# Patient Record
Sex: Female | Born: 1955
Health system: Southern US, Community
[De-identification: ages and names within clinical notes are randomized; demographics above are authoritative.]

## PROBLEM LIST (undated history)

## (undated) DIAGNOSIS — Z01419 Encounter for gynecological examination (general) (routine) without abnormal findings: Secondary | ICD-10-CM

## (undated) DIAGNOSIS — E785 Hyperlipidemia, unspecified: Secondary | ICD-10-CM

## (undated) DIAGNOSIS — H269 Unspecified cataract: Secondary | ICD-10-CM

## (undated) DIAGNOSIS — K219 Gastro-esophageal reflux disease without esophagitis: Secondary | ICD-10-CM

## (undated) DIAGNOSIS — I10 Essential (primary) hypertension: Secondary | ICD-10-CM

## (undated) DIAGNOSIS — E039 Hypothyroidism, unspecified: Secondary | ICD-10-CM

## (undated) DIAGNOSIS — I209 Angina pectoris, unspecified: Secondary | ICD-10-CM

## (undated) DIAGNOSIS — M1990 Unspecified osteoarthritis, unspecified site: Secondary | ICD-10-CM

## (undated) DIAGNOSIS — J449 Chronic obstructive pulmonary disease, unspecified: Secondary | ICD-10-CM

## (undated) DIAGNOSIS — I219 Acute myocardial infarction, unspecified: Secondary | ICD-10-CM

## (undated) DIAGNOSIS — D649 Anemia, unspecified: Secondary | ICD-10-CM

## (undated) DIAGNOSIS — T7840XA Allergy, unspecified, initial encounter: Secondary | ICD-10-CM

## (undated) DIAGNOSIS — J45909 Unspecified asthma, uncomplicated: Secondary | ICD-10-CM

## (undated) DIAGNOSIS — G43909 Migraine, unspecified, not intractable, without status migrainosus: Secondary | ICD-10-CM

## (undated) DIAGNOSIS — Z9889 Other specified postprocedural states: Secondary | ICD-10-CM

## (undated) DIAGNOSIS — I2510 Atherosclerotic heart disease of native coronary artery without angina pectoris: Secondary | ICD-10-CM

## (undated) HISTORY — DX: Encounter for gynecological examination (general) (routine) without abnormal findings: Z01.419

## (undated) HISTORY — DX: Allergy, unspecified, initial encounter: T78.40XA

## (undated) HISTORY — DX: Gastro-esophageal reflux disease without esophagitis: K21.9

## (undated) HISTORY — DX: Hypothyroidism, unspecified: E03.9

## (undated) HISTORY — DX: Essential (primary) hypertension: I10

## (undated) HISTORY — DX: Other specified postprocedural states: Z98.890

## (undated) HISTORY — PX: TONSILLECTOMY: SUR1361

## (undated) HISTORY — DX: Hyperlipidemia, unspecified: E78.5

## (undated) HISTORY — PX: HERNIA REPAIR: SHX51

## (undated) HISTORY — DX: Atherosclerotic heart disease of native coronary artery without angina pectoris: I25.10

## (undated) HISTORY — DX: Unspecified asthma, uncomplicated: J45.909

## (undated) HISTORY — DX: Unspecified cataract: H26.9

## (undated) HISTORY — PX: UPPER GASTROINTESTINAL ENDOSCOPY: SHX188

## (undated) HISTORY — DX: Anemia, unspecified: D64.9

## (undated) HISTORY — PX: THYROIDECTOMY: SHX17

## (undated) HISTORY — DX: Unspecified osteoarthritis, unspecified site: M19.90

---

## 1999-01-29 DIAGNOSIS — D649 Anemia, unspecified: Secondary | ICD-10-CM

## 1999-01-29 HISTORY — DX: Anemia, unspecified: D64.9

## 2003-12-28 ENCOUNTER — Ambulatory Visit: Payer: Self-pay | Admitting: Family Medicine

## 2004-09-27 ENCOUNTER — Ambulatory Visit: Payer: Self-pay | Admitting: Family Medicine

## 2004-10-01 ENCOUNTER — Inpatient Hospital Stay (HOSPITAL_COMMUNITY): Admission: EM | Admit: 2004-10-01 | Discharge: 2004-10-04 | Payer: Self-pay | Admitting: Emergency Medicine

## 2004-10-24 ENCOUNTER — Ambulatory Visit: Payer: Self-pay | Admitting: Family Medicine

## 2004-12-06 ENCOUNTER — Other Ambulatory Visit: Admission: RE | Admit: 2004-12-06 | Discharge: 2004-12-06 | Payer: Self-pay | Admitting: Obstetrics and Gynecology

## 2005-01-18 ENCOUNTER — Encounter: Admission: RE | Admit: 2005-01-18 | Discharge: 2005-01-18 | Payer: Self-pay | Admitting: Obstetrics and Gynecology

## 2005-10-24 ENCOUNTER — Ambulatory Visit: Payer: Self-pay | Admitting: Family Medicine

## 2005-11-06 ENCOUNTER — Ambulatory Visit: Payer: Self-pay | Admitting: Family Medicine

## 2005-11-08 ENCOUNTER — Ambulatory Visit: Payer: Self-pay | Admitting: Cardiology

## 2005-12-10 ENCOUNTER — Other Ambulatory Visit: Admission: RE | Admit: 2005-12-10 | Discharge: 2005-12-10 | Payer: Self-pay | Admitting: Obstetrics and Gynecology

## 2006-01-24 ENCOUNTER — Encounter: Admission: RE | Admit: 2006-01-24 | Discharge: 2006-01-24 | Payer: Self-pay | Admitting: Obstetrics and Gynecology

## 2006-07-20 ENCOUNTER — Emergency Department (HOSPITAL_COMMUNITY): Admission: EM | Admit: 2006-07-20 | Discharge: 2006-07-20 | Payer: Self-pay | Admitting: Family Medicine

## 2006-09-25 DIAGNOSIS — E039 Hypothyroidism, unspecified: Secondary | ICD-10-CM | POA: Insufficient documentation

## 2006-09-25 DIAGNOSIS — E7800 Pure hypercholesterolemia, unspecified: Secondary | ICD-10-CM | POA: Insufficient documentation

## 2006-09-25 DIAGNOSIS — K219 Gastro-esophageal reflux disease without esophagitis: Secondary | ICD-10-CM | POA: Insufficient documentation

## 2006-09-25 DIAGNOSIS — J309 Allergic rhinitis, unspecified: Secondary | ICD-10-CM | POA: Insufficient documentation

## 2006-09-25 DIAGNOSIS — I10 Essential (primary) hypertension: Secondary | ICD-10-CM | POA: Insufficient documentation

## 2006-11-10 ENCOUNTER — Ambulatory Visit: Payer: Self-pay | Admitting: Family Medicine

## 2006-11-10 DIAGNOSIS — R519 Headache, unspecified: Secondary | ICD-10-CM | POA: Insufficient documentation

## 2006-11-10 DIAGNOSIS — R51 Headache: Secondary | ICD-10-CM | POA: Insufficient documentation

## 2006-11-10 DIAGNOSIS — N951 Menopausal and female climacteric states: Secondary | ICD-10-CM | POA: Insufficient documentation

## 2006-11-10 DIAGNOSIS — D649 Anemia, unspecified: Secondary | ICD-10-CM | POA: Insufficient documentation

## 2006-11-12 LAB — CONVERTED CEMR LAB
Alkaline Phosphatase: 74 units/L (ref 39–117)
BUN: 7 mg/dL (ref 6–23)
Basophils Absolute: 0.1 10*3/uL (ref 0.0–0.1)
CO2: 30 meq/L (ref 19–32)
Cholesterol: 162 mg/dL (ref 0–200)
FSH: 38.2 milliintl units/mL
GFR calc Af Amer: 86 mL/min
HDL: 49.9 mg/dL (ref >39.0)
Hemoglobin: 13.9 g/dL (ref 12.0–15.0)
LH: 18.6 milliintl units/mL
Lymphocytes Relative: 25.4 % (ref 12.0–46.0)
MCHC: 35.7 g/dL (ref 30.0–36.0)
Monocytes Absolute: 0.6 10*3/uL (ref 0.2–0.7)
Monocytes Relative: 8.7 % (ref 3.0–11.0)
Neutro Abs: 4 10*3/uL (ref 1.4–7.7)
Potassium: 4.6 meq/L (ref 3.5–5.1)
Sodium: 145 meq/L (ref 135–145)
TSH: 0.53 microintl units/mL (ref 0.35–5.50)
Total Protein: 6.4 g/dL (ref 6.0–8.3)

## 2006-11-18 ENCOUNTER — Ambulatory Visit: Payer: Self-pay | Admitting: Cardiovascular Disease

## 2006-12-18 ENCOUNTER — Other Ambulatory Visit: Admission: RE | Admit: 2006-12-18 | Discharge: 2006-12-18 | Payer: Self-pay | Admitting: Obstetrics and Gynecology

## 2007-01-25 ENCOUNTER — Emergency Department (HOSPITAL_COMMUNITY): Admission: EM | Admit: 2007-01-25 | Discharge: 2007-01-25 | Payer: Self-pay | Admitting: Emergency Medicine

## 2007-01-29 HISTORY — PX: CARDIAC CATHETERIZATION: SHX172

## 2007-03-16 ENCOUNTER — Encounter: Admission: RE | Admit: 2007-03-16 | Discharge: 2007-03-16 | Payer: Self-pay | Admitting: Obstetrics and Gynecology

## 2007-07-22 ENCOUNTER — Other Ambulatory Visit: Payer: Self-pay

## 2007-07-22 ENCOUNTER — Encounter: Payer: Self-pay | Admitting: Family Medicine

## 2007-07-23 ENCOUNTER — Encounter: Payer: Self-pay | Admitting: Family Medicine

## 2007-07-23 ENCOUNTER — Inpatient Hospital Stay: Payer: Self-pay | Admitting: Internal Medicine

## 2007-07-24 ENCOUNTER — Encounter: Payer: Self-pay | Admitting: Family Medicine

## 2007-07-24 DIAGNOSIS — Z9889 Other specified postprocedural states: Secondary | ICD-10-CM

## 2007-07-24 HISTORY — DX: Other specified postprocedural states: Z98.890

## 2007-07-30 ENCOUNTER — Ambulatory Visit: Payer: Self-pay | Admitting: Family Medicine

## 2007-07-30 DIAGNOSIS — I2510 Atherosclerotic heart disease of native coronary artery without angina pectoris: Secondary | ICD-10-CM | POA: Insufficient documentation

## 2007-08-04 LAB — CONVERTED CEMR LAB: TSH: 0.58 microintl units/mL (ref 0.35–5.50)

## 2007-10-08 ENCOUNTER — Ambulatory Visit: Payer: Self-pay | Admitting: Obstetrics and Gynecology

## 2007-10-13 ENCOUNTER — Ambulatory Visit: Payer: Self-pay | Admitting: Obstetrics and Gynecology

## 2008-02-24 ENCOUNTER — Telehealth: Payer: Self-pay | Admitting: Family Medicine

## 2008-04-21 ENCOUNTER — Encounter: Admission: RE | Admit: 2008-04-21 | Discharge: 2008-04-21 | Payer: Self-pay | Admitting: Obstetrics and Gynecology

## 2008-07-18 ENCOUNTER — Emergency Department (HOSPITAL_COMMUNITY): Admission: EM | Admit: 2008-07-18 | Discharge: 2008-07-18 | Payer: Self-pay | Admitting: Emergency Medicine

## 2008-08-30 ENCOUNTER — Ambulatory Visit: Payer: Self-pay | Admitting: Family Medicine

## 2008-08-30 LAB — CONVERTED CEMR LAB
Blood in Urine, dipstick: NEGATIVE
Nitrite: NEGATIVE
Protein, U semiquant: NEGATIVE
Specific Gravity, Urine: 1.02
Urobilinogen, UA: 0.2
WBC Urine, dipstick: NEGATIVE

## 2008-09-01 LAB — CONVERTED CEMR LAB
AST: 18 units/L (ref 0–37)
Albumin: 4.1 g/dL (ref 3.5–5.2)
Alkaline Phosphatase: 63 units/L (ref 39–117)
Basophils Relative: 1.1 % (ref 0.0–3.0)
Bilirubin, Direct: 0.1 mg/dL (ref 0.0–0.3)
Calcium: 8.8 mg/dL (ref 8.4–10.5)
Creatinine, Ser: 1 mg/dL (ref 0.4–1.2)
GFR calc non Af Amer: 62.22 mL/min (ref >60)
HDL: 39.5 mg/dL (ref >39.00)
Hemoglobin: 14 g/dL (ref 12.0–15.0)
LDL Cholesterol: 89 mg/dL (ref 0–99)
Lymphocytes Relative: 32.4 % (ref 12.0–46.0)
Monocytes Relative: 9.6 % (ref 3.0–12.0)
Neutro Abs: 2.5 10*3/uL (ref 1.4–7.7)
Neutrophils Relative %: 53.9 % (ref 43.0–77.0)
RBC: 4.7 M/uL (ref 3.87–5.11)
Sodium: 143 meq/L (ref 135–145)
Total CHOL/HDL Ratio: 4
Total Protein: 7 g/dL (ref 6.0–8.3)
Triglycerides: 113 mg/dL (ref 0.0–149.0)
VLDL: 22.6 mg/dL (ref 0.0–40.0)
WBC: 4.5 10*3/uL (ref 4.5–10.5)

## 2008-09-12 ENCOUNTER — Ambulatory Visit: Payer: Self-pay | Admitting: Family Medicine

## 2008-09-12 DIAGNOSIS — D179 Benign lipomatous neoplasm, unspecified: Secondary | ICD-10-CM | POA: Insufficient documentation

## 2008-09-21 ENCOUNTER — Ambulatory Visit: Payer: Self-pay | Admitting: Internal Medicine

## 2008-09-27 ENCOUNTER — Encounter: Payer: Self-pay | Admitting: Family Medicine

## 2008-10-06 ENCOUNTER — Ambulatory Visit: Payer: Self-pay | Admitting: Internal Medicine

## 2008-10-06 ENCOUNTER — Encounter: Payer: Self-pay | Admitting: Internal Medicine

## 2008-10-06 HISTORY — PX: COLONOSCOPY: SHX174

## 2008-10-06 LAB — HM COLONOSCOPY

## 2008-10-07 ENCOUNTER — Encounter: Payer: Self-pay | Admitting: Internal Medicine

## 2008-11-02 ENCOUNTER — Encounter: Admission: RE | Admit: 2008-11-02 | Discharge: 2008-11-02 | Payer: Self-pay | Admitting: General Surgery

## 2008-11-28 ENCOUNTER — Encounter (INDEPENDENT_AMBULATORY_CARE_PROVIDER_SITE_OTHER): Payer: Self-pay | Admitting: *Deleted

## 2009-04-11 ENCOUNTER — Telehealth: Payer: Self-pay | Admitting: Family Medicine

## 2009-04-24 ENCOUNTER — Encounter: Admission: RE | Admit: 2009-04-24 | Discharge: 2009-04-24 | Payer: Self-pay | Admitting: Obstetrics and Gynecology

## 2009-05-12 ENCOUNTER — Emergency Department (HOSPITAL_COMMUNITY): Admission: EM | Admit: 2009-05-12 | Discharge: 2009-05-12 | Payer: Self-pay | Admitting: Emergency Medicine

## 2009-05-26 ENCOUNTER — Ambulatory Visit: Payer: Self-pay | Admitting: Obstetrics and Gynecology

## 2009-05-26 ENCOUNTER — Other Ambulatory Visit: Admission: RE | Admit: 2009-05-26 | Discharge: 2009-05-26 | Payer: Self-pay | Admitting: Obstetrics and Gynecology

## 2009-09-15 ENCOUNTER — Ambulatory Visit: Payer: Self-pay | Admitting: Family Medicine

## 2009-09-15 LAB — CONVERTED CEMR LAB
Ketones, urine, test strip: NEGATIVE
Nitrite: NEGATIVE
Protein, U semiquant: NEGATIVE
Urobilinogen, UA: 0.2

## 2009-09-18 LAB — CONVERTED CEMR LAB
ALT: 35 units/L (ref 0–35)
AST: 26 units/L (ref 0–37)
Alkaline Phosphatase: 75 units/L (ref 39–117)
BUN: 14 mg/dL (ref 6–23)
Basophils Relative: 0.9 % (ref 0.0–3.0)
Bilirubin, Direct: 0.1 mg/dL (ref 0.0–0.3)
Calcium: 8.8 mg/dL (ref 8.4–10.5)
Chloride: 104 meq/L (ref 96–112)
Cholesterol: 185 mg/dL (ref 0–200)
Creatinine, Ser: 1.1 mg/dL (ref 0.4–1.2)
Eosinophils Relative: 4.2 % (ref 0.0–5.0)
GFR calc non Af Amer: 58.57 mL/min (ref >60)
LDL Cholesterol: 112 mg/dL — ABNORMAL HIGH (ref 0–99)
Lymphocytes Relative: 27.5 % (ref 12.0–46.0)
Monocytes Absolute: 0.6 10*3/uL (ref 0.1–1.0)
Monocytes Relative: 9.1 % (ref 3.0–12.0)
Neutrophils Relative %: 58.3 % (ref 43.0–77.0)
Platelets: 227 10*3/uL (ref 150.0–400.0)
RBC: 4.42 M/uL (ref 3.87–5.11)
Total Bilirubin: 0.5 mg/dL (ref 0.3–1.2)
Total CHOL/HDL Ratio: 3
Total Protein: 7 g/dL (ref 6.0–8.3)
Triglycerides: 100 mg/dL (ref 0.0–149.0)
VLDL: 20 mg/dL (ref 0.0–40.0)
WBC: 6.1 10*3/uL (ref 4.5–10.5)

## 2009-09-25 ENCOUNTER — Ambulatory Visit: Payer: Self-pay | Admitting: Family Medicine

## 2010-02-18 ENCOUNTER — Encounter: Payer: Self-pay | Admitting: Family Medicine

## 2010-02-18 ENCOUNTER — Encounter: Payer: Self-pay | Admitting: Obstetrics and Gynecology

## 2010-02-27 NOTE — Progress Notes (Signed)
Summary: Pt req refill of Restoril 5m to [facility] Phone Note Call from Patient Call back at Home Phone [phone]  Caller: Patient Summary of Call: Pt req refill of Restoril 3104mto [facility] Initial call taken by: CaBraulio Bosch April 11, 2009 2:59 PM  Follow-up for Phone Call        done Follow-up by: StLaurey MoraleD,  April 12, 2009 8:36 AM  Additional Follow-up for Phone Call Additional follow up Details #1::        [facility] Follow-up by: JuChipper OmanRN,  April 12, 2009 8:48 AM    Prescriptions: RESTORIL 30 MG  CAPS (TEMAZEPAM) 1 by mouth once daily  #90 x 1   Entered and Authorized by:   StLaurey MoraleD   Signed by:   StLaurey MoraleD on 04/12/2009   Method used:   Print then Give to Patient   RxID:   [id number]

## 2010-02-27 NOTE — Assessment & Plan Note (Signed)
Summary: CPX // RS   Vital Signs:  Patient profile:   55 year old female Weight:      294 pounds BMI:     45.53 BP sitting:   110 / 80  (left arm) Cuff size:   large  Vitals Entered By: Chipper Oman, RN (September 25, 2009 2:39 PM) CC: CPX, labs done. Sees gyn. C/o tingling R fingers and feels tired.   History of Present Illness: 55 yr old female for a cpx. She feels well except for some mild fatigue. She admits to getting no exercise at all.   Allergies: 1)  ! Sulfamethoxazole (Sulfamethoxazole)  Past History:  Past Medical History: Reviewed history from 09/12/2008 and no changes required. Allergic rhinitis, gets shots per Dr. Caprice Red GERD Hyperlipidemia Hypertension Hypothyroidism Anemia-NOS Headache Coronary artery disease, had MI in 2001, sees Dr[facility] Fletcher [facility] at [facility] cardiac cath 07-24-07 showed nonocclusive disease sees Dr. Cherylann Banas for GYN exams  Past Surgical History: Reviewed history from 11/10/2006 and no changes required. Caesarean section 1984  Ventral Hernia repair  Thyroidectomy Tonsillectomy  Family History: Reviewed history from 11/10/2006 and no changes required. Family History Breast cancer 1st degree relative <50 Family History of Colon CA 1st degree relative <60 Family History High cholesterol Family History Hypertension Family History Lung cancer Family History of Prostate CA 1st degree relative <50 Family History of Stroke F 1st degree relative <60 Family History of Cardiovascular disorder Family History of ETOH  Social History: Reviewed history from 11/10/2006 and no changes required. Divorced Former Smoker Alcohol use-no Drug use-no  Review of Systems  The patient denies anorexia, fever, weight loss, vision loss, decreased hearing, hoarseness, chest pain, syncope, dyspnea on exertion, peripheral edema, prolonged cough, headaches, hemoptysis, abdominal pain, melena, hematochezia, severe indigestion/heartburn,  hematuria, incontinence, genital sores, muscle weakness, suspicious skin lesions, transient blindness, difficulty walking, depression, unusual weight change, abnormal bleeding, enlarged lymph nodes, angioedema, breast masses, and testicular masses.    Physical Exam  General:  overweight-appearing.   Head:  Normocephalic and atraumatic without obvious abnormalities. No apparent alopecia or balding. Eyes:  No corneal or conjunctival inflammation noted. EOMI. Perrla. Funduscopic exam benign, without hemorrhages, exudates or papilledema. Vision grossly normal. Ears:  External ear exam shows no significant lesions or deformities.  Otoscopic examination reveals clear canals, tympanic membranes are intact bilaterally without bulging, retraction, inflammation or discharge. Hearing is grossly normal bilaterally. Nose:  External nasal examination shows no deformity or inflammation. Nasal mucosa are pink and moist without lesions or exudates. Mouth:  Oral mucosa and oropharynx without lesions or exudates.  Teeth in good repair. Neck:  No deformities, masses, or tenderness noted. Chest Wall:  No deformities, masses, or tenderness noted. Lungs:  Normal respiratory effort, chest expands symmetrically. Lungs are clear to auscultation, no crackles or wheezes. Heart:  Normal rate and regular rhythm. S1 and S2 normal without gallop, murmur, click, rub or other extra sounds. EKG normal Abdomen:  Bowel sounds positive,abdomen soft and non-tender without masses, organomegaly or hernias noted. Msk:  No deformity or scoliosis noted of thoracic or lumbar spine.   Pulses:  R and L carotid,radial,femoral,dorsalis pedis and posterior tibial pulses are full and equal bilaterally Extremities:  No clubbing, cyanosis, edema, or deformity noted with normal full range of motion of all joints.   Neurologic:  No cranial nerve deficits noted. Station and gait are normal. Plantar reflexes are down-going bilaterally. DTRs are  symmetrical throughout. Sensory, motor and coordinative functions appear intact. Skin:  Intact without suspicious  lesions or rashes Cervical Nodes:  No lymphadenopathy noted Axillary Nodes:  No palpable lymphadenopathy Inguinal Nodes:  No significant adenopathy Psych:  Cognition and judgment appear intact. Alert and cooperative with normal attention span and concentration. No apparent delusions, illusions, hallucinations   Impression & Recommendations:  Problem # 1:  EXAMINATION, ROUTINE MEDICAL (ICD-V70.0)  Orders: EKG w/ Interpretation (93000)  Complete Medication List: 1)  Lipitor 20 Mg Tabs (Atorvastatin calcium) .... Take 1 tablet by mouth once a day 2)  Allegra 180 Mg Tabs (Fexofenadine hcl) .... Take 1 tablet by mouth once a day 3)  Singulair 10 Mg Tabs (Montelukast sodium) .... Take 1 tablet by mouth once a day 4)  Vitamin E Natural 400 Unit Caps (Vitamin e) .Marland Kitchen.. 1 by mouth once daily 5)  Aspirin 325 Mg Tbec (Aspirin) .... Once daily 6)  Calcium Antacid Ultra 1000 Mg Chew (Calcium carbonate antacid) .... Once daily 7)  Nexium 40 Mg Cpdr (Esomeprazole magnesium) .... Take 1 capsule by mouth once a day 8)  Nasacort Aq 55 Mcg/act Aers (Triamcinolone acetonide(nasal)) .... As needed 9)  Restoril 30 Mg Caps (Temazepam) .Marland Kitchen.. 1 by mouth once daily 10)  Prozac 40 Mg Caps (Fluoxetine hcl) .... Once daily 11)  Synthroid 200 Mcg Tabs (Levothyroxine sodium) .Marland Kitchen.. 1 by mouth once daily 12)  Cozaar 100 Mg Tabs (Losartan potassium) .... Once daily 13)  Proair Hfa 108 (90 Base) Mcg/act Aers (Albuterol sulfate) .... 2 inh q4h as needed shortness of breath  Patient Instructions: 1)  It is important that you exercise reguarly at least 20 minutes 5 times a week. If you develop chest pain, have severe difficulty breathing, or feel very tired, stop exercising immediately and seek medical attention.  2)  You need to lose weight. Consider a lower calorie diet and regular exercise.  3)  Please  schedule a follow-up appointment in 6 months .  Prescriptions: PROAIR HFA 108 (90 BASE) MCG/ACT  AERS (ALBUTEROL SULFATE) 2 inh q4h as needed shortness of breath  #3 x 3   Entered and Authorized by:   Laurey Morale MD   Signed by:   Laurey Morale MD on 09/25/2009   Method used:   Print then Give to Patient   RxID:   [id number] COZAAR 100 MG  TABS (LOSARTAN POTASSIUM) once daily  #90 x 3   Entered and Authorized by:   Laurey Morale MD   Signed by:   Laurey Morale MD on 09/25/2009   Method used:   Print then Give to Patient   RxID:   [id number] SYNTHROID 200 MCG TABS (LEVOTHYROXINE SODIUM) 1 by mouth once daily  #90 x 3   Entered and Authorized by:   Laurey Morale MD   Signed by:   Laurey Morale MD on 09/25/2009   Method used:   Print then Give to Patient   RxID:   [id number] PROZAC 40 MG  CAPS (FLUOXETINE HCL) once daily  #90 x 3   Entered and Authorized by:   Laurey Morale MD   Signed by:   Laurey Morale MD on 09/25/2009   Method used:   Print then Give to Patient   RxID:   [id number] RESTORIL 30 MG  CAPS (TEMAZEPAM) 1 by mouth once daily  #90 x 1   Entered and Authorized by:   Laurey Morale MD   Signed by:   Laurey Morale MD on 09/25/2009   Method used:   Print then  Give to Patient   RxID:   [id number] NEXIUM 40 MG  CPDR (ESOMEPRAZOLE MAGNESIUM) Take 1 capsule by mouth once a day  #90 x 3   Entered and Authorized by:   Laurey Morale MD   Signed by:   Laurey Morale MD on 09/25/2009   Method used:   Print then Give to Patient   RxID:   [id number] SINGULAIR 10 MG  TABS (MONTELUKAST SODIUM) Take 1 tablet by mouth once a day  #90 x 3   Entered and Authorized by:   Laurey Morale MD   Signed by:   Laurey Morale MD on 09/25/2009   Method used:   Print then Give to Patient   RxID:   [id number] ALLEGRA 180 MG  TABS (FEXOFENADINE HCL) Take 1 tablet by mouth once a day  #90 x 3   Entered and Authorized by:   Laurey Morale MD   Signed by:   Laurey Morale MD on 09/25/2009   Method used:   Print then Give to Patient   RxID:   [id number] LIPITOR 20 MG  TABS (ATORVASTATIN CALCIUM) Take 1 tablet by mouth once a day  #90 x 3   Entered and Authorized by:   Laurey Morale MD   Signed by:   Laurey Morale MD on 09/25/2009   Method used:   Print then Give to Patient   RxID:   [id number]

## 2010-04-24 ENCOUNTER — Other Ambulatory Visit: Payer: Self-pay | Admitting: Obstetrics and Gynecology

## 2010-04-24 DIAGNOSIS — Z1231 Encounter for screening mammogram for malignant neoplasm of breast: Secondary | ICD-10-CM

## 2010-05-07 ENCOUNTER — Ambulatory Visit
Admission: RE | Admit: 2010-05-07 | Discharge: 2010-05-07 | Disposition: A | Discharge status: Home / Self Care | Payer: BC Managed Care – PPO | Source: Ambulatory Visit | Attending: Obstetrics and Gynecology | Admitting: Obstetrics and Gynecology

## 2010-05-07 DIAGNOSIS — Z1231 Encounter for screening mammogram for malignant neoplasm of breast: Secondary | ICD-10-CM

## 2010-05-28 ENCOUNTER — Inpatient Hospital Stay (INDEPENDENT_AMBULATORY_CARE_PROVIDER_SITE_OTHER)
Admission: RE | Admit: 2010-05-28 | Discharge: 2010-05-28 | Disposition: A | Discharge status: Home / Self Care | Payer: BC Managed Care – PPO | Source: Ambulatory Visit | Attending: Family Medicine | Admitting: Family Medicine

## 2010-05-28 ENCOUNTER — Ambulatory Visit (INDEPENDENT_AMBULATORY_CARE_PROVIDER_SITE_OTHER): Payer: BC Managed Care – PPO

## 2010-05-28 DIAGNOSIS — M542 Cervicalgia: Secondary | ICD-10-CM

## 2010-05-30 ENCOUNTER — Other Ambulatory Visit: Payer: Self-pay | Admitting: Women's Health

## 2010-05-30 ENCOUNTER — Encounter (INDEPENDENT_AMBULATORY_CARE_PROVIDER_SITE_OTHER): Payer: BC Managed Care – PPO | Admitting: Women's Health

## 2010-05-30 ENCOUNTER — Other Ambulatory Visit (HOSPITAL_COMMUNITY)
Admission: RE | Admit: 2010-05-30 | Discharge: 2010-05-30 | Disposition: A | Discharge status: Home / Self Care | Payer: BC Managed Care – PPO | Source: Ambulatory Visit | Attending: Obstetrics and Gynecology | Admitting: Obstetrics and Gynecology

## 2010-05-30 DIAGNOSIS — Z01419 Encounter for gynecological examination (general) (routine) without abnormal findings: Secondary | ICD-10-CM

## 2010-05-30 DIAGNOSIS — Z124 Encounter for screening for malignant neoplasm of cervix: Secondary | ICD-10-CM | POA: Insufficient documentation

## 2010-06-07 ENCOUNTER — Other Ambulatory Visit: Payer: Self-pay | Admitting: Family Medicine

## 2010-06-07 MED ORDER — TEMAZEPAM 30 MG PO CAPS: 30.0000 mg | ORAL_CAPSULE | Freq: Every evening | ORAL | Status: DC | PRN

## 2010-06-07 NOTE — Telephone Encounter (Signed)
Rx Done.

## 2010-06-07 NOTE — Telephone Encounter (Signed)
Refill Restoril to [facility].

## 2010-06-07 NOTE — Telephone Encounter (Signed)
This is for Temazepam 30 mg qhs. Call in #90 with one rf to [facility]

## 2010-06-15 NOTE — Op Note (Signed)
NAMEBREYANNA, Nancy [name]               ACCOUNT NO.:  [id number]   MEDICAL RECORD NO.:  [id number]          PATIENT TYPE:  EMS   LOCATION:  ED                           FACILITY:  [facility]   PHYSICIAN:  Joyice Faster. Cornett, M.D.DATE OF BIRTH:  [birth year]   DATE OF PROCEDURE:  10/01/2004  DATE OF DISCHARGE:                                 OPERATIVE REPORT   PREOPERATIVE DIAGNOSIS:  Incarcerated incisional hernia.   POSTOPERATIVE DIAGNOSIS:  Incarcerated incisional hernia.   PROCEDURE:  Repair of incarcerated ventral hernia with sutures.   SURGEON:  Marcello Moores A. Cornett, M.D.   ANESTHESIA:  General endotracheal anesthesia.   ESTIMATED BLOOD LOSS:  10 cc.   SPECIMENS:  None.   DRAINS:  None.   INDICATIONS FOR PROCEDURE:  The patient is a 55 year old female who was  looking at television last week and felt a pop in her abdominal wall.  She  developed some swelling, redness, discomfort, and a hard knot.  She  underwent a CT scan which showed incarcerated omentum and an incisional  hernia from a previous C-section.  At this point, on examination, I agreed.  She also had significant skin changes over the hernia concerning for  incarceration.  I recommended an open repair of this for her and recommended  not using mesh in this setting due to the erythema overlying the  incarceration.  She had no other symptoms of obstruction.  After explanation  of the procedure, she agreed to proceed.   DESCRIPTION OF PROCEDURE:  The patient was brought to the operating room and  placed supine.  After induction of general endotracheal anesthesia, her  abdomen was prepped and draped in a sterile fashion.  There is a lower  midline scar noted, and this is where the hernia was.  The entire hernia  felt like it measured roughly 4 x 4 cm.  An incision was made.  Dissection  was carried down.  There was significant inflammatory stranding of the  subcutaneous fat around this hernia sac.  I peeled the fat away to  open the  sac.  I also had opened the fascia in both directions in order to be able to  manipulate the hernia sac and contents.  Once I opened the hernia sac, there  were some ischemic-appearing omentum, otherwise no bowel.  I amputated the  omentum with the cautery and made sure it was hemostatic.  I passed it back  into the abdominal cavity.  I opened the fascia about 2 cm superiorly so I  could get my fingers around and sweep.  There were no adhesions to the  anterior abdominal wall.  The fascia was thin at this point but was intact.  At this point in time, given the inflammatory changes of the abdominal wall,  I was hesitant to use mesh.  I felt primary closure, given the small size of  this defect, which measured 2-3 cm, was appropriate.  Interrupted 0 Prolene  were then used to approximate the fascia to close the defect.  Irrigation  was used and suctioned out.  The subcutaneous tissues were irrigated  as  well.  I then closed the subcu fat to destroy the dead space with a 2-  0 Vicryl.  Skin staples were used to close the skin.  Sterile dressings were  then applied.  All sponge, needle, and instrument counts were counted and  found to be correct at this portion of the case.  Patient was then awakened  and taken to the recovery room in satisfactory condition.      Thomas A. Cornett, M.D.  Electronically Signed     TAC/MEDQ  D:  10/01/2004  T:  10/01/2004  Job:  [id number]

## 2010-06-15 NOTE — Discharge Summary (Signed)
[name], [name]               ACCOUNT NO.:  [id number]   MEDICAL RECORD NO.:  [id number]          PATIENT TYPE:  INP   LOCATION:  1504                         FACILITY:  [facility]   PHYSICIAN:  Joyice Faster. Cornett, M.D.DATE OF BIRTH:  06/29/1955   DATE OF ADMISSION:  10/01/2004  DATE OF DISCHARGE:  10/04/2004                                 DISCHARGE SUMMARY   ADMISSION DIAGNOSIS:  Incarcerated ventral hernia.   POSTOPERATIVE DIAGNOSES:  1.  Incarcerated ventral hernia.  2.  Anemia.   PROCEDURE PERFORMED:  Open repair of incarcerated ventral hernia.   BRIEF HISTORY:  The patient is a 55 year old female admitted on October 01, 2004 with an incarcerated ventral hernia. She had some overlying erythema  and was taken to the operating room for exploration. This was done and she  had some incarcerated omentum that was reduced. After this was done, her  fascia was repaired with suture given the amount of erythema in her skin.  She was admitted afterwards.   HOSPITAL COURSE:  Her hospital course was complicated by a low grade fever  which resolved on postoperative day #2. Her erythema improved when she was  placed on doxicycline and changed from Ancef. She was wearing a corset and  was able to ambulate without difficulty. She was tolerating her diet and was  afebrile prior to discharge. On examination at discharge, her wound looked  much better with less erythema and the staples were intact with no drainage.  She was anemic down to 8.8 but she was admitted with a hemoglobin of 10.6  which was checked the next day and was 9.4 and then 48 hours 8.8. I feel  this is secondary to dilution on top of anemia at this point in time. Her  p.o. intake was improved. She felt well at discharge with no complaints. She  had much less pain in her incision and was ready to go home.   DISCHARGE INSTRUCTIONS:  The patient was discharged home on doxicycline 100  mg p.o. b.i.d. and Percocet for pain. She  will resume her preoperative  medications as before. I told her no heavy lifting and driving until I see  her back next week. She will followup in a week. She will shower and resume  her regular diet when she goes home.   CONDITION ON DISCHARGE:  Satisfactory.      Thomas A. Cornett, M.D.  Electronically Signed     TAC/MEDQ  D:  10/04/2004  T:  10/04/2004  Job:  [id number]

## 2010-06-15 NOTE — Assessment & Plan Note (Signed)
[facility] OFFICE NOTE   NAME:JARRELLTacara, Hadlock                        MRN:          [id number]  DATE:11/06/2005                            DOB:          11-05-1955    This is a 55 year old woman here for a non-gynecological physical  examination. In general, she is doing well. Her only complaint today is of  difficulty sleeping. She works the night shift and often has trouble  sleeping during the daylight hours. She would like to try something mild on  an as needed basis. As far as past medical history, she sees Dr. Cherylann Banas  on a regular basis for gynecology examinations. She was diagnosed with a  ventral hernia in September 2006, and underwent a successful surgical repair  of this per Dr. Brantley Stage. During work-up prior to this surgery, an abdominal  CT scan showed a nodule in the right lower lobe of her lungs. When I talked  to her on October 24, 2004, I talked about the advisability of getting a  follow up chest CT scan about three months from then and the patient agreed  to come in and let me set that up. Unfortunately, she never came back until  today, so no follow up was obtained. She continues to have no symptoms  regarding her lungs or chest. We have also been following her for a history  of anemia, of gastroesophageal reflux disease and hypothyroidism.   For other details for past medical history, family history, social history,  habits, etc., please refer to the introductory note with her dated December 28, 2003.   ALLERGIES:  SULFA.   CURRENT MEDICATIONS:  1. Synthroid 200 micrograms per day.  2. Cozaar 50 mg per day.  3. Lipitor 20 mg per day.  4. Fexofenadine 180 mg per day.  5. Singulair 10 mg per day.  6. Aspirin 325 mg per day.  7. Multivitamins including calcium daily.  8. Two over-the-counter iron pills per day.  9. Nexium 40 mg per day.  10.Midrin as needed for headaches.  11.Nasacort AQ sprays daily.   OBJECTIVE:  Height: 5 foot, 8 inches. Weight: 260. Blood pressure: 112/72.  Pulse: 84 and regular.  GENERAL: She remains obese.  SKIN: Is clear.  EYES: Clear.  OROPHARYNX: Clear.  NECK: Supple, without lymphadenopathy or masses.  LUNGS:  Clear.  CARDIAC: Regular rate and rhythm.  No gallops, murmurs or rubs. Distal  pulses are full.  ABDOMEN: Soft. Normal bowel sounds. Nontender. No masses.  EXTREMITIES: No clubbing, cyanosis or edema.  NEUROLOGIC: Is grossly intact.  She was here for fasting labs on September 27th. These were remarkable for a  normal TSH of 1.74, normal hemoglobin of 15.0 and a normal LDL of 91.   ASSESSMENT/PLAN:  1. Complete physical: We talked about increasing exercise and losing      weight.  2. Hypothyroidism, stable.  3. Hypertension, stable.  4. Hyperlipidemia, stable.  5. Allergies, stable.  6. Insomnia: Will try temazepam 30 mg q h.s. as needed. I wrote for #90  with three refills.  7. History of anemia, now well controlled. Will decrease her over-the-      counter iron pills to one tablet per day.  8. Follow up of right lower lobe nodule seen on CT scan one year ago. Will      set up a contrasted chest CT scan again in the near future for      comparison.            ______________________________  Ishmael Holter Sarajane Jews, MD     SAF/MedQ  DD:  11/06/2005  DT:  11/07/2005  Job #:  [id number]

## 2010-06-15 NOTE — H&P (Signed)
Nancy [name], Nancy [name]               ACCOUNT NO.:  [id number]   MEDICAL RECORD NO.:  [id number]          PATIENT TYPE:  EMS   LOCATION:  ED                           FACILITY:  [facility]   PHYSICIAN:  Joyice Faster. Cornett, M.D.DATE OF BIRTH:  09-07-1955   DATE OF ADMISSION:  10/01/2004  DATE OF DISCHARGE:                                HISTORY & PHYSICAL   CHIEF COMPLAINT:  Abdominal pain.   HISTORY OF PRESENT ILLNESS:  The patient is a 55 year old female with a  three-day history of progressive lower abdominal pain.  This started after  she was lifting a heavy TV on Wednesday.  The pain became worse and the area  became a bulge which became tender and hard.  Over the last 24 hours or so,  the area became red and more uncomfortable to the patient.  She denies any  nausea, vomiting, fever or chills.  The pain is located below her umbilicus  and just above her pubic symphysis.  She had previous C-section she states  many years ago.  Nothing makes the pain any better.   PAST MEDICAL HISTORY:  1.  Coronary artery disease status post myocardial infarction in the past.  2.  Hypertension.  3.  Hypercholesterolemia.  4.  Tobacco use.  5.  Gastroesophageal reflux disease.   PAST SURGICAL HISTORY:  History of C-section.   FAMILY HISTORY:  Positive for coronary artery disease and hypertension.   SOCIAL HISTORY:  Positive for tobacco use, negative for alcohol use.  She  works as a Statistician.   ALLERGIES:  SULFA and [place].   MEDICATIONS:  Synthroid, Cozaar, Lipitor, aspirin, ____vit e______, Allegra,  Singulair and Nexium.   REVIEW OF SYSTEMS:  As stated above, otherwise 10 review of systems are  negative.   PHYSICAL EXAMINATION:  GENERAL APPEARANCE:  A white female in no apparent  distress.  VITAL SIGNS:  Temperature 97, pulse 101, respiratory rate 20, blood pressure  119/72.  HEENT:  Extraocular movements are intact.  Pupils are equal, round and  reactive to light.   Oropharynx clear.  NECK:  Supple and nontender, no mass.  CHEST:  Clear to auscultation.  CARDIOVASCULAR:  Regular rate and rhythm.  ABDOMEN:  In the lower midline, there is an incision between the umbilicus  and pubic symphysis.  There is roughly a 3 to 4 cm painful bulge which is  nonreducible.  There is associated erythema with it as well.  No fluctuance.  EXTREMITIES:  No clubbing, cyanosis, or edema.  SKIN:  Otherwise normal.  NEUROLOGIC:  Otherwise normal.   LABORATORY STUDIES:  CT scan of her abdomen shows incarcerated ventral  hernia in her previous C-section incision with peritoneal fat and omentum.  No evidence of small-bowel contained in this.   White count of 6900, hemoglobin 10.6, platelet count 328,000.  Sodium 137,  potassium 3.9, chloride 104, carbon dioxide 25, glucose 102, BUN 8,  creatinine 0.8.  Her liver function studies are within normal limits.  Urinalysis is within normal limits.   IMPRESSION:  Incarcerated incisional hernia from previous cesarean section.  PLAN:  At this point in time, I feel Ms. Casasola needs exploration in the  operating room with repair of this.  I explained to her the increased risk  of recurrence and infection in this setting due to redness.  I am not sure I  will be able to use mesh in her but will see how big the defect is and try  to close it primarily in this setting given the erythema which is concerning  for infection.  I have explained the risk of bleeding and infection as well  as hernia recurrence which would be anywhere from 10 to 20% in light of  infection and without mesh.  She understands and agrees to proceed.  We will  go ahead and make arrangements for her to go to the operating room for this.      Thomas A. Cornett, M.D.  Electronically Signed     TAC/MEDQ  D:  10/01/2004  T:  10/01/2004  Job:  [id number]

## 2010-08-05 ENCOUNTER — Other Ambulatory Visit: Payer: Self-pay | Admitting: Family Medicine

## 2010-08-06 NOTE — Telephone Encounter (Signed)
rx sent to [facility]

## 2010-08-12 ENCOUNTER — Other Ambulatory Visit: Payer: Self-pay | Admitting: Family Medicine

## 2010-10-09 ENCOUNTER — Inpatient Hospital Stay (INDEPENDENT_AMBULATORY_CARE_PROVIDER_SITE_OTHER)
Admission: RE | Admit: 2010-10-09 | Discharge: 2010-10-09 | Disposition: A | Discharge status: Home / Self Care | Payer: BC Managed Care – PPO | Source: Ambulatory Visit | Attending: Family Medicine | Admitting: Family Medicine

## 2010-10-09 DIAGNOSIS — J309 Allergic rhinitis, unspecified: Secondary | ICD-10-CM

## 2010-10-09 DIAGNOSIS — J019 Acute sinusitis, unspecified: Secondary | ICD-10-CM

## 2010-10-18 ENCOUNTER — Encounter: Payer: Self-pay | Admitting: Family Medicine

## 2010-10-19 ENCOUNTER — Encounter: Payer: Self-pay | Admitting: Family Medicine

## 2010-10-19 ENCOUNTER — Ambulatory Visit (INDEPENDENT_AMBULATORY_CARE_PROVIDER_SITE_OTHER): Payer: BC Managed Care – PPO | Admitting: Family Medicine

## 2010-10-19 VITALS — BP 118/80 | HR 82 | Temp 98.6°F | Ht 67.0 in | Wt 276.0 lb

## 2010-10-19 DIAGNOSIS — E0590 Thyrotoxicosis, unspecified without thyrotoxic crisis or storm: Secondary | ICD-10-CM

## 2010-10-19 DIAGNOSIS — J45909 Unspecified asthma, uncomplicated: Secondary | ICD-10-CM

## 2010-10-19 DIAGNOSIS — I10 Essential (primary) hypertension: Secondary | ICD-10-CM

## 2010-10-19 DIAGNOSIS — E785 Hyperlipidemia, unspecified: Secondary | ICD-10-CM

## 2010-10-19 DIAGNOSIS — F411 Generalized anxiety disorder: Secondary | ICD-10-CM

## 2010-10-19 DIAGNOSIS — F419 Anxiety disorder, unspecified: Secondary | ICD-10-CM

## 2010-10-19 LAB — CBC WITH DIFFERENTIAL/PLATELET
Basophils Absolute: 0.1 10*3/uL (ref 0.0–0.1)
Eosinophils Absolute: 0.3 10*3/uL (ref 0.0–0.7)
Eosinophils Relative: 3.8 % (ref 0.0–5.0)
HCT: 41 % (ref 36.0–46.0)
Lymphs Abs: 1.9 10*3/uL (ref 0.7–4.0)
MCV: 87.4 fl (ref 78.0–100.0)
Monocytes Absolute: 0.6 10*3/uL (ref 0.1–1.0)
Neutrophils Relative %: 59.9 % (ref 43.0–77.0)
Platelets: 230 10*3/uL (ref 150.0–400.0)
RDW: 14.5 % (ref 11.5–14.6)
WBC: 7 10*3/uL (ref 4.5–10.5)

## 2010-10-19 LAB — LIPID PANEL
HDL: 62 mg/dL (ref >39.00)
Triglycerides: 110 mg/dL (ref 0.0–149.0)

## 2010-10-19 LAB — HEPATIC FUNCTION PANEL
ALT: 26 U/L (ref 0–35)
Albumin: 4.3 g/dL (ref 3.5–5.2)
Alkaline Phosphatase: 80 U/L (ref 39–117)
Total Protein: 7.5 g/dL (ref 6.0–8.3)

## 2010-10-19 LAB — BASIC METABOLIC PANEL
CO2: 27 mEq/L (ref 19–32)
Calcium: 8.6 mg/dL (ref 8.4–10.5)
Chloride: 104 mEq/L (ref 96–112)
Creatinine, Ser: 1 mg/dL (ref 0.4–1.2)
Glucose, Bld: 84 mg/dL (ref 70–99)

## 2010-10-19 LAB — POCT URINALYSIS DIPSTICK
Blood, UA: NEGATIVE
Glucose, UA: NEGATIVE
Nitrite, UA: NEGATIVE
Protein, UA: NEGATIVE
Urobilinogen, UA: 0.2

## 2010-10-19 MED ORDER — TEMAZEPAM 30 MG PO CAPS: 30.0000 mg | ORAL_CAPSULE | Freq: Every evening | ORAL | Status: DC | PRN

## 2010-10-19 MED ORDER — ESOMEPRAZOLE MAGNESIUM 40 MG PO CPDR: 40.0000 mg | DELAYED_RELEASE_CAPSULE | Freq: Every day | ORAL | Status: DC

## 2010-10-19 MED ORDER — LEVOTHYROXINE SODIUM 200 MCG PO TABS: 200.0000 ug | ORAL_TABLET | Freq: Every day | ORAL | Status: DC

## 2010-10-19 MED ORDER — ATORVASTATIN CALCIUM 20 MG PO TABS: 20.0000 mg | ORAL_TABLET | Freq: Every day | ORAL | Status: DC

## 2010-10-19 MED ORDER — LOSARTAN POTASSIUM 100 MG PO TABS: 100.0000 mg | ORAL_TABLET | Freq: Every day | ORAL | Status: DC

## 2010-10-19 MED ORDER — FLUOXETINE HCL 40 MG PO CAPS: 40.0000 mg | ORAL_CAPSULE | Freq: Every day | ORAL | Status: DC

## 2010-10-19 MED ORDER — ALBUTEROL SULFATE HFA 108 (90 BASE) MCG/ACT IN AERS: 2.0000 | INHALATION_SPRAY | RESPIRATORY_TRACT | Status: DC | PRN

## 2010-10-19 NOTE — Progress Notes (Signed)
Addended by: Alysia Penna A on: 10/19/2010 04:58 PM   Modules accepted: Orders

## 2010-10-19 NOTE — Progress Notes (Signed)
  Subjective:    Patient ID: Nancy [name], female    DOB: [birth year], 55 y.o.   MRN: [id number]  HPI Here to follow up after a year. She feels great and she has lost 18 lbs since we last saw her. No complaints. She is fasting.    Review of Systems  Respiratory: Negative.   Cardiovascular: Negative.        Objective:   Physical Exam  Constitutional: She appears well-developed and well-nourished.  Neck: No thyromegaly present.  Cardiovascular: Normal rate, regular rhythm, normal heart sounds and intact distal pulses.   Pulmonary/Chest: Effort normal and breath sounds normal.  Lymphadenopathy:    She has no cervical adenopathy.          Assessment & Plan:  Get labs. Refilled meds.

## 2010-10-22 ENCOUNTER — Telehealth: Payer: Self-pay | Admitting: Family Medicine

## 2010-10-22 NOTE — Telephone Encounter (Signed)
Message copied by Rudi Coco on Mon Oct 22, 2010  3:14 PM ------      Message from: Alysia Penna A      Created: Fri Oct 19, 2010  4:59 PM       Normal except she may have an overactive thyroid. I have ordered some additional tests to figure this out (free T3 and free T4) which should be back next week. We will call her when they return

## 2010-10-22 NOTE — Telephone Encounter (Signed)
Spoke with pt and gave results.

## 2011-03-06 ENCOUNTER — Encounter: Payer: Self-pay | Admitting: Family Medicine

## 2011-03-06 ENCOUNTER — Ambulatory Visit (INDEPENDENT_AMBULATORY_CARE_PROVIDER_SITE_OTHER): Payer: BC Managed Care – PPO | Admitting: Family Medicine

## 2011-03-06 VITALS — BP 120/76 | HR 63 | Temp 98.7°F | Ht 68.5 in | Wt 287.0 lb

## 2011-03-06 DIAGNOSIS — I10 Essential (primary) hypertension: Secondary | ICD-10-CM

## 2011-03-06 DIAGNOSIS — Z0000 Encounter for general adult medical examination without abnormal findings: Secondary | ICD-10-CM

## 2011-03-06 DIAGNOSIS — E785 Hyperlipidemia, unspecified: Secondary | ICD-10-CM

## 2011-03-06 DIAGNOSIS — E039 Hypothyroidism, unspecified: Secondary | ICD-10-CM

## 2011-03-06 LAB — LIPID PANEL
HDL: 50.9 mg/dL (ref >39.00)
LDL Cholesterol: 78 mg/dL (ref 0–99)
Total CHOL/HDL Ratio: 3
VLDL: 28.8 mg/dL (ref 0.0–40.0)

## 2011-03-06 LAB — BASIC METABOLIC PANEL
CO2: 28 mEq/L (ref 19–32)
Calcium: 8.5 mg/dL (ref 8.4–10.5)
Creatinine, Ser: 0.9 mg/dL (ref 0.4–1.2)
GFR: 66.99 mL/min (ref >60.00)
Sodium: 140 mEq/L (ref 135–145)

## 2011-03-06 LAB — T3, FREE: T3, Free: 2.7 pg/mL (ref 2.3–4.2)

## 2011-03-06 LAB — TSH: TSH: 0.13 u[IU]/mL — ABNORMAL LOW (ref 0.35–5.50)

## 2011-03-06 LAB — T4, FREE: Free T4: 1.15 ng/dL (ref 0.60–1.60)

## 2011-03-06 MED ORDER — TEMAZEPAM 30 MG PO CAPS: 30.0000 mg | ORAL_CAPSULE | Freq: Every evening | ORAL | Status: DC | PRN

## 2011-03-06 NOTE — Progress Notes (Signed)
  Subjective:    Patient ID: Nancy [name], female    DOB: 1955-11-23, 56 y.o.   MRN: [id number]  HPI Here to follow up on hypothyroidism, HTN, and lipids. Also her insurance company wants her to participate in a wellness program. She feels fine and has no concerns.    Review of Systems  Constitutional: Negative.   Respiratory: Negative.   Cardiovascular: Negative.        Objective:   Physical Exam  Constitutional: She appears well-developed and well-nourished.  Neck: No thyromegaly present.  Cardiovascular: Normal rate, regular rhythm, normal heart sounds and intact distal pulses.   Pulmonary/Chest: Effort normal and breath sounds normal.  Lymphadenopathy:    She has no cervical adenopathy.          Assessment & Plan:  She seems to be doing well. Get labs today.

## 2011-03-08 ENCOUNTER — Encounter: Payer: Self-pay | Admitting: Family Medicine

## 2011-03-08 NOTE — Progress Notes (Signed)
Quick Note:  I tried to reach pt, no answer. I put a copy of results in mail. ______

## 2011-03-12 NOTE — Progress Notes (Signed)
Quick Note:  Left voice message ______

## 2011-04-18 ENCOUNTER — Other Ambulatory Visit: Payer: Self-pay | Admitting: Obstetrics and Gynecology

## 2011-04-18 DIAGNOSIS — Z1231 Encounter for screening mammogram for malignant neoplasm of breast: Secondary | ICD-10-CM

## 2011-05-10 ENCOUNTER — Ambulatory Visit
Admission: RE | Admit: 2011-05-10 | Discharge: 2011-05-10 | Disposition: A | Discharge status: Home / Self Care | Payer: BC Managed Care – PPO | Source: Ambulatory Visit | Attending: Obstetrics and Gynecology | Admitting: Obstetrics and Gynecology

## 2011-05-10 DIAGNOSIS — Z1231 Encounter for screening mammogram for malignant neoplasm of breast: Secondary | ICD-10-CM

## 2011-06-23 ENCOUNTER — Encounter (HOSPITAL_COMMUNITY): Payer: Self-pay

## 2011-06-23 ENCOUNTER — Emergency Department (HOSPITAL_COMMUNITY)
Admission: EM | Admit: 2011-06-23 | Discharge: 2011-06-23 | Disposition: A | Discharge status: Home Health | Payer: BC Managed Care – PPO | Source: Home / Self Care | Attending: Emergency Medicine | Admitting: Emergency Medicine

## 2011-06-23 DIAGNOSIS — R1110 Vomiting, unspecified: Secondary | ICD-10-CM

## 2011-06-23 DIAGNOSIS — E860 Dehydration: Secondary | ICD-10-CM

## 2011-06-23 DIAGNOSIS — R197 Diarrhea, unspecified: Secondary | ICD-10-CM

## 2011-06-23 MED ORDER — ORALYTE PO SOLN: 2.0000 L | Freq: Two times a day (BID) | ORAL | Status: DC

## 2011-06-23 MED ORDER — DIPHENOXYLATE-ATROPINE 2.5-0.025 MG PO TABS: 1.0000 | ORAL_TABLET | Freq: Four times a day (QID) | ORAL | Status: AC | PRN

## 2011-06-23 MED ORDER — ONDANSETRON 4 MG PO TBDP
4.0000 mg | ORAL_TABLET | Freq: Once | ORAL | Status: AC
  Administered 2011-06-23: 4 mg via ORAL

## 2011-06-23 MED ORDER — ONDANSETRON HCL 4 MG PO TABS: 4.0000 mg | ORAL_TABLET | Freq: Three times a day (TID) | ORAL | Status: AC | PRN

## 2011-06-23 MED ORDER — ONDANSETRON 4 MG PO TBDP
ORAL_TABLET | ORAL | Status: AC
  Filled 2011-06-23: qty 1

## 2011-06-23 NOTE — Discharge Instructions (Signed)
As discussed during your exam, take this to medicines for your diarrhea and vomiting if necessary. Try to rehydrate herself for the next 6-8 hours with 2 L of oralyte or equivalent. Take your symptoms might be related to a viral gastrointestinal infection. Should be much improved next 48 hours. We also discussed what symptoms should warrant further evaluation members apartment. Such as localize abdominal pain to right lower or upper abdominal regions. Unable to drink any fluids without vomiting despite prescribed medicines.   Clear Liquid Diet The clear liquid dietconsists of foods that are liquid or will become liquid at room temperature.You should be able to see through the liquid and beverages. Examples of foods allowed on a clear liquid diet include fruit juice, broth or bouillon, gelatin, or frozen ice pops. The purpose of this diet is to provide necessary fluid, electrolytes such as sodium and potassium, and energy to keep the body functioning during times when you are not able to consume a regular diet.A clear liquid diet should not be continued for long periods of time as it is not nutritionally adequate.  REASONS FOR USING A CLEAR LIQUID DIET  In sudden onset (acute) conditions for a patient before or after surgery.   As the first step in oral feeding.   For fluid and electrolyte replacement in diarrheal diseases.   As a diet before certain medical tests are performed.  ADEQUACY The clear liquid diet is adequate only in ascorbic acid, according to the Recommended Dietary Allowances of the [employer]. CHOOSING FOODS Breads and Starches  Allowed:  None are allowed.   Avoid: All are avoided.  Vegetables  Allowed:  Strained tomato or vegetable juice.   Avoid: Any others.  Fruit  Allowed:  Strained fruit juices and fruit drinks. Include 1 serving of citrus or vitamin C-enriched fruit juice daily.   Avoid: Any others.  Meat and Meat Substitutes  Allowed:   None are allowed.   Avoid: All are avoided.  Milk  Allowed:  None are allowed.   Avoid: All are avoided.  Soups and Combination Foods  Allowed:  Clear bouillon, broth, or strained broth-based soups.   Avoid: Any others.  Desserts and Sweets  Allowed:  Sugar, honey. High protein gelatin. Flavored gelatin, ices, or frozen ice pops that do not contain milk.   Avoid: Any others.  Fats and Oils  Allowed:  None are allowed.   Avoid: All are avoided.  Beverages  Allowed: Cereal beverages, coffee (regular or decaffeinated), tea, or soda at the discretion of your caregiver.   Avoid: Any others.  Condiments  Allowed:  Iodized salt.   Avoid: Any others, including pepper.  Supplements  Allowed:  Liquid nutrition beverages.   Avoid: Any others that contain lactose or fiber.  SAMPLE MEAL PLAN Breakfast  4 oz (120 mL) strained orange juice.    to 1 cup (125 to 250 mL) gelatin (plain or fortified).   1 cup (250 mL) beverage (coffee or tea).   Sugar, if desired.  Midmorning Snack   cup (125 mL) gelatin (plain or fortified).  Lunch  1 cup (250 mL) broth or consomm.   4 oz (120 mL) strained grapefruit juice.    cup (125 mL) gelatin (plain or fortified).   1 cup (250 mL) beverage (coffee or tea).   Sugar, if desired.  Midafternoon Snack   cup (125 mL) fruit ice.    cup (125 mL) strained fruit juice.  Dinner  1 cup (250 mL) broth or consomm.  cup (125 mL) cranberry juice.    cup (125 mL) flavored gelatin (plain or fortified).   1 cup (250 mL) beverage (coffee or tea).   Sugar, if desired.  Evening Snack  4 oz (120 mL) strained apple juice (vitamin C-fortified).    cup (125 mL) flavored gelatin (plain or fortified).  Document Released: 01/14/2005 Document Revised: 01/03/2011 Document Reviewed: 04/13/2010 [employer] Patient Information 2012 [place].

## 2011-06-23 NOTE — ED Notes (Signed)
Pt c/o N/V/D. Onset 3 days ago.  Daughter at home is having same SX.  Denies fever.

## 2011-06-23 NOTE — ED Provider Notes (Signed)
History     CSN: [id number]  Arrival date & time 06/23/11  1746   First MD Initiated Contact with Patient 06/23/11 1752      Chief Complaint  Patient presents with  . Nausea    (Consider location/radiation/quality/duration/timing/severity/associated sxs/prior treatment) HPI Comments: Patient presents urgent care today complaining of 3 days of diarrheas and vomiting she has had about 6-7 episodes of diarrhea and vomited 3-5 times. Her stomach is sore (points to epigastric area). Patient denies any shortness of breath, chest pains, cough. Is unaware of she has had any fevers. She also describes that her daughter at home is also having the same symptoms, although they eat regularly out they usually different things shows she has a low suspicion that they could have eaten something contaminated.  Patient is a 56 y.o. female presenting with diarrhea. The history is provided by the patient.  Diarrhea The primary symptoms include weight loss, abdominal pain, nausea, vomiting and diarrhea. Primary symptoms do not include fever, fatigue, hematemesis, arthralgias or rash. The illness began 3 to 5 days ago. The onset was sudden. The problem has not changed since onset. The illness is also significant for chills, anorexia and bloating. The illness does not include constipation, tenesmus, back pain or itching. Significant associated medical issues include GERD. Associated medical issues do not include liver disease, alcohol abuse, bowel resection, hemorrhoids or diverticulitis.    Past Medical History  Diagnosis Date  . Allergic rhinitis     gets shots per Dr. Caprice Red  . GERD (gastroesophageal reflux disease)   . Hyperlipidemia   . Hypertension   . Hypothyroidism   . Anemia   . Coronary artery disease     had MI in 2001, seees Dr. Fletcher Anon at [facility]  . History of cardiac catheterization 07-24-07    showed nonconclusive disease  . Gynecological examination     sees Dr. Cherylann Banas      Past Surgical History  Procedure Date  . Cesarean section 1984  . Ventral hernia repair   . Thyroidectomy   . Tonsillectomy     Family History  Problem Relation Age of Onset  . Cancer Other     breast,colon,prostate  . Alcohol abuse Other   . Hyperlipidemia Other   . Hypertension Other   . Stroke Other   . Heart disease Other     History  Substance Use Topics  . Smoking status: Never Smoker   . Smokeless tobacco: Never Used  . Alcohol Use: No    OB History    Grav Para Term Preterm Abortions TAB SAB Ect Mult Living                  Review of Systems  Constitutional: Positive for chills, weight loss, activity change and appetite change. Negative for fever and fatigue.  Gastrointestinal: Positive for nausea, vomiting, abdominal pain, diarrhea, bloating and anorexia. Negative for constipation, blood in stool, rectal pain and hematemesis.  Musculoskeletal: Negative for back pain and arthralgias.  Skin: Negative for itching and rash.    Allergies  Sulfamethoxazole  Home Medications   Current Outpatient Rx  Name Route Sig Dispense Refill  . ALBUTEROL SULFATE HFA 108 (90 BASE) MCG/ACT IN AERS Inhalation Inhale 2 puffs into the lungs every 4 (four) hours as needed. 3 Inhaler 3  . ASPIRIN 325 MG PO TABS Oral Take 325 mg by mouth daily.      . ATORVASTATIN CALCIUM 20 MG PO TABS Oral Take 1 tablet (20 mg  total) by mouth daily. 90 tablet 3  . BLACK COHOSH 40 MG PO CAPS Oral Take 1 capsule by mouth daily.      Marland Kitchen CALCIUM CARBONATE ANTACID 1000 MG PO CHEW Oral Chew 1,000 mg by mouth daily.      Marland Kitchen CETIRIZINE HCL 10 MG PO TABS Oral Take 10 mg by mouth daily.    Marland Kitchen ESOMEPRAZOLE MAGNESIUM 40 MG PO CPDR Oral Take 1 capsule (40 mg total) by mouth daily before breakfast. 90 capsule 3  . FLUOXETINE HCL 40 MG PO CAPS Oral Take 1 capsule (40 mg total) by mouth daily. 90 capsule 3  . LEVOTHYROXINE SODIUM 200 MCG PO TABS Oral Take 1 tablet (200 mcg total) by mouth daily. 90 tablet 3    . LOSARTAN POTASSIUM 100 MG PO TABS Oral Take 1 tablet (100 mg total) by mouth daily. 90 tablet 3    Please advise pt to schedule complete physical app ...  . ONE-DAILY MULTI VITAMINS PO TABS Oral Take 1 tablet by mouth daily.      Marland Kitchen TEMAZEPAM 30 MG PO CAPS Oral Take 1 capsule (30 mg total) by mouth at bedtime as needed for sleep. 90 capsule 1  . TEMAZEPAM 30 MG PO CAPS Oral Take 1 capsule (30 mg total) by mouth at bedtime as needed for sleep. 90 capsule 1  . TEMAZEPAM 30 MG PO CAPS Oral Take 1 capsule (30 mg total) by mouth at bedtime as needed for sleep. 90 capsule 0  . TRIAMCINOLONE ACETONIDE 55 MCG/ACT NA INHA Nasal Place 2 sprays into the nose daily.      Marland Kitchen VITAMIN E 400 UNITS PO CAPS Oral Take 400 Units by mouth daily.        BP 140/85  Pulse 73  Temp(Src) 99 F (37.2 C) (Oral)  Resp 18  SpO2 93%  Physical Exam  Nursing note and vitals reviewed. Constitutional: She appears well-developed and well-nourished.  HENT:  Head: Normocephalic.  Eyes: Conjunctivae are normal. No scleral icterus.  Neck: Neck supple.  Abdominal: Soft. Bowel sounds are normal. She exhibits no distension and no mass. There is tenderness in the epigastric area. There is no rigidity, no rebound, no guarding, no tenderness at McBurney's point and negative Murphy's sign.    Skin: Skin is warm.    ED Course  Procedures (including critical care time)  Labs Reviewed - No data to display No results found.   1. Diarrhea   2. Vomiting   3. Dehydration       MDM  Patient reports nausea diarrhea and abdominal cramps for 3 days. With daughter and same household with similar symptoms. Abdomen is soft and patient has mild dry oral mucosa. Abdominal exam is not localized to any given quadrant. Suspect her daughter and her could have either a symptom that was contaminated or this is a viral gastroenteritis. Have encouraged patient to rehydrate herself and use an antiemetic and antidiarrheal medicine for the  next 48 hours. We discuss in detail what symptoms will work further investigation and workup in the emergency department. She understands and agrees with treatment plan and followup care as necessary        Rosana Hoes, MD 06/23/11 1900

## 2011-07-02 ENCOUNTER — Telehealth: Payer: Self-pay | Admitting: Family Medicine

## 2011-07-02 NOTE — Telephone Encounter (Signed)
Refill request for Temazepam 30 take 1 po qd ( 90 day supply ) and pt last here 03/06/11.

## 2011-07-02 NOTE — Telephone Encounter (Signed)
Call in #[phone] with 5 rf

## 2011-07-04 MED ORDER — TEMAZEPAM 30 MG PO CAPS: 30.0000 mg | ORAL_CAPSULE | Freq: Every evening | ORAL | Status: DC | PRN

## 2011-07-04 NOTE — Telephone Encounter (Signed)
I printed script and faxed to [facility].

## 2011-07-08 ENCOUNTER — Other Ambulatory Visit: Payer: Self-pay | Admitting: Family Medicine

## 2011-09-30 ENCOUNTER — Emergency Department (INDEPENDENT_AMBULATORY_CARE_PROVIDER_SITE_OTHER): Payer: BC Managed Care – PPO

## 2011-09-30 ENCOUNTER — Encounter (HOSPITAL_COMMUNITY): Payer: Self-pay

## 2011-09-30 ENCOUNTER — Emergency Department (INDEPENDENT_AMBULATORY_CARE_PROVIDER_SITE_OTHER)
Admission: EM | Admit: 2011-09-30 | Discharge: 2011-09-30 | Disposition: A | Discharge status: Home / Self Care | Payer: BC Managed Care – PPO | Source: Home / Self Care | Attending: Emergency Medicine | Admitting: Emergency Medicine

## 2011-09-30 DIAGNOSIS — B9789 Other viral agents as the cause of diseases classified elsewhere: Secondary | ICD-10-CM

## 2011-09-30 DIAGNOSIS — B349 Viral infection, unspecified: Secondary | ICD-10-CM

## 2011-09-30 LAB — POCT RAPID STREP A: Streptococcus, Group A Screen (Direct): NEGATIVE

## 2011-09-30 MED ORDER — HYDROCOD POLST-CHLORPHEN POLST 10-8 MG/5ML PO LQCR: 5.0000 mL | Freq: Two times a day (BID) | ORAL | Status: DC | PRN

## 2011-09-30 NOTE — ED Provider Notes (Signed)
History     CSN: [id number]  Arrival date & time 09/30/11  1647   First MD Initiated Contact with Patient 09/30/11 1815      No chief complaint on file.   (Consider location/radiation/quality/duration/timing/severity/associated sxs/prior treatment) HPI Comments: Malaise, myalgias are a chief problems for her. Along with above sx's.   Patient is a 56 y.o. female presenting with pharyngitis.  Sore Throat The current episode started 12 to 24 hours ago. The problem occurs constantly. The problem has been gradually worsening. Associated symptoms include shortness of breath. Pertinent negatives include no chest pain and no abdominal pain. The symptoms are aggravated by exertion. Nothing relieves the symptoms.    Past Medical History  Diagnosis Date  . Allergic rhinitis     gets shots per Dr. Caprice Red  . GERD (gastroesophageal reflux disease)   . Hyperlipidemia   . Hypertension   . Hypothyroidism   . Anemia   . Coronary artery disease     had MI in 2001, seees Dr. Fletcher Anon at [facility]  . History of cardiac catheterization 07-24-07    showed nonconclusive disease  . Gynecological examination     sees Dr. Cherylann Banas     Past Surgical History  Procedure Date  . Cesarean section 1984  . Ventral hernia repair   . Thyroidectomy   . Tonsillectomy     Family History  Problem Relation Age of Onset  . Cancer Other     breast,colon,prostate  . Alcohol abuse Other   . Hyperlipidemia Other   . Hypertension Other   . Stroke Other   . Heart disease Other     History  Substance Use Topics  . Smoking status: Never Smoker   . Smokeless tobacco: Never Used  . Alcohol Use: No    OB History    Grav Para Term Preterm Abortions TAB SAB Ect Mult Living                  Review of Systems  Constitutional: Positive for fever, chills, activity change and fatigue.  HENT: Positive for ear pain, congestion and postnasal drip. Negative for neck pain, neck stiffness and  tinnitus.   Eyes: Negative for photophobia and discharge.  Respiratory: Positive for cough and shortness of breath. Negative for apnea and wheezing.   Cardiovascular: Negative for chest pain and leg swelling.  Gastrointestinal: Negative.  Negative for abdominal pain.  Genitourinary: Negative.   Musculoskeletal: Positive for myalgias.  Neurological: Negative.   Hematological: Negative.     Allergies  Sulfamethoxazole  Home Medications   Current Outpatient Rx  Name Route Sig Dispense Refill  . ALBUTEROL SULFATE HFA 108 (90 BASE) MCG/ACT IN AERS Inhalation Inhale 2 puffs into the lungs every 4 (four) hours as needed. 3 Inhaler 3  . ASPIRIN 325 MG PO TABS Oral Take 325 mg by mouth daily.      . ATORVASTATIN CALCIUM 20 MG PO TABS  TAKE 1 TABLET DAILY 90 tablet 2  . BLACK COHOSH 40 MG PO CAPS Oral Take 1 capsule by mouth daily.      Marland Kitchen CALCIUM CARBONATE ANTACID 1000 MG PO CHEW Oral Chew 1,000 mg by mouth daily.      Marland Kitchen CETIRIZINE HCL 10 MG PO TABS Oral Take 10 mg by mouth daily.    Marland Kitchen ESOMEPRAZOLE MAGNESIUM 40 MG PO CPDR Oral Take 1 capsule (40 mg total) by mouth daily before breakfast. 90 capsule 3  . FLUOXETINE HCL 40 MG PO CAPS Oral Take 1  capsule (40 mg total) by mouth daily. 90 capsule 3  . LEVOTHYROXINE SODIUM 200 MCG PO TABS Oral Take 1 tablet (200 mcg total) by mouth daily. 90 tablet 3  . LOSARTAN POTASSIUM 100 MG PO TABS Oral Take 1 tablet (100 mg total) by mouth daily. 90 tablet 3    Please advise pt to schedule complete physical app ...  . ONE-DAILY MULTI VITAMINS PO TABS Oral Take 1 tablet by mouth daily.      . ORALYTE PO SOLN Oral Take 2 L by mouth 2 (two) times daily. 4 Bottle 0  . TEMAZEPAM 30 MG PO CAPS Oral Take 1 capsule (30 mg total) by mouth at bedtime as needed for sleep. 90 capsule 1  . TRIAMCINOLONE ACETONIDE 55 MCG/ACT NA INHA Nasal Place 2 sprays into the nose daily.      Marland Kitchen VITAMIN E 400 UNITS PO CAPS Oral Take 400 Units by mouth daily.        BP 159/86  Pulse 83   Temp 99.5 F (37.5 C) (Oral)  Resp 17  SpO2 94%  Physical Exam  Nursing note and vitals reviewed. Constitutional: She is oriented to person, place, and time. She appears well-developed and well-nourished. No distress.  HENT:  Right Ear: External ear normal.  Left Ear: External ear normal.       PND clear, minor erythema, no exudates or lesions.   Eyes: Right eye exhibits no discharge. Left eye exhibits no discharge. No scleral icterus.  Neck: Normal range of motion. Neck supple.  Cardiovascular: Normal rate, regular rhythm and normal heart sounds.   Pulmonary/Chest: Effort normal and breath sounds normal.  Musculoskeletal: Normal range of motion.  Neurological: She is alert and oriented to person, place, and time.  Skin: Skin is warm and dry. No rash noted. She is not diaphoretic. No erythema.  Psychiatric: She has a normal mood and affect.    ED Course  Procedures (including critical care time)  Labs Reviewed - No data to display No results found.   No diagnosis found.    MDM  Rapid strep neg CXR, mild changes assoc with COPD, otherwise no acute findings per radiologist. Tussionex susp 1 tsp q 12h prn, rest, plenty of fluids, Out of work for next 2 d. For worsening or new sx's told to go to ED or see her PCP this week if not better.          Janne Napoleon, NP 09/30/11 1940

## 2011-09-30 NOTE — ED Provider Notes (Signed)
Dg Chest 2 View  09/30/2011  *RADIOLOGY REPORT*  Clinical Data: Fever and shortness of breath.  CHEST - 2 VIEW  Comparison: 11/02/2008.  Findings: Normal sized heart.  Clear lungs.  The lungs remain mildly hyperexpanded with mild prominence of the interstitial markings.  Thoracic spine degenerative changes.  IMPRESSION: Mild changes of COPD.  No acute abnormality.   Original Report Authenticated By: Gerald Stabs, M.D.     Dx: viral syndrome  Medical screening examination/treatment/procedure(s) were performed by non-physician practitioner and as supervising physician I was immediately available for consultation/collaboration.  Cherly Beach MD   Cherly Beach, MD 09/30/11 2218

## 2011-09-30 NOTE — ED Notes (Signed)
States that since yesterday, she has had ST, pain in back w breathing, fever, facial redness and swelling, pus pockets on tonsils; "I feel like I have the flu"

## 2011-10-02 ENCOUNTER — Encounter: Payer: Self-pay | Admitting: Family Medicine

## 2011-10-02 ENCOUNTER — Ambulatory Visit (INDEPENDENT_AMBULATORY_CARE_PROVIDER_SITE_OTHER): Payer: BC Managed Care – PPO | Admitting: Family Medicine

## 2011-10-02 VITALS — BP 122/78 | HR 97 | Temp 99.4°F | Wt 296.0 lb

## 2011-10-02 DIAGNOSIS — J329 Chronic sinusitis, unspecified: Secondary | ICD-10-CM

## 2011-10-02 MED ORDER — AZITHROMYCIN 250 MG PO TABS: ORAL_TABLET | ORAL | Status: AC

## 2011-10-02 MED ORDER — ALBUTEROL SULFATE HFA 108 (90 BASE) MCG/ACT IN AERS: 2.0000 | INHALATION_SPRAY | RESPIRATORY_TRACT | Status: DC | PRN

## 2011-10-02 MED ORDER — LEVOTHYROXINE SODIUM 200 MCG PO TABS: 200.0000 ug | ORAL_TABLET | Freq: Every day | ORAL | Status: DC

## 2011-10-02 MED ORDER — ESOMEPRAZOLE MAGNESIUM 40 MG PO CPDR: 40.0000 mg | DELAYED_RELEASE_CAPSULE | Freq: Every day | ORAL | Status: DC

## 2011-10-02 MED ORDER — LOSARTAN POTASSIUM 100 MG PO TABS: 100.0000 mg | ORAL_TABLET | Freq: Every day | ORAL | Status: DC

## 2011-10-02 MED ORDER — FLUOXETINE HCL 40 MG PO CAPS: 40.0000 mg | ORAL_CAPSULE | Freq: Every day | ORAL | Status: DC

## 2011-10-02 MED ORDER — ATORVASTATIN CALCIUM 20 MG PO TABS: 20.0000 mg | ORAL_TABLET | Freq: Every day | ORAL | Status: DC

## 2011-10-02 NOTE — Progress Notes (Signed)
  Subjective:    Patient ID: Nancy [name], female    DOB: 10-30-1955, 56 y.o.   MRN: [id number]  HPI Here for 4 days of fever to 102 degrees, aches, HA, ST, and coughing up yellow sputum. She was seen at Urgent Care on 09-30-11 where a rapid strep test was negative and a CXR revealed no acute processes. She was given a cough medicine and told to take Tylenol.    Review of Systems  Constitutional: Positive for fever.  HENT: Positive for congestion, postnasal drip and sinus pressure.   Eyes: Negative.   Respiratory: Positive for cough.        Objective:   Physical Exam  Constitutional: She appears well-developed and well-nourished.  HENT:  Right Ear: External ear normal.  Left Ear: External ear normal.  Nose: Nose normal.  Mouth/Throat: Oropharynx is clear and moist.  Eyes: Conjunctivae are normal.  Neck: No thyromegaly present.  Pulmonary/Chest: Effort normal and breath sounds normal.  Lymphadenopathy:    She has no cervical adenopathy.          Assessment & Plan:  Add Mucinex

## 2011-10-03 ENCOUNTER — Telehealth: Payer: Self-pay | Admitting: Family Medicine

## 2011-10-03 NOTE — Telephone Encounter (Signed)
Pt needs work note from 9-5 thru 10-05-2011 due to fever. Fax to [facility] attn Doren Custard Christmas [phone]

## 2011-10-04 NOTE — Telephone Encounter (Signed)
Note was approved and faxed.

## 2011-10-24 ENCOUNTER — Emergency Department (HOSPITAL_COMMUNITY): Payer: BC Managed Care – PPO

## 2011-10-24 ENCOUNTER — Encounter (HOSPITAL_COMMUNITY): Payer: Self-pay | Admitting: Emergency Medicine

## 2011-10-24 ENCOUNTER — Observation Stay (HOSPITAL_COMMUNITY)
Admission: EM | Admit: 2011-10-24 | Discharge: 2011-10-26 | Disposition: A | Discharge status: Home / Self Care | Payer: BC Managed Care – PPO | Attending: Internal Medicine | Admitting: Internal Medicine

## 2011-10-24 DIAGNOSIS — K219 Gastro-esophageal reflux disease without esophagitis: Secondary | ICD-10-CM

## 2011-10-24 DIAGNOSIS — N951 Menopausal and female climacteric states: Secondary | ICD-10-CM

## 2011-10-24 DIAGNOSIS — Z9189 Other specified personal risk factors, not elsewhere classified: Secondary | ICD-10-CM

## 2011-10-24 DIAGNOSIS — E039 Hypothyroidism, unspecified: Secondary | ICD-10-CM

## 2011-10-24 DIAGNOSIS — R0602 Shortness of breath: Secondary | ICD-10-CM | POA: Insufficient documentation

## 2011-10-24 DIAGNOSIS — E785 Hyperlipidemia, unspecified: Secondary | ICD-10-CM

## 2011-10-24 DIAGNOSIS — I2510 Atherosclerotic heart disease of native coronary artery without angina pectoris: Secondary | ICD-10-CM | POA: Diagnosis present

## 2011-10-24 DIAGNOSIS — D649 Anemia, unspecified: Secondary | ICD-10-CM

## 2011-10-24 DIAGNOSIS — D179 Benign lipomatous neoplasm, unspecified: Secondary | ICD-10-CM

## 2011-10-24 DIAGNOSIS — I10 Essential (primary) hypertension: Secondary | ICD-10-CM

## 2011-10-24 DIAGNOSIS — R51 Headache: Secondary | ICD-10-CM

## 2011-10-24 DIAGNOSIS — J309 Allergic rhinitis, unspecified: Secondary | ICD-10-CM

## 2011-10-24 DIAGNOSIS — R079 Chest pain, unspecified: Principal | ICD-10-CM

## 2011-10-24 DIAGNOSIS — E7800 Pure hypercholesterolemia, unspecified: Secondary | ICD-10-CM | POA: Diagnosis present

## 2011-10-24 HISTORY — DX: Angina pectoris, unspecified: I20.9

## 2011-10-24 HISTORY — DX: Chronic obstructive pulmonary disease, unspecified: J44.9

## 2011-10-24 HISTORY — DX: Migraine, unspecified, not intractable, without status migrainosus: G43.909

## 2011-10-24 HISTORY — DX: Acute myocardial infarction, unspecified: I21.9

## 2011-10-24 LAB — CBC
HCT: 40.5 % (ref 36.0–46.0)
Hemoglobin: 13.7 g/dL (ref 12.0–15.0)
MCHC: 33.8 g/dL (ref 30.0–36.0)
MCV: 85.6 fL (ref 78.0–100.0)
WBC: 6.9 10*3/uL (ref 4.0–10.5)

## 2011-10-24 LAB — BASIC METABOLIC PANEL
BUN: 18 mg/dL (ref 6–23)
Chloride: 103 mEq/L (ref 96–112)
Creatinine, Ser: 1.01 mg/dL (ref 0.50–1.10)
Glucose, Bld: 112 mg/dL — ABNORMAL HIGH (ref 70–99)
Potassium: 4.3 mEq/L (ref 3.5–5.1)

## 2011-10-24 LAB — TROPONIN I: Troponin I: <0.3 ng/mL (ref <0.30)

## 2011-10-24 MED ORDER — MORPHINE SULFATE 2 MG/ML IJ SOLN
2.0000 mg | Freq: Once | INTRAMUSCULAR | Status: AC
  Administered 2011-10-24: 2 mg via INTRAVENOUS
  Filled 2011-10-24: qty 1

## 2011-10-24 MED ORDER — MORPHINE SULFATE 4 MG/ML IJ SOLN
4.0000 mg | Freq: Once | INTRAMUSCULAR | Status: AC
  Administered 2011-10-24: 4 mg via INTRAVENOUS
  Filled 2011-10-24: qty 1

## 2011-10-24 MED ORDER — NITROGLYCERIN 0.4 MG SL SUBL
0.4000 mg | SUBLINGUAL_TABLET | SUBLINGUAL | Status: DC | PRN
  Administered 2011-10-24: 0.4 mg via SUBLINGUAL

## 2011-10-24 MED ORDER — NITROGLYCERIN 0.4 MG SL SUBL
SUBLINGUAL_TABLET | SUBLINGUAL | Status: AC
  Administered 2011-10-24: 0.4 mg via SUBLINGUAL
  Filled 2011-10-24: qty 25

## 2011-10-24 NOTE — H&P (Signed)
Patient's PCP: Laurey Morale, MD Patient's cardiologist: Dr. Fletcher Anon at [facility], 3 years ago, currently does not have her cardiologist.  Chief Complaint: Chest pain  History of Present Illness: Nancy [name] is a 56 y.o. Caucasian female history of coronary artery disease (nonobstructive? per patient's history) status post recent catheter approximately 3-4 years ago at [facility] with 30% blockage at unknown vessel, GERD, hypertension, hyperlipidemia, hypothyroidism, and history of tobacco use who presents with the above complaints.  Patient works at [employer] for third shift, she woke up this morning at 10 a.m. with chest pain, she thought she had indigestion and took some TUMS.  She woke up again this afternoon with persistent chest pain with some radiating pain to bilateral shoulder blades as a result she presented to the emergency department for further evaluation.  She reports that about a month ago she had some chest pain.  Over the last month she has been feeling weak and fatigued but may be due to an upper respiratory viral syndrome.  Prior to coming to the emergency department the patient had taken expired nitroglycerin without any relief.  On presentation to the emergency department patient received some morphine and nitroglycerin with some relief.  Given patient's history of coronary artery disease, the hospitalist service was asked to admit the patient for further care and management.  Has had fevers recently due to upper respiratory viral infection.  Denies any nausea or vomiting.  Denies any shortness of breath at this time.  Denies any abdominal pain or diarrhea.  Denies any headaches or vision changes.  Past Medical History  Diagnosis Date  . Allergic rhinitis     gets shots per Dr. Caprice Red  . GERD (gastroesophageal reflux disease)   . Hyperlipidemia   . Hypertension   . Hypothyroidism   . Anemia   . Coronary artery disease     had MI in 2001, seees Dr.  Fletcher Anon at [facility]  . History of cardiac catheterization 07-24-07    showed nonconclusive disease  . Gynecological examination     sees Dr. Cherylann Banas    Past Surgical History  Procedure Date  . Cesarean section 1984  . Ventral hernia repair   . Thyroidectomy   . Tonsillectomy    Family History  Problem Relation Age of Onset  . Cancer Other     breast,colon,prostate  . Alcohol abuse Other   . Hyperlipidemia Other   . Hypertension Other   . Stroke Other   . Heart disease Other   . Breast cancer Mother   . Pulmonary fibrosis Mother   . Coronary artery disease Father   . Hypertension Father    History   Social History  . Marital Status: Single    Spouse Name: N/A    Number of Children: N/A  . Years of Education: N/A   Occupational History  . Not on file.   Social History Main Topics  . Smoking status: Former Smoker    Quit date: 10/24/2006  . Smokeless tobacco: Never Used  . Alcohol Use: No  . Drug Use: No  . Sexually Active: Not on file   Other Topics Concern  . Not on file   Social History Narrative  . No narrative on file   Allergies: Sulfamethoxazole  Meds: Scheduled Meds:   .  morphine injection  4 mg Intravenous Once  . nitroGLYCERIN       Continuous Infusions:  PRN Meds:.nitroGLYCERIN  Review of Systems: All systems reviewed with the  patient and positive as per history of present illness, otherwise all other systems are negative.  Physical Exam: Blood pressure 100/49, pulse 71, temperature 98.2 F (36.8 C), temperature source Oral, resp. rate 18, SpO2 96.00%. General: Awake, Oriented x3, No acute distress. HEENT: EOMI, Moist mucous membranes Neck: Supple CV: S1 and S2 Lungs: Clear to ascultation bilaterally Abdomen: Soft, Nontender, Nondistended, +bowel sounds. Ext: Good pulses. Trace edema. No clubbing or cyanosis noted. Neuro: Cranial Nerves II-XII grossly intact. Has 5/5 motor strength in upper and lower extremities. Chest: No  reproducible pain on palpation.  Lab results:  [facility] 10/24/11 1718  NA 138  K 4.3  CL 103  CO2 24  GLUCOSE 112*  BUN 18  CREATININE 1.01  CALCIUM 9.1  MG --  PHOS --   No results found for this basename: AST:2,ALT:2,ALKPHOS:2,BILITOT:2,PROT:2,ALBUMIN:2 in the last 72 hours No results found for this basename: LIPASE:2,AMYLASE:2 in the last 72 hours  Basename 10/24/11 1718  WBC 6.9  NEUTROABS --  HGB 13.7  HCT 40.5  MCV 85.6  PLT 173    Basename 10/24/11 2016  CKTOTAL --  CKMB --  CKMBINDEX --  TROPONINI <0.30   No components found with this basename: POCBNP:3 No results found for this basename: DDIMER in the last 72 hours No results found for this basename: HGBA1C:2 in the last 72 hours No results found for this basename: CHOL:2,HDL:2,LDLCALC:2,TRIG:2,CHOLHDL:2,LDLDIRECT:2 in the last 72 hours No results found for this basename: TSH,T4TOTAL,FREET3,T3FREE,THYROIDAB in the last 72 hours No results found for this basename: VITAMINB12:2,FOLATE:2,FERRITIN:2,TIBC:2,IRON:2,RETICCTPCT:2 in the last 72 hours Imaging results:  Dg Chest 2 View  10/24/2011  *RADIOLOGY REPORT*  Clinical Data: Chest pain and shortness of breath.  CHEST - 2 VIEW  Comparison: Chest x-ray 09/30/2011.  Findings: Lungs appear mildly hyperexpanded with slight flattening of the hemidiaphragms and increased retrosternal air space, suggesting mild COPD.  This is unchanged.  No acute consolidative airspace disease.  No pleural effusions.  Pulmonary vasculature and the cardiomediastinal silhouette are otherwise within normal limits.  Atherosclerosis in the thoracic aorta.  IMPRESSION: 1.  Chronic changes of mild COPD redemonstrated, without radiographic evidence of acute cardiopulmonary disease. 2.  Atherosclerosis.   Original Report Authenticated By: Etheleen Mayhew, M.D.    Dg Chest 2 View  09/30/2011  *RADIOLOGY REPORT*  Clinical Data: Fever and shortness of breath.  CHEST - 2 VIEW  Comparison: 11/02/2008.   Findings: Normal sized heart.  Clear lungs.  The lungs remain mildly hyperexpanded with mild prominence of the interstitial markings.  Thoracic spine degenerative changes.  IMPRESSION: Mild changes of COPD.  No acute abnormality.   Original Report Authenticated By: Gerald Stabs, M.D.    Other results: EKG: Normal sinus rhythm unchanged from previous EKG.  Assessment & Plan by Problem: Chest pain (history of coronary artery disease) Admit the patient to telemetry.  Cycle troponins to rule the patient out for acute coronary syndrome, initial troponin negative.  Continue aspirin 325 mg daily.  Discussed with Dr. Louie Bun, ED physician, [facility] Cardiology consulted.  Will defer to cardiology for further evaluation (including 2-D echocardiogram).  Chest x-ray not suggestive for any infectious etiology or any indication for why the patient is having chest pain.  GERD Continue PPI.  Hypothyroidism Continue Synthroid.  Check TSH and free T4 in the morning.  Hyperlipidemia Continue statin.  Hypertension Continue home antihypertensive medications.  Prophylaxis Lovenox.  CODE STATUS Full code.  This was discussed with the patient at time of admission.  Disposition Admit the  patient as observation.  Time spent on admission, talking to the patient, and coordinating care was: 60 mins.  Lizbeth Feijoo A, MD 10/24/2011, 10:38 PM

## 2011-10-24 NOTE — ED Notes (Signed)
MD at bedside. 

## 2011-10-24 NOTE — ED Provider Notes (Addendum)
56 year old female who has a history of myocardial infarction was awakened by chest pressure across her mid chest at about 10 AM. This has not gone away during the course of the day. There is no associated dyspnea, nausea, diaphoresis. Pressure feeling is similar to what she had with prior myocardial infarction. She states she did not have bypass surgery or stent placement. She has not seen her cardiologist in over 2 years. She did try taking a nitroglycerin which was old and did not give any relief but that also did not her headache or burn or tingle under her tongue. She is feeling better after morphine in the emergency department. She will need to be admitted for further cardiac evaluation.   Date: 10/24/2011  Rate: 81  Rhythm: normal sinus rhythm  QRS Axis: normal  Intervals: normal  ST/T Wave abnormalities: normal  Conduction Disutrbances:none  Narrative Interpretation: Poor R-wave progression across the precordium. When compared with ECG of 10/01/2004, no significant changes are seen.  Old EKG Reviewed: unchanged    Delora Fuel, MD 48/47/20 7218  I saw and evaluated the patient, reviewed the resident's note and I agree with the findings and plan.  Delora Fuel, MD 28/83/37 [id number]

## 2011-10-24 NOTE — ED Notes (Signed)
i-stat troponin results=.01

## 2011-10-24 NOTE — Consult Note (Signed)
CARDIOLOGY CONSULT NOTE  Patient ID: Nancy [name], MRN: [id number], DOB/AGE: 09-24-1955 56 y.o. Admit date: 10/24/2011 Date of Consult: 10/24/2011  Primary Physician: Laurey Morale, MD Primary Cardiologist: Althia Forts, requesting [place]  Chief Complaint: chest and back pain Reason for Consultation: recommendations regarding risk stratification  HPI: 56 y.o. female w/ PMHx significant for nonocclusive CAD, hyperlipidemia, obesity who presented to [facility] on 10/24/2011 with complaints of chest pain. She reports that the pain started this AM around 10:00 which woke her from sleep (night shift). She had eaten a large plate of spaghetti prior to bed so she attributed the pain to indigestion and went back to sleep. At 4 pm, she awoke again with the pain and noted it to be in the upper shoulders as well. No nausea, vomiting, lightheadedness. Felt mildly short of breath. Took old nitro at home without relief (and without fizzle or headache). Presented to the ED and given two nitro and morphine which relieved the pain though she reports that it maybe returning. She describes the pain as dull pressure that is not on the surface; unable to elicit or relieve the pain with movement. Notes right arm tingling.  Her cardiac history dates back to 2001 when she reports having an heart attack and subsequent catheterization where no blockages were found. She states they told her it was an atypical heart attack and though unsure what really happened, they attributed it to her anemia at the time. She had a pharmacologic stress MPI in 2003 which she reports was negative. She presented to [facility] in 06/2007 with chest pain (different from todays as it was center of chest, radiated to left arm and neck, assoc with nausea) and underwent a cardiac cath which she reports showed 30% blockages. No stents or interventions performed. No stress test since 2009. No longer follows with that cardiologist as he left the  practice  She reports rare chest pain not associated with exertion over the last several months. Took a nitro several weeks back while at work- did not seek further care. No exertional chest pain or SOB with ADLs.  Past Medical History  Diagnosis Date  . Allergic rhinitis     gets shots per Dr. Caprice Red  . GERD (gastroesophageal reflux disease)   . Hyperlipidemia   . Hypertension   . Hypothyroidism   . Anemia   . Coronary artery disease     had MI in 2001, seees Dr. Fletcher Anon at [facility]  . History of cardiac catheterization 07-24-07    showed nonconclusive disease  . Gynecological examination     sees Dr. Cherylann Banas     See HPI regarding CV histor  Surgical History:  Past Surgical History  Procedure Date  . Cesarean section 1984  . Ventral hernia repair   . Thyroidectomy   . Tonsillectomy      Home Meds: Prior to Admission medications   Medication Sig Start Date End Date Taking? Authorizing Provider  albuterol (PROVENTIL HFA;VENTOLIN HFA) 108 (90 BASE) MCG/ACT inhaler Inhale 2 puffs into the lungs every 4 (four) hours as needed for shortness of breath. 10/02/11  Yes Laurey Morale, MD  aspirin 325 MG tablet Take 325 mg by mouth daily.     Yes Historical Provider, MD  atorvastatin (LIPITOR) 20 MG tablet Take 1 tablet (20 mg total) by mouth daily. 10/02/11  Yes Laurey Morale, MD  Black Cohosh 40 MG CAPS Take 1 capsule by mouth daily.     Yes Historical Provider, MD  calcium elemental as carbonate (CALCIUM ANTACID ULTRA) 400 MG tablet Chew 1,000 mg by mouth daily.     Yes Historical Provider, MD  cetirizine (ZYRTEC) 10 MG tablet Take 10 mg by mouth daily.   Yes Historical Provider, MD  esomeprazole (NEXIUM) 40 MG capsule Take 1 capsule (40 mg total) by mouth daily before breakfast. 10/02/11  Yes Laurey Morale, MD  FLUoxetine (PROZAC) 40 MG capsule Take 1 capsule (40 mg total) by mouth daily. 10/02/11  Yes Laurey Morale, MD  levothyroxine (SYNTHROID, LEVOTHROID) 200 MCG tablet  Take 1 tablet (200 mcg total) by mouth daily. 10/02/11  Yes Laurey Morale, MD  losartan (COZAAR) 100 MG tablet Take 1 tablet (100 mg total) by mouth daily. 10/02/11  Yes Laurey Morale, MD  Multiple Vitamin (MULTIVITAMIN) tablet Take 1 tablet by mouth daily.     Yes Historical Provider, MD  triamcinolone (NASACORT) 55 MCG/ACT nasal inhaler Place 2 sprays into the nose daily.     Yes Historical Provider, MD  vitamin E (VITAMIN E) 400 UNIT capsule Take 400 Units by mouth daily.     Yes Historical Provider, MD  temazepam (RESTORIL) 30 MG capsule Take 1 capsule (30 mg total) by mouth at bedtime as needed for sleep. 07/04/11 08/03/11  Laurey Morale, MD    Inpatient Medications:    .  morphine injection  2 mg Intravenous Once  .  morphine injection  4 mg Intravenous Once  . nitroGLYCERIN          Allergies:  Allergies  Allergen Reactions  . Sulfamethoxazole     REACTION: hives    History   Social History  . Marital Status: Single    Spouse Name: N/A    Number of Children: N/A  . Years of Education: N/A   Occupational History  . Not on file.   Social History Main Topics  . Smoking status: Former Smoker    Quit date: 10/24/2006  . Smokeless tobacco: Never Used  . Alcohol Use: No  . Drug Use: No  . Sexually Active: Not on file   Other Topics Concern  . Not on file   Social History Narrative  . No narrative on file     Family History  Problem Relation Age of Onset  . Cancer Other     breast,colon,prostate  . Alcohol abuse Other   . Hyperlipidemia Other   . Hypertension Other   . Stroke Other   . Heart disease Other   . Breast cancer Mother   . Pulmonary fibrosis Mother   . Coronary artery disease Father   . Hypertension Father    Mother with MI in her 62s Father with CABG in 78s.  Review of Systems: General: negative for chills, fever, night sweats or weight changes.  Cardiovascular: See HPI. No edema, orthopnea, palpitations, or paroxysmal nocturnal  dyspnea Dermatological: negative for rash Respiratory: negative for cough or wheezing currrently, has inhaler Urologic: negative for hematuria Abdominal: negative for nausea, vomiting, diarrhea, bright red blood per rectum, melena, or hematemesis Neurologic: negative for visual changes, syncope, or dizziness All other systems reviewed and are otherwise negative except as noted above.  Labs:  [facility] 10/24/11 2016  CKTOTAL --  CKMB --  TROPONINI <0.30   Lab Results  Component Value Date   WBC 6.9 10/24/2011   HGB 13.7 10/24/2011   HCT 40.5 10/24/2011   MCV 85.6 10/24/2011   PLT 173 10/24/2011    Lab 10/24/11 1718  NA  138  K 4.3  CL 103  CO2 24  BUN 18  CREATININE 1.01  CALCIUM 9.1  PROT --  BILITOT --  ALKPHOS --  ALT --  AST --  GLUCOSE 112*   Lab Results  Component Value Date   CHOL 158 03/06/2011   HDL 50.90 03/06/2011   LDLCALC 78 03/06/2011   TRIG 144.0 03/06/2011   No results found for this basename: DDIMER    Radiology/Studies:  Dg Chest 2 View  10/24/2011  *RADIOLOGY REPORT*  Clinical Data: Chest pain and shortness of breath.  CHEST - 2 VIEW  Comparison: Chest x-ray 09/30/2011.  Findings: Lungs appear mildly hyperexpanded with slight flattening of the hemidiaphragms and increased retrosternal air space, suggesting mild COPD.  This is unchanged.  No acute consolidative airspace disease.  No pleural effusions.  Pulmonary vasculature and the cardiomediastinal silhouette are otherwise within normal limits.  Atherosclerosis in the thoracic aorta.  IMPRESSION: 1.  Chronic changes of mild COPD redemonstrated, without radiographic evidence of acute cardiopulmonary disease. 2.  Atherosclerosis.   Original Report Authenticated By: Etheleen Mayhew, M.D.    Dg Chest 2 View  09/30/2011  *RADIOLOGY REPORT*  Clinical Data: Fever and shortness of breath.  CHEST - 2 VIEW  Comparison: 11/02/2008.  Findings: Normal sized heart.  Clear lungs.  The lungs remain mildly hyperexpanded with  mild prominence of the interstitial markings.  Thoracic spine degenerative changes.  IMPRESSION: Mild changes of COPD.  No acute abnormality.   Original Report Authenticated By: Gerald Stabs, M.D.     EKG: sinus rhythm, PRWP, q in V2, minimally changed from 2011.  Physical Exam: Blood pressure 131/56, pulse 61, temperature 98.2 F (36.8 C), temperature source Oral, resp. rate 13, SpO2 92.00%. General: Well developed, well nourished, in no acute distress. Head: Normocephalic, atraumatic, sclera non-icteric, no xanthomas, nares are without discharge.  Neck: Supple. Negative for carotid bruits. JVD not elevated. Lungs: Clear bilaterally to auscultation without wheezes, rales, or rhonchi. Breathing is unlabored. Heart: RRR with S1 S2. No murmurs, rubs, or gallops appreciated. Abdomen: Soft, non-tender, non-distended with normoactive bowel sounds. No hepatomegaly. No rebound/guarding. No obvious abdominal masses. Msk:  Strength and tone appear normal for age. Extremities: No clubbing or cyanosis. No edema.  Distal pedal pulses are 2+ and equal bilaterally. Neuro: Alert and oriented X 3. Moves all extremities spontaneously. Psych:  Responds to questions appropriately with a normal affect.   Problem List: 1. Chest and shoulder pain, r/o ACS 2. Nonobstructive CAD by cath 2009, atherosclerosis on aorta by cxray 2. HTN, controlled on meds 3. Hyperlipidemia, controlled on meds 4. Former smoker 5. +FH of early CV disease (mother)  Assessment and Plan:  56 yo female with above risk factor profile presenting with chest pain onset 12 hours earlier. Encouragingly, her initial EKG and biomarkers are negative.  However, based upon her risk factor profile and presenting symptoms, continued evaluation with serial biomarkers, telemetry and EKGs is warranted.   Her symptoms of back pain raise the remote possibility of an aortic problem though this is still low on the differential. A d-dimer, if negative,  effectively would rule out aortic pathology in this low suspicion individual. If positive, however, further evaluation with CT would be recommended.  Recommend continuing statin, aspirin, and ACEI. No indication for beta blocker or full anticoagulation at this time.   If overnight evaluation is uneventful, recommend further risk stratification with stress test with imaging in the morning (eg. Stress myocardial perfusion imaging MPI, "stress  nuclear").   Summary of Recs: 1. Serial troponins, telemetry, EKG 2. Continue home anti-atherosclerosis regimen of statin, ACEI. 3. D-dimer to eval for aortic pathology 4. If serial evaluation is uneventful, recommend stress MPI. If negative, can followup with cardiology as outpatient.  Recommendations discussed directly with primary team. Thank you for this consult. Please call with questions.  Signed, Elias Else, Marwin Primmer C. MD 10/24/2011, 11:11 PM

## 2011-10-24 NOTE — ED Provider Notes (Signed)
History     CSN: [id number]  Arrival date & time 10/24/11  1707   None     Chief Complaint  Patient presents with  . Chest Pain    (Consider location/radiation/quality/duration/timing/severity/associated sxs/prior treatment) Patient is a 56 y.o. female presenting with chest pain. The history is provided by the patient.  Chest Pain The chest pain began 6 - 12 hours ago. Chest pain occurs constantly. The chest pain is unchanged. The pain is associated with exertion. At its most intense, the pain is at 10/10. The pain is currently at 8/10. The quality of the pain is described as similar to previous episodes and pressure-like. The pain radiates to the mid back. Chest pain is worsened by exertion. Primary symptoms include shortness of breath and nausea. Pertinent negatives for primary symptoms include no fever, no cough and no abdominal pain. She tried aspirin and nitroglycerin (NTG was expired) for the symptoms. Risk factors include obesity.  Her past medical history is significant for MI (per patient (no stents or CABG)).  Procedure history is positive for cardiac catheterization.     Past Medical History  Diagnosis Date  . Allergic rhinitis     gets shots per Dr. Caprice Red  . GERD (gastroesophageal reflux disease)   . Hyperlipidemia   . Hypertension   . Hypothyroidism   . Anemia   . Coronary artery disease     had MI in 2001, seees Dr. Fletcher Anon at [facility]  . History of cardiac catheterization 07-24-07    showed nonconclusive disease  . Gynecological examination     sees Dr. Cherylann Banas     Past Surgical History  Procedure Date  . Cesarean section 1984  . Ventral hernia repair   . Thyroidectomy   . Tonsillectomy     Family History  Problem Relation Age of Onset  . Cancer Other     breast,colon,prostate  . Alcohol abuse Other   . Hyperlipidemia Other   . Hypertension Other   . Stroke Other   . Heart disease Other     History  Substance Use Topics  .  Smoking status: Never Smoker   . Smokeless tobacco: Never Used  . Alcohol Use: No    OB History    Grav Para Term Preterm Abortions TAB SAB Ect Mult Living                  Review of Systems  Constitutional: Negative for fever.  Respiratory: Positive for shortness of breath. Negative for cough.   Cardiovascular: Positive for chest pain.  Gastrointestinal: Positive for nausea. Negative for abdominal pain and diarrhea.  Neurological: Negative for headaches.  All other systems reviewed and are negative.    Allergies  Sulfamethoxazole  Home Medications   Current Outpatient Rx  Name Route Sig Dispense Refill  . ALBUTEROL SULFATE HFA 108 (90 BASE) MCG/ACT IN AERS Inhalation Inhale 2 puffs into the lungs every 4 (four) hours as needed for shortness of breath. 3 Inhaler 3  . ASPIRIN 325 MG PO TABS Oral Take 325 mg by mouth daily.      . ATORVASTATIN CALCIUM 20 MG PO TABS Oral Take 1 tablet (20 mg total) by mouth daily. 90 tablet 3  . BLACK COHOSH 40 MG PO CAPS Oral Take 1 capsule by mouth daily.      Marland Kitchen CALCIUM CARBONATE ANTACID 1000 MG PO CHEW Oral Chew 1,000 mg by mouth daily.      Marland Kitchen CETIRIZINE HCL 10 MG PO TABS Oral  Take 10 mg by mouth daily.    Marland Kitchen ESOMEPRAZOLE MAGNESIUM 40 MG PO CPDR Oral Take 1 capsule (40 mg total) by mouth daily before breakfast. 90 capsule 3  . FLUOXETINE HCL 40 MG PO CAPS Oral Take 1 capsule (40 mg total) by mouth daily. 90 capsule 3  . LEVOTHYROXINE SODIUM 200 MCG PO TABS Oral Take 1 tablet (200 mcg total) by mouth daily. 90 tablet 3  . LOSARTAN POTASSIUM 100 MG PO TABS Oral Take 1 tablet (100 mg total) by mouth daily. 90 tablet 3    Please advise pt to schedule complete physical app ...  . ONE-DAILY MULTI VITAMINS PO TABS Oral Take 1 tablet by mouth daily.      . TRIAMCINOLONE ACETONIDE 55 MCG/ACT NA INHA Nasal Place 2 sprays into the nose daily.      Marland Kitchen VITAMIN E 400 UNITS PO CAPS Oral Take 400 Units by mouth daily.      Marland Kitchen TEMAZEPAM 30 MG PO CAPS Oral Take  1 capsule (30 mg total) by mouth at bedtime as needed for sleep. 90 capsule 1    BP 116/59  Pulse 73  Temp 98.2 F (36.8 C) (Oral)  Resp 14  SpO2 93%  Physical Exam  Nursing note and vitals reviewed. Constitutional: She is oriented to person, place, and time. She appears well-developed and well-nourished. No distress.  HENT:  Head: Normocephalic and atraumatic.  Eyes: EOM are normal. Pupils are equal, round, and reactive to light.  Neck: Normal range of motion.  Cardiovascular: Normal rate, regular rhythm and normal heart sounds.   Pulmonary/Chest: Effort normal and breath sounds normal. No respiratory distress.  Abdominal: Soft. She exhibits no distension. There is no tenderness. There is no rebound and no guarding.  Musculoskeletal: Normal range of motion.  Neurological: She is alert and oriented to person, place, and time.  Skin: Skin is warm and dry.    ED Course  Procedures (including critical care time)  Labs Reviewed  BASIC METABOLIC PANEL - Abnormal; Notable for the following:    Glucose, Bld 112 (*)     GFR calc non Af Amer 62 (*)     GFR calc Af Amer 72 (*)     All other components within normal limits  CBC  TROPONIN I   Dg Chest 2 View  10/24/2011  *RADIOLOGY REPORT*  Clinical Data: Chest pain and shortness of breath.  CHEST - 2 VIEW  Comparison: Chest x-ray 09/30/2011.  Findings: Lungs appear mildly hyperexpanded with slight flattening of the hemidiaphragms and increased retrosternal air space, suggesting mild COPD.  This is unchanged.  No acute consolidative airspace disease.  No pleural effusions.  Pulmonary vasculature and the cardiomediastinal silhouette are otherwise within normal limits.  Atherosclerosis in the thoracic aorta.  IMPRESSION: 1.  Chronic changes of mild COPD redemonstrated, without radiographic evidence of acute cardiopulmonary disease. 2.  Atherosclerosis.   Original Report Authenticated By: Etheleen Mayhew, M.D.      1. Chest pain   2.  Esophageal reflux   3. Unspecified essential hypertension   4. Unspecified hypothyroidism       MDM  8:04 PM Pt seen and examined. Pt with onset of chest pressure when she awoke this morning, around 10:00 AM. Pt states that this pain is similar to her two previous MIs. Her daughter states that both times she had cardiac caths that showed around 30% blockage, but no stents were placed. Pt no longer follows with cardiology. Concern for ACS. Pt  has already had ASA, but will give NTG to see if this relieves pain.  Pt has had two doses of NTG without change in pain, so will give dose of Morphine.   Morphine has decreased pain to 4/10 from 8/10. Troponin and EKG unremarkable. Will admit to medicine with cardiology consult tonight.         Tania Ade, MD 10/24/11 2352

## 2011-10-24 NOTE — ED Notes (Signed)
Pt c/o midsternal CP through to back x several hours today; pt sts SOB and diaphoresis but denies nausea; pt sts hx of CAD in past

## 2011-10-25 ENCOUNTER — Observation Stay (HOSPITAL_COMMUNITY): Payer: BC Managed Care – PPO

## 2011-10-25 DIAGNOSIS — R079 Chest pain, unspecified: Secondary | ICD-10-CM

## 2011-10-25 DIAGNOSIS — I2510 Atherosclerotic heart disease of native coronary artery without angina pectoris: Secondary | ICD-10-CM

## 2011-10-25 DIAGNOSIS — D649 Anemia, unspecified: Secondary | ICD-10-CM

## 2011-10-25 DIAGNOSIS — E785 Hyperlipidemia, unspecified: Secondary | ICD-10-CM

## 2011-10-25 LAB — CBC
HCT: 37.7 % (ref 36.0–46.0)
Hemoglobin: 12.8 g/dL (ref 12.0–15.0)
MCH: 29 pg (ref 26.0–34.0)
MCHC: 34 g/dL (ref 30.0–36.0)
MCV: 85.5 fL (ref 78.0–100.0)
RBC: 4.41 MIL/uL (ref 3.87–5.11)

## 2011-10-25 LAB — BASIC METABOLIC PANEL
BUN: 14 mg/dL (ref 6–23)
CO2: 28 mEq/L (ref 19–32)
Glucose, Bld: 85 mg/dL (ref 70–99)
Potassium: 3.7 mEq/L (ref 3.5–5.1)
Sodium: 141 mEq/L (ref 135–145)

## 2011-10-25 LAB — LIPID PANEL
HDL: 55 mg/dL (ref >39)
LDL Cholesterol: 81 mg/dL (ref 0–99)
Total CHOL/HDL Ratio: 3 RATIO
Triglycerides: 137 mg/dL (ref <150)
VLDL: 27 mg/dL (ref 0–40)

## 2011-10-25 LAB — POCT I-STAT TROPONIN I: Troponin i, poc: 0.01 ng/mL (ref 0.00–0.08)

## 2011-10-25 LAB — TROPONIN I: Troponin I: <0.3 ng/mL (ref <0.30)

## 2011-10-25 MED ORDER — FLUOXETINE HCL 20 MG PO CAPS
40.0000 mg | ORAL_CAPSULE | Freq: Every day | ORAL | Status: DC
  Administered 2011-10-25 – 2011-10-26 (×2): 40 mg via ORAL
  Filled 2011-10-25 (×2): qty 2

## 2011-10-25 MED ORDER — ASPIRIN 325 MG PO TABS
325.0000 mg | ORAL_TABLET | Freq: Every day | ORAL | Status: DC
  Administered 2011-10-25 – 2011-10-26 (×2): 325 mg via ORAL
  Filled 2011-10-25 (×2): qty 1

## 2011-10-25 MED ORDER — TECHNETIUM TC 99M SESTAMIBI GENERIC - CARDIOLITE
30.0000 | Freq: Once | INTRAVENOUS | Status: AC | PRN
  Administered 2011-10-25: 30 via INTRAVENOUS

## 2011-10-25 MED ORDER — ACETAMINOPHEN 325 MG PO TABS
ORAL_TABLET | ORAL | Status: AC
  Filled 2011-10-25: qty 2

## 2011-10-25 MED ORDER — MORPHINE SULFATE 2 MG/ML IJ SOLN: 1.0000 mg | INTRAMUSCULAR | Status: DC | PRN

## 2011-10-25 MED ORDER — ACETAMINOPHEN 325 MG PO TABS
650.0000 mg | ORAL_TABLET | Freq: Four times a day (QID) | ORAL | Status: DC | PRN
  Administered 2011-10-25 (×2): 650 mg via ORAL
  Filled 2011-10-25: qty 2

## 2011-10-25 MED ORDER — TEMAZEPAM 15 MG PO CAPS
30.0000 mg | ORAL_CAPSULE | Freq: Every day | ORAL | Status: DC
  Administered 2011-10-25: 30 mg via ORAL
  Filled 2011-10-25: qty 2

## 2011-10-25 MED ORDER — LORATADINE 10 MG PO TABS
10.0000 mg | ORAL_TABLET | Freq: Every day | ORAL | Status: DC
  Administered 2011-10-25 – 2011-10-26 (×2): 10 mg via ORAL
  Filled 2011-10-25 (×2): qty 1

## 2011-10-25 MED ORDER — ALPRAZOLAM 0.25 MG PO TABS
0.2500 mg | ORAL_TABLET | Freq: Once | ORAL | Status: AC
  Administered 2011-10-25: 0.25 mg via ORAL
  Filled 2011-10-25: qty 1

## 2011-10-25 MED ORDER — ENOXAPARIN SODIUM 40 MG/0.4ML ~~LOC~~ SOLN
40.0000 mg | SUBCUTANEOUS | Status: DC
  Administered 2011-10-25: 40 mg via SUBCUTANEOUS
  Filled 2011-10-25 (×2): qty 0.4

## 2011-10-25 MED ORDER — ONDANSETRON HCL 4 MG PO TABS: 4.0000 mg | ORAL_TABLET | Freq: Four times a day (QID) | ORAL | Status: DC | PRN

## 2011-10-25 MED ORDER — ADULT MULTIVITAMIN W/MINERALS CH
1.0000 | ORAL_TABLET | Freq: Every day | ORAL | Status: DC
  Administered 2011-10-25 – 2011-10-26 (×2): 1 via ORAL
  Filled 2011-10-25 (×2): qty 1

## 2011-10-25 MED ORDER — LEVOTHYROXINE SODIUM 200 MCG PO TABS
200.0000 ug | ORAL_TABLET | Freq: Every day | ORAL | Status: DC
  Administered 2011-10-25 – 2011-10-26 (×2): 200 ug via ORAL
  Filled 2011-10-25 (×3): qty 1

## 2011-10-25 MED ORDER — TEMAZEPAM 15 MG PO CAPS: 30.0000 mg | ORAL_CAPSULE | Freq: Every evening | ORAL | Status: DC | PRN

## 2011-10-25 MED ORDER — ATORVASTATIN CALCIUM 20 MG PO TABS
20.0000 mg | ORAL_TABLET | Freq: Every day | ORAL | Status: DC
  Administered 2011-10-25 – 2011-10-26 (×2): 20 mg via ORAL
  Filled 2011-10-25 (×2): qty 1

## 2011-10-25 MED ORDER — ALBUTEROL SULFATE HFA 108 (90 BASE) MCG/ACT IN AERS
2.0000 | INHALATION_SPRAY | RESPIRATORY_TRACT | Status: DC | PRN
Start: 2011-10-25 — End: 2011-10-26

## 2011-10-25 MED ORDER — LOSARTAN POTASSIUM 50 MG PO TABS
100.0000 mg | ORAL_TABLET | Freq: Every day | ORAL | Status: DC
  Administered 2011-10-25 – 2011-10-26 (×2): 100 mg via ORAL
  Filled 2011-10-25 (×2): qty 2

## 2011-10-25 MED ORDER — ACETAMINOPHEN 650 MG RE SUPP: 650.0000 mg | Freq: Four times a day (QID) | RECTAL | Status: DC | PRN

## 2011-10-25 MED ORDER — ONDANSETRON HCL 4 MG/2ML IJ SOLN: 4.0000 mg | Freq: Four times a day (QID) | INTRAMUSCULAR | Status: DC | PRN

## 2011-10-25 MED ORDER — PANTOPRAZOLE SODIUM 40 MG PO TBEC
80.0000 mg | DELAYED_RELEASE_TABLET | Freq: Every day | ORAL | Status: DC
  Administered 2011-10-25: 80 mg via ORAL
  Filled 2011-10-25: qty 2

## 2011-10-25 MED ORDER — VITAMIN E 180 MG (400 UNIT) PO CAPS
400.0000 [IU] | ORAL_CAPSULE | Freq: Every day | ORAL | Status: DC
  Administered 2011-10-25 – 2011-10-26 (×2): 400 [IU] via ORAL
  Filled 2011-10-25 (×2): qty 1

## 2011-10-25 MED ORDER — DOCUSATE SODIUM 100 MG PO CAPS
100.0000 mg | ORAL_CAPSULE | Freq: Two times a day (BID) | ORAL | Status: DC
  Filled 2011-10-25 (×4): qty 1

## 2011-10-25 NOTE — Progress Notes (Signed)
Pt. Feeling anxious this evening, about being in the hospital and test to be done Dr Tana Coast made aware and orders recived will continue to monitor patient. Jakalyn Kratky, Bettina Gavia RN

## 2011-10-25 NOTE — Progress Notes (Signed)
Patient Name: Nancy [name] Date of Encounter: 10/25/2011  Principal Problem:  *Chest pain Active Problems:  HYPOTHYROIDISM  HYPERLIPIDEMIA  HYPERTENSION  CORONARY ARTERY DISEASE  GERD    SUBJECTIVE: Has had chest pain for most of the last 24 hours, it was gone for a while but restarted a few hours ago, currently 3/10.  OBJECTIVE Filed Vitals:   10/24/11 2020 10/24/11 2245 10/25/11 0021 10/25/11 0553  BP: 100/49 131/56 156/89 144/87  Pulse: 71 61 68 69  Temp:   98.9 F (37.2 C) 98.7 F (37.1 C)  TempSrc:   Oral Oral  Resp: 18 13 20 18   Height:   5' 7"  (1.702 m)   Weight:   297 lb 6.4 oz (134.9 kg)   SpO2: 96% 92% 97% 98%    Intake/Output Summary (Last 24 hours) at 10/25/11 0932 Last data filed at 10/25/11 0730  Gross per 24 hour  Intake      0 ml  Output      0 ml  Net      0 ml   Filed Weights   10/25/11 0021  Weight: 297 lb 6.4 oz (134.9 kg)     PHYSICAL EXAM General: Well developed, well nourished, female in no acute distress. Head: Normocephalic, atraumatic.  Neck: Supple without bruits, JVD not elevated. Lungs:  Resp regular and unlabored, CTA bilaterally. Heart: RRR, S1, S2, no S3, S4, or murmur. Abdomen: Soft, non-tender, non-distended, BS + x 4.  Extremities: No clubbing, cyanosis, no edema.  Neuro: Alert and oriented X 3. Moves all extremities spontaneously. Psych: Normal affect.  LABS: CBC: Basename 10/25/11 0430 10/24/11 1718  WBC 6.2 6.9  NEUTROABS -- --  HGB 12.8 13.7  HCT 37.7 40.5  MCV 85.5 85.6  PLT 140* 671   Basic Metabolic Panel: Basename 24/58/09 0430 10/24/11 1718  NA 141 138  K 3.7 4.3  CL 104 103  CO2 28 24  GLUCOSE 85 112*  BUN 14 18  CREATININE 1.10 1.01  CALCIUM 8.8 9.1  MG -- --  PHOS -- --   Cardiac Enzymes: Basename 10/25/11 0104 10/24/11 2016  CKTOTAL -- --  CKMB -- --  CKMBINDEX -- --  TROPONINI <0.30 <0.30   D-dimer: Basename 10/25/11 0103  DDIMER 0.34   Fasting Lipid Panel: Basename 10/25/11  0430  CHOL 163  HDL 55  LDLCALC 81  TRIG 137  CHOLHDL 3.0  LDLDIRECT --    TELE:  SR       ECG: 24-Oct-2011 17:12:31 [facility]/ED ROUTINE RECORD Normal sinus rhythm Possible Anterior infarct , age undetermined Abnormal ECG No significant change since last tracing 61m/s 149mmV 100Hz  8.0.1 12SL 241 HD CID: 4 Referred by: Confirmed By: ALMylinda LatinaD Vent. rate 81 BPM PR interval 172 ms QRS duration 90 ms QT/QTc 378/439 ms P-R-T axes 56 -11 65  Radiology/Studies: Dg Chest 2 VieW 10/24/2011  *RADIOLOGY REPORT*  Clinical Data: Chest pain and shortness of breath.  CHEST - 2 VIEW  Comparison: Chest x-ray 09/30/2011.  Findings: Lungs appear mildly hyperexpanded with slight flattening of the hemidiaphragms and increased retrosternal air space, suggesting mild COPD.  This is unchanged.  No acute consolidative airspace disease.  No pleural effusions.  Pulmonary vasculature and the cardiomediastinal silhouette are otherwise within normal limits.  Atherosclerosis in the thoracic aorta.  IMPRESSION: 1.  Chronic changes of mild COPD redemonstrated, without radiographic evidence of acute cardiopulmonary disease. 2.  Atherosclerosis.   Original Report Authenticated By: DAEtheleen MayhewM.D.  Current Medications:     . aspirin  325 mg Oral Daily  . atorvastatin  20 mg Oral Daily  . docusate sodium  100 mg Oral BID  . enoxaparin (LOVENOX) injection  40 mg Subcutaneous Q24H  . FLUoxetine  40 mg Oral Daily  . levothyroxine  200 mcg Oral Q0600  . loratadine  10 mg Oral Daily  . losartan  100 mg Oral Daily  .  morphine injection  2 mg Intravenous Once  .  morphine injection  4 mg Intravenous Once  . multivitamin with minerals  1 tablet Oral Daily  . nitroGLYCERIN      . pantoprazole  80 mg Oral Q1200  . temazepam  30 mg Oral QHS  . vitamin E  400 Units Oral Daily      ASSESSMENT AND PLAN: Principal Problem:  *Chest pain - Enzymes negative for MI. GXT MV today  to assess for ischemia. If negative and EF normal, no further eval at this time.  Otherwise, per primary MD. Active Problems:  HYPOTHYROIDISM  HYPERLIPIDEMIA  HYPERTENSION  CORONARY ARTERY DISEASE  GERD   Signed, Rosaria Ferries , PA-C 9:27 AM 10/25/2011  History and all data above reviewed.  Patient examined.  I agree with the findings as above. She has had no further chest pain.  She has no SOB.   The patient exam reveals COR:RRR, no murmur  ,  Lungs: Clear  ,  Abd: Positive bowel sounds, no rebound no guarding, Ext No edema  .  All available labs, radiology testing, previous records reviewed. Agree with documented assessment and plan. No objective evidence of ischemia.  Two day stress test (part two tomorrow).  Home if no evidence of ischemia.   Jeneen Rinks Emerald Shor  4:37 PM  10/25/2011

## 2011-10-25 NOTE — Progress Notes (Addendum)
GXT Myoview performed. 2-day study, will complete tomorrow am.

## 2011-10-25 NOTE — Progress Notes (Signed)
Utilization review complete 

## 2011-10-25 NOTE — Progress Notes (Signed)
Patient ID: Nancy [name]  female  ZJI:[id number]    DOB: [birth year]    DOA: 10/24/2011  PCP: Laurey Morale, MD  Subjective: Currently chest pain resolved, nausea, vomiting, abdominal pain, fever or chills  Objective: Weight change:   Intake/Output Summary (Last 24 hours) at 10/25/11 1348 Last data filed at 10/25/11 0730  Gross per 24 hour  Intake      0 ml  Output      0 ml  Net      0 ml   Blood pressure 103/66, pulse 69, temperature 98.7 F (37.1 C), temperature source Oral, resp. rate 18, height 5' 7"  (1.702 m), weight 134.9 kg (297 lb 6.4 oz), SpO2 98.00%.  Physical Exam: General: Alert and awake, oriented x3, not in any acute distress. HEENT: anicteric sclera, pupils reactive to light and accommodation, EOMI CVS: S1-S2 clear, no murmur rubs or gallops Chest: clear to auscultation bilaterally, no wheezing, rales or rhonchi Abdomen:obese soft nontender, nondistended, normal bowel sounds, no organomegaly Extremities: no cyanosis, clubbing or edema noted bilaterally Neuro: Cranial nerves II-XII intact, no focal neurological deficits  Lab Results: Basic Metabolic Panel:  Lab 17/51/02 0430 10/24/11 1718  NA 141 138  K 3.7 4.3  CL 104 103  CO2 28 24  GLUCOSE 85 112*  BUN 14 18  CREATININE 1.10 1.01  CALCIUM 8.8 9.1  MG -- --  PHOS -- --   CBC:  Lab 10/25/11 0430 10/24/11 1718  WBC 6.2 6.9  NEUTROABS -- --  HGB 12.8 13.7  HCT 37.7 40.5  MCV 85.5 85.6  PLT 140* 173   Cardiac Enzymes:  Lab 10/25/11 1155 10/25/11 0104 10/24/11 2016  CKTOTAL -- -- --  CKMB -- -- --  CKMBINDEX -- -- --  TROPONINI <0.30 <0.30 <0.30   BNP: No components found with this basename: POCBNP:2 CBG: No results found for this basename: GLUCAP:5 in the last 168 hours   Micro Results: No results found for this or any previous visit (from the past 240 hour(s)).  Studies/Results: Dg Chest 2 View  10/24/2011  *RADIOLOGY REPORT*  Clinical Data: Chest pain and shortness of breath.   CHEST - 2 VIEW  Comparison: Chest x-ray 09/30/2011.  Findings: Lungs appear mildly hyperexpanded with slight flattening of the hemidiaphragms and increased retrosternal air space, suggesting mild COPD.  This is unchanged.  No acute consolidative airspace disease.  No pleural effusions.  Pulmonary vasculature and the cardiomediastinal silhouette are otherwise within normal limits.  Atherosclerosis in the thoracic aorta.  IMPRESSION: 1.  Chronic changes of mild COPD redemonstrated, without radiographic evidence of acute cardiopulmonary disease. 2.  Atherosclerosis.   Original Report Authenticated By: Etheleen Mayhew, M.D.    Dg Chest 2 View  09/30/2011  *RADIOLOGY REPORT*  Clinical Data: Fever and shortness of breath.  CHEST - 2 VIEW  Comparison: 11/02/2008.  Findings: Normal sized heart.  Clear lungs.  The lungs remain mildly hyperexpanded with mild prominence of the interstitial markings.  Thoracic spine degenerative changes.  IMPRESSION: Mild changes of COPD.  No acute abnormality.   Original Report Authenticated By: Gerald Stabs, M.D.     Medications: Scheduled Meds:   . acetaminophen      . aspirin  325 mg Oral Daily  . atorvastatin  20 mg Oral Daily  . docusate sodium  100 mg Oral BID  . enoxaparin (LOVENOX) injection  40 mg Subcutaneous Q24H  . FLUoxetine  40 mg Oral Daily  . levothyroxine  200 mcg Oral Q0600  .  loratadine  10 mg Oral Daily  . losartan  100 mg Oral Daily  .  morphine injection  2 mg Intravenous Once  .  morphine injection  4 mg Intravenous Once  . multivitamin with minerals  1 tablet Oral Daily  . nitroGLYCERIN      . pantoprazole  80 mg Oral Q1200  . temazepam  30 mg Oral QHS  . vitamin E  400 Units Oral Daily   Continuous Infusions:    Assessment/Plan: Principal Problem:  *Chest pain: high risk factors for nonocclusive CAD, hyperlipidemia, obesity - 2 part stress test pending, serial cardiac enzymes negative for acute ACS, d-dimer negative - LDL 81 -  continue aspirin, Lipitor, Cozaar  Active Problems:  HYPOTHYROIDISM: Continue Synthroid   HYPERLIPIDEMIA: LDL 81, continue Lipitor   HYPERTENSION:Stable   CORONARY ARTERY DISEASE: stress test pending   GERD: continue PPI  DVT Prophylaxis:Lovenox  Code Status:full code  Disposition:hopefully tomorrow   LOS: 1 day   Robin Petrakis M.D. [facility] 10/25/2011, 1:48 PM Pager: [phone]  If 7PM-7AM, please contact night-coverage [email or web address] Password [email or web address]

## 2011-10-26 ENCOUNTER — Observation Stay (HOSPITAL_COMMUNITY): Payer: BC Managed Care – PPO

## 2011-10-26 MED ORDER — NITROGLYCERIN 0.4 MG SL SUBL: 0.4000 mg | SUBLINGUAL_TABLET | SUBLINGUAL | Status: DC | PRN

## 2011-10-26 MED ORDER — TECHNETIUM TC 99M SESTAMIBI - CARDIOLITE
30.0000 | Freq: Once | INTRAVENOUS | Status: AC | PRN
  Administered 2011-10-26: 30 via INTRAVENOUS

## 2011-10-26 NOTE — Progress Notes (Signed)
Paged MD that Stress Test results are available. Anticipate discharge home with no needs. [employer] [employer]

## 2011-10-26 NOTE — Progress Notes (Signed)
Reviewed discharge instructions with patient and daughter. Gave Rx to patient and explained how to use Nitrostat SL tabs. Patient able to teach back. Patient discharged to home. Patient taken to [facility] entrance via [place] for daughter to drive home. All discharge education complete. [employer] [employer]

## 2011-10-26 NOTE — Discharge Summary (Signed)
Physician Discharge Summary  Patient ID: Nancy [name] MRN: [id number] DOB/AGE: 1955/06/10 56 y.o.  Admit date: 10/24/2011 Discharge date: 10/26/2011  Primary Care Physician:  Laurey Morale, MD  Discharge Diagnoses:    .Chest pain: ACS rule out, likely GERD/esophagitis .HYPOTHYROIDISM .HYPERLIPIDEMIA .HYPERTENSION .CORONARY ARTERY DISEASE .GERD  Consults:  Cardiology consultation   Discharge Medications:   Medication List     As of 10/26/2011  3:36 PM    TAKE these medications         albuterol 108 (90 BASE) MCG/ACT inhaler   Commonly known as: PROVENTIL HFA;VENTOLIN HFA   Inhale 2 puffs into the lungs every 4 (four) hours as needed for shortness of breath.      aspirin 325 MG tablet   Take 325 mg by mouth daily.      atorvastatin 20 MG tablet   Commonly known as: LIPITOR   Take 1 tablet (20 mg total) by mouth daily.      Black Cohosh 40 MG Caps   Take 1 capsule by mouth daily.      CALCIUM ANTACID ULTRA 400 MG tablet   Generic drug: calcium elemental as carbonate   Chew 1,000 mg by mouth daily.      cetirizine 10 MG tablet   Commonly known as: ZYRTEC   Take 10 mg by mouth daily.      esomeprazole 40 MG capsule   Commonly known as: NEXIUM   Take 1 capsule (40 mg total) by mouth daily before breakfast.      FLUoxetine 40 MG capsule   Commonly known as: PROZAC   Take 1 capsule (40 mg total) by mouth daily.      levothyroxine 200 MCG tablet   Commonly known as: SYNTHROID, LEVOTHROID   Take 1 tablet (200 mcg total) by mouth daily.      losartan 100 MG tablet   Commonly known as: COZAAR   Take 1 tablet (100 mg total) by mouth daily.      multivitamin tablet   Take 1 tablet by mouth daily.      nitroGLYCERIN 0.4 MG SL tablet   Commonly known as: NITROSTAT   Place 1 tablet (0.4 mg total) under the tongue every 5 (five) minutes as needed for chest pain.      temazepam 30 MG capsule   Commonly known as: RESTORIL   Take 1 capsule (30 mg total) by mouth  at bedtime as needed for sleep.      triamcinolone 55 MCG/ACT nasal inhaler   Commonly known as: NASACORT   Place 2 sprays into the nose daily.      vitamin E 400 UNIT capsule   Generic drug: vitamin E   Take 400 Units by mouth daily.         Brief H and P: For complete details please refer to admission H and P, but in briefKelly [name] is a 56 y.o. Caucasian female history of coronary artery disease (nonobstructive? per patient's history) status post recent catheter approximately 3-4 years ago at [facility] with 30% blockage at unknown vessel, GERD, hypertension, hyperlipidemia, hypothyroidism, and history of tobacco use who presents with the above complaints. Patient works at [employer] for third shift, she woke up on the morning of admission at 10 a.m. with chest pain, she thought she had indigestion and took some TUMS. She later woke up again with persistent chest pain with some radiating pain to bilateral shoulder blades as a result she presented to the emergency department for further  evaluation. She reports that about a month ago she had some chest pain. Over the last month she has been feeling weak and fatigued but may be due to an upper respiratory viral syndrome. Prior to coming to the emergency department the patient had taken expired nitroglycerin without any relief. On presentation to the emergency department patient received some morphine and nitroglycerin with some relief.   Hospital Course:   Chest pain: resolved likely esophagitis/gastritis, high risk factors, nonocclusive CAD, hyperlipidemia, obesity, hypertension hence patient was placed under observation for risk ratification. Serial cardiac enzymes priornegative for acute ACS, d-dimer was negative. Lipid panel showed LDL 81. Patient was continued on aspirin, Lipitor, Cozaar. Cardiology consultation was obtained for his stratification and Lexi scan Myoview stress test. Stress test the showed no fixed or  reversible defects she should just inducible ischemia, normal left ventricular wall motion, low normal ejection fraction.  HYPOTHYROIDISM: Continue Synthroid  HYPERLIPIDEMIA: LDL 81, continue Lipitor  HYPERTENSION:Stable  GERD: continue PPI   Day of Discharge BP 116/55  Pulse 65  Temp 98.4 F (36.9 C) (Oral)  Resp 19  Ht 5' 7"  (1.702 m)  Wt 134.9 kg (297 lb 6.4 oz)  BMI 46.58 kg/m2  SpO2 98%  Physical Exam: General: Alert and awake oriented x3 not in any acute distress. HEENT: anicteric sclera, pupils reactive to light and accommodation CVS: S1-S2 clear no murmur rubs or gallops Chest: clear to auscultation bilaterally, no wheezing rales or rhonchi Abdomen: soft nontender, nondistended, normal bowel sounds, no organomegaly Extremities: no cyanosis, clubbing or edema noted bilaterally Neuro: Cranial nerves II-XII intact, no focal neurological deficits   The results of significant diagnostics from this hospitalization (including imaging, microbiology, ancillary and laboratory) are listed below for reference.    LAB RESULTS: Basic Metabolic Panel:  Lab 29/52/84 0430 10/24/11 1718  NA 141 138  K 3.7 4.3  CL 104 103  CO2 28 24  GLUCOSE 85 112*  BUN 14 18  CREATININE 1.10 1.01  CALCIUM 8.8 9.1  MG -- --  PHOS -- --   CBC:  Lab 10/25/11 0430 10/24/11 1718  WBC 6.2 6.9  NEUTROABS -- --  HGB 12.8 13.7  HCT 37.7 40.5  MCV 85.5 --  PLT 140* 173   Cardiac Enzymes:  Lab 10/25/11 1155 10/25/11 0104  CKTOTAL -- --  CKMB -- --  CKMBINDEX -- --  TROPONINI <0.30 <0.30   Significant Diagnostic Studies:  No results found.   Disposition and Follow-up:     Discharge Orders    Future Orders Please Complete By Expires   Diet - low sodium heart healthy      Increase activity slowly          DISPOSITION:home DIET:heart healthy diet  ACTIVITY: as tolerated  DISCHARGE FOLLOW-UP Follow-up Information    Follow up with FRY,STEPHEN A, MD. Schedule an appointment  as soon as possible for a visit in 2 weeks. (for hospital follow-up)    Contact information:   [place] [address] [phone]          Time spent on Discharge: 32 minutes  Signed:   RAI,RIPUDEEP M.D. [facility] 10/26/2011, 3:36 PM Pager: [phone]

## 2011-10-26 NOTE — Progress Notes (Signed)
Patient has returned from Nuclear Medicine. Second portion of Stress Test complete. [employer]

## 2012-01-14 ENCOUNTER — Ambulatory Visit (INDEPENDENT_AMBULATORY_CARE_PROVIDER_SITE_OTHER): Payer: BC Managed Care – PPO | Admitting: Family Medicine

## 2012-01-14 DIAGNOSIS — Z23 Encounter for immunization: Secondary | ICD-10-CM

## 2012-01-14 DIAGNOSIS — Z0000 Encounter for general adult medical examination without abnormal findings: Secondary | ICD-10-CM

## 2012-03-04 ENCOUNTER — Other Ambulatory Visit: Payer: Self-pay | Admitting: Family Medicine

## 2012-03-04 NOTE — Telephone Encounter (Signed)
Pt needs new rx temazepam 30 mg one pill at night #90 with 3 [facility]

## 2012-03-04 NOTE — Telephone Encounter (Signed)
Call in #90 with one rf

## 2012-03-05 MED ORDER — TEMAZEPAM 30 MG PO CAPS: 30.0000 mg | ORAL_CAPSULE | Freq: Every evening | ORAL | Status: DC | PRN

## 2012-03-05 NOTE — Telephone Encounter (Signed)
Script was printed and faxed.

## 2012-03-05 NOTE — Addendum Note (Signed)
Addended by: Aggie Hacker A on: 03/05/2012 12:51 PM   Modules accepted: Orders

## 2012-03-14 ENCOUNTER — Other Ambulatory Visit: Payer: Self-pay

## 2012-04-02 ENCOUNTER — Telehealth: Payer: Self-pay | Admitting: Family Medicine

## 2012-04-02 NOTE — Telephone Encounter (Signed)
Caller: Nancy [name]/Patient; Phone: [phone]; Reason for Call: She said she went to fill her Nexium and it has gone up to $600 out of pocket.  She is wondering if he could write for something less expensive.  She said even if it is something over the counter if she has a Rx that [facility] will fill on their $4 plan

## 2012-04-04 NOTE — Telephone Encounter (Signed)
Pls advise.  

## 2012-04-06 NOTE — Telephone Encounter (Signed)
Call in Ranitidine 300 mg bid for a year. This is $10 at [facility] for 90 days

## 2012-04-07 MED ORDER — RANITIDINE HCL 300 MG PO CAPS: 300.0000 mg | ORAL_CAPSULE | Freq: Two times a day (BID) | ORAL | Status: DC

## 2012-04-07 NOTE — Telephone Encounter (Signed)
I spoke with pt and sent script e-scribe to [facility] per pt request.

## 2012-04-09 ENCOUNTER — Other Ambulatory Visit: Payer: Self-pay

## 2012-04-09 DIAGNOSIS — Z1231 Encounter for screening mammogram for malignant neoplasm of breast: Secondary | ICD-10-CM

## 2012-05-13 ENCOUNTER — Ambulatory Visit
Admission: RE | Admit: 2012-05-13 | Discharge: 2012-05-13 | Disposition: A | Discharge status: Home / Self Care | Payer: BC Managed Care – PPO | Source: Ambulatory Visit

## 2012-05-13 DIAGNOSIS — Z1231 Encounter for screening mammogram for malignant neoplasm of breast: Secondary | ICD-10-CM

## 2012-08-15 ENCOUNTER — Other Ambulatory Visit: Payer: Self-pay | Admitting: Family Medicine

## 2012-08-23 ENCOUNTER — Other Ambulatory Visit: Payer: Self-pay | Admitting: Family Medicine

## 2012-09-24 ENCOUNTER — Encounter (HOSPITAL_COMMUNITY): Payer: Self-pay | Admitting: Emergency Medicine

## 2012-09-24 ENCOUNTER — Emergency Department (INDEPENDENT_AMBULATORY_CARE_PROVIDER_SITE_OTHER)
Admission: EM | Admit: 2012-09-24 | Discharge: 2012-09-24 | Disposition: A | Discharge status: Nursing Home (Includes ICF) | Payer: BC Managed Care – PPO | Source: Home / Self Care

## 2012-09-24 ENCOUNTER — Emergency Department (HOSPITAL_COMMUNITY)
Admission: EM | Admit: 2012-09-24 | Discharge: 2012-09-24 | Disposition: A | Discharge status: Home / Self Care | Payer: BC Managed Care – PPO | Attending: Emergency Medicine | Admitting: Emergency Medicine

## 2012-09-24 ENCOUNTER — Emergency Department (HOSPITAL_COMMUNITY): Payer: BC Managed Care – PPO

## 2012-09-24 ENCOUNTER — Encounter (HOSPITAL_COMMUNITY): Payer: Self-pay | Admitting: *Deleted

## 2012-09-24 ENCOUNTER — Other Ambulatory Visit: Payer: Self-pay

## 2012-09-24 DIAGNOSIS — Z862 Personal history of diseases of the blood and blood-forming organs and certain disorders involving the immune mechanism: Secondary | ICD-10-CM | POA: Insufficient documentation

## 2012-09-24 DIAGNOSIS — E039 Hypothyroidism, unspecified: Secondary | ICD-10-CM | POA: Insufficient documentation

## 2012-09-24 DIAGNOSIS — R079 Chest pain, unspecified: Secondary | ICD-10-CM

## 2012-09-24 DIAGNOSIS — Z79899 Other long term (current) drug therapy: Secondary | ICD-10-CM | POA: Insufficient documentation

## 2012-09-24 DIAGNOSIS — K219 Gastro-esophageal reflux disease without esophagitis: Secondary | ICD-10-CM | POA: Insufficient documentation

## 2012-09-24 DIAGNOSIS — Z8679 Personal history of other diseases of the circulatory system: Secondary | ICD-10-CM | POA: Insufficient documentation

## 2012-09-24 DIAGNOSIS — I209 Angina pectoris, unspecified: Secondary | ICD-10-CM | POA: Insufficient documentation

## 2012-09-24 DIAGNOSIS — Z9861 Coronary angioplasty status: Secondary | ICD-10-CM | POA: Insufficient documentation

## 2012-09-24 DIAGNOSIS — J4489 Other specified chronic obstructive pulmonary disease: Secondary | ICD-10-CM | POA: Insufficient documentation

## 2012-09-24 DIAGNOSIS — I2510 Atherosclerotic heart disease of native coronary artery without angina pectoris: Secondary | ICD-10-CM | POA: Insufficient documentation

## 2012-09-24 DIAGNOSIS — R1013 Epigastric pain: Secondary | ICD-10-CM

## 2012-09-24 DIAGNOSIS — E785 Hyperlipidemia, unspecified: Secondary | ICD-10-CM | POA: Insufficient documentation

## 2012-09-24 DIAGNOSIS — Z7982 Long term (current) use of aspirin: Secondary | ICD-10-CM | POA: Insufficient documentation

## 2012-09-24 DIAGNOSIS — R110 Nausea: Secondary | ICD-10-CM | POA: Insufficient documentation

## 2012-09-24 DIAGNOSIS — I10 Essential (primary) hypertension: Secondary | ICD-10-CM | POA: Insufficient documentation

## 2012-09-24 DIAGNOSIS — I252 Old myocardial infarction: Secondary | ICD-10-CM | POA: Insufficient documentation

## 2012-09-24 DIAGNOSIS — J449 Chronic obstructive pulmonary disease, unspecified: Secondary | ICD-10-CM | POA: Insufficient documentation

## 2012-09-24 DIAGNOSIS — M549 Dorsalgia, unspecified: Secondary | ICD-10-CM | POA: Insufficient documentation

## 2012-09-24 LAB — BASIC METABOLIC PANEL
CO2: 27 mEq/L (ref 19–32)
Calcium: 8.7 mg/dL (ref 8.4–10.5)
Creatinine, Ser: 1.11 mg/dL — ABNORMAL HIGH (ref 0.50–1.10)
GFR calc Af Amer: 64 mL/min — ABNORMAL LOW (ref >90)
GFR calc non Af Amer: 55 mL/min — ABNORMAL LOW (ref >90)
Sodium: 140 mEq/L (ref 135–145)

## 2012-09-24 LAB — CBC WITH DIFFERENTIAL/PLATELET
Basophils Absolute: 0 10*3/uL (ref 0.0–0.1)
Basophils Relative: 1 % (ref 0–1)
Eosinophils Absolute: 0.2 10*3/uL (ref 0.0–0.7)
MCH: 29 pg (ref 26.0–34.0)
MCHC: 35.1 g/dL (ref 30.0–36.0)
Neutro Abs: 3.3 10*3/uL (ref 1.7–7.7)
Neutrophils Relative %: 66 % (ref 43–77)
Platelets: 129 10*3/uL — ABNORMAL LOW (ref 150–400)

## 2012-09-24 LAB — HEPATIC FUNCTION PANEL
Albumin: 3.8 g/dL (ref 3.5–5.2)
Alkaline Phosphatase: 68 U/L (ref 39–117)
Indirect Bilirubin: 0.4 mg/dL (ref 0.3–0.9)
Total Protein: 6.5 g/dL (ref 6.0–8.3)

## 2012-09-24 LAB — LIPASE, BLOOD: Lipase: 53 U/L (ref 11–59)

## 2012-09-24 LAB — POCT I-STAT TROPONIN I: Troponin i, poc: 0.01 ng/mL (ref 0.00–0.08)

## 2012-09-24 MED ORDER — MORPHINE SULFATE 4 MG/ML IJ SOLN
4.0000 mg | Freq: Once | INTRAMUSCULAR | Status: AC
  Administered 2012-09-24: 4 mg via INTRAVENOUS
  Filled 2012-09-24: qty 1

## 2012-09-24 MED ORDER — ONDANSETRON HCL 4 MG/2ML IJ SOLN
4.0000 mg | Freq: Once | INTRAMUSCULAR | Status: AC
  Administered 2012-09-24: 4 mg via INTRAVENOUS
  Filled 2012-09-24: qty 2

## 2012-09-24 MED ORDER — SUCRALFATE 1 G PO TABS
1.0000 g | ORAL_TABLET | ORAL | Status: AC
  Administered 2012-09-24: 1 g via ORAL
  Filled 2012-09-24: qty 1

## 2012-09-24 MED ORDER — SODIUM CHLORIDE 0.9 % IV SOLN: INTRAVENOUS | Status: DC

## 2012-09-24 MED ORDER — SUCRALFATE 1 G PO TABS: 1.0000 g | ORAL_TABLET | Freq: Four times a day (QID) | ORAL | Status: DC

## 2012-09-24 MED ORDER — NITROGLYCERIN 0.4 MG SL SUBL
SUBLINGUAL_TABLET | SUBLINGUAL | Status: AC
  Filled 2012-09-24: qty 25

## 2012-09-24 MED ORDER — IOHEXOL 350 MG/ML SOLN
100.0000 mL | Freq: Once | INTRAVENOUS | Status: AC | PRN
  Administered 2012-09-24: 100 mL via INTRAVENOUS

## 2012-09-24 MED ORDER — NITROGLYCERIN 0.4 MG SL SUBL
0.4000 mg | SUBLINGUAL_TABLET | SUBLINGUAL | Status: DC | PRN
  Administered 2012-09-24: 0.4 mg via SUBLINGUAL

## 2012-09-24 MED ORDER — GI COCKTAIL ~~LOC~~
30.0000 mL | Freq: Once | ORAL | Status: AC
  Administered 2012-09-24: 30 mL via ORAL
  Filled 2012-09-24: qty 30

## 2012-09-24 NOTE — ED Notes (Addendum)
Pt discharged.Vital signs stable and GCS 15 

## 2012-09-24 NOTE — ED Notes (Signed)
[facility] does not have a truck avail... Called EMS

## 2012-09-24 NOTE — ED Notes (Signed)
Pt c/o CP/epigastric pain onset last night Reports pain is constant and radiates to the back Hx of MI, HTN... Had aspirin before coming here Alert and talking in complete sentences w/no signs of acute distress.

## 2012-09-24 NOTE — ED Provider Notes (Signed)
Medical screening examination/treatment/procedure(s) were performed by resident physician or non-physician practitioner and as supervising physician I was immediately available for consultation/collaboration.   Pauline Good MD.   Billy Fischer, MD 09/24/12 2055

## 2012-09-24 NOTE — ED Provider Notes (Signed)
CSN: [id number]     Arrival date & time 09/24/12  1753 History   First MD Initiated Contact with Patient 09/24/12 1801     Chief Complaint  Patient presents with  . Chest Pain   (Consider location/radiation/quality/duration/timing/severity/associated sxs/prior Treatment) HPI Comments: Patient is a 57 year old female with a past medical history of CAD, previous MI in 2001, GERD, hypertension and hyperlipidemia who presents with abdominal pain since last night. The pain is located in her epigastrium and radiates to her back and up into her central chest a bit. The pain is described as aching and severe. The pain started suddenly and remained constant since the onset. No alleviating/aggravating factors. The patient has tried ASA for symptoms without relief. Associated symptoms include nausea. Patient denies fever, headache, vomiting, diarrhea, chest pain, SOB, dysuria, constipation, abnormal vaginal bleeding/discharge.      Past Medical History  Diagnosis Date  . Allergic rhinitis     gets shots per Dr. Caprice Red  . GERD (gastroesophageal reflux disease)   . Hyperlipidemia   . Hypertension   . Hypothyroidism   . Coronary artery disease     had MI in 2001, seees Dr. Fletcher Anon at [facility]  . History of cardiac catheterization 07-24-07    showed nonconclusive disease  . Gynecological examination     sees Dr. Cherylann Banas   . Anginal pain   . Myocardial infarction 2001?  Marland Kitchen COPD (chronic obstructive pulmonary disease)     "CXR just showed mild COPD" (10/24/2011)  . Migraines     "have a history of migraines; haven't had one for years" (10/24/2011)  . Anemia 2001   Past Surgical History  Procedure Laterality Date  . Cesarean section  1984  . Thyroidectomy  1990's  . Tonsillectomy      "when I was a kid"  . Hernia repair  ~ 2007    ventral hernia repair  . Cardiac catheterization  2009   Family History  Problem Relation Age of Onset  . Cancer Other     breast,colon,prostate   . Alcohol abuse Other   . Hyperlipidemia Other   . Hypertension Other   . Stroke Other   . Heart disease Other   . Breast cancer Mother   . Pulmonary fibrosis Mother   . Coronary artery disease Father   . Hypertension Father    History  Substance Use Topics  . Smoking status: Former Smoker -- 1.00 packs/day for 30 years    Types: Cigarettes    Quit date: 10/24/2006  . Smokeless tobacco: Never Used  . Alcohol Use: No   OB History   Grav Para Term Preterm Abortions TAB SAB Ect Mult Living                 Review of Systems  Gastrointestinal: Positive for nausea and abdominal pain.  All other systems reviewed and are negative.    Allergies  Sulfamethoxazole  Home Medications   Current Outpatient Rx  Name  Route  Sig  Dispense  Refill  . albuterol (PROVENTIL HFA;VENTOLIN HFA) 108 (90 BASE) MCG/ACT inhaler   Inhalation   Inhale 2 puffs into the lungs every 4 (four) hours as needed for shortness of breath.   3 Inhaler   3   . aspirin 325 MG tablet   Oral   Take 325 mg by mouth daily.           Marland Kitchen atorvastatin (LIPITOR) 20 MG tablet   Oral   Take 20  mg by mouth daily.         . Black Cohosh 40 MG CAPS   Oral   Take 1 capsule by mouth daily.           . calcium elemental as carbonate (CALCIUM ANTACID ULTRA) 400 MG tablet   Oral   Chew 1,000 mg by mouth daily.           . cetirizine (ZYRTEC) 10 MG tablet   Oral   Take 10 mg by mouth daily.         Marland Kitchen FLUoxetine (PROZAC) 40 MG capsule   Oral   Take 40 mg by mouth daily.         Marland Kitchen levothyroxine (SYNTHROID, LEVOTHROID) 200 MCG tablet   Oral   Take 200 mcg by mouth daily.         Marland Kitchen losartan (COZAAR) 100 MG tablet   Oral   Take 100 mg by mouth daily.         . Multiple Vitamin (MULTIVITAMIN) tablet   Oral   Take 1 tablet by mouth daily.           . nitroGLYCERIN (NITROSTAT) 0.4 MG SL tablet   Sublingual   Place 1 tablet (0.4 mg total) under the tongue every 5 (five) minutes as needed  for chest pain.   30 tablet   3   . ranitidine (ZANTAC) 300 MG capsule   Oral   Take 1 capsule (300 mg total) by mouth 2 (two) times daily.   180 capsule   2   . temazepam (RESTORIL) 30 MG capsule   Oral   Take 1 capsule (30 mg total) by mouth at bedtime as needed for sleep.   90 capsule   1   . triamcinolone (NASACORT) 55 MCG/ACT nasal inhaler   Nasal   Place 2 sprays into the nose daily.          . vitamin E (VITAMIN E) 400 UNIT capsule   Oral   Take 400 Units by mouth daily.            BP 108/71  Pulse 71  Temp(Src) 98.8 F (37.1 C) (Oral)  Resp 20  SpO2 98% Physical Exam  Nursing note and vitals reviewed. Constitutional: She is oriented to person, place, and time. She appears well-developed and well-nourished. No distress.  HENT:  Head: Normocephalic and atraumatic.  Eyes: Conjunctivae and EOM are normal. No scleral icterus.  Neck: Normal range of motion.  Cardiovascular: Normal rate and regular rhythm.  Exam reveals no gallop and no friction rub.   No murmur heard. Pulmonary/Chest: Effort normal and breath sounds normal. She has no wheezes. She has no rales. She exhibits no tenderness.  Abdominal: Soft. She exhibits no distension. There is tenderness. There is no rebound and no guarding.  Epigastric tenderness to palpation. No peritoneal signs or other focal tenderness to palpation.   Musculoskeletal: Normal range of motion.  Neurological: She is alert and oriented to person, place, and time. Coordination normal.  Speech is goal-oriented. Moves limbs without ataxia.   Skin: Skin is warm and dry.  Psychiatric: She has a normal mood and affect. Her behavior is normal.    ED Course  Procedures (including critical care time)   Date: 09/24/2012  Rate: 71  Rhythm: normal sinus rhythm  QRS Axis: normal  Intervals: normal  ST/T Wave abnormalities: normal  Conduction Disutrbances:none  Narrative Interpretation: NSR unchanged from previous  Old EKG  Reviewed: unchanged  Labs Review Labs Reviewed  CBC WITH DIFFERENTIAL - Abnormal; Notable for the following:    Platelets 129 (*)    All other components within normal limits  BASIC METABOLIC PANEL - Abnormal; Notable for the following:    Glucose, Bld 124 (*)    Creatinine, Ser 1.11 (*)    GFR calc non Af Amer 55 (*)    GFR calc Af Amer 64 (*)    All other components within normal limits  HEPATIC FUNCTION PANEL  LIPASE, BLOOD  POCT I-STAT TROPONIN I   Imaging Review Dg Chest 2 View  09/24/2012   *RADIOLOGY REPORT*  Clinical Data: Chest pain since last night, history smoking, hypertension, hypercholesterolemia, coronary artery disease post MI, COPD  CHEST - 2 VIEW  Comparison: 10/24/2011  Findings: Upper-normal size of cardiac silhouette. Mediastinal contours and pulmonary vascularity normal. Emphysematous changes without infiltrate, pleural effusion or pneumothorax. No acute osseous findings.  IMPRESSION: Probable COPD changes. No acute abnormalities.   Original Report Authenticated By: Lavonia Dana, M.D.    MDM   1. Epigastric pain     7:03 PM Labs, chest xray and troponin pending. EKG shows no acute changes. Vitals stable and patient afebrile. Patient given morphine and zofran here for symptoms.   8:03 PM Labs, chest xray and troponin unremarkable. Patient will not need another troponin here since the pain has been constant for almost 24 hours. Patient discussed with Dr. Leonides Schanz who would like to rule out aortic dissection before discharge since other labs are unremarkable. Patient will have CT of chest and abdomen to rule out aortic dissection. Patient signed out to Junius Creamer, NP for disposition.      Alvina Chou, PA-C 09/25/12 1115

## 2012-09-24 NOTE — ED Provider Notes (Signed)
Patient presents to the emergency department, with epigastric pain, nausea, previous providers felt that she was at high risk for a dissection.  CT scan, shows that there is no dissection.  She did receive them as pain relief from a GI cocktail, and Carafate.  She does have a history of gastric, reflux.  She'll be sent home with a prescription for Carafate.  She is to stop the Zantac.  She is to followup with her primary care physician and get a GI.  Referral  Garald Balding, NP 09/24/12 2223

## 2012-09-24 NOTE — ED Notes (Signed)
Iv  Ns  tko  20  Angio  r   Arm   1  Att   By this  Probation officer

## 2012-09-24 NOTE — ED Notes (Signed)
2 unsuccessful IV attempts... Nicole Kindred, RN asked to assit.

## 2012-09-24 NOTE — ED Provider Notes (Signed)
CSN: [id number]     Arrival date & time 09/24/12  1626 History   None    Chief Complaint  Patient presents with  . Chest Pain   (Consider location/radiation/quality/duration/timing/severity/associated sxs/prior Treatment) HPI Comments: 57 year old female presents complaining of chest pain that started last night with associated nausea. This chest pain radiates straight through to her back. Started last night and has not let up at all. She also states she feels very jumpy and jittery. She has a history of an MI about 10 years ago. She denies shortness of breath, vomiting, diarrhea, cough. She had 325 mg of aspirin prior to arrival   Past Medical History  Diagnosis Date  . Allergic rhinitis     gets shots per Dr. Caprice Red  . GERD (gastroesophageal reflux disease)   . Hyperlipidemia   . Hypertension   . Hypothyroidism   . Coronary artery disease     had MI in 2001, seees Dr. Fletcher Anon at [facility]  . History of cardiac catheterization 07-24-07    showed nonconclusive disease  . Gynecological examination     sees Dr. Cherylann Banas   . Anginal pain   . Myocardial infarction 2001?  Marland Kitchen COPD (chronic obstructive pulmonary disease)     "CXR just showed mild COPD" (10/24/2011)  . Migraines     "have a history of migraines; haven't had one for years" (10/24/2011)  . Anemia 2001   Past Surgical History  Procedure Laterality Date  . Cesarean section  1984  . Thyroidectomy  1990's  . Tonsillectomy      "when I was a kid"  . Hernia repair  ~ 2007    ventral hernia repair  . Cardiac catheterization  2009   Family History  Problem Relation Age of Onset  . Cancer Other     breast,colon,prostate  . Alcohol abuse Other   . Hyperlipidemia Other   . Hypertension Other   . Stroke Other   . Heart disease Other   . Breast cancer Mother   . Pulmonary fibrosis Mother   . Coronary artery disease Father   . Hypertension Father    History  Substance Use Topics  . Smoking status: Former  Smoker -- 1.00 packs/day for 30 years    Types: Cigarettes    Quit date: 10/24/2006  . Smokeless tobacco: Never Used  . Alcohol Use: No   OB History   Grav Para Term Preterm Abortions TAB SAB Ect Mult Living                 Review of Systems  Constitutional: Negative for fever and chills.  Eyes: Negative for visual disturbance.  Respiratory: Negative for cough and shortness of breath.   Cardiovascular: Positive for chest pain. Negative for palpitations and leg swelling.  Gastrointestinal: Positive for nausea. Negative for vomiting and abdominal pain.  Endocrine: Negative for polydipsia and polyuria.  Genitourinary: Negative for dysuria, urgency and frequency.  Musculoskeletal: Negative for myalgias and arthralgias.  Skin: Negative for rash.  Neurological: Negative for dizziness, weakness and light-headedness.  Psychiatric/Behavioral: The patient is nervous/anxious.     Allergies  Sulfamethoxazole  Home Medications   Current Outpatient Rx  Name  Route  Sig  Dispense  Refill  . albuterol (PROVENTIL HFA;VENTOLIN HFA) 108 (90 BASE) MCG/ACT inhaler   Inhalation   Inhale 2 puffs into the lungs every 4 (four) hours as needed for shortness of breath.   3 Inhaler   3   . aspirin 325 MG tablet  Oral   Take 325 mg by mouth daily.           Marland Kitchen atorvastatin (LIPITOR) 20 MG tablet      TAKE 1 TABLET DAILY   90 tablet   0   . Black Cohosh 40 MG CAPS   Oral   Take 1 capsule by mouth daily.           . calcium elemental as carbonate (CALCIUM ANTACID ULTRA) 400 MG tablet   Oral   Chew 1,000 mg by mouth daily.           . cetirizine (ZYRTEC) 10 MG tablet   Oral   Take 10 mg by mouth daily.         Marland Kitchen FLUoxetine (PROZAC) 40 MG capsule      TAKE 1 CAPSULE DAILY   90 capsule   2   . levothyroxine (SYNTHROID, LEVOTHROID) 200 MCG tablet      TAKE 1 TABLET DAILY   90 tablet   2   . losartan (COZAAR) 100 MG tablet      TAKE 1 TABLET DAILY (PLEASE ADVISE PATIENT  TO SCHEDULE COMPLETE PHYSICAL APPOINTMENT WITHIN NEXT 2 MONTHS)   90 tablet   2   . Multiple Vitamin (MULTIVITAMIN) tablet   Oral   Take 1 tablet by mouth daily.           . nitroGLYCERIN (NITROSTAT) 0.4 MG SL tablet   Sublingual   Place 1 tablet (0.4 mg total) under the tongue every 5 (five) minutes as needed for chest pain.   30 tablet   3   . ranitidine (ZANTAC) 300 MG capsule   Oral   Take 1 capsule (300 mg total) by mouth 2 (two) times daily.   180 capsule   2   . EXPIRED: temazepam (RESTORIL) 30 MG capsule   Oral   Take 1 capsule (30 mg total) by mouth at bedtime as needed for sleep.   90 capsule   1   . triamcinolone (NASACORT) 55 MCG/ACT nasal inhaler   Nasal   Place 2 sprays into the nose daily.           . vitamin E (VITAMIN E) 400 UNIT capsule   Oral   Take 400 Units by mouth daily.            BP 126/84  Pulse 93  Temp(Src) 98.9 F (37.2 C) (Oral)  Resp 16  SpO2 100% Physical Exam  Nursing note and vitals reviewed. Constitutional: She is oriented to person, place, and time. She appears well-developed and well-nourished. No distress.  HENT:  Head: Normocephalic and atraumatic.  Pulmonary/Chest: Effort normal. No respiratory distress.  Neurological: She is alert and oriented to person, place, and time. Coordination normal.  Skin: Skin is warm and dry. No rash noted. She is not diaphoretic.  Psychiatric: She has a normal mood and affect. Judgment normal.    ED Course  Procedures (including critical care time) Labs Review Labs Reviewed - No data to display Imaging Review No results found.  MDM   1. Chest pain    Patient has a history of MI. Chest pain is typical for ACS, EKG unremarkable. Transferred to the emergency department for workup    Liam Graham, PA-C 09/24/12 1731

## 2012-09-24 NOTE — ED Notes (Signed)
Pt in via EMS- per EMS, pt in from urgent care, pt went there for evaluation for chest pain that woke her from sleep this am, states the pain radiates into her back between her shoulder blades, denies vomiting but c/o nausea, c/o dizziness, deneis shortness of breath, points to epigastric area when describing pain, took tums last night without relief, took ASA prior to arrival to urgent care, given nitro SL x1 at urgent care without change in pain, rating pain 8/10 at this time, IV started at urgent care, VS within normal limits for transport.

## 2012-09-26 NOTE — ED Provider Notes (Signed)
Medical screening examination/treatment/procedure(s) were performed by non-physician practitioner and as supervising physician I was immediately available for consultation/collaboration.  [place], DO 09/26/12 [phone]

## 2012-09-26 NOTE — ED Provider Notes (Signed)
Medical screening examination/treatment/procedure(s) were performed by non-physician practitioner and as supervising physician I was immediately available for consultation/collaboration.  [place], DO 09/26/12 [phone]

## 2012-11-02 ENCOUNTER — Telehealth: Payer: Self-pay | Admitting: Family Medicine

## 2012-11-02 NOTE — Telephone Encounter (Signed)
Refill request for Temazepam 30 mg & Ranitidine 300 mg and send to [facility].

## 2012-11-03 NOTE — Telephone Encounter (Signed)
Call in #30 of each locally. Needs an OV before we can do any 90 days rx

## 2012-11-04 MED ORDER — TEMAZEPAM 30 MG PO CAPS: 30.0000 mg | ORAL_CAPSULE | Freq: Every evening | ORAL | Status: DC | PRN

## 2012-11-04 MED ORDER — RANITIDINE HCL 300 MG PO CAPS: 300.0000 mg | ORAL_CAPSULE | Freq: Every day | ORAL | Status: DC

## 2012-11-04 NOTE — Telephone Encounter (Signed)
I spoke with pt and sent both scripts to [facility].

## 2012-11-13 ENCOUNTER — Other Ambulatory Visit: Payer: Self-pay | Admitting: Family Medicine

## 2012-11-13 NOTE — Telephone Encounter (Signed)
Can we refill this? 

## 2012-11-16 NOTE — Telephone Encounter (Signed)
Call in #30 only. She needs an OV and fasting labs

## 2012-11-17 NOTE — Telephone Encounter (Signed)
I spoke with pt and she is going to come in for a office visit in 11/2012. She has enough of this medication to last until then.

## 2012-12-03 ENCOUNTER — Other Ambulatory Visit: Payer: Self-pay

## 2013-04-28 ENCOUNTER — Other Ambulatory Visit: Payer: Self-pay | Admitting: Family Medicine

## 2013-04-28 ENCOUNTER — Ambulatory Visit: Payer: BC Managed Care – PPO | Admitting: Family Medicine

## 2013-05-12 ENCOUNTER — Ambulatory Visit (INDEPENDENT_AMBULATORY_CARE_PROVIDER_SITE_OTHER): Payer: BC Managed Care – PPO | Admitting: Family Medicine

## 2013-05-12 ENCOUNTER — Encounter: Payer: Self-pay | Admitting: Family Medicine

## 2013-05-12 ENCOUNTER — Telehealth: Payer: Self-pay | Admitting: Family Medicine

## 2013-05-12 VITALS — BP 128/80 | HR 72 | Temp 98.0°F | Ht 67.0 in | Wt 257.0 lb

## 2013-05-12 DIAGNOSIS — I10 Essential (primary) hypertension: Secondary | ICD-10-CM

## 2013-05-12 DIAGNOSIS — E785 Hyperlipidemia, unspecified: Secondary | ICD-10-CM

## 2013-05-12 DIAGNOSIS — E039 Hypothyroidism, unspecified: Secondary | ICD-10-CM

## 2013-05-12 DIAGNOSIS — K219 Gastro-esophageal reflux disease without esophagitis: Secondary | ICD-10-CM

## 2013-05-12 DIAGNOSIS — I2510 Atherosclerotic heart disease of native coronary artery without angina pectoris: Secondary | ICD-10-CM

## 2013-05-12 LAB — CBC WITH DIFFERENTIAL/PLATELET
BASOS ABS: 0 10*3/uL (ref 0.0–0.1)
Basophils Relative: 0.6 % (ref 0.0–3.0)
EOS ABS: 0.2 10*3/uL (ref 0.0–0.7)
Eosinophils Relative: 3.1 % (ref 0.0–5.0)
HCT: 43.5 % (ref 36.0–46.0)
HEMOGLOBIN: 14.7 g/dL (ref 12.0–15.0)
Lymphocytes Relative: 18.6 % (ref 12.0–46.0)
Lymphs Abs: 1.4 10*3/uL (ref 0.7–4.0)
MCHC: 33.8 g/dL (ref 30.0–36.0)
MCV: 87.4 fl (ref 78.0–100.0)
MONOS PCT: 8.5 % (ref 3.0–12.0)
Monocytes Absolute: 0.6 10*3/uL (ref 0.1–1.0)
NEUTROS PCT: 69.2 % (ref 43.0–77.0)
Neutro Abs: 5.2 10*3/uL (ref 1.4–7.7)
Platelets: 210 10*3/uL (ref 150.0–400.0)
RBC: 4.98 Mil/uL (ref 3.87–5.11)
RDW: 14.1 % (ref 11.5–14.6)
WBC: 7.5 10*3/uL (ref 4.5–10.5)

## 2013-05-12 LAB — LIPID PANEL
CHOL/HDL RATIO: 2
Cholesterol: 195 mg/dL (ref 0–200)
HDL: 82.2 mg/dL (ref >39.00)
LDL Cholesterol: 102 mg/dL — ABNORMAL HIGH (ref 0–99)
Triglycerides: 53 mg/dL (ref 0.0–149.0)
VLDL: 10.6 mg/dL (ref 0.0–40.0)

## 2013-05-12 LAB — HEPATIC FUNCTION PANEL
ALBUMIN: 4.2 g/dL (ref 3.5–5.2)
ALT: 20 U/L (ref 0–35)
AST: 16 U/L (ref 0–37)
Alkaline Phosphatase: 71 U/L (ref 39–117)
BILIRUBIN TOTAL: 0.8 mg/dL (ref 0.3–1.2)
Bilirubin, Direct: 0.1 mg/dL (ref 0.0–0.3)
Total Protein: 7.5 g/dL (ref 6.0–8.3)

## 2013-05-12 LAB — BASIC METABOLIC PANEL
BUN: 20 mg/dL (ref 6–23)
CALCIUM: 8.8 mg/dL (ref 8.4–10.5)
CO2: 28 mEq/L (ref 19–32)
CREATININE: 1 mg/dL (ref 0.4–1.2)
Chloride: 101 mEq/L (ref 96–112)
GFR: 59.73 mL/min — AB (ref >60.00)
GLUCOSE: 94 mg/dL (ref 70–99)
Potassium: 4.1 mEq/L (ref 3.5–5.1)
SODIUM: 138 meq/L (ref 135–145)

## 2013-05-12 LAB — POCT URINALYSIS DIPSTICK
Bilirubin, UA: NEGATIVE
Blood, UA: NEGATIVE
Glucose, UA: NEGATIVE
KETONES UA: NEGATIVE
Leukocytes, UA: NEGATIVE
NITRITE UA: NEGATIVE
PH UA: 5.5
Protein, UA: NEGATIVE
Spec Grav, UA: 1.025
Urobilinogen, UA: 0.2

## 2013-05-12 LAB — TSH: TSH: 0.12 u[IU]/mL — AB (ref 0.35–5.50)

## 2013-05-12 MED ORDER — LEVOTHYROXINE SODIUM 200 MCG PO TABS: 200.0000 ug | ORAL_TABLET | Freq: Every day | ORAL | Status: DC

## 2013-05-12 MED ORDER — TRAMADOL HCL 50 MG PO TABS: 100.0000 mg | ORAL_TABLET | Freq: Three times a day (TID) | ORAL | Status: DC | PRN

## 2013-05-12 MED ORDER — LOSARTAN POTASSIUM 100 MG PO TABS: 100.0000 mg | ORAL_TABLET | Freq: Every day | ORAL | Status: DC

## 2013-05-12 MED ORDER — ALBUTEROL SULFATE HFA 108 (90 BASE) MCG/ACT IN AERS: 2.0000 | INHALATION_SPRAY | RESPIRATORY_TRACT | Status: DC | PRN

## 2013-05-12 MED ORDER — OMEPRAZOLE 40 MG PO CPDR: 40.0000 mg | DELAYED_RELEASE_CAPSULE | Freq: Every day | ORAL | Status: DC

## 2013-05-12 MED ORDER — TEMAZEPAM 30 MG PO CAPS: 30.0000 mg | ORAL_CAPSULE | Freq: Every evening | ORAL | Status: DC | PRN

## 2013-05-12 MED ORDER — FLUOXETINE HCL 40 MG PO CAPS: 40.0000 mg | ORAL_CAPSULE | Freq: Every day | ORAL | Status: DC

## 2013-05-12 MED ORDER — ATORVASTATIN CALCIUM 20 MG PO TABS: 20.0000 mg | ORAL_TABLET | Freq: Every day | ORAL | Status: DC

## 2013-05-12 NOTE — Progress Notes (Signed)
   Subjective:    Patient ID: Nancy [name], female    DOB: [birth year], 57 y.o.   MRN: [id number]  HPI    Review of Systems     Objective:   Physical Exam        Assessment & Plan:

## 2013-05-12 NOTE — Telephone Encounter (Signed)
Relevant patient education assigned to patient using Emmi. ° °

## 2013-05-12 NOTE — Progress Notes (Signed)
Pre visit review using our clinic review tool, if applicable. No additional management support is needed unless otherwise documented below in the visit note. 

## 2013-05-28 ENCOUNTER — Ambulatory Visit: Payer: BC Managed Care – PPO | Admitting: Cardiology

## 2013-06-07 ENCOUNTER — Encounter: Payer: Self-pay | Admitting: Family Medicine

## 2013-06-15 ENCOUNTER — Other Ambulatory Visit: Payer: Self-pay

## 2013-06-15 DIAGNOSIS — Z1231 Encounter for screening mammogram for malignant neoplasm of breast: Secondary | ICD-10-CM

## 2013-06-23 ENCOUNTER — Ambulatory Visit
Admission: RE | Admit: 2013-06-23 | Discharge: 2013-06-23 | Disposition: A | Discharge status: Home / Self Care | Payer: BC Managed Care – PPO | Source: Ambulatory Visit

## 2013-06-23 DIAGNOSIS — Z1231 Encounter for screening mammogram for malignant neoplasm of breast: Secondary | ICD-10-CM

## 2013-11-12 ENCOUNTER — Other Ambulatory Visit: Payer: Self-pay

## 2013-11-24 ENCOUNTER — Telehealth: Payer: Self-pay | Admitting: Family Medicine

## 2013-11-24 NOTE — Telephone Encounter (Signed)
Call in #90 with one rf

## 2013-11-24 NOTE — Telephone Encounter (Signed)
[facility] - [place], [place] - [address]. Is requesting re-fill on temazepam (RESTORIL) 30 MG capsule

## 2013-11-25 MED ORDER — TEMAZEPAM 30 MG PO CAPS: 30.0000 mg | ORAL_CAPSULE | Freq: Every evening | ORAL | Status: DC | PRN

## 2013-11-25 NOTE — Telephone Encounter (Signed)
I called in script 

## 2014-01-10 ENCOUNTER — Emergency Department (HOSPITAL_BASED_OUTPATIENT_CLINIC_OR_DEPARTMENT_OTHER)
Admission: EM | Admit: 2014-01-10 | Discharge: 2014-01-10 | Disposition: A | Discharge status: Home / Self Care | Payer: BC Managed Care – PPO | Attending: Emergency Medicine | Admitting: Emergency Medicine

## 2014-01-10 ENCOUNTER — Encounter (HOSPITAL_BASED_OUTPATIENT_CLINIC_OR_DEPARTMENT_OTHER): Payer: Self-pay | Admitting: Family Medicine

## 2014-01-10 ENCOUNTER — Encounter: Payer: Self-pay | Admitting: Family Medicine

## 2014-01-10 DIAGNOSIS — Z7982 Long term (current) use of aspirin: Secondary | ICD-10-CM | POA: Insufficient documentation

## 2014-01-10 DIAGNOSIS — Z79899 Other long term (current) drug therapy: Secondary | ICD-10-CM | POA: Insufficient documentation

## 2014-01-10 DIAGNOSIS — J449 Chronic obstructive pulmonary disease, unspecified: Secondary | ICD-10-CM | POA: Insufficient documentation

## 2014-01-10 DIAGNOSIS — J029 Acute pharyngitis, unspecified: Secondary | ICD-10-CM

## 2014-01-10 DIAGNOSIS — Z862 Personal history of diseases of the blood and blood-forming organs and certain disorders involving the immune mechanism: Secondary | ICD-10-CM | POA: Insufficient documentation

## 2014-01-10 DIAGNOSIS — E785 Hyperlipidemia, unspecified: Secondary | ICD-10-CM | POA: Insufficient documentation

## 2014-01-10 DIAGNOSIS — I2510 Atherosclerotic heart disease of native coronary artery without angina pectoris: Secondary | ICD-10-CM | POA: Insufficient documentation

## 2014-01-10 DIAGNOSIS — E039 Hypothyroidism, unspecified: Secondary | ICD-10-CM | POA: Diagnosis not present

## 2014-01-10 DIAGNOSIS — I10 Essential (primary) hypertension: Secondary | ICD-10-CM | POA: Insufficient documentation

## 2014-01-10 DIAGNOSIS — Z9889 Other specified postprocedural states: Secondary | ICD-10-CM | POA: Diagnosis not present

## 2014-01-10 DIAGNOSIS — Z87891 Personal history of nicotine dependence: Secondary | ICD-10-CM | POA: Insufficient documentation

## 2014-01-10 DIAGNOSIS — H9209 Otalgia, unspecified ear: Secondary | ICD-10-CM | POA: Insufficient documentation

## 2014-01-10 DIAGNOSIS — I252 Old myocardial infarction: Secondary | ICD-10-CM | POA: Diagnosis not present

## 2014-01-10 DIAGNOSIS — R51 Headache: Secondary | ICD-10-CM | POA: Insufficient documentation

## 2014-01-10 DIAGNOSIS — Z792 Long term (current) use of antibiotics: Secondary | ICD-10-CM | POA: Insufficient documentation

## 2014-01-10 DIAGNOSIS — K219 Gastro-esophageal reflux disease without esophagitis: Secondary | ICD-10-CM | POA: Insufficient documentation

## 2014-01-10 LAB — RAPID STREP SCREEN (MED CTR MEBANE ONLY): Streptococcus, Group A Screen (Direct): NEGATIVE

## 2014-01-10 MED ORDER — CLINDAMYCIN HCL 150 MG PO CAPS: 450.0000 mg | ORAL_CAPSULE | Freq: Three times a day (TID) | ORAL | Status: DC

## 2014-01-10 MED ORDER — HYDROCODONE-ACETAMINOPHEN 5-325 MG PO TABS: 1.0000 | ORAL_TABLET | Freq: Four times a day (QID) | ORAL | Status: DC | PRN

## 2014-01-10 NOTE — Discharge Instructions (Signed)
Rapid strep test was negative. That is followed up by a formal throat culture. But the odds that this is a bacterial sore throat are getting less based on the negative tests from last week and being on Augmentin. However take the clindamycin as directed. 3 tablets 3 times a day for 7 days. Take hydrocodone as needed for the throat pain. Work note provided. Follow-up here or with your record Dr. if not improved. Definitely return for any new or worse symptoms.

## 2014-01-10 NOTE — ED Notes (Signed)
Pt c/o sore throat and left ear pain x 6 days. Pt has been exposed to strep. [facility] and has been taking augmentin. sts Sxs are worse. Low grade fever and headache.

## 2014-01-10 NOTE — ED Provider Notes (Addendum)
CSN: [id number]     Arrival date & time 01/10/14  7681 History   First MD Initiated Contact with Patient 01/10/14 0735     Chief Complaint  Patient presents with  . Sore Throat  . Otalgia     (Consider location/radiation/quality/duration/timing/severity/associated sxs/prior Treatment) The history is provided by the patient.   patient seen at [facility] clinic on Thursday for sore throat. Patient's grandchildren now were diagnosed with strep today all were started on Augmentin including her. She did have a strep test done that was negative. Patient sore throat is not improved at all may be getting a little bit worse. Associated with the left ear pain as well all symptoms been present now for 6 days. Patient also subjectively is low-grade fever and complaint of headache. No real upper respiratory congestion.  Past Medical History  Diagnosis Date  . Allergic rhinitis     gets shots per Dr. Harold Hedge   . GERD (gastroesophageal reflux disease)   . Hyperlipidemia   . Hypertension   . Hypothyroidism   . Coronary artery disease     had MI in 2001, seees Dr. Fletcher Anon at [facility]  . History of cardiac catheterization 07-24-07    showed nonconclusive disease  . Gynecological examination     sees Dr. Cherylann Banas   . Anginal pain   . Myocardial infarction 2001?  Marland Kitchen COPD (chronic obstructive pulmonary disease)     "CXR just showed mild COPD" (10/24/2011)  . Migraines     "have a history of migraines; haven't had one for years" (10/24/2011)  . Anemia 2001   Past Surgical History  Procedure Laterality Date  . Cesarean section  1984  . Thyroidectomy  1990's  . Tonsillectomy      "when I was a kid"  . Hernia repair  ~ 2007    ventral hernia repair  . Cardiac catheterization  2009   Family History  Problem Relation Age of Onset  . Cancer Other     breast,colon,prostate  . Alcohol abuse Other   . Hyperlipidemia Other   . Hypertension Other   . Stroke Other   . Heart disease Other    . Breast cancer Mother   . Pulmonary fibrosis Mother   . Coronary artery disease Father   . Hypertension Father    History  Substance Use Topics  . Smoking status: Former Smoker -- 1.00 packs/day for 30 years    Types: Cigarettes    Quit date: 10/24/2006  . Smokeless tobacco: Never Used  . Alcohol Use: No   OB History    No data available     Review of Systems  Constitutional: Positive for fever.  HENT: Positive for ear pain, sore throat and trouble swallowing. Negative for congestion.   Eyes: Negative for visual disturbance.  Respiratory: Negative for shortness of breath.   Cardiovascular: Negative for chest pain.  Gastrointestinal: Negative for abdominal pain.  Genitourinary: Negative for dysuria.  Musculoskeletal: Negative for back pain.  Skin: Negative for rash.  Neurological: Positive for headaches.  Hematological: Does not bruise/bleed easily.  Psychiatric/Behavioral: Negative for confusion.      Allergies  Sulfamethoxazole  Home Medications   Prior to Admission medications   Medication Sig Start Date End Date Taking? Authorizing Provider  amoxicillin-clavulanate (AUGMENTIN) 875-125 MG per tablet Take 1 tablet by mouth 2 (two) times daily.   Yes Historical Provider, MD  albuterol (PROVENTIL HFA;VENTOLIN HFA) 108 (90 BASE) MCG/ACT inhaler Inhale 2 puffs into the lungs every 4 (  four) hours as needed for shortness of breath. 05/12/13   Laurey Morale, MD  aspirin 325 MG tablet Take 325 mg by mouth daily.      Historical Provider, MD  atorvastatin (LIPITOR) 20 MG tablet Take 1 tablet (20 mg total) by mouth daily. 05/12/13   Laurey Morale, MD  Black Cohosh 40 MG CAPS Take 1 capsule by mouth daily.      Historical Provider, MD  calcium elemental as carbonate (CALCIUM ANTACID ULTRA) 400 MG tablet Chew 1,200 mg by mouth daily.     Historical Provider, MD  cetirizine (ZYRTEC) 10 MG tablet Take 10 mg by mouth daily.    Historical Provider, MD  clindamycin (CLEOCIN) 150 MG  capsule Take 3 capsules (450 mg total) by mouth 3 (three) times daily. 01/10/14   Fredia Sorrow, MD  FLUoxetine (PROZAC) 40 MG capsule Take 1 capsule (40 mg total) by mouth daily. 05/12/13   Laurey Morale, MD  HYDROcodone-acetaminophen (NORCO/VICODIN) 5-325 MG per tablet Take 1-2 tablets by mouth every 6 (six) hours as needed for moderate pain. 01/10/14   Fredia Sorrow, MD  levothyroxine (SYNTHROID, LEVOTHROID) 200 MCG tablet Take 1 tablet (200 mcg total) by mouth daily. 05/12/13   Laurey Morale, MD  losartan (COZAAR) 100 MG tablet Take 1 tablet (100 mg total) by mouth daily. 05/12/13   Laurey Morale, MD  Multiple Vitamin (MULTIVITAMIN) tablet Take 1 tablet by mouth daily.      Historical Provider, MD  nitroGLYCERIN (NITROSTAT) 0.4 MG SL tablet Place 1 tablet (0.4 mg total) under the tongue every 5 (five) minutes as needed for chest pain. 10/26/11   Ripudeep Krystal Eaton, MD  omeprazole (PRILOSEC) 40 MG capsule Take 1 capsule (40 mg total) by mouth daily. 05/12/13   Laurey Morale, MD  temazepam (RESTORIL) 30 MG capsule Take 1 capsule (30 mg total) by mouth at bedtime as needed for sleep. 11/25/13 06/16/14  Laurey Morale, MD  traMADol (ULTRAM) 50 MG tablet Take 2 tablets (100 mg total) by mouth every 8 (eight) hours as needed for moderate pain. 05/12/13   Laurey Morale, MD  vitamin E (VITAMIN E) 400 UNIT capsule Take 400 Units by mouth daily.      Historical Provider, MD   BP 165/84 mmHg  Pulse 76  Temp(Src) 99.6 F (37.6 C) (Oral)  Resp 16  SpO2 97% Physical Exam  Constitutional: She is oriented to person, place, and time. She appears well-developed and well-nourished. No distress.  HENT:  Head: Normocephalic and atraumatic.  Right Ear: External ear normal.  Left Ear: External ear normal.  Mouth/Throat: Oropharynx is clear and moist. No oropharyngeal exudate.  Redness to the posterior pharynx no exudate. Some inflammation. No midline shift. No evidence of peritonsillar abscess.  Eyes: Conjunctivae  and EOM are normal. Pupils are equal, round, and reactive to light.  Neck: Normal range of motion.  Cardiovascular: Normal rate, regular rhythm and normal heart sounds.   Pulmonary/Chest: Effort normal and breath sounds normal. No respiratory distress.  Abdominal: Soft. Bowel sounds are normal. There is no tenderness.  Musculoskeletal: Normal range of motion.  Neurological: She is alert and oriented to person, place, and time. No cranial nerve deficit. She exhibits normal muscle tone. Coordination normal.  Skin: Skin is warm. No rash noted.  Nursing note and vitals reviewed.   ED Course  Procedures (including critical care time) Labs Review Labs Reviewed  RAPID STREP SCREEN    Imaging Review No results found.  EKG Interpretation None      MDM   Final diagnoses:  Pharyngitis    Rapid strep here is negative. We followed up by a formal throat culture. Patient also had a negative strep test [facility]. Also patient's been on Augmentin since then. Patient can stop the Augmentin. Unlikely that this is a bacterial sore throat however will go ahead and give a trial of clindamycin. If no improvement on the clindamycin most likely a viral pharyngitis. Patient nontoxic no acute distress. Patient's bilateral ears without any evidence of ear infection.    Fredia Sorrow, MD 01/10/14 1165  Fredia Sorrow, MD 01/10/14 [phone]

## 2014-01-11 NOTE — Telephone Encounter (Signed)
I see she went to the ER instead

## 2014-01-20 ENCOUNTER — Ambulatory Visit: Payer: Self-pay | Admitting: Family Medicine

## 2014-01-20 ENCOUNTER — Encounter: Payer: Self-pay | Admitting: Family Medicine

## 2014-01-20 ENCOUNTER — Ambulatory Visit (INDEPENDENT_AMBULATORY_CARE_PROVIDER_SITE_OTHER): Payer: BC Managed Care – PPO | Admitting: Family Medicine

## 2014-01-20 VITALS — BP 131/70 | HR 77 | Temp 98.3°F | Ht 67.0 in

## 2014-01-20 DIAGNOSIS — N951 Menopausal and female climacteric states: Secondary | ICD-10-CM

## 2014-01-20 DIAGNOSIS — I209 Angina pectoris, unspecified: Secondary | ICD-10-CM

## 2014-01-20 DIAGNOSIS — I25118 Atherosclerotic heart disease of native coronary artery with other forms of angina pectoris: Secondary | ICD-10-CM

## 2014-01-20 DIAGNOSIS — I10 Essential (primary) hypertension: Secondary | ICD-10-CM

## 2014-01-20 MED ORDER — NITROGLYCERIN 0.4 MG SL SUBL: 0.4000 mg | SUBLINGUAL_TABLET | SUBLINGUAL | Status: DC | PRN

## 2014-01-20 MED ORDER — FLUOXETINE HCL 20 MG PO TABS: 20.0000 mg | ORAL_TABLET | Freq: Every day | ORAL | Status: DC

## 2014-01-20 NOTE — Progress Notes (Signed)
Pre visit review using our clinic review tool, if applicable. No additional management support is needed unless otherwise documented below in the visit note. 

## 2014-01-20 NOTE — Progress Notes (Signed)
   Subjective:    Patient ID: Nancy [name], female    DOB: 1955/05/19, 58 y.o.   MRN: [id number]  HPI Here to discuss several issues. First she has had to miss a good deal of work this year due to either feeling weak or SOB or to having chest pains. She uses [employer] with good results. She asks if she would qualify for intermittent FMLA. Also she has been taking Prozac 40 mg daily for the past 7 years after her GYN started her on this for menopausal symptoms. She feels she no longer needs this and asks if she can stop it. Her BP has been stable.    Review of Systems  Constitutional: Negative.   Respiratory: Positive for shortness of breath. Negative for cough, choking and wheezing.   Cardiovascular: Positive for chest pain. Negative for palpitations and leg swelling.  Neurological: Negative.   Psychiatric/Behavioral: Negative.        Objective:   Physical Exam  Constitutional: She is oriented to person, place, and time. She appears well-developed and well-nourished. No distress.  Cardiovascular: Normal rate, regular rhythm, normal heart sounds and intact distal pulses.   Pulmonary/Chest: Effort normal and breath sounds normal.  Neurological: She is alert and oriented to person, place, and time.  Psychiatric: She has a normal mood and affect. Her behavior is normal. Judgment and thought content normal.          Assessment & Plan:  I think she definitely qualifies for intermittent FMLA so she will talk to her HR department to get [place] some forms to fill out. Refilled her NTG. Advised her to taper off Prozac slowly. She will take 20 mg a day for 2 weeks, then every other day for 2 weeks, and then stop it.

## 2014-02-01 ENCOUNTER — Telehealth: Payer: Self-pay | Admitting: Family Medicine

## 2014-02-01 NOTE — Telephone Encounter (Signed)
Pt called to follow up on FMLA paper work. She would like to know if it was received.

## 2014-02-02 NOTE — Telephone Encounter (Signed)
I spoke with pt and she has a copy and will bring by the office tomorrow.

## 2014-02-05 ENCOUNTER — Encounter: Payer: Self-pay | Admitting: Family Medicine

## 2014-03-17 ENCOUNTER — Other Ambulatory Visit: Payer: Self-pay | Admitting: Family Medicine

## 2014-03-25 ENCOUNTER — Other Ambulatory Visit: Payer: Self-pay

## 2014-03-25 NOTE — Telephone Encounter (Signed)
Rx request for Temazepam 30 mg capsule #90  Pharm:  [facility]  Pls advise.

## 2014-03-27 ENCOUNTER — Other Ambulatory Visit: Payer: Self-pay | Admitting: Family Medicine

## 2014-03-28 NOTE — Telephone Encounter (Signed)
Call in #90 with one rf

## 2014-03-29 MED ORDER — TEMAZEPAM 30 MG PO CAPS: 30.0000 mg | ORAL_CAPSULE | Freq: Every evening | ORAL | Status: DC | PRN

## 2014-05-04 ENCOUNTER — Other Ambulatory Visit: Payer: Self-pay | Admitting: Family Medicine

## 2014-05-30 ENCOUNTER — Encounter: Payer: Self-pay | Admitting: Internal Medicine

## 2014-06-09 ENCOUNTER — Other Ambulatory Visit: Payer: Self-pay

## 2014-06-09 DIAGNOSIS — Z1231 Encounter for screening mammogram for malignant neoplasm of breast: Secondary | ICD-10-CM

## 2014-06-15 ENCOUNTER — Other Ambulatory Visit: Payer: Self-pay | Admitting: Family Medicine

## 2014-06-23 ENCOUNTER — Emergency Department (INDEPENDENT_AMBULATORY_CARE_PROVIDER_SITE_OTHER)
Admission: EM | Admit: 2014-06-23 | Discharge: 2014-06-23 | Disposition: A | Discharge status: Home / Self Care | Payer: 59 | Source: Home / Self Care | Attending: Emergency Medicine | Admitting: Emergency Medicine

## 2014-06-23 ENCOUNTER — Encounter: Payer: Self-pay | Admitting: *Deleted

## 2014-06-23 ENCOUNTER — Emergency Department (INDEPENDENT_AMBULATORY_CARE_PROVIDER_SITE_OTHER): Payer: 59

## 2014-06-23 DIAGNOSIS — J209 Acute bronchitis, unspecified: Secondary | ICD-10-CM

## 2014-06-23 DIAGNOSIS — R062 Wheezing: Secondary | ICD-10-CM | POA: Diagnosis not present

## 2014-06-23 DIAGNOSIS — R05 Cough: Secondary | ICD-10-CM

## 2014-06-23 DIAGNOSIS — J0100 Acute maxillary sinusitis, unspecified: Secondary | ICD-10-CM

## 2014-06-23 DIAGNOSIS — R058 Other specified cough: Secondary | ICD-10-CM

## 2014-06-23 MED ORDER — CEFTRIAXONE SODIUM 1 G IJ SOLR
1.0000 g | INTRAMUSCULAR | Status: AC
  Administered 2014-06-23: 1 g via INTRAMUSCULAR

## 2014-06-23 MED ORDER — PROMETHAZINE-CODEINE 6.25-10 MG/5ML PO SYRP: ORAL_SOLUTION | ORAL | Status: DC

## 2014-06-23 MED ORDER — IPRATROPIUM-ALBUTEROL 0.5-2.5 (3) MG/3ML IN SOLN
3.0000 mL | RESPIRATORY_TRACT | Status: AC
  Administered 2014-06-23: 3 mL via RESPIRATORY_TRACT

## 2014-06-23 MED ORDER — METHYLPREDNISOLONE SODIUM SUCC 125 MG IJ SOLR
125.0000 mg | INTRAMUSCULAR | Status: AC
  Administered 2014-06-23: 125 mg via INTRAMUSCULAR

## 2014-06-23 MED ORDER — CLARITHROMYCIN 500 MG PO TABS: ORAL_TABLET | ORAL | Status: DC

## 2014-06-23 MED ORDER — PREDNISONE 50 MG PO TABS: 50.0000 mg | ORAL_TABLET | Freq: Every day | ORAL | Status: DC

## 2014-06-23 NOTE — ED Notes (Signed)
Pt c/o 1 week of productive cough, congestion and ear pain. Seen @ [facility] 4 days ago. Given Augmentin and tessalon and is not improving.

## 2014-06-23 NOTE — ED Provider Notes (Signed)
CSN: [id number]     Arrival date & time 06/23/14  1221 History   First MD Initiated Contact with Patient 06/23/14 1224     Chief Complaint  Patient presents with  . Cough  . Otalgia   Patient is a respiratory therapist, works at [employer] HPI 1 week of progressive URI symptoms. Sinus congestion discolored rhinorrhea and productive cough. [facility], prescribed Augmentin and Tessalon, but symptoms did not improve and are actually worse now.  Positive chills/sweats +  Fever  +  Nasal congestion +  Discolored Post-nasal drainage Mild sinus pain/pressure No sore throat  +  Productive hacking cough. Yellow green sputum Positive wheezing Positive chest congestion No hemoptysis No shortness of breath No pleuritic pain  No itchy/red eyes No earache  No nausea No vomiting No abdominal pain No diarrhea  No skin rashes +  Fatigue Mild myalgias No headache   She denies history of chronic lung disease. She quit smoking 8 years ago. After further history taking, she states she tried albuterol HFA at home, which had been prescribed for allergic wheezing in the past. No significant improvement on the home albuterol HFA.  Past Medical History  Diagnosis Date  . Allergic rhinitis     gets shots per Dr. Harold Hedge   . GERD (gastroesophageal reflux disease)   . Hyperlipidemia   . Hypertension   . Hypothyroidism   . Coronary artery disease     had MI in 2001, seees Dr. Fletcher Anon at [facility]  . History of cardiac catheterization 07-24-07    showed nonconclusive disease  . Gynecological examination     sees Dr. Cherylann Banas   . Anginal pain   . Myocardial infarction 2001?  Marland Kitchen COPD (chronic obstructive pulmonary disease)     "CXR just showed mild COPD" (10/24/2011)  . Migraines     "have a history of migraines; haven't had one for years" (10/24/2011)  . Anemia 2001   Past Surgical History  Procedure Laterality Date  . Cesarean section  1984  .  Thyroidectomy  1990's  . Tonsillectomy      "when I was a kid"  . Hernia repair  ~ 2007    ventral hernia repair  . Cardiac catheterization  2009  . Colonoscopy  10-06-08    per Dr. Henrene Pastor, benign polyps, repeat in 10 yrs    Family History  Problem Relation Age of Onset  . Cancer Other     breast,colon,prostate  . Alcohol abuse Other   . Hyperlipidemia Other   . Hypertension Other   . Stroke Other   . Heart disease Other   . Breast cancer Mother   . Pulmonary fibrosis Mother   . Coronary artery disease Father   . Hypertension Father    History  Substance Use Topics  . Smoking status: Former Smoker -- 1.00 packs/day for 30 years    Types: Cigarettes    Quit date: 10/24/2006  . Smokeless tobacco: Never Used  . Alcohol Use: No   OB History    No data available     Review of Systems Remainder of Review of Systems negative for acute change except as noted in the HPI.  Allergies  Sulfamethoxazole  Home Medications   Prior to Admission medications   Medication Sig Start Date End Date Taking? Authorizing Provider  aspirin 325 MG tablet Take 325 mg by mouth daily.      Historical Provider, MD  atorvastatin (LIPITOR) 20 MG  tablet TAKE 1 TABLET DAILY 03/28/14   Laurey Morale, MD  Black Cohosh 40 MG CAPS Take 1 capsule by mouth daily.      Historical Provider, MD  calcium elemental as carbonate (CALCIUM ANTACID ULTRA) 400 MG tablet Chew 1,200 mg by mouth daily.     Historical Provider, MD  cetirizine (ZYRTEC) 10 MG tablet Take 10 mg by mouth daily.    Historical Provider, MD  clarithromycin (BIAXIN) 500 MG tablet Take 1 twice a day for 10 days. 06/23/14   Jacqulyn Cane, MD  FLUoxetine (PROZAC) 20 MG tablet Take 1 tablet (20 mg total) by mouth daily. 01/20/14   Laurey Morale, MD  levothyroxine (SYNTHROID, LEVOTHROID) 200 MCG tablet TAKE 1 TABLET DAILY 03/28/14   Laurey Morale, MD  losartan (COZAAR) 100 MG tablet TAKE 1 TABLET DAILY 03/28/14   Laurey Morale, MD  Multiple Vitamin  (MULTIVITAMIN) tablet Take 1 tablet by mouth daily.      Historical Provider, MD  nitroGLYCERIN (NITROSTAT) 0.4 MG SL tablet Place 1 tablet (0.4 mg total) under the tongue every 5 (five) minutes as needed for chest pain. 01/20/14   Laurey Morale, MD  omeprazole (PRILOSEC) 40 MG capsule TAKE 1 CAPSULE DAILY 06/16/14   Laurey Morale, MD  predniSONE (DELTASONE) 50 MG tablet Take 1 tablet (50 mg total) by mouth daily. With food for 5 days. 06/23/14   Jacqulyn Cane, MD  promethazine-codeine [facility] WITH CODEINE) 6.25-10 MG/5ML syrup Take 1-2 teaspoons at bedtime as needed for severe cough.  May cause drowsiness. 06/23/14   Jacqulyn Cane, MD  temazepam (RESTORIL) 30 MG capsule Take 1 capsule (30 mg total) by mouth at bedtime as needed for sleep. 03/29/14 10/14/14  Laurey Morale, MD  traMADol (ULTRAM) 50 MG tablet Take 2 tablets (100 mg total) by mouth every 8 (eight) hours as needed for moderate pain. 05/12/13   Laurey Morale, MD  VENTOLIN HFA 108 (90 BASE) MCG/ACT inhaler INHALE 2 PUFFS EVERY 4 HOURS AS NEEDED 05/06/14   Laurey Morale, MD  vitamin E (VITAMIN E) 400 UNIT capsule Take 400 Units by mouth daily.      Historical Provider, MD   BP 157/85 mmHg  Pulse 81  Temp(Src) 98.7 F (37.1 C) (Oral)  Resp 16  Ht 5' 7"  (1.702 m)  Wt 309 lb (140.161 kg)  BMI 48.38 kg/m2  SpO2 95% Physical Exam  Constitutional: She is oriented to person, place, and time. She appears well-developed and well-nourished. No distress.  HENT:  Head: Normocephalic and atraumatic.  Right Ear: Tympanic membrane, external ear and ear canal normal.  Left Ear: Tympanic membrane, external ear and ear canal normal.  Nose: Mucosal edema and rhinorrhea present. Right sinus exhibits maxillary sinus tenderness. Left sinus exhibits maxillary sinus tenderness.  Mouth/Throat: Oropharynx is clear and moist. No oral lesions. No oropharyngeal exudate.  Eyes: Right eye exhibits no discharge. Left eye exhibits no discharge. No scleral icterus.   Neck: Neck supple.  Cardiovascular: Normal rate, regular rhythm and normal heart sounds.   Pulmonary/Chest: Effort normal. She has wheezes (Harsh wheezes throughout. Fair air movement.). She has rhonchi. She has no rales.  Moist rhonchi and wheezes throughout bilaterally.  Lymphadenopathy:    She has no cervical adenopathy.  Neurological: She is alert and oriented to person, place, and time.  Skin: Skin is warm and dry.  Psychiatric: She has a normal mood and affect.  Nursing note and vitals reviewed.  O2 saturation on room  air 95%-she states her usual O2 saturation is about 98% Urgent care course 1:05 PM DuoNeb ordered  Chest x-ray ordered   Procedures (including critical care time) Labs Review Labs Reviewed - No data to display  Imaging Review Dg Chest 2 View  06/23/2014   CLINICAL DATA:  One week history of cough and congestion ; shortness of breath  EXAM: CHEST  2 VIEW  COMPARISON:  Chest radiograph September 24, 2012; chest CT September 24, 2012  FINDINGS: There is no edema or consolidation. The heart size and pulmonary vascularity are normal. No adenopathy. No bone lesions. There is mild degenerative change in the thoracic spine. There are surgical clips in the lower neck region.  IMPRESSION: No edema or consolidation.   Electronically Signed   By: Lowella Grip III M.D.   On: 06/23/2014 13:36   MDM   1. Acute bronchitis with bronchospasm   2. Wheezing   3. Productive cough   4. Acute maxillary sinusitis, recurrence not specified    Patient reexamined and reevaluated. Breathing improved somewhat after DuoNeb treatment. She still had harsh rhonchi throughout and mild late expiratory wheezes. Good air expansion bilaterally. Reviewed with her that chest x-ray showed no acute abnormalities. No consolidation or infiltrate.  Treatment options discussed, as well as risks, benefits, alternatives. Patient voiced understanding and agreement with the following plans: Rocephin 1 g IM  stat Solu-Medrol 125 mg IM stat New Prescriptions   CLARITHROMYCIN (BIAXIN) 500 MG TABLET    Take 1 twice a day for 10 days.   PREDNISONE (DELTASONE) 50 MG TABLET    Take 1 tablet (50 mg total) by mouth daily. With food for 5 days.   PROMETHAZINE-CODEINE (PHENERGAN WITH CODEINE) 6.25-10 MG/5ML SYRUP    Take 1-2 teaspoons at bedtime as needed for severe cough.  May cause drowsiness.   I'm choosing clarithromycin as oral antibiotic because she worsened on Augmentin the past 4 days, and because she is allergic to sulfa. Clarithromycin has somewhat better bacterial coverage for sinusitis and bronchitis, compared to azithromycin. She has taken clarithromycin in the past without any problems or side effects. May also continue albuterol HFA which he has at home, prn  Follow-up with your primary care doctor in 3-5 days. Precautions discussed. Red flags discussed.--Emergency room if any red flag An After Visit Summary was printed and given to the patient. Questions invited and answered. Patient voiced understanding and agreement. Over 45 minutes spent, greater than 50% of the time spent for counseling and coordination of care.    Jacqulyn Cane, MD 06/23/14 1416

## 2014-06-23 NOTE — Discharge Instructions (Signed)
Acute Bronchitis Bronchitis is inflammation of the airways that extend from the windpipe into the lungs (bronchi). The inflammation often causes mucus to develop. This leads to a cough, which is the most common symptom of bronchitis.  In acute bronchitis, the condition usually develops suddenly and goes away over time, usually in a couple weeks. Smoking, allergies, and asthma can make bronchitis worse. Repeated episodes of bronchitis may cause further lung problems.  CAUSES Acute bronchitis is most often caused by the same virus that causes a cold. The virus can spread from person to person (contagious) through coughing, sneezing, and touching contaminated objects. SIGNS AND SYMPTOMS   Cough.   Fever.   Coughing up mucus.   Body aches.   Chest congestion.   Chills.   Wheezing  Shortness of breath.   Sore throat.  DIAGNOSIS  Acute bronchitis is usually diagnosed through a physical exam. Your health care provider will also ask you questions about your medical history. Tests, such as chest X-rays, are sometimes done to rule out other conditions.  TREATMENT  Acute bronchitis usually goes away in a couple weeks. Oftentimes, no medical treatment is necessary. Medicines are sometimes given for relief of fever or cough. Antibiotic medicines are usually not needed but may be prescribed in certain situations. In some cases, an inhaler may be recommended to help reduce shortness of breath and control the cough. A cool mist vaporizer may also be used to help thin bronchial secretions and make it easier to clear the chest.  HOME CARE INSTRUCTIONS  Get plenty of rest.   Drink enough fluids to keep your urine clear or pale yellow (unless you have a medical condition that requires fluid restriction). Increasing fluids may help thin your respiratory secretions (sputum) and reduce chest congestion, and it will prevent dehydration.   Take medicines only as directed by your health care  provider.  If you were prescribed an antibiotic medicine, finish it all even if you start to feel better.  Avoid smoking and secondhand smoke. Exposure to cigarette smoke or irritating chemicals will make bronchitis worse. If you are a smoker, consider using nicotine gum or skin patches to help control withdrawal symptoms. Quitting smoking will help your lungs heal faster.   Reduce the chances of another bout of acute bronchitis by washing your hands frequently, avoiding people with cold symptoms, and trying not to touch your hands to your mouth, nose, or eyes.   Keep all follow-up visits as directed by your health care provider.  SEEK MEDICAL CARE IF: Your symptoms do not improve after 1 week of treatment.  SEEK IMMEDIATE MEDICAL CARE IF:  You develop an increased fever or chills.   You have chest pain.   You have severe shortness of breath.  You have bloody sputum.   You develop dehydration.  You faint or repeatedly feel like you are going to pass out.  You develop repeated vomiting.  You develop a severe headache. MAKE SURE YOU:   Understand these instructions.  Will watch your condition.  Will get help right away if you are not doing well or get worse. Document Released: 02/22/2004 Document Revised: 05/31/2013 Document Reviewed: 07/07/2012 [employer] Patient Information 2015 [place], [place]. This information is not intended to replace advice given to you by your health care provider. Make sure you discuss any questions you have with your health care provider.

## 2014-06-24 ENCOUNTER — Ambulatory Visit: Payer: Self-pay | Admitting: Family Medicine

## 2014-07-01 ENCOUNTER — Ambulatory Visit
Admission: RE | Admit: 2014-07-01 | Discharge: 2014-07-01 | Disposition: A | Discharge status: Home / Self Care | Payer: 59 | Source: Ambulatory Visit

## 2014-07-01 DIAGNOSIS — Z1231 Encounter for screening mammogram for malignant neoplasm of breast: Secondary | ICD-10-CM

## 2014-07-28 ENCOUNTER — Other Ambulatory Visit: Payer: Self-pay | Admitting: Family Medicine

## 2014-07-28 MED ORDER — ATORVASTATIN CALCIUM 20 MG PO TABS: 20.0000 mg | ORAL_TABLET | Freq: Every day | ORAL | Status: DC

## 2014-07-28 MED ORDER — LEVOTHYROXINE SODIUM 200 MCG PO TABS: 200.0000 ug | ORAL_TABLET | Freq: Every day | ORAL | Status: DC

## 2014-07-28 MED ORDER — TRAMADOL HCL 50 MG PO TABS: 100.0000 mg | ORAL_TABLET | Freq: Three times a day (TID) | ORAL | Status: DC | PRN

## 2014-07-28 MED ORDER — TEMAZEPAM 30 MG PO CAPS: 30.0000 mg | ORAL_CAPSULE | Freq: Every evening | ORAL | Status: DC | PRN

## 2014-07-28 MED ORDER — LOSARTAN POTASSIUM 100 MG PO TABS: 100.0000 mg | ORAL_TABLET | Freq: Every day | ORAL | Status: DC

## 2014-07-28 MED ORDER — OMEPRAZOLE 40 MG PO CPDR: 40.0000 mg | DELAYED_RELEASE_CAPSULE | Freq: Every day | ORAL | Status: DC

## 2014-07-28 NOTE — Telephone Encounter (Signed)
Refills sent and printed.

## 2014-07-28 NOTE — Telephone Encounter (Signed)
Pt request refill of the following:   levothyroxine (SYNTHROID, LEVOTHROID) 200 MCG tablet , temazepam (RESTORIL) 30 MG capsule ,losartan (COZAAR) 100 MG tablet, omeprazole (PRILOSEC) 40 MG capsule ,  atorvastatin (LIPITOR) 20 MG tablet , traMADol (ULTRAM) 50 MG tablet    Phamacy:  [facility]

## 2014-07-28 NOTE — Telephone Encounter (Signed)
Refill Temazepam and Tramadol for 6 months, refill the others for one year

## 2014-07-29 MED ORDER — LEVOTHYROXINE SODIUM 200 MCG PO TABS: 200.0000 ug | ORAL_TABLET | Freq: Every day | ORAL | Status: DC

## 2014-07-29 MED ORDER — OMEPRAZOLE 40 MG PO CPDR: 40.0000 mg | DELAYED_RELEASE_CAPSULE | Freq: Every day | ORAL | Status: DC

## 2014-07-29 MED ORDER — ATORVASTATIN CALCIUM 20 MG PO TABS: 20.0000 mg | ORAL_TABLET | Freq: Every day | ORAL | Status: DC

## 2014-07-29 MED ORDER — LOSARTAN POTASSIUM 100 MG PO TABS: 100.0000 mg | ORAL_TABLET | Freq: Every day | ORAL | Status: DC

## 2014-07-29 NOTE — Telephone Encounter (Signed)
Scripts need to be sent to [facility].

## 2014-07-29 NOTE — Telephone Encounter (Signed)
I resent all 4 [facility] ( [facility] ) and I faxed scripts for Restoril & Ultram to [facility].

## 2014-08-15 ENCOUNTER — Encounter: Payer: Self-pay | Admitting: Family Medicine

## 2014-08-16 NOTE — Telephone Encounter (Signed)
We are waiting on the fax to arrive

## 2014-08-19 DIAGNOSIS — Z0279 Encounter for issue of other medical certificate: Secondary | ICD-10-CM

## 2014-08-23 ENCOUNTER — Encounter: Payer: Self-pay | Admitting: Family Medicine

## 2014-08-23 NOTE — Telephone Encounter (Signed)
Paperwork was sent up front, can you check into this for pt?

## 2014-12-30 ENCOUNTER — Ambulatory Visit (INDEPENDENT_AMBULATORY_CARE_PROVIDER_SITE_OTHER): Payer: 59 | Admitting: Family Medicine

## 2014-12-30 ENCOUNTER — Encounter: Payer: Self-pay | Admitting: Family Medicine

## 2014-12-30 VITALS — BP 133/60 | HR 85 | Temp 98.2°F

## 2014-12-30 DIAGNOSIS — J209 Acute bronchitis, unspecified: Secondary | ICD-10-CM

## 2014-12-30 MED ORDER — CLARITHROMYCIN 500 MG PO TABS: 500.0000 mg | ORAL_TABLET | Freq: Two times a day (BID) | ORAL | Status: DC

## 2014-12-30 MED ORDER — HYDROCODONE-HOMATROPINE 5-1.5 MG/5ML PO SYRP: 5.0000 mL | ORAL_SOLUTION | ORAL | Status: DC | PRN

## 2014-12-30 MED ORDER — METHYLPREDNISOLONE 4 MG PO TBPK: ORAL_TABLET | ORAL | Status: DC

## 2014-12-30 NOTE — Progress Notes (Signed)
   Subjective:    Patient ID: Nancy [name], female    DOB: 08-Jun-1955, 59 y.o.   MRN: [id number]  HPI Here for 5 weeks of chest tightness, wheezing, and coughing up yellow sputum. No fever. She saw a [facility] last month and was given Augmentin, but this did not help.    Review of Systems  Constitutional: Negative.   HENT: Positive for congestion and postnasal drip. Negative for sinus pressure and sore throat.   Eyes: Negative.   Respiratory: Positive for cough, shortness of breath and wheezing.   Cardiovascular: Negative.        Objective:   Physical Exam  Constitutional: She appears well-developed and well-nourished.  HENT:  Right Ear: External ear normal.  Left Ear: External ear normal.  Nose: Nose normal.  Mouth/Throat: Oropharynx is clear and moist.  Eyes: Conjunctivae are normal.  Neck: No thyromegaly present.  Cardiovascular: Normal rate, regular rhythm, normal heart sounds and intact distal pulses.   Pulmonary/Chest: Effort normal. No respiratory distress. She has no rales.  Loud diffuse rhonchi and wheezing  Lymphadenopathy:    She has no cervical adenopathy.          Assessment & Plan:  Bronchitis, treat with Biaxin.

## 2015-02-02 ENCOUNTER — Encounter: Payer: Self-pay | Admitting: Family Medicine

## 2015-02-06 DIAGNOSIS — Z7689 Persons encountering health services in other specified circumstances: Secondary | ICD-10-CM

## 2015-02-23 ENCOUNTER — Encounter: Payer: Self-pay | Admitting: Family Medicine

## 2015-02-23 NOTE — Telephone Encounter (Signed)
It would be fine for her to stop all aspirin for 2 weeks prior to the procedure.

## 2015-03-10 ENCOUNTER — Telehealth: Payer: Self-pay | Admitting: Family Medicine

## 2015-03-10 MED ORDER — TEMAZEPAM 30 MG PO CAPS: 30.0000 mg | ORAL_CAPSULE | Freq: Every evening | ORAL | Status: DC | PRN

## 2015-03-10 NOTE — Telephone Encounter (Signed)
done

## 2015-03-10 NOTE — Telephone Encounter (Signed)
[facility] [phone]  Requesting refill of temazepam (RESTORIL) 30 MG capsule

## 2015-03-13 NOTE — Telephone Encounter (Signed)
Script was faxed to mail order.

## 2015-04-04 ENCOUNTER — Telehealth: Payer: Self-pay | Admitting: Family Medicine

## 2015-04-04 NOTE — Telephone Encounter (Signed)
Make an OV for surgical clearance

## 2015-04-04 NOTE — Telephone Encounter (Signed)
Pt call to say that she is having surgery and the doctor is requesting a cardiac clearance and is asking for a letter to be sent   Dr Harlow Mares

## 2015-04-06 ENCOUNTER — Encounter: Payer: Self-pay | Admitting: Family Medicine

## 2015-04-06 NOTE — Telephone Encounter (Signed)
Letter is ready to fax

## 2015-04-10 NOTE — Telephone Encounter (Signed)
lmovm to call and schedule an appt  °

## 2015-04-18 ENCOUNTER — Other Ambulatory Visit: Payer: Self-pay | Admitting: Plastic Surgery

## 2015-07-06 DIAGNOSIS — Z7689 Persons encountering health services in other specified circumstances: Secondary | ICD-10-CM

## 2015-08-22 ENCOUNTER — Other Ambulatory Visit: Payer: Self-pay | Admitting: Family Medicine

## 2015-08-22 DIAGNOSIS — Z1231 Encounter for screening mammogram for malignant neoplasm of breast: Secondary | ICD-10-CM

## 2015-08-27 ENCOUNTER — Other Ambulatory Visit: Payer: Self-pay | Admitting: Family Medicine

## 2015-09-01 ENCOUNTER — Other Ambulatory Visit: Payer: Self-pay | Admitting: Family Medicine

## 2015-09-04 ENCOUNTER — Ambulatory Visit
Admission: RE | Admit: 2015-09-04 | Discharge: 2015-09-04 | Disposition: A | Discharge status: Home / Self Care | Payer: 59 | Source: Ambulatory Visit | Attending: Family Medicine | Admitting: Family Medicine

## 2015-09-04 DIAGNOSIS — Z1231 Encounter for screening mammogram for malignant neoplasm of breast: Secondary | ICD-10-CM

## 2015-09-05 ENCOUNTER — Other Ambulatory Visit: Payer: Self-pay | Admitting: Family Medicine

## 2015-09-05 DIAGNOSIS — R928 Other abnormal and inconclusive findings on diagnostic imaging of breast: Secondary | ICD-10-CM

## 2015-09-08 ENCOUNTER — Ambulatory Visit (INDEPENDENT_AMBULATORY_CARE_PROVIDER_SITE_OTHER): Payer: 59 | Admitting: Family Medicine

## 2015-09-08 ENCOUNTER — Encounter: Payer: Self-pay | Admitting: Family Medicine

## 2015-09-08 VITALS — BP 124/80 | HR 81 | Temp 98.4°F | Ht 67.0 in | Wt 287.0 lb

## 2015-09-08 DIAGNOSIS — I25118 Atherosclerotic heart disease of native coronary artery with other forms of angina pectoris: Secondary | ICD-10-CM

## 2015-09-08 DIAGNOSIS — I10 Essential (primary) hypertension: Secondary | ICD-10-CM | POA: Diagnosis not present

## 2015-09-08 DIAGNOSIS — E039 Hypothyroidism, unspecified: Secondary | ICD-10-CM

## 2015-09-08 DIAGNOSIS — K219 Gastro-esophageal reflux disease without esophagitis: Secondary | ICD-10-CM

## 2015-09-08 DIAGNOSIS — D649 Anemia, unspecified: Secondary | ICD-10-CM | POA: Diagnosis not present

## 2015-09-08 LAB — BASIC METABOLIC PANEL
BUN: 18 mg/dL (ref 6–23)
CALCIUM: 9.3 mg/dL (ref 8.4–10.5)
CO2: 30 mEq/L (ref 19–32)
CREATININE: 1.19 mg/dL (ref 0.40–1.20)
Chloride: 101 mEq/L (ref 96–112)
GFR: 49.58 mL/min — AB (ref >60.00)
GLUCOSE: 105 mg/dL — AB (ref 70–99)
Potassium: 3.7 mEq/L (ref 3.5–5.1)
Sodium: 140 mEq/L (ref 135–145)

## 2015-09-08 LAB — CBC WITH DIFFERENTIAL/PLATELET
BASOS PCT: 0.7 % (ref 0.0–3.0)
Basophils Absolute: 0.1 10*3/uL (ref 0.0–0.1)
EOS PCT: 4.4 % (ref 0.0–5.0)
Eosinophils Absolute: 0.3 10*3/uL (ref 0.0–0.7)
HEMATOCRIT: 43.4 % (ref 36.0–46.0)
HEMOGLOBIN: 14.9 g/dL (ref 12.0–15.0)
Lymphocytes Relative: 26.2 % (ref 12.0–46.0)
Lymphs Abs: 2.1 10*3/uL (ref 0.7–4.0)
MCHC: 34.3 g/dL (ref 30.0–36.0)
MCV: 84.8 fl (ref 78.0–100.0)
MONOS PCT: 8.2 % (ref 3.0–12.0)
Monocytes Absolute: 0.6 10*3/uL (ref 0.1–1.0)
Neutro Abs: 4.7 10*3/uL (ref 1.4–7.7)
Neutrophils Relative %: 60.5 % (ref 43.0–77.0)
Platelets: 214 10*3/uL (ref 150.0–400.0)
RBC: 5.11 Mil/uL (ref 3.87–5.11)
RDW: 13.6 % (ref 11.5–15.5)
WBC: 7.9 10*3/uL (ref 4.0–10.5)

## 2015-09-08 LAB — TSH: TSH: 0.26 u[IU]/mL — AB (ref 0.35–4.50)

## 2015-09-08 LAB — LIPID PANEL
CHOL/HDL RATIO: 3
CHOLESTEROL: 177 mg/dL (ref 0–200)
HDL: 55 mg/dL (ref >39.00)
LDL CALC: 93 mg/dL (ref 0–99)
NonHDL: 121.75
TRIGLYCERIDES: 143 mg/dL (ref 0.0–149.0)
VLDL: 28.6 mg/dL (ref 0.0–40.0)

## 2015-09-08 LAB — HEPATIC FUNCTION PANEL
ALBUMIN: 4.6 g/dL (ref 3.5–5.2)
ALT: 30 U/L (ref 0–35)
AST: 21 U/L (ref 0–37)
Alkaline Phosphatase: 75 U/L (ref 39–117)
BILIRUBIN TOTAL: 0.5 mg/dL (ref 0.2–1.2)
Bilirubin, Direct: 0.1 mg/dL (ref 0.0–0.3)
Total Protein: 7.1 g/dL (ref 6.0–8.3)

## 2015-09-08 LAB — POC URINALSYSI DIPSTICK (AUTOMATED)
Blood, UA: NEGATIVE
GLUCOSE UA: NEGATIVE
Ketones, UA: NEGATIVE
Nitrite, UA: NEGATIVE
PROTEIN UA: NEGATIVE
Spec Grav, UA: 1.02
UROBILINOGEN UA: 0.2
pH, UA: 5.5

## 2015-09-08 MED ORDER — LOSARTAN POTASSIUM 100 MG PO TABS: 100.0000 mg | ORAL_TABLET | Freq: Every day | ORAL | 3 refills | Status: DC

## 2015-09-08 MED ORDER — OMEPRAZOLE 40 MG PO CPDR: 40.0000 mg | DELAYED_RELEASE_CAPSULE | Freq: Every day | ORAL | 3 refills | Status: DC

## 2015-09-08 MED ORDER — ATORVASTATIN CALCIUM 20 MG PO TABS: 20.0000 mg | ORAL_TABLET | Freq: Every day | ORAL | 3 refills | Status: DC

## 2015-09-08 MED ORDER — LEVOTHYROXINE SODIUM 200 MCG PO TABS: 200.0000 ug | ORAL_TABLET | Freq: Every day | ORAL | 3 refills | Status: DC

## 2015-09-08 MED ORDER — TEMAZEPAM 30 MG PO CAPS: 30.0000 mg | ORAL_CAPSULE | Freq: Every evening | ORAL | 1 refills | Status: DC | PRN

## 2015-09-08 MED ORDER — TRAMADOL HCL 50 MG PO TABS: 100.0000 mg | ORAL_TABLET | Freq: Three times a day (TID) | ORAL | 1 refills | Status: DC | PRN

## 2015-09-08 NOTE — Progress Notes (Signed)
Pre visit review using our clinic review tool, if applicable. No additional management support is needed unless otherwise documented below in the visit note. 

## 2015-09-08 NOTE — Progress Notes (Signed)
   Subjective:    Patient ID: Nancy [name], female    DOB: Jul 30, 1955, 60 y.o.   MRN: [id number]  HPI Here to follow up. She is doing well, no chest pain or SOB. Working full time.    Review of Systems  Constitutional: Negative.   Respiratory: Negative.   Cardiovascular: Negative.   Neurological: Negative.        Objective:   Physical Exam  Constitutional: She is oriented to person, place, and time. She appears well-developed and well-nourished.  Neck: No thyromegaly present.  Cardiovascular: Normal rate, regular rhythm, normal heart sounds and intact distal pulses.   Pulmonary/Chest: Effort normal and breath sounds normal.  Lymphadenopathy:    She has no cervical adenopathy.  Neurological: She is alert and oriented to person, place, and time.          Assessment & Plan:  Her HTN and GERD are stable. We will get fasting labs today.  Laurey Morale, MD

## 2015-09-18 ENCOUNTER — Ambulatory Visit
Admission: RE | Admit: 2015-09-18 | Discharge: 2015-09-18 | Disposition: A | Discharge status: Home / Self Care | Payer: 59 | Source: Ambulatory Visit | Attending: Family Medicine | Admitting: Family Medicine

## 2015-09-18 DIAGNOSIS — R928 Other abnormal and inconclusive findings on diagnostic imaging of breast: Secondary | ICD-10-CM

## 2015-09-19 MED FILL — TEMAZEPAM 30 MG CAPSULE: 30 | 30 days supply | Qty: 30 | Fill #0

## 2015-09-19 MED FILL — traMADol HCL 50 MG TABS: 50 | 30 days supply | Qty: 180 | Fill #0

## 2015-11-08 MED FILL — TEMAZEPAM 30 MG CAPSULE: 30 | 30 days supply | Qty: 30 | Fill #1

## 2015-12-26 MED FILL — TEMAZEPAM 30 MG CAPSULE: 30 | 30 days supply | Qty: 30 | Fill #2

## 2016-01-23 MED FILL — TEMAZEPAM 30 MG CAPSULE: 30 | 30 days supply | Qty: 30 | Fill #3

## 2016-02-22 MED FILL — TEMAZEPAM 30 MG CAPSULE: 30 | 30 days supply | Qty: 30 | Fill #4

## 2016-03-22 ENCOUNTER — Other Ambulatory Visit: Payer: Self-pay | Admitting: Emergency Medicine

## 2016-03-22 MED ORDER — TEMAZEPAM 30 MG PO CAPS: 30.0000 mg | ORAL_CAPSULE | Freq: Every evening | ORAL | 5 refills | Status: DC | PRN

## 2016-04-02 ENCOUNTER — Telehealth: Payer: Self-pay | Admitting: Family Medicine

## 2016-04-02 ENCOUNTER — Encounter: Payer: Self-pay | Admitting: Family Medicine

## 2016-04-02 NOTE — Telephone Encounter (Signed)
[facility]R will be sending over to [facility] one for her and one for taking care of  her father Nancy [name]).  She wanted to let you know if you see 2 different forms it is correct they both need to be filled out and faxed back to [facility].

## 2016-04-03 NOTE — Telephone Encounter (Signed)
noted 

## 2016-04-05 NOTE — Telephone Encounter (Signed)
We will take care of these

## 2016-04-24 ENCOUNTER — Telehealth: Payer: Self-pay | Admitting: Family Medicine

## 2016-04-24 NOTE — Telephone Encounter (Signed)
Pharmacy is calling stating that the pt is at the pharmacy stating that she should have #90 for her temazepam.   Pharm:  [facility]

## 2016-04-24 NOTE — Telephone Encounter (Signed)
That's fine. Change the rx to #90 with one rf

## 2016-04-25 ENCOUNTER — Encounter: Payer: Self-pay | Admitting: Family Medicine

## 2016-04-25 MED ORDER — TEMAZEPAM 30 MG PO CAPS: 30.0000 mg | ORAL_CAPSULE | Freq: Every evening | ORAL | 2 refills | Status: DC | PRN

## 2016-04-25 NOTE — Telephone Encounter (Signed)
I spoke with pharmacy and script was called in on 04/24/2016.

## 2016-04-25 NOTE — Telephone Encounter (Signed)
° ° ° °  Pt call to fup on her refill request for 90 day supply [facility]   TEMAZEPAM   Asking if med can be called in today

## 2016-08-13 ENCOUNTER — Telehealth: Payer: Self-pay | Admitting: Family Medicine

## 2016-08-13 ENCOUNTER — Encounter: Payer: Self-pay | Admitting: Family Medicine

## 2016-08-13 NOTE — Telephone Encounter (Signed)
° ° ° °  Pt call to say she need a copy of her TDAP fax over tho [facility] at the below fax number    [phone]

## 2016-08-14 NOTE — Telephone Encounter (Signed)
I faxed copy of TDAP to below number.

## 2016-08-28 ENCOUNTER — Telehealth: Payer: Self-pay | Admitting: Family Medicine

## 2016-08-28 NOTE — Telephone Encounter (Signed)
° ° ° ° °  Pt said she had spoken to you about a 90 day supply for the below med. She said she spoke with the pharmacy and they told her that the RX was not for 90 days. Pt call to ask if her RX can be sent in to the her pharmacy for a 90 day supply    temazepam (RESTORIL) 30 MG capsule 90 day supply   [facility]

## 2016-08-28 NOTE — Telephone Encounter (Signed)
I spoke with pharmacy and gave verbal from march 2018 order.

## 2016-08-29 ENCOUNTER — Other Ambulatory Visit: Payer: Self-pay

## 2016-08-29 NOTE — Telephone Encounter (Signed)
Pt is requesting for a 90 days supply, Rx is not due for refill until 10/26/16

## 2016-09-08 ENCOUNTER — Other Ambulatory Visit: Payer: Self-pay | Admitting: Family Medicine

## 2016-10-16 ENCOUNTER — Telehealth: Payer: 59 | Admitting: Family

## 2016-10-16 DIAGNOSIS — G43909 Migraine, unspecified, not intractable, without status migrainosus: Secondary | ICD-10-CM

## 2016-10-16 NOTE — Progress Notes (Signed)
Based on what you shared with me it looks like you have a serious condition that should be evaluated in a face to face office visit.  NOTE: Even if you have entered your credit card information for this eVisit, you will not be charged.   If you are having a true medical emergency please call 911.  If you need an urgent face to face visit, [facility] has four urgent care centers for your convenience.  If you need care fast and have a high deductible or no insurance consider:   [email or web address]  [phone]  [address] [place], [place] [address] 8 am to 8 pm Monday-Friday 10 am to 4 pm Saturday-Sunday   The following sites will take your  insurance:    . [facility] [place] a Provider at this Location  [address] [place], [place] [address] . 10 am to 8 pm Monday-Friday . 12 pm to 8 pm Saturday-Sunday   . [facility] [place] a Provider at this Location  [address] [place], [place], [place] [address] . 8 am to 8 pm Monday-Friday . 9 am to 6 pm Saturday . 11 am to 6 pm Sunday   . [facility] [place] Get Driving Directions  [address] [place], [place] [address] . 8 am to 8 pm Monday-Friday . 8 am to 4 pm Saturday-Sunday   Your e-visit answers were reviewed by a board certified advanced clinical practitioner to complete your personal care plan.  Thank you for using e-Visits.

## 2016-10-17 ENCOUNTER — Encounter: Payer: Self-pay | Admitting: Family Medicine

## 2016-10-31 ENCOUNTER — Other Ambulatory Visit: Payer: Self-pay | Admitting: Family Medicine

## 2016-10-31 DIAGNOSIS — Z1239 Encounter for other screening for malignant neoplasm of breast: Secondary | ICD-10-CM

## 2016-11-20 ENCOUNTER — Ambulatory Visit
Admission: RE | Admit: 2016-11-20 | Discharge: 2016-11-20 | Disposition: A | Discharge status: Home / Self Care | Payer: 59 | Source: Ambulatory Visit | Attending: Family Medicine | Admitting: Family Medicine

## 2016-11-20 DIAGNOSIS — Z1239 Encounter for other screening for malignant neoplasm of breast: Secondary | ICD-10-CM

## 2016-11-20 DIAGNOSIS — Z1231 Encounter for screening mammogram for malignant neoplasm of breast: Secondary | ICD-10-CM | POA: Diagnosis not present

## 2016-11-26 MED FILL — TEMAZEPAM 30 MG CAPSULE: 30 | 90 days supply | Qty: 90 | Fill #0

## 2016-12-03 ENCOUNTER — Telehealth: Payer: Self-pay | Admitting: Family Medicine

## 2016-12-03 NOTE — Telephone Encounter (Signed)
Pt need new Rx for omeprazole  Pharm:  [facility] outpt pharmacy

## 2016-12-05 MED ORDER — OMEPRAZOLE 40 MG PO CPDR: 40.0000 mg | DELAYED_RELEASE_CAPSULE | Freq: Every day | ORAL | 0 refills | Status: DC

## 2016-12-05 MED FILL — OMEPRAZOLE DR 40 MG CAPSULE: 40 | 90 days supply | Qty: 90 | Fill #0

## 2016-12-05 NOTE — Telephone Encounter (Signed)
I sent script e-scribe to below pharmacy.

## 2017-01-23 ENCOUNTER — Telehealth: Payer: 59 | Admitting: Family

## 2017-01-23 DIAGNOSIS — J029 Acute pharyngitis, unspecified: Secondary | ICD-10-CM | POA: Diagnosis not present

## 2017-01-23 MED ORDER — PREDNISONE 5 MG PO TABS: 5.0000 mg | ORAL_TABLET | ORAL | 0 refills | Status: DC

## 2017-01-23 MED ORDER — BENZONATATE 100 MG PO CAPS: 100.0000 mg | ORAL_CAPSULE | Freq: Three times a day (TID) | ORAL | 0 refills | Status: DC | PRN

## 2017-01-23 NOTE — Progress Notes (Signed)
Thank you for the details you included in the comment boxes. Those details are very helpful in determining the best course of treatment for you and help [place] to provide the best care. The info below is for sinus AND cough.   We are sorry that you are not feeling well.  Here is how we plan to help!  Based on your presentation I believe you most likely have A cough due to a virus.  This is called viral bronchitis and is best treated by rest, plenty of fluids and control of the cough.  You may use Ibuprofen or Tylenol as directed to help your symptoms.     In addition you may use A non-prescription cough medication called Mucinex DM: take 2 tablets every 12 hours. and A prescription cough medication called Tessalon Perles 191m. You may take 1-2 capsules every 8 hours as needed for your cough.  Sterapred 5 mg dosepak  From your responses in the eVisit questionnaire you describe inflammation in the upper respiratory tract which is causing a significant cough.  This is commonly called Bronchitis and has four common causes:    Allergies  Viral Infections  Acid Reflux  Bacterial Infection Allergies, viruses and acid reflux are treated by controlling symptoms or eliminating the cause. An example might be a cough caused by taking certain blood pressure medications. You stop the cough by changing the medication. Another example might be a cough caused by acid reflux. Controlling the reflux helps control the cough.  USE OF BRONCHODILATOR ("RESCUE") INHALERS: There is a risk from using your bronchodilator too frequently.  The risk is that over-reliance on a medication which only relaxes the muscles surrounding the breathing tubes can reduce the effectiveness of medications prescribed to reduce swelling and congestion of the tubes themselves.  Although you feel brief relief from the bronchodilator inhaler, your asthma may actually be worsening with the tubes becoming more swollen and filled with mucus.  This  can delay other crucial treatments, such as oral steroid medications. If you need to use a bronchodilator inhaler daily, several times per day, you should discuss this with your provider.  There are probably better treatments that could be used to keep your asthma under control.     HOME CARE . Only take medications as instructed by your medical team. . Complete the entire course of an antibiotic. . Drink plenty of fluids and get plenty of rest. . Avoid close contacts especially the very young and the elderly . Cover your mouth if you cough or cough into your sleeve. . Always remember to wash your hands . A steam or ultrasonic humidifier can help congestion.   GET HELP RIGHT AWAY IF: . You develop worsening fever. . You become short of breath . You cough up blood. . Your symptoms persist after you have completed your treatment plan MAKE SURE YOU   Understand these instructions.  Will watch your condition.  Will get help right away if you are not doing well or get worse.  Your e-visit answers were reviewed by a board certified advanced clinical practitioner to complete your personal care plan.  Depending on the condition, your plan could have included both over the counter or prescription medications. If there is a problem please reply  once you have received a response from your provider. Your safety is important to [place]  If you have drug allergies check your prescription carefully.    You can use MyChart to ask questions about today's visit, request  a non-urgent call back, or ask for a work or school excuse for 24 hours related to this e-Visit. If it has been greater than 24 hours you will need to follow up with your provider, or enter a new e-Visit to address those concerns. You will get an e-mail in the next two days asking about your experience.  I hope that your e-visit has been valuable and will speed your recovery. Thank you for using e-visits.

## 2017-01-28 DIAGNOSIS — J4521 Mild intermittent asthma with (acute) exacerbation: Secondary | ICD-10-CM | POA: Diagnosis not present

## 2017-02-19 ENCOUNTER — Observation Stay (HOSPITAL_COMMUNITY)
Admission: EM | Admit: 2017-02-19 | Discharge: 2017-02-20 | Disposition: A | Discharge status: Home / Self Care | Payer: 59 | Attending: Interventional Cardiology | Admitting: Interventional Cardiology

## 2017-02-19 ENCOUNTER — Emergency Department (HOSPITAL_COMMUNITY): Payer: 59

## 2017-02-19 ENCOUNTER — Encounter: Payer: Self-pay | Admitting: Family Medicine

## 2017-02-19 ENCOUNTER — Encounter (HOSPITAL_COMMUNITY): Payer: Self-pay

## 2017-02-19 ENCOUNTER — Ambulatory Visit: Payer: 59 | Admitting: Family Medicine

## 2017-02-19 ENCOUNTER — Other Ambulatory Visit: Payer: Self-pay

## 2017-02-19 VITALS — BP 118/76 | HR 66 | Temp 98.4°F | Wt 310.8 lb

## 2017-02-19 DIAGNOSIS — K219 Gastro-esophageal reflux disease without esophagitis: Secondary | ICD-10-CM | POA: Insufficient documentation

## 2017-02-19 DIAGNOSIS — I700 Atherosclerosis of aorta: Secondary | ICD-10-CM | POA: Diagnosis not present

## 2017-02-19 DIAGNOSIS — J449 Chronic obstructive pulmonary disease, unspecified: Secondary | ICD-10-CM | POA: Diagnosis not present

## 2017-02-19 DIAGNOSIS — R0602 Shortness of breath: Secondary | ICD-10-CM | POA: Insufficient documentation

## 2017-02-19 DIAGNOSIS — E785 Hyperlipidemia, unspecified: Secondary | ICD-10-CM | POA: Insufficient documentation

## 2017-02-19 DIAGNOSIS — E039 Hypothyroidism, unspecified: Secondary | ICD-10-CM | POA: Insufficient documentation

## 2017-02-19 DIAGNOSIS — Z882 Allergy status to sulfonamides status: Secondary | ICD-10-CM | POA: Diagnosis not present

## 2017-02-19 DIAGNOSIS — Z8249 Family history of ischemic heart disease and other diseases of the circulatory system: Secondary | ICD-10-CM | POA: Diagnosis not present

## 2017-02-19 DIAGNOSIS — R079 Chest pain, unspecified: Secondary | ICD-10-CM | POA: Diagnosis present

## 2017-02-19 DIAGNOSIS — I10 Essential (primary) hypertension: Secondary | ICD-10-CM | POA: Insufficient documentation

## 2017-02-19 DIAGNOSIS — I2510 Atherosclerotic heart disease of native coronary artery without angina pectoris: Secondary | ICD-10-CM | POA: Diagnosis not present

## 2017-02-19 DIAGNOSIS — R0789 Other chest pain: Secondary | ICD-10-CM | POA: Diagnosis not present

## 2017-02-19 DIAGNOSIS — R110 Nausea: Secondary | ICD-10-CM | POA: Insufficient documentation

## 2017-02-19 DIAGNOSIS — Z6841 Body Mass Index (BMI) 40.0 and over, adult: Secondary | ICD-10-CM | POA: Diagnosis not present

## 2017-02-19 DIAGNOSIS — Z79899 Other long term (current) drug therapy: Secondary | ICD-10-CM | POA: Insufficient documentation

## 2017-02-19 DIAGNOSIS — I059 Rheumatic mitral valve disease, unspecified: Secondary | ICD-10-CM | POA: Diagnosis not present

## 2017-02-19 DIAGNOSIS — R05 Cough: Secondary | ICD-10-CM | POA: Diagnosis not present

## 2017-02-19 DIAGNOSIS — I252 Old myocardial infarction: Secondary | ICD-10-CM | POA: Diagnosis not present

## 2017-02-19 DIAGNOSIS — Z7982 Long term (current) use of aspirin: Secondary | ICD-10-CM | POA: Diagnosis not present

## 2017-02-19 DIAGNOSIS — Z87891 Personal history of nicotine dependence: Secondary | ICD-10-CM | POA: Insufficient documentation

## 2017-02-19 LAB — BASIC METABOLIC PANEL
ANION GAP: 12 (ref 5–15)
BUN: 9 mg/dL (ref 6–20)
CALCIUM: 8.9 mg/dL (ref 8.9–10.3)
CO2: 25 mmol/L (ref 22–32)
Chloride: 104 mmol/L (ref 101–111)
Creatinine, Ser: 1.15 mg/dL — ABNORMAL HIGH (ref 0.44–1.00)
GFR calc Af Amer: 59 mL/min — ABNORMAL LOW (ref >60)
GFR, EST NON AFRICAN AMERICAN: 51 mL/min — AB (ref >60)
Glucose, Bld: 90 mg/dL (ref 65–99)
Potassium: 3.8 mmol/L (ref 3.5–5.1)
Sodium: 141 mmol/L (ref 135–145)

## 2017-02-19 LAB — CBC
HCT: 44.1 % (ref 36.0–46.0)
HCT: 41.7 % (ref 36.0–46.0)
HEMOGLOBIN: 14.9 g/dL (ref 12.0–15.0)
Hemoglobin: 14 g/dL (ref 12.0–15.0)
MCH: 30 pg (ref 26.0–34.0)
MCH: 30 pg (ref 26.0–34.0)
MCHC: 33.8 g/dL (ref 30.0–36.0)
MCHC: 33.6 g/dL (ref 30.0–36.0)
MCV: 88.9 fL (ref 78.0–100.0)
MCV: 89.5 fL (ref 78.0–100.0)
PLATELETS: 96 10*3/uL — AB (ref 150–400)
Platelets: 106 10*3/uL — ABNORMAL LOW (ref 150–400)
RBC: 4.96 MIL/uL (ref 3.87–5.11)
RBC: 4.66 MIL/uL (ref 3.87–5.11)
RDW: 13.5 % (ref 11.5–15.5)
RDW: 13.6 % (ref 11.5–15.5)
WBC: 6 10*3/uL (ref 4.0–10.5)
WBC: 5.3 10*3/uL (ref 4.0–10.5)

## 2017-02-19 LAB — BRAIN NATRIURETIC PEPTIDE: B Natriuretic Peptide: 29.5 pg/mL (ref 0.0–100.0)

## 2017-02-19 LAB — CREATININE, SERUM
CREATININE: 1.27 mg/dL — AB (ref 0.44–1.00)
GFR, EST AFRICAN AMERICAN: 52 mL/min — AB (ref >60)
GFR, EST NON AFRICAN AMERICAN: 45 mL/min — AB (ref >60)

## 2017-02-19 LAB — I-STAT BETA HCG BLOOD, ED (MC, WL, AP ONLY): HCG, QUANTITATIVE: 5.5 m[IU]/mL — AB (ref <5)

## 2017-02-19 LAB — I-STAT TROPONIN, ED: TROPONIN I, POC: 0 ng/mL (ref 0.00–0.08)

## 2017-02-19 LAB — TROPONIN I

## 2017-02-19 MED ORDER — LEVOTHYROXINE SODIUM 100 MCG PO TABS
200.0000 ug | ORAL_TABLET | Freq: Every day | ORAL | Status: DC
  Administered 2017-02-20: 200 ug via ORAL
  Filled 2017-02-19: qty 2

## 2017-02-19 MED ORDER — ACETAMINOPHEN 325 MG PO TABS
650.0000 mg | ORAL_TABLET | ORAL | Status: DC | PRN
  Administered 2017-02-19 – 2017-02-20 (×2): 650 mg via ORAL
  Filled 2017-02-19 (×2): qty 2

## 2017-02-19 MED ORDER — NITROGLYCERIN 0.4 MG SL SUBL: 0.4000 mg | SUBLINGUAL_TABLET | SUBLINGUAL | Status: DC | PRN

## 2017-02-19 MED ORDER — GI COCKTAIL ~~LOC~~
30.0000 mL | Freq: Once | ORAL | Status: AC
  Administered 2017-02-19: 30 mL via ORAL
  Filled 2017-02-19: qty 30

## 2017-02-19 MED ORDER — LORATADINE 10 MG PO TABS
10.0000 mg | ORAL_TABLET | Freq: Every day | ORAL | Status: DC
  Administered 2017-02-20: 10 mg via ORAL
  Filled 2017-02-19: qty 1

## 2017-02-19 MED ORDER — ATORVASTATIN CALCIUM 20 MG PO TABS
20.0000 mg | ORAL_TABLET | Freq: Every day | ORAL | Status: DC
  Administered 2017-02-19: 20 mg via ORAL
  Filled 2017-02-19: qty 1

## 2017-02-19 MED ORDER — NITROGLYCERIN 0.4 MG SL SUBL
0.4000 mg | SUBLINGUAL_TABLET | SUBLINGUAL | Status: DC | PRN
  Administered 2017-02-19 (×3): 0.4 mg via SUBLINGUAL
  Filled 2017-02-19 (×2): qty 1

## 2017-02-19 MED ORDER — ONDANSETRON HCL 4 MG/2ML IJ SOLN: 4.0000 mg | Freq: Four times a day (QID) | INTRAMUSCULAR | Status: DC | PRN

## 2017-02-19 MED ORDER — MORPHINE SULFATE (PF) 4 MG/ML IV SOLN
4.0000 mg | Freq: Once | INTRAVENOUS | Status: AC
  Administered 2017-02-19: 4 mg via INTRAVENOUS
  Filled 2017-02-19: qty 1

## 2017-02-19 MED ORDER — HEPARIN SODIUM (PORCINE) 5000 UNIT/ML IJ SOLN
5000.0000 [IU] | Freq: Three times a day (TID) | INTRAMUSCULAR | Status: DC
  Administered 2017-02-19 – 2017-02-20 (×2): 5000 [IU] via SUBCUTANEOUS
  Filled 2017-02-19 (×2): qty 1

## 2017-02-19 MED ORDER — ADULT MULTIVITAMIN W/MINERALS CH
1.0000 | ORAL_TABLET | Freq: Every day | ORAL | Status: DC
  Administered 2017-02-20: 1 via ORAL
  Filled 2017-02-19 (×2): qty 1

## 2017-02-19 MED ORDER — ONDANSETRON HCL 4 MG/2ML IJ SOLN
4.0000 mg | Freq: Once | INTRAMUSCULAR | Status: AC
  Administered 2017-02-19: 4 mg via INTRAVENOUS
  Filled 2017-02-19: qty 2

## 2017-02-19 MED ORDER — PANTOPRAZOLE SODIUM 40 MG PO TBEC
40.0000 mg | DELAYED_RELEASE_TABLET | Freq: Every day | ORAL | Status: DC
  Administered 2017-02-20: 40 mg via ORAL
  Filled 2017-02-19: qty 1

## 2017-02-19 MED ORDER — VITAMIN D 1000 UNITS PO TABS
1000.0000 [IU] | ORAL_TABLET | Freq: Every day | ORAL | Status: DC
  Administered 2017-02-20: 1000 [IU] via ORAL
  Filled 2017-02-19: qty 1

## 2017-02-19 MED ORDER — LOSARTAN POTASSIUM 50 MG PO TABS
100.0000 mg | ORAL_TABLET | Freq: Every day | ORAL | Status: DC
  Administered 2017-02-20: 100 mg via ORAL
  Filled 2017-02-19: qty 2

## 2017-02-19 MED ORDER — ASPIRIN EC 81 MG PO TBEC
81.0000 mg | DELAYED_RELEASE_TABLET | Freq: Every day | ORAL | Status: DC
  Administered 2017-02-19: 81 mg via ORAL
  Filled 2017-02-19: qty 1

## 2017-02-19 MED ORDER — VITAMIN E 180 MG (400 UNIT) PO CAPS
400.0000 [IU] | ORAL_CAPSULE | Freq: Every day | ORAL | Status: DC
  Administered 2017-02-20: 400 [IU] via ORAL
  Filled 2017-02-19: qty 1

## 2017-02-19 MED ORDER — ASPIRIN EC 81 MG PO TBEC: 81.0000 mg | DELAYED_RELEASE_TABLET | Freq: Every day | ORAL | Status: DC

## 2017-02-19 MED ORDER — MORPHINE SULFATE (PF) 4 MG/ML IV SOLN
4.0000 mg | Freq: Once | INTRAVENOUS | Status: AC
Start: 2017-02-19 — End: 2017-02-19
  Administered 2017-02-19: 4 mg via INTRAVENOUS
  Filled 2017-02-19: qty 1

## 2017-02-19 MED ORDER — TEMAZEPAM 15 MG PO CAPS
30.0000 mg | ORAL_CAPSULE | Freq: Every evening | ORAL | Status: DC | PRN
  Administered 2017-02-19: 30 mg via ORAL
  Filled 2017-02-19: qty 2

## 2017-02-19 NOTE — ED Provider Notes (Signed)
[facility] EMERGENCY DEPARTMENT Provider Note   CSN: [id number] Arrival date & time: 02/19/17  1046     History   Chief Complaint Chief Complaint  Patient presents with  . Chest Pain    HPI Nancy [name] is a 62 y.o. female.  HPI   62 year old female with history of CAD, prior MI, COPD, GERD, hypertension, hyperlipidemia presenting complaining of chest pain. patient develop pain to her chest that started last night and has been persistent since. She described pain as a pressure sensation to her mid chest radiates to her left shoulder. She endorse some mild shortness of breath and nausea. Pain is moderate in severity and regular 7 out of 10. He has mildly improved after receiving sublingual nitroglycerin given prior to arrival. States she has MI in the past and this pain felt somewhat similar.she mentioned her last heart catheterization was about 5 years ago that shows nonocclusive disease. She was initially seen by her PCP for her complaint and was sent here for further evaluation. Her pain has not fully resolved. She had cold symptoms 2 weeks ago receiving treatment has finished with that. She endorses stress but not more than usual. She does not report any fever, chills, headache, pleuritic chest pain, productive cough, abdominal pain, back pain, focal numbness or weakness. She is not a smoker or drinker. Denies any recent medication changes. her MI was nearly 20 years ago and happened after she had an endometrial ablation. Her cardiac catheterization at that time shows no obstructive disease.  Past Medical History:  Diagnosis Date  . Allergic rhinitis    gets shots per Dr. Harold Hedge   . Anemia 2001  . Anginal pain ([place])   . COPD (chronic obstructive pulmonary disease) ([place])    "CXR just showed mild COPD" (10/24/2011)  . Coronary artery disease    had MI in 2001, see[facility] Fletcher [facility]  . GERD (gastroesophageal reflux disease)   . Gynecological  examination    sees Dr. Cherylann Banas   . History of cardiac catheterization 07-24-07   showed nonconclusive disease  . Hyperlipidemia   . Hypertension   . Hypothyroidism   . Migraines    "have a history of migraines; haven't had one for years" (10/24/2011)  . Myocardial infarction [facility]) 2001?    Patient Active Problem List   Diagnosis Date Noted  . Chest pain 10/24/2011  . LIPOMA 09/12/2008  . Coronary atherosclerosis 07/30/2007  . ANEMIA-NOS 11/10/2006  . MENOPAUSAL SYNDROME 11/10/2006  . HEADACHE 11/10/2006  . CHICKENPOX, HX OF 11/10/2006  . Hypothyroidism 09/25/2006  . HYPERLIPIDEMIA 09/25/2006  . Essential hypertension 09/25/2006  . ALLERGIC RHINITIS 09/25/2006  . GERD 09/25/2006    Past Surgical History:  Procedure Laterality Date  . CARDIAC CATHETERIZATION  2009  . CESAREAN SECTION  1984  . COLONOSCOPY  10-06-08   per Dr. Henrene Pastor, benign polyps, repeat in 10 yrs   . HERNIA REPAIR  ~ 2007   ventral hernia repair  . THYROIDECTOMY  1990's  . TONSILLECTOMY     "when I was a kid"    OB History    No data available       Home Medications    Prior to Admission medications   Medication Sig Start Date End Date Taking? Authorizing Provider  aspirin 325 MG tablet Take 325 mg by mouth daily.      [provider]  atorvastatin (LIPITOR) 20 MG tablet TAKE 1 TABLET BY MOUTH EVERY DAY 09/09/16  Laurey Morale, MD  Black Cohosh 40 MG CAPS Take 1 capsule by mouth daily.      [provider]  cetirizine (ZYRTEC) 10 MG tablet Take 10 mg by mouth daily.    [provider]  cholecalciferol (VITAMIN D) 1000 units tablet Take 1,000 Units by mouth daily.    [provider]  levothyroxine (SYNTHROID, LEVOTHROID) 200 MCG tablet TAKE 1 TABLET BY MOUTH EVERY DAY 09/09/16   Laurey Morale, MD  losartan (COZAAR) 100 MG tablet TAKE 1 TABLET BY MOUTH EVERY DAY 09/09/16   Laurey Morale, MD  Multiple Vitamin (MULTIVITAMIN) tablet Take 1 tablet by mouth daily.       [provider]  nitroGLYCERIN (NITROSTAT) 0.4 MG SL tablet Place 1 tablet (0.4 mg total) under the tongue every 5 (five) minutes as needed for chest pain. 01/20/14   Laurey Morale, MD  omeprazole (PRILOSEC) 40 MG capsule Take 1 capsule (40 mg total) daily by mouth. 12/05/16   Laurey Morale, MD  temazepam (RESTORIL) 30 MG capsule Take 1 capsule (30 mg total) by mouth at bedtime as needed for sleep. 04/25/16 11/10/16  Laurey Morale, MD  traMADol (ULTRAM) 50 MG tablet Take 2 tablets (100 mg total) by mouth every 8 (eight) hours as needed for moderate pain. 09/08/15   Laurey Morale, MD  vitamin E (VITAMIN E) 400 UNIT capsule Take 400 Units by mouth daily.      [provider]    Family History Family History  Problem Relation Age of Onset  . Breast cancer Mother   . Pulmonary fibrosis Mother   . Coronary artery disease Father   . Hypertension Father   . Cancer Other        breast,colon,prostate  . Alcohol abuse Other   . Hyperlipidemia Other   . Hypertension Other   . Stroke Other   . Heart disease Other     Social History Social History   Tobacco Use  . Smoking status: Former Smoker    Packs/day: 1.00    Years: 30.00    Pack years: 30.00    Types: Cigarettes    Last attempt to quit: 10/24/2006    Years since quitting: 10.3  . Smokeless tobacco: Never Used  Substance Use Topics  . Alcohol use: No    Alcohol/week: 0.0 oz  . Drug use: No     Allergies   Sulfamethoxazole   Review of Systems Review of Systems  All other systems reviewed and are negative.    Physical Exam Updated Vital Signs BP 133/77   Pulse 66   Temp 98.6 F (37 C) (Oral)   Resp 11   SpO2 100%   Physical Exam  Constitutional: She appears well-developed and well-nourished. No distress.  HENT:  Head: Atraumatic.  Eyes: Conjunctivae are normal.  Neck: Neck supple.  Cardiovascular: Normal rate, regular rhythm, intact distal pulses and normal pulses.  Pulmonary/Chest:  Effort normal and breath sounds normal. She has no decreased breath sounds. She has no wheezes. She has no rhonchi. She has no rales.  Abdominal: Soft. There is no tenderness.  Musculoskeletal: Normal range of motion.       Right lower leg: Normal.       Left lower leg: Normal.  Neurological: She is alert.  Skin: No rash noted.  Psychiatric: She has a normal mood and affect.  Nursing note and vitals reviewed.    ED Treatments / Results  Labs (all labs ordered  are listed, but only abnormal results are displayed) Labs Reviewed  BASIC METABOLIC PANEL - Abnormal; Notable for the following components:      Result Value   Creatinine, Ser 1.15 (*)    GFR calc non Af Amer 51 (*)    GFR calc Af Amer 59 (*)    All other components within normal limits  CBC - Abnormal; Notable for the following components:   Platelets 106 (*)    All other components within normal limits  I-STAT BETA HCG BLOOD, ED ([facility], [facility], [facility] ONLY) - Abnormal; Notable for the following components:   I-stat hCG, quantitative 5.5 (*)    All other components within normal limits  I-STAT TROPONIN, ED    EKG  EKG Interpretation  Date/Time:  Wednesday February 19 2017 10:50:19 EST Ventricular Rate:  68 PR Interval:  180 QRS Duration: 88 QT Interval:  402 QTC Calculation: 427 R Axis:   -41 Text Interpretation:  Normal sinus rhythm Left axis deviation Anteroseptal infarct , age undetermined Abnormal ECG No significant change since last tracing Confirmed by Wandra Arthurs [phone]) on 02/19/2017 12:32:39 PM       Radiology Dg Chest 2 View  Result Date: 02/19/2017 CLINICAL DATA:  Midline to left sided CP with left arm pain and SOB x yesterday, HTN, past smoker - quit 11 years, pt shielded EXAM: CHEST - 2 VIEW COMPARISON:  06/23/2014 FINDINGS: Lungs are clear. Heart size and mediastinal contours are within normal limits. Aortic Atherosclerosis (ICD10-170.0) No effusion.  No pneumothorax. Visualized bones unremarkable. IMPRESSION:  No acute cardiopulmonary disease. Electronically Signed   By: Lucrezia Europe M.D.   On: 02/19/2017 13:17    Procedures Procedures (including critical care time)  Medications Ordered in ED Medications  nitroGLYCERIN (NITROSTAT) SL tablet 0.4 mg (0.4 mg Sublingual Given 02/19/17 1226)     Initial Impression / Assessment and Plan / ED Course  I have reviewed the triage vital signs and the nursing notes.  Pertinent labs & imaging results that were available during my care of the patient were reviewed by me and considered in my medical decision making (see chart for details).     BP 123/64 (BP Location: Right Arm)   Pulse 70   Temp 98.6 F (37 C) (Oral)   Resp 14   SpO2 99%    Final Clinical Impressions(s) / ED Diagnoses   Final diagnoses:  Chest pain, unspecified type    ED Discharge Orders    None     1:08 PM Patient here with midsternal chest pain since last night. Reported history of prior MI which sounds to be demand ischemia after she received an endometrial ablation. She reported normal heart cath 5 years ago. Her heart score is 4, which puts her at a moderate risk of MACE.  Her pain still active.  2:24 PM Although receiving additional pain medication and GI cocktail her pain did improve however pain is still presence and rates as 5 out of 10. Initial EKG, troponin, and labs are within normal limits. Mildly elevated quantitative hCG of 5.5, patient is not pregnant as she is postmenopausal.  Plan to consult cardiology for further management. Low suspicion for PE as pt does not have any significant risk factors.   2:36 PM Appreciate consultation from Tennis Must who will request cardiology to see pt.  Pt is aware of plan.  Care discussed with Dr. Darl Householder.    3:42 PM Pt sign out to oncoming provider who will f/u on cardiology  recommendation for disposition    Domenic Moras, PA-C 02/19/17 1543    Drenda Freeze, MD 02/19/17 [phone]

## 2017-02-19 NOTE — ED Triage Notes (Signed)
Pt sent in from pcp for central chest pain that radiates to left arm and shoulder pain since yesterday morning. Pt denies n/v, diaphoresis. Endorses some sob and dizziness.

## 2017-02-19 NOTE — Progress Notes (Signed)
Pt arrived to unit. Oriented to the room. Denies chest pain at this time, admission database completed. Pt ordered dinner. Vitals documented. Will continue to monitor

## 2017-02-19 NOTE — ED Notes (Signed)
On way to XR 

## 2017-02-19 NOTE — ED Notes (Signed)
This nurse spoke to Dr. Tamala Julian in Cardiology and he stated that the pain the patient is having is not cardiac pain and that the morphine given to her is probably too much medicine to be giving anyways.  He wants to have admitting doctor increase her proton inhibitor to see if it will help pain.  There is no coronary blockage.

## 2017-02-19 NOTE — ED Provider Notes (Signed)
Care briefly assumed at shift change from Domenic Moras, PA-C, pending cardiology evaluation and disposition.  See his note for full HPI and workup.  Briefly, patient reported to the ED for nonexertional chest pain and is having active CP in ED. Cardiology to see patient to determine admission versus discharge home.  Per Cardiology note, Cardiology recommending admission and will admit patient to cycle troponins, ECHO and rule out arrhythmias.   Collan Schoenfeld, [place] N, PA-C 02/19/17 1611    Drenda Freeze, MD 02/19/17 Drema Halon

## 2017-02-19 NOTE — ED Notes (Signed)
Tried giving report to 3E and charge nurse stated that patient is not appropriate for their floor due to active chest pain and q1h interventions. Paging admitting doctor.

## 2017-02-19 NOTE — H&P (Signed)
The patient has been seen in conjunction with Vin Bhagat PA-C. All aspects of care have been considered and discussed. The patient has been personally interviewed, examined, and all clinical data has been reviewed.   Greater than 36 hours of chest pressure of mild to moderate intensity with no EKG or biomarker evidence of ischemia/injury.  Morbidly obese.  No chest wall tenderness.  EKG shows poor R wave progression with a pattern that raises the question of prior anterior infarction.  These changes are old dating back beyond 2013.  Coronary disease has never been identified over the past 20 years with recurring episodes of similar chest pain.  2 prior catheterizations most recently in 2009 without significant obstructive disease.  Negative myocardial perfusion study 2013.  Observation stay with continued cycling of enzymes and serial EKGs.  Intensify proton pump inhibitor therapy for the possibility of reflux.  If she rules out, she will likely be discharged tomorrow for outpatient follow-up and further evaluation.   Cardiology Admission History and Physical:   Patient ID: Nancy [name]; MRN: [id number]; DOB: 1955-02-20   Admission date: 02/19/2017  Primary Care Provider: Laurey Morale, MD Primary Cardiologist:New to [facility]  Chief Complaint: Chest pain  Patient Profile:   Nancy [name] is a 62 y.o. female with a history of CAD, hyperlipidemia, obesity, hypertension, mild COPD, 20-pack-year tobacco history  (quit 11 years ago) and GERD sent for PCP office from evaluation of chest pain.  Her cardiac history dates back to 2001 when she reports having an heart attack and subsequent catheterization where no blockages were found.  It was felt that she had a atypical heart attack in setting of endometrial ablation. She had a pharmacologic stress MPI in 2003 which she reports was negative. She presented to [place] in 06/2007 with chest pain  and underwent a cardiac cath which she  reports showed 30% blockages. No stents or interventions performed.  Last seen by Dr. Elias Else and Dr. Percival Spanish in 2013 during admission for chest pain.  No ischemia found on stress test.  It was 2 days study.  No cardiology follow-up since then.  History of Present Illness:   Ms. Tramel woke up with substernal chest pressure Tuesday morning at 4 AM.  She took some expired nitroglycerin with minimal to no improvement.  Her pain was associated with shortness of breath and radiation to her left shoulder.  Her pain was constant with minimal relief and exacerbation intermittently. Similar when she had cath in 2009. She did felt intermittent heart racing with exacerbation of chest pain.  This morning she went to see PCP due to ongoing symptoms and referred to ER for further evaluation.   Her pain now improved 5/10 after GI cocktail, IV morphine and sublingual nitroglycerin x 3 in emergency department. Point-of-care troponin Negative.  Creatinine 1.5.  Platelets 106.  Chest x-ray without acute abnormality.  EKG shows normal sinus rhythm at rate of 68 bpm-personally reviewed.  Patient denies any regular exercise.  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  No limiting symptoms.  Has noted intermittent lower extremity edema.  Denies orthopnea, PND, syncope or melena.  Past Medical History:  Diagnosis Date  . Allergic rhinitis    gets shots per Dr. Harold Hedge   . Anemia 2001  . Anginal pain ([place])   . COPD (chronic obstructive pulmonary disease) ([place])    "CXR just showed mild COPD" (10/24/2011)  . Coronary artery disease    had MI in 2001, seees Dr.  Arida at [facility]  . GERD (gastroesophageal reflux disease)   . Gynecological examination    sees Dr. Cherylann Banas   . History of cardiac catheterization 07-24-07   showed nonconclusive disease  . Hyperlipidemia   . Hypertension   . Hypothyroidism   . Migraines    "have a history of migraines; haven't had one for years" (10/24/2011)   . Myocardial infarction [facility]) 2001?    Past Surgical History:  Procedure Laterality Date  . CARDIAC CATHETERIZATION  2009  . CESAREAN SECTION  1984  . COLONOSCOPY  10-06-08   per Dr. Henrene Pastor, benign polyps, repeat in 10 yrs   . HERNIA REPAIR  ~ 2007   ventral hernia repair  . THYROIDECTOMY  1990's  . TONSILLECTOMY     "when I was a kid"     Medications Prior to Admission: Prior to Admission medications   Medication Sig Start Date End Date Taking? Authorizing Provider  aspirin 325 MG tablet Take 325 mg by mouth daily.      [provider]  atorvastatin (LIPITOR) 20 MG tablet TAKE 1 TABLET BY MOUTH EVERY DAY 09/09/16   Laurey Morale, MD  Black Cohosh 40 MG CAPS Take 1 capsule by mouth daily.      [provider]  cetirizine (ZYRTEC) 10 MG tablet Take 10 mg by mouth daily.    [provider]  cholecalciferol (VITAMIN D) 1000 units tablet Take 1,000 Units by mouth daily.    [provider]  levothyroxine (SYNTHROID, LEVOTHROID) 200 MCG tablet TAKE 1 TABLET BY MOUTH EVERY DAY 09/09/16   Laurey Morale, MD  losartan (COZAAR) 100 MG tablet TAKE 1 TABLET BY MOUTH EVERY DAY 09/09/16   Laurey Morale, MD  Multiple Vitamin (MULTIVITAMIN) tablet Take 1 tablet by mouth daily.      [provider]  nitroGLYCERIN (NITROSTAT) 0.4 MG SL tablet Place 1 tablet (0.4 mg total) under the tongue every 5 (five) minutes as needed for chest pain. 01/20/14   Laurey Morale, MD  omeprazole (PRILOSEC) 40 MG capsule Take 1 capsule (40 mg total) daily by mouth. 12/05/16   Laurey Morale, MD  temazepam (RESTORIL) 30 MG capsule Take 1 capsule (30 mg total) by mouth at bedtime as needed for sleep. 04/25/16 11/10/16  Laurey Morale, MD  traMADol (ULTRAM) 50 MG tablet Take 2 tablets (100 mg total) by mouth every 8 (eight) hours as needed for moderate pain. 09/08/15   Laurey Morale, MD  vitamin E (VITAMIN E) 400 UNIT capsule Take 400 Units by mouth daily.      [provider]     Allergies:    Allergies  Allergen Reactions  . Sulfamethoxazole Hives    Social History:   Social History   Socioeconomic History  . Marital status: Single    Spouse name: Not on file  . Number of children: Not on file  . Years of education: Not on file  . Highest education level: Not on file  Social Needs  . Financial resource strain: Not on file  . Food insecurity - worry: Not on file  . Food insecurity - inability: Not on file  . Transportation needs - medical: Not on file  . Transportation needs - non-medical: Not on file  Occupational History  . Not on file  Tobacco Use  . Smoking status: Former Smoker    Packs/day: 1.00    Years: 30.00    Pack years: 30.00  Types: Cigarettes    Last attempt to quit: 10/24/2006    Years since quitting: 10.3  . Smokeless tobacco: Never Used  Substance and Sexual Activity  . Alcohol use: No    Alcohol/week: 0.0 oz  . Drug use: No  . Sexual activity: No  Other Topics Concern  . Not on file  Social History Narrative  . Not on file    Family History:  The patient's family history includes Alcohol abuse in her other; Breast cancer in her mother; Cancer in her other; Coronary artery disease in her father; Heart disease in her other; Hyperlipidemia in her other; Hypertension in her father and other; Pulmonary fibrosis in her mother; Stroke in her other.    ROS:  Please see the history of present illness.  All other ROS reviewed and negative.     Physical Exam/Data:   Vitals:   02/19/17 1055 02/19/17 1121 02/19/17 1223 02/19/17 1257  BP: (!) 158/94 (!) 147/72 133/77 123/64  Pulse: 66  66 70  Resp: 16  11 14   Temp: 98.6 F (37 C)     TempSrc: Oral     SpO2: 100%  100% 99%    General: Morbidly obese female in no acute distress HEENT: normal Lymph: no adenopathy Neck: no JVD Endocrine:  No thryomegaly Vascular: No carotid bruits; FA pulses 2+ bilaterally without bruits  Cardiac:  normal S1, S2; RRR; no murmur    Lungs:  clear to auscultation bilaterally, no wheezing, rhonchi or rales  Abd: soft, nontender, no hepatomegaly  Ext: Trace bilateral lower extremity edema Musculoskeletal:  No deformities, BUE and BLE strength normal and equal Skin: warm and dry  Neuro:  CNs 2-12 intact, no focal abnormalities noted Psych:  Normal affect    Relevant CV Studies: As summarized above  Laboratory Data:  Chemistry Recent Labs  Lab 02/19/17 1057  NA 141  K 3.8  CL 104  CO2 25  GLUCOSE 90  BUN 9  CREATININE 1.15*  CALCIUM 8.9  GFRNONAA 51*  GFRAA 59*  ANIONGAP 12    No results for input(s): PROT, ALBUMIN, AST, ALT, ALKPHOS, BILITOT in the last 168 hours. Hematology Recent Labs  Lab 02/19/17 1057  WBC 6.0  RBC 4.96  HGB 14.9  HCT 44.1  MCV 88.9  MCH 30.0  MCHC 33.8  RDW 13.5  PLT 106*   Cardiac EnzymesNo results for input(s): TROPONINI in the last 168 hours.  Recent Labs  Lab 02/19/17 1112  TROPIPOC 0.00    BNPNo results for input(s): BNP, PROBNP in the last 168 hours.  DDimer No results for input(s): DDIMER in the last 168 hours.  Radiology/Studies:  Dg Chest 2 View  Result Date: 02/19/2017 CLINICAL DATA:  Midline to left sided CP with left arm pain and SOB x yesterday, HTN, past smoker - quit 11 years, pt shielded EXAM: CHEST - 2 VIEW COMPARISON:  06/23/2014 FINDINGS: Lungs are clear. Heart size and mediastinal contours are within normal limits. Aortic Atherosclerosis (ICD10-170.0) No effusion.  No pneumothorax. Visualized bones unremarkable. IMPRESSION: No acute cardiopulmonary disease. Electronically Signed   By: Lucrezia Europe M.D.   On: 02/19/2017 13:17    Assessment and Plan:   1. Chest pain -Her pain is concerning for angina however troponin negative and EKG without acute ischemic changes despite constant pain for greater than 30 hours.  Her pain is improving ER after IV morphine, GI cocktail and sublingual nitroglycerin x 3.  - admit. Cycle troponin. NPO after MN. Get  echo. R/o arrhythmias.   2. Non obstructive CAD - Prior cath in 2009 showed nonobstructive CAD per note.  Negative 2 days stress test in 2013.  3.  Hypertension - Resume home meds.   4. HLD - No results found for requested labs within last 8760 hours.  - Get lipid panel. Resume home statin.   Severity of Illness: The appropriate patient status for this patient is OBSERVATION. Observation status is judged to be reasonable and necessary in order to provide the required intensity of service to ensure the patient's safety. The patient's presenting symptoms, physical exam findings, and initial radiographic and laboratory data in the context of their medical condition is felt to place them at decreased risk for further clinical deterioration. Furthermore, it is anticipated that the patient will be medically stable for discharge from the hospital within 2 midnights of admission. The following factors support the patient status of observation.   " The patient's presenting symptoms include: Chest pain  " The physical exam findings include LE edema " The initial radiographic and laboratory data are non     For questions or updates, please contact [place] Please consult [email or web address] for contact info under Cardiology/STEMI.    Jarrett Soho, [place]  02/19/2017 2:56 PM

## 2017-02-19 NOTE — Progress Notes (Signed)
   Subjective:    Patient ID: Nancy [name], female    DOB: 12/10/1955, 62 y.o.   MRN: [id number]  HPI Here for pressure like pains across the chest, into both shoulders, and down the left arm. These woke her from sleep at 4 am yesterday morning and has persisted ever since. She has mild SOB, but no nausea or sweats. These pains remind her of the ones she felt during her heart attack in 2001. At that time her EKG was normal but she had a rise in cardiac enzymes and she was diagnosed with a mild MI. A subsequent cath revealed non-operable CAD. She has done well since then. No recent coughing or URI symptoms.    Review of Systems  Constitutional: Negative.   Respiratory: Positive for shortness of breath. Negative for cough and wheezing.   Cardiovascular: Positive for chest pain. Negative for palpitations and leg swelling.  Gastrointestinal: Negative.   Neurological: Negative.        Objective:   Physical Exam  Constitutional: She is oriented to person, place, and time. No distress.  Obese   Cardiovascular: Normal rate, regular rhythm, normal heart sounds and intact distal pulses.  EKG is normal   Pulmonary/Chest: Effort normal and breath sounds normal. No respiratory distress. She has no wheezes. She has no rales. She exhibits no tenderness.  Abdominal: Soft. Bowel sounds are normal. She exhibits no distension and no mass. There is no tenderness. There is no rebound and no guarding.  Musculoskeletal: She exhibits no edema.  Neurological: She is alert and oriented to person, place, and time.          Assessment & Plan:  Atypical chest pains in a female with a hx of MI. I advised her to go straight from here to [facility] ER for further evaluation, and she agreed.  Alysia Penna, MD

## 2017-02-19 NOTE — Progress Notes (Addendum)
Pt states she has not had her aspirin or restoril and needs tonight. Called pharmacy, pharmacy stated they will adjust

## 2017-02-19 NOTE — ED Notes (Signed)
Hooked patient up to the monitor patient is resting with call bell in reach

## 2017-02-20 ENCOUNTER — Observation Stay (HOSPITAL_BASED_OUTPATIENT_CLINIC_OR_DEPARTMENT_OTHER): Payer: 59

## 2017-02-20 DIAGNOSIS — E785 Hyperlipidemia, unspecified: Secondary | ICD-10-CM | POA: Diagnosis not present

## 2017-02-20 DIAGNOSIS — R0602 Shortness of breath: Secondary | ICD-10-CM | POA: Diagnosis not present

## 2017-02-20 DIAGNOSIS — R079 Chest pain, unspecified: Secondary | ICD-10-CM | POA: Diagnosis not present

## 2017-02-20 DIAGNOSIS — I059 Rheumatic mitral valve disease, unspecified: Secondary | ICD-10-CM | POA: Diagnosis not present

## 2017-02-20 DIAGNOSIS — I10 Essential (primary) hypertension: Secondary | ICD-10-CM | POA: Diagnosis not present

## 2017-02-20 DIAGNOSIS — K219 Gastro-esophageal reflux disease without esophagitis: Secondary | ICD-10-CM | POA: Diagnosis not present

## 2017-02-20 DIAGNOSIS — J449 Chronic obstructive pulmonary disease, unspecified: Secondary | ICD-10-CM | POA: Diagnosis not present

## 2017-02-20 DIAGNOSIS — I2510 Atherosclerotic heart disease of native coronary artery without angina pectoris: Secondary | ICD-10-CM | POA: Diagnosis not present

## 2017-02-20 DIAGNOSIS — R110 Nausea: Secondary | ICD-10-CM | POA: Diagnosis not present

## 2017-02-20 DIAGNOSIS — R0789 Other chest pain: Secondary | ICD-10-CM | POA: Diagnosis not present

## 2017-02-20 LAB — BASIC METABOLIC PANEL
ANION GAP: 12 (ref 5–15)
BUN: 11 mg/dL (ref 6–20)
CO2: 26 mmol/L (ref 22–32)
Calcium: 8.3 mg/dL — ABNORMAL LOW (ref 8.9–10.3)
Chloride: 105 mmol/L (ref 101–111)
Creatinine, Ser: 1.14 mg/dL — ABNORMAL HIGH (ref 0.44–1.00)
GFR calc Af Amer: 60 mL/min — ABNORMAL LOW (ref >60)
GFR calc non Af Amer: 52 mL/min — ABNORMAL LOW (ref >60)
GLUCOSE: 103 mg/dL — AB (ref 65–99)
POTASSIUM: 4.1 mmol/L (ref 3.5–5.1)
Sodium: 143 mmol/L (ref 135–145)

## 2017-02-20 LAB — CBC
HCT: 42.5 % (ref 36.0–46.0)
Hemoglobin: 14.1 g/dL (ref 12.0–15.0)
MCH: 29.7 pg (ref 26.0–34.0)
MCHC: 33.2 g/dL (ref 30.0–36.0)
MCV: 89.5 fL (ref 78.0–100.0)
PLATELETS: 96 10*3/uL — AB (ref 150–400)
RBC: 4.75 MIL/uL (ref 3.87–5.11)
RDW: 13.8 % (ref 11.5–15.5)
WBC: 4.2 10*3/uL (ref 4.0–10.5)

## 2017-02-20 LAB — HIV ANTIBODY (ROUTINE TESTING W REFLEX): HIV Screen 4th Generation wRfx: NONREACTIVE

## 2017-02-20 LAB — LIPID PANEL
Cholesterol: 188 mg/dL (ref 0–200)
HDL: 50 mg/dL (ref >40)
LDL CALC: 87 mg/dL (ref 0–99)
Total CHOL/HDL Ratio: 3.8 RATIO
Triglycerides: 257 mg/dL — ABNORMAL HIGH (ref <150)
VLDL: 51 mg/dL — ABNORMAL HIGH (ref 0–40)

## 2017-02-20 LAB — ECHOCARDIOGRAM COMPLETE
HEIGHTINCHES: 67 in
WEIGHTICAEL: 4889.6 [oz_av]

## 2017-02-20 LAB — TROPONIN I: Troponin I: <0.03 ng/mL (ref <0.03)

## 2017-02-20 MED ORDER — ASPIRIN 81 MG PO TBEC: 81.0000 mg | DELAYED_RELEASE_TABLET | Freq: Every day | ORAL | Status: DC

## 2017-02-20 MED ORDER — PANTOPRAZOLE SODIUM 40 MG PO TBEC: 40.0000 mg | DELAYED_RELEASE_TABLET | Freq: Every day | ORAL | 1 refills | Status: DC

## 2017-02-20 MED FILL — PANTOPRAZOLE SOD DR 40 MG T: 40 | 60 days supply | Qty: 60 | Fill #0

## 2017-02-20 NOTE — Progress Notes (Signed)
Pt discharged via wheelchair with RN

## 2017-02-20 NOTE — Progress Notes (Signed)
Patient slept during the night. Denies chest pain but she stated that she still feels some chest discomfort but nothing like yesterday, per patient.  Complained of headache, Tylenol given and effective. No other complains or concerns. Will continue to monitor.  Lileigh Fahringer, RN

## 2017-02-20 NOTE — Discharge Summary (Signed)
The patient has been seen in conjunction with Kathyrn Drown, NP-C. All aspects of care have been considered and discussed. The patient has been personally interviewed, examined, and all clinical data has been reviewed.   Please see my earlier note.  Agree with discharge summary as noted.   Discharge Summary    Patient ID: Nancy [name],  MRN: [id number], DOB/AGE: [birth year] 62 y.o.  Admit date: 02/19/2017 Discharge date: 02/20/2017  Primary Care Provider: Laurey Morale Primary Cardiologist: Dr. Tamala Julian   Discharge Diagnoses    Active Problems:   Chest pain with moderate risk for cardiac etiology  Allergies Allergies  Allergen Reactions  . Sulfamethoxazole Hives   Diagnostic Studies/Procedures    Echo 02/20/17: Study Conclusions  - Left ventricle: The cavity size was normal. Wall thickness was   increased in a pattern of mild LVH. Systolic function was normal.   The estimated ejection fraction was in the range of 50% to 55%.   Wall motion was normal; there were no regional wall motion   abnormalities. Doppler parameters are consistent with abnormal   left ventricular relaxation (grade 1 diastolic dysfunction).   Doppler parameters are consistent with high ventricular filling   pressure. - Mitral valve: Calcified annulus. _____________   History of Present Illness    Nancy [name] is a 62yo female with a history of non-obstructive CAD, hyperlipidemia, obesity, hypertension, mild COPD, 20-pack-year tobacco history  (quit 11 years ago) and GERD sent for PCP office from evaluation of chest pain on 02/19/17.  On 02/18/17, the pt woke from sleep with substernal chest pressure radiating to her left shoulder beginning approximately 0400 am. She states that the pain was constant in nature, with mild exacerbations of pain at times an associated SOB with heart palpations. She stated that these symptoms were similar to those that she experienced in 2009 when she was evaluated  with a cardiac cath (revealing mild, non-obstructive CAD). Due to her symptoms, she proceeded to her PCP and was subsequently referred to the ED for further evaluation.   Hospital Course     In the ED, her pain was improved to a 5/10 after a GI cocktail, IV morphine, and SL NTG x3. Her POC trop levels were negative, Cr-1.5, Platelets 106. CXR performed without acute cardiopulmonary abnormalities. EKG reveals NSR with HR 68.   She denied regular exercise and works as a Statistician at [employer]. She has no limiting symptoms and has intermittent LEE. Denied orthopnea, PND, syncope, and melena.   Per H&P note, her cardiac history dates back to 2001 when she reports having an heart attack and subsequent catheterization where no blockages were found. It was felt that she had a atypical heart attack in setting of endometrial ablation. She had a pharmacologic stress MPI in 2003 which she reports was negative. She presented to [place] in 06/2007 with chest pain and underwent a cardiac cath which she reports showed 30% blockages. No stents or interventions performed.  Last seen by Dr. Elias Else and Dr. Percival Spanish in 2013 during admission for chest pain. No ischemia found on stress test.No cardiology follow-up since then.  She was started on ASA, statin and ARB. Plan given her greater than 36-hours of chest pressure of mild to moderate intensity with no EKG or biomarker evidence of ischemia/injury was to admit her as an observation stay with continued cycling of enzymes which have remained negative. Repeat EKG's have been unremarkable, NSR with acute ischemic abnormlaities. PPI was intensified for the possibility of reflux.  Continues to have "chest pressure" this AM, although cardiac workup thus far has been completley negative. I anticipate that she will need to be seen again by her PCP and set up for outpatient GI to evaluate worsening GERD. Per MD, he does not feel that this is cardiac pain or associated  with coronary disease.   Consultants: None  General: Well developed, well nourished, NAD Skin: Warm, dry, intact  Head: Normocephalic, atraumatic, clear, moist mucus membranes. Neck: Negative for carotid bruits. No JVD Lungs:Clear to ausculation bilaterally. No wheezes, rales, or rhonchi. Breathing is unlabored. Cardiovascular: RRR with S1 S2. No murmurs, rubs, gallops, or LV heave appreciated. Abdomen: Soft, non-tender, non-distended with normoactive bowel sounds. No obvious abdominal masses. MSK: Strength and tone appear normal for age. 5/5 in all extremities Extremities: No edema. No clubbing or cyanosis. DP/PT pulses 2+ bilaterally Neuro: Alert and oriented. No focal deficits. No facial asymmetry. MAE spontaneously. Psych: Responds to questions appropriately with normal affect.   ____________  Discharge Vitals Blood pressure 128/64, pulse 66, temperature 98.4 F (36.9 C), temperature source Oral, resp. rate 18, height 5' 7"  (1.702 m), weight (!) 305 lb 9.6 oz (138.6 kg), SpO2 96 %.  Filed Weights   02/19/17 1746 02/20/17 0447  Weight: (!) 306 lb 9.6 oz (139.1 kg) (!) 305 lb 9.6 oz (138.6 kg)    Labs & Radiologic Studies    CBC Recent Labs    02/19/17 1832 02/20/17 0616  WBC 5.3 4.2  HGB 14.0 14.1  HCT 41.7 42.5  MCV 89.5 89.5  PLT 96* 96*   Basic Metabolic Panel Recent Labs    02/19/17 1057 02/19/17 1832 02/20/17 0616  NA 141  --  143  K 3.8  --  4.1  CL 104  --  105  CO2 25  --  26  GLUCOSE 90  --  103*  BUN 9  --  11  CREATININE 1.15* 1.27* 1.14*  CALCIUM 8.9  --  8.3*   Liver Function Tests No results for input(s): AST, ALT, ALKPHOS, BILITOT, PROT, ALBUMIN in the last 72 hours. No results for input(s): LIPASE, AMYLASE in the last 72 hours. Cardiac Enzymes Recent Labs    02/19/17 1832 02/19/17 2351 02/20/17 0616  TROPONINI <0.03 <0.03 <0.03   BNP Invalid input(s): POCBNP D-Dimer No results for input(s): DDIMER in the last 72 hours. Hemoglobin  A1C No results for input(s): HGBA1C in the last 72 hours. Fasting Lipid Panel Recent Labs    02/20/17 0616  CHOL 188  HDL 50  LDLCALC 87  TRIG 257*  CHOLHDL 3.8   Thyroid Function Tests No results for input(s): TSH, T4TOTAL, T3FREE, THYROIDAB in the last 72 hours.  Invalid input(s): FREET3 _____________  Dg Chest 2 View  Result Date: 02/19/2017 CLINICAL DATA:  Midline to left sided CP with left arm pain and SOB x yesterday, HTN, past smoker - quit 11 years, pt shielded EXAM: CHEST - 2 VIEW COMPARISON:  06/23/2014 FINDINGS: Lungs are clear. Heart size and mediastinal contours are within normal limits. Aortic Atherosclerosis (ICD10-170.0) No effusion.  No pneumothorax. Visualized bones unremarkable. IMPRESSION: No acute cardiopulmonary disease. Electronically Signed   By: Lucrezia Europe M.D.   On: 02/19/2017 13:17   Disposition   Pt is being discharged home today in good condition.  Follow-up Plans & Appointments    Follow-up Information    Croitoru, Dani Gobble, MD. Go on 03/05/2017.   Specialty:  Cardiology Why:  Your appointment is on 03/05/17 at 0800am with [facility]  Eulas Post, Physician Assistant with Dr. Sallyanne Kuster. Please arrive for your appointment at 0750am. Thank you.  Contact information: [address] [place] [address] [phone]          Discharge Instructions    Call MD for:  persistant nausea and vomiting   Complete by:  As directed    Call MD for:  severe uncontrolled pain   Complete by:  As directed    Call MD for:  temperature >100.4   Complete by:  As directed    Diet - low sodium heart healthy   Complete by:  As directed    Discharge instructions   Complete by:  As directed    Please follow up with your PCP for further evaluation of your symptoms. We have prescribed you a stronger GERD medicine called Protonix to see if this will help you.   Increase activity slowly   Complete by:  As directed      Discharge Medications   Allergies as of  02/20/2017      Reactions   Sulfamethoxazole Hives      Medication List    STOP taking these medications   aspirin 325 MG tablet Replaced by:  aspirin 81 MG EC tablet   omeprazole 40 MG capsule Commonly known as:  PRILOSEC Replaced by:  pantoprazole 40 MG tablet     TAKE these medications   aspirin 81 MG EC tablet Take 1 tablet (81 mg total) by mouth daily. Replaces:  aspirin 325 MG tablet   atorvastatin 20 MG tablet Commonly known as:  LIPITOR TAKE 1 TABLET BY MOUTH EVERY DAY   Black Cohosh 40 MG Caps Take 1 capsule by mouth daily.   CALCIUM PO Take 1,800 mg by mouth at bedtime.   cetirizine 10 MG tablet Commonly known as:  ZYRTEC Take 10 mg by mouth daily.   cholecalciferol 1000 units tablet Commonly known as:  VITAMIN D Take 1,000 Units by mouth daily.   levothyroxine 200 MCG tablet Commonly known as:  SYNTHROID, LEVOTHROID TAKE 1 TABLET BY MOUTH EVERY DAY   losartan 100 MG tablet Commonly known as:  COZAAR TAKE 1 TABLET BY MOUTH EVERY DAY   multivitamin tablet Take 1 tablet by mouth daily.   nitroGLYCERIN 0.4 MG SL tablet Commonly known as:  NITROSTAT Place 1 tablet (0.4 mg total) under the tongue every 5 (five) minutes as needed for chest pain.   pantoprazole 40 MG tablet Commonly known as:  PROTONIX Take 1 tablet (40 mg total) by mouth daily. Start taking on:  02/21/2017 Replaces:  omeprazole 40 MG capsule   temazepam 30 MG capsule Commonly known as:  RESTORIL Take 1 capsule (30 mg total) by mouth at bedtime as needed for sleep. What changed:  when to take this   traMADol 50 MG tablet Commonly known as:  ULTRAM Take 2 tablets (100 mg total) by mouth every 8 (eight) hours as needed for moderate pain.   vitamin E 400 UNIT capsule Take 400 Units by mouth daily.       Outstanding Labs/Studies   Will need to follow up with PCP for further evaluation.   Duration of Discharge Encounter   Greater than 30 minutes including physician  time.  SignedKathyrn Drown NP 02/20/2017, 2:35 PM

## 2017-02-20 NOTE — Progress Notes (Signed)
Pt requesting to have doctor apt with MD Croitoru  Pt has never seen MD Croitoru, NS made appointment with his PA for follow up in 2 weeks.

## 2017-02-20 NOTE — Progress Notes (Signed)
Called 2D echo. Technician stated pt is on list for today, RN informed pt may be discharged. Technician stated they are on their way to the unit

## 2017-02-20 NOTE — Progress Notes (Signed)
Pt discharge education provided at bedside. Pt has all belongings. Pt IV discontinued by nursing student, catheter intact and telemetry removed.

## 2017-02-20 NOTE — Progress Notes (Signed)
PA Mendel Ryder stated okay to discharge pt prior to results of 2D echo

## 2017-02-20 NOTE — Progress Notes (Signed)
Pt daughter, Caryl Pina, requesting to speak to MD  MD Tamala Julian aware  Pt daughter also requesting carafate or PPI at discharge  MD aware

## 2017-02-20 NOTE — Progress Notes (Signed)
  Echocardiogram 2D Echocardiogram has been performed.  Merrie Roof F 02/20/2017, 10:26 AM

## 2017-02-20 NOTE — Progress Notes (Addendum)
   Cardiac markers and EKG are without evidence of ischemia.  Echocardiogram is pending.  My unofficial interpretation is no significant abnormalities to explain the chest discomfort at present.  No significant pericardial effusion or right heart enlargement.  LV function is normal.  Okay to discharge the patient with further workup as an outpatient.  Suspect she will need to have GI workup as discomfort may be esophageal.  Other etiologies are also possible.  Do not believe this has anything to do with coronary disease or cardiac pain.

## 2017-02-21 ENCOUNTER — Telehealth: Payer: Self-pay | Admitting: *Deleted

## 2017-02-21 NOTE — Telephone Encounter (Signed)
Unable to reach patient at time of TCM Call. Left message for patient to return call when available.  

## 2017-02-21 NOTE — Telephone Encounter (Signed)
Transition Care Management Follow-up Telephone Call  Per Discharge Summary: Admit date: 02/19/2017 Discharge date: 02/20/2017  Primary Care Provider: Laurey Morale Primary Cardiologist: Dr. Tamala Julian   Discharge Diagnoses    Active Problems:   Chest pain with moderate risk for cardiac etiology  Allergies     Allergies  Allergen Reactions  . Sulfamethoxazole Hives   --   How have you been since you were released from the hospital? "I'm okay. I'm tired."   Do you understand why you were in the hospital? yes   Do you understand the discharge instructions? yes   Where were you discharged to? Home   Items Reviewed:  Medications reviewed: yes  Allergies reviewed: yes  Dietary changes reviewed: yes  Referrals reviewed: yes, cardiology   Functional Questionnaire:   Activities of Daily Living (ADLs):   She states they are independent in the following: ambulation, bathing and hygiene, feeding, continence, grooming, toileting and dressing States they require assistance with the following: none   Any transportation issues/concerns?: no   Any patient concerns? no   Confirmed importance and date/time of follow-up visits scheduled yes  Provider Appointment booked with Dr. Alysia Penna 02/25/17 @ 10:00am  Confirmed with patient if condition begins to worsen call PCP or go to the ER.  Patient was given the office number and encouraged to call back with question or concerns.  : yes

## 2017-02-25 ENCOUNTER — Ambulatory Visit: Payer: 59 | Admitting: Family Medicine

## 2017-02-25 ENCOUNTER — Encounter: Payer: Self-pay | Admitting: Family Medicine

## 2017-02-25 ENCOUNTER — Encounter: Payer: Self-pay | Admitting: Internal Medicine

## 2017-02-25 VITALS — BP 140/80 | HR 77 | Temp 98.5°F | Wt 311.0 lb

## 2017-02-25 DIAGNOSIS — I10 Essential (primary) hypertension: Secondary | ICD-10-CM

## 2017-02-25 DIAGNOSIS — K219 Gastro-esophageal reflux disease without esophagitis: Secondary | ICD-10-CM

## 2017-02-25 DIAGNOSIS — R079 Chest pain, unspecified: Secondary | ICD-10-CM | POA: Diagnosis not present

## 2017-02-25 MED ORDER — PANTOPRAZOLE SODIUM 40 MG PO TBEC: DELAYED_RELEASE_TABLET | ORAL | 1 refills | Status: DC

## 2017-02-25 MED ORDER — LEVOTHYROXINE SODIUM 200 MCG PO TABS: 200.0000 ug | ORAL_TABLET | Freq: Every day | ORAL | 3 refills | Status: DC

## 2017-02-25 MED ORDER — TEMAZEPAM 30 MG PO CAPS: 30.0000 mg | ORAL_CAPSULE | Freq: Every day | ORAL | 1 refills | Status: DC

## 2017-02-25 MED ORDER — LOSARTAN POTASSIUM 100 MG PO TABS: 100.0000 mg | ORAL_TABLET | Freq: Every day | ORAL | 3 refills | Status: DC

## 2017-02-25 MED ORDER — OMEPRAZOLE 40 MG PO CPDR: DELAYED_RELEASE_CAPSULE | ORAL | 3 refills | Status: DC

## 2017-02-25 MED ORDER — ATORVASTATIN CALCIUM 20 MG PO TABS: 20.0000 mg | ORAL_TABLET | Freq: Every day | ORAL | 3 refills | Status: DC

## 2017-02-25 MED FILL — ATORVASTATIN 20 MG TABLET: 20 | 90 days supply | Qty: 90 | Fill #0

## 2017-02-25 MED FILL — LEVOTHYROXINE 200 MCG TAB: 200 | 90 days supply | Qty: 90 | Fill #0

## 2017-02-25 MED FILL — LOSARTAN POTASSIUM 100 MG T: 100 | 90 days supply | Qty: 90 | Fill #0

## 2017-02-25 MED FILL — TEMAZEPAM 15 MG CAPSULE: 15 | 90 days supply | Qty: 180 | Fill #0

## 2017-02-25 NOTE — Progress Notes (Signed)
   Subjective:    Patient ID: Nancy [name], female    DOB: 18-Sep-1955, 62 y.o.   MRN: [id number]  HPI Here to follow up a hospital stay from 02-19-17 to 02-20-17 for chest pains that radiated down the left arm and SOB. All cardiac enzymes remained normal. A CXR was clear. An ECHO was normal with an EF of 50-55%. On admission her chest pain resolved after receiving a GI cocktail, morphine, and NTG. She was started on Protonix and sent home. Since then she has felt better with no arm pain and no SOB, but she still has a frequent mild substernal pressure. In addition to the Protonix once a day she has also started back on Prilosec once a day (which she had at home from before). No nausea or vomiting. No trouble swallowing.    Review of Systems  Constitutional: Negative.   HENT: Negative.   Eyes: Negative.   Respiratory: Negative.   Cardiovascular: Positive for chest pain. Negative for palpitations and leg swelling.  Gastrointestinal: Negative.        Objective:   Physical Exam  Constitutional: She appears well-developed and well-nourished.  Neck: No thyromegaly present.  Cardiovascular: Normal rate, regular rhythm, normal heart sounds and intact distal pulses.  Pulmonary/Chest: Effort normal and breath sounds normal. No respiratory distress. She has no wheezes. She has no rales. She exhibits no tenderness.  Abdominal: Soft. Bowel sounds are normal. She exhibits no distension and no mass. There is no tenderness. There is no rebound and no guarding.  Lymphadenopathy:    She has no cervical adenopathy.          Assessment & Plan:  Chest pains, probably esophageal in origin. She will stay on twice daily PPI's. Refer to GI for possible endoscopy.  Alysia Penna, MD

## 2017-03-05 ENCOUNTER — Ambulatory Visit: Payer: 59 | Admitting: Physician Assistant

## 2017-03-06 MED FILL — predniSONE 20 MG TABS: 20 | 5 days supply | Qty: 10 | Fill #0

## 2017-03-06 MED FILL — AMOX TR-K CLV 875-125 MG TA: 875-125 | 10 days supply | Qty: 20 | Fill #0

## 2017-03-06 MED FILL — GUAIATUSSIN AC LIQUID: 100-10 | 2 days supply | Qty: 120 | Fill #0

## 2017-03-12 ENCOUNTER — Ambulatory Visit: Payer: 59 | Admitting: Physician Assistant

## 2017-03-12 ENCOUNTER — Encounter: Payer: Self-pay | Admitting: Physician Assistant

## 2017-03-12 VITALS — BP 144/81 | HR 74 | Ht 67.0 in | Wt 314.2 lb

## 2017-03-12 DIAGNOSIS — Z79899 Other long term (current) drug therapy: Secondary | ICD-10-CM | POA: Diagnosis not present

## 2017-03-12 DIAGNOSIS — R079 Chest pain, unspecified: Secondary | ICD-10-CM

## 2017-03-12 DIAGNOSIS — E785 Hyperlipidemia, unspecified: Secondary | ICD-10-CM

## 2017-03-12 DIAGNOSIS — I10 Essential (primary) hypertension: Secondary | ICD-10-CM

## 2017-03-12 MED ORDER — METOPROLOL TARTRATE 25 MG PO TABS: 12.5000 mg | ORAL_TABLET | Freq: Two times a day (BID) | ORAL | 3 refills | Status: DC

## 2017-03-12 MED ORDER — ATORVASTATIN CALCIUM 40 MG PO TABS: 40.0000 mg | ORAL_TABLET | Freq: Every day | ORAL | 3 refills | Status: DC

## 2017-03-12 MED ORDER — NITROGLYCERIN 0.4 MG SL SUBL: 0.4000 mg | SUBLINGUAL_TABLET | SUBLINGUAL | 3 refills | Status: DC | PRN

## 2017-03-12 MED FILL — ATORVASTATIN 40 MG TABLET: 40 | 90 days supply | Qty: 90 | Fill #0

## 2017-03-12 MED FILL — NITROGLYCERIN 0.4 MG TAB SL: 0.4 | 10 days supply | Qty: 25 | Fill #0

## 2017-03-12 MED FILL — METOPROLOL TARTRATE 25 MG T: 25 | 90 days supply | Qty: 90 | Fill #0 | Status: TO

## 2017-03-12 NOTE — Patient Instructions (Signed)
Medication Instructions:  INCREASE- Atorvastatin 40 mg daily START- Metoprolol 12.5 mg twice a day  If you need a refill on your cardiac medications before your next appointment, please call your pharmacy.  Labwork: Fasting Lipid Liver in 6-8 weeks HERE IN OUR OFFICE AT [facility]  Take the provided lab slips for you to take with you to the lab for you blood draw.   You will need to fast. DO NOT EAT OR DRINK PAST MIDNIGHT.   You may go to any [facility] lab that is convenient for you however, we do have a lab in our office that is able to assist you. You do NOT need an appointment for our lab. Once in our office lobby there is a podium to the right of the check-in desk where you are to sign-in and ring a doorbell to alert [place] you are here. Lab is open Monday-Friday from 8:00am to 4:00pm; and is closed for lunch from 12:45p-1:45pm   Testing/Procedures: None Ordered  Follow-Up: Your physician wants you to follow-up in: 2-3 Months with Dr Sallyanne Kuster.    Thank you for choosing [facility] at [facility]!!

## 2017-03-12 NOTE — Progress Notes (Signed)
Cardiology Office Note    Date:  03/14/2017   ID:  Nancy [name], DOB 1955/09/11, MRN [id number]  PCP:  Laurey Morale, MD  Cardiologist:  Dr. Sallyanne Kuster (seen by Dr. Tamala Julian during recent admission)  Chief Complaint  Patient presents with  . Follow-up    plan to establish with Dr. Sallyanne Kuster (seen by Dr. Tamala Julian recently)    History of Present Illness:  Nancy [name] is a 62 y.o. female with PMH of CAD, HLD, obesity, HTN, mild COPD, and former tobacco abuse. She had a cardiac catheterization in 2001 which showed clean coronaries. It was felt patient had atypical chest pain in the setting of endometrial ablation. Last cath in [place] in 06/2007 showed 30% blockage in unknown artery. Last 2 day myoview in 2013 was negative. Echo in Jan 2019 showed normal EF 50-55%, mild LVH.  Patient was subsequently discharged.  According to the discharge note, Dr. Tamala Julian felt this likely is noncardiac chest pain.  He recommended increasing GERD medication and potentially have GI workup.  Patient was discharged on low-dose aspirin and Protonix to replace her previous Prilosec.  She also has outpatient follow-up evaluation by Dr. Henrene Pastor as well.  Since her discharge, she has been seen by her PCP who recommended her to take both Protonix and Prilosec.  Patient presents today for cardiology office visit.  She still have occasional chest pain, however after additional Protonix, she felt the frequency and duration is decreasing.  The last episode of chest pain was 5 days ago.  She says it only last a few minutes at the time.  She described its characteristic as a left-sided sharp pressure.  It does not radiate.  It does not seem to occur with exertion.  She can climb up and down the stairs in the house without any issue.  Given the atypical feature, I will hold off on further ischemic workup unless symptom worsens.  I will however add a low-dose metoprolol tartrate 12.5 mg twice daily for blood pressure control and  also antianginal purposes.  I will refill her sublingual nitroglycerin.  Most recent fasting lipid panel showed uncontrolled hypertriglyceridemia and a mildly elevated LDL, I will increase Lipitor to 40 mg daily.  She will need a fasting lipid panel in 6-8 weeks.  Also note, patient wished to establish with Dr. Sallyanne Kuster who she learned from her daughter instead of follow-up with Dr. Tamala Julian who saw her in the hospital.  She appreciated Dr. Tamala Julian for his care during this hospitalization however wish to establish with Dr. Sallyanne Kuster at her daughter's recommendation.   Past Medical History:  Diagnosis Date  . Allergic rhinitis    gets shots per Dr. Harold Hedge   . Anemia 2001  . Anginal pain ([place])   . COPD (chronic obstructive pulmonary disease) ([place])    "CXR just showed mild COPD" (10/24/2011)  . Coronary artery disease    had MI in 2001, see[facility] Fletcher [facility]  . GERD (gastroesophageal reflux disease)   . Gynecological examination    sees Dr. Cherylann Banas   . History of cardiac catheterization 07-24-07   showed nonconclusive disease  . Hyperlipidemia   . Hypertension   . Hypothyroidism   . Migraines    "have a history of migraines; haven't had one for years" (10/24/2011)  . Myocardial infarction [facility]) 2001?    Past Surgical History:  Procedure Laterality Date  . CARDIAC CATHETERIZATION  2009  . CESAREAN SECTION  1984  . COLONOSCOPY  10-06-08  per Dr. Henrene Pastor, benign polyps, repeat in 10 yrs   . HERNIA REPAIR  ~ 2007   ventral hernia repair  . THYROIDECTOMY  1990's  . TONSILLECTOMY     "when I was a kid"    Current Medications: Outpatient Medications Prior to Visit  Medication Sig Dispense Refill  . aspirin EC 81 MG EC tablet Take 1 tablet (81 mg total) by mouth daily. (Patient taking differently: Take 325 mg by mouth daily. )    . Black Cohosh 40 MG CAPS Take 1 capsule by mouth daily.      Marland Kitchen CALCIUM PO Take 1,800 mg by mouth at bedtime.    . cetirizine (ZYRTEC) 10 MG  tablet Take 10 mg by mouth daily.    . cholecalciferol (VITAMIN D) 1000 units tablet Take 1,000 Units by mouth daily.    Marland Kitchen levothyroxine (SYNTHROID, LEVOTHROID) 200 MCG tablet Take 1 tablet (200 mcg total) by mouth daily. 90 tablet 3  . losartan (COZAAR) 100 MG tablet Take 1 tablet (100 mg total) by mouth daily. 90 tablet 3  . Multiple Vitamin (MULTIVITAMIN) tablet Take 1 tablet by mouth daily.      Marland Kitchen omeprazole (PRILOSEC) 40 MG capsule Take one every evening 30 capsule 3  . pantoprazole (PROTONIX) 40 MG tablet Take one every morning 60 tablet 1  . temazepam (RESTORIL) 30 MG capsule Take 1 capsule (30 mg total) by mouth at bedtime. 90 capsule 1  . traMADol (ULTRAM) 50 MG tablet Take 2 tablets (100 mg total) by mouth every 8 (eight) hours as needed for moderate pain. 540 tablet 1  . vitamin E (VITAMIN E) 400 UNIT capsule Take 400 Units by mouth daily.      Marland Kitchen atorvastatin (LIPITOR) 20 MG tablet Take 1 tablet (20 mg total) by mouth daily. 90 tablet 3  . nitroGLYCERIN (NITROSTAT) 0.4 MG SL tablet Place 1 tablet (0.4 mg total) under the tongue every 5 (five) minutes as needed for chest pain. 30 tablet 11   No facility-administered medications prior to visit.      Allergies:   Sulfamethoxazole   Social History   Socioeconomic History  . Marital status: Single    Spouse name: None  . Number of children: None  . Years of education: None  . Highest education level: None  Social Needs  . Financial resource strain: None  . Food insecurity - worry: None  . Food insecurity - inability: None  . Transportation needs - medical: None  . Transportation needs - non-medical: None  Occupational History  . None  Tobacco Use  . Smoking status: Former Smoker    Packs/day: 1.00    Years: 30.00    Pack years: 30.00    Types: Cigarettes    Last attempt to quit: 10/24/2006    Years since quitting: 10.3  . Smokeless tobacco: Never Used  Substance and Sexual Activity  . Alcohol use: No    Alcohol/week:  0.0 oz  . Drug use: No  . Sexual activity: No  Other Topics Concern  . None  Social History Narrative  . None     Family History:  The patient's family history includes Alcohol abuse in her other; Breast cancer in her mother; Cancer in her other; Coronary artery disease in her father; Heart disease in her other; Hyperlipidemia in her other; Hypertension in her father and other; Pulmonary fibrosis in her mother; Stroke in her other.   ROS:   Please see the history of present illness.  ROS All other systems reviewed and are negative.   PHYSICAL EXAM:   VS:  BP (!) 144/81   Pulse 74   Ht 5' 7"  (1.702 m)   Wt (!) 314 lb 3.2 oz (142.5 kg)   BMI 49.21 kg/m    GEN: Well nourished, well developed, in no acute distress  HEENT: normal  Neck: no JVD, carotid bruits, or masses Cardiac: RRR; no murmurs, rubs, or gallops,no edema  Respiratory:  clear to auscultation bilaterally, normal work of breathing GI: soft, nontender, nondistended, + BS MS: no deformity or atrophy  Skin: warm and dry, no rash Neuro:  Alert and Oriented x 3, Strength and sensation are intact Psych: euthymic mood, full affect  Wt Readings from Last 3 Encounters:  03/12/17 (!) 314 lb 3.2 oz (142.5 kg)  02/25/17 (!) 311 lb (141.1 kg)  02/20/17 (!) 305 lb 9.6 oz (138.6 kg)      Studies/Labs Reviewed:   EKG:  EKG is not ordered today.    Recent Labs: 02/19/2017: B Natriuretic Peptide 29.5 02/20/2017: BUN 11; Creatinine, Ser 1.14; Hemoglobin 14.1; Platelets 96; Potassium 4.1; Sodium 143   Lipid Panel    Component Value Date/Time   CHOL 188 02/20/2017 0616   TRIG 257 (H) 02/20/2017 0616   HDL 50 02/20/2017 0616   CHOLHDL 3.8 02/20/2017 0616   VLDL 51 (H) 02/20/2017 0616   LDLCALC 87 02/20/2017 0616    Additional studies/ records that were reviewed today include:   Echo 02/20/2017 LV EF: 50% -   55%  ------------------------------------------------------------------- Study Conclusions  - Left  ventricle: The cavity size was normal. Wall thickness was   increased in a pattern of mild LVH. Systolic function was normal.   The estimated ejection fraction was in the range of 50% to 55%.   Wall motion was normal; there were no regional wall motion   abnormalities. Doppler parameters are consistent with abnormal   left ventricular relaxation (grade 1 diastolic dysfunction).   Doppler parameters are consistent with high ventricular filling   pressure. - Mitral valve: Calcified annulus.  Impressions:  - Normal LV systolic function; mild diastolic dysfunction; mild   LVH.     ASSESSMENT:    1. Chest pain, unspecified type   2. Hyperlipidemia, unspecified hyperlipidemia type   3. Medication management   4. Essential hypertension      PLAN:  In order of problems listed above:  1. Chest pain: Continue to have intermittent chest pain after discharge, however Protonix seems to have admitted slightly better.  She is going to see Dr. Henrene Pastor of GI.  Otherwise I will add low-dose beta-blocker and refill her sublingual nitroglycerin.  The last episode of chest pain was about 5 days ago, if she were to develop more frequent chest pain or chest pain associated with exertion, I would have low threshold to recommend stress test.  She wished to establish with Dr. Sallyanne Kuster at her daughter's recommendation instead of following with Dr. Tamala Julian.  2. Hypertension: Blood pressure mildly elevated today.  Adding low-dose metoprolol 12.5 mg twice daily  3. Hyperlipidemia: Recent lipid uncontrolled, triglyceride was 257, LDL is 87, LDL goal less than 70.  I will increase Lipitor to 40 mg daily.  Fasting lipid panel and LFT in 6-8 weeks.    Medication Adjustments/Labs and Tests Ordered: Current medicines are reviewed at length with the patient today.  Concerns regarding medicines are outlined above.  Medication changes, Labs and Tests ordered today are listed in the Patient Instructions  below. Patient Instructions  Medication Instructions:  INCREASE- Atorvastatin 40 mg daily START- Metoprolol 12.5 mg twice a day  If you need a refill on your cardiac medications before your next appointment, please call your pharmacy.  Labwork: Fasting Lipid Liver in 6-8 weeks HERE IN OUR OFFICE AT [facility]  Take the provided lab slips for you to take with you to the lab for you blood draw.   You will need to fast. DO NOT EAT OR DRINK PAST MIDNIGHT.   You may go to any [facility] lab that is convenient for you however, we do have a lab in our office that is able to assist you. You do NOT need an appointment for our lab. Once in our office lobby there is a podium to the right of the check-in desk where you are to sign-in and ring a doorbell to alert [place] you are here. Lab is open Monday-Friday from 8:00am to 4:00pm; and is closed for lunch from 12:45p-1:45pm   Testing/Procedures: None Ordered  Follow-Up: Your physician wants you to follow-up in: 2-3 Months with Dr Sallyanne Kuster.    Thank you for choosing [facility] at [employer], [place]  03/14/2017 6:06 AM    [address], [place], [place]  [address] Phone: [phone]; Fax: [phone]

## 2017-03-14 ENCOUNTER — Encounter: Payer: Self-pay | Admitting: Physician Assistant

## 2017-03-17 NOTE — Progress Notes (Signed)
Thanks MCr 

## 2017-04-08 ENCOUNTER — Encounter: Payer: Self-pay | Admitting: Family Medicine

## 2017-04-08 ENCOUNTER — Ambulatory Visit: Payer: 59 | Admitting: Family Medicine

## 2017-04-08 VITALS — BP 156/70 | HR 70 | Temp 98.8°F | Wt 309.4 lb

## 2017-04-08 DIAGNOSIS — J209 Acute bronchitis, unspecified: Secondary | ICD-10-CM | POA: Diagnosis not present

## 2017-04-08 MED ORDER — ALBUTEROL SULFATE HFA 108 (90 BASE) MCG/ACT IN AERS: 2.0000 | INHALATION_SPRAY | RESPIRATORY_TRACT | 5 refills | Status: DC | PRN

## 2017-04-08 MED ORDER — LEVOFLOXACIN 500 MG PO TABS: 500.0000 mg | ORAL_TABLET | Freq: Every day | ORAL | 0 refills | Status: AC

## 2017-04-08 MED FILL — levoFLOXacin 500 MG TABS: 500 | 10 days supply | Qty: 10 | Fill #0

## 2017-04-08 MED FILL — VENTOLIN HFA 90 MCG INHALER: 108 (90 BAS | 17 days supply | Qty: 18 | Fill #0

## 2017-04-08 NOTE — Progress Notes (Signed)
   Subjective:    Patient ID: [name], female    DOB: 1955/08/02, 62 y.o.   MRN: [id number]  HPI Here for 2 weeks of chest tightness and coughing up yellow sputum. No fever. She saw a nurse at her work clinic last week and she gave her Augmentin and an oral taper of prednisone. This has not helped very much.    Review of Systems  Constitutional: Negative.   HENT: Negative.   Eyes: Negative.   Respiratory: Positive for cough, chest tightness, shortness of breath and wheezing.   Cardiovascular: Negative.        Objective:   Physical Exam  Constitutional: She appears well-developed and well-nourished.  HENT:  Right Ear: External ear normal.  Left Ear: External ear normal.  Nose: Nose normal.  Mouth/Throat: Oropharynx is clear and moist.  Eyes: Conjunctivae are normal.  Neck: No thyromegaly present.  Pulmonary/Chest: Effort normal. No respiratory distress. She has no wheezes. She has no rales.  Scattered rhonchi   Lymphadenopathy:    She has no cervical adenopathy.          Assessment & Plan:  Bronchitis, treat with Levaquin. Refilled her Proair inhaler to use prn.  Alysia Penna, MD

## 2017-04-16 MED FILL — PANTOPRAZOLE SOD DR 40 MG T: 40 | 60 days supply | Qty: 60 | Fill #1

## 2017-04-24 ENCOUNTER — Ambulatory Visit: Payer: 59 | Admitting: Internal Medicine

## 2017-04-24 ENCOUNTER — Encounter: Payer: Self-pay | Admitting: Internal Medicine

## 2017-04-24 VITALS — BP 130/70 | HR 76 | Ht 67.0 in | Wt 311.0 lb

## 2017-04-24 DIAGNOSIS — R079 Chest pain, unspecified: Secondary | ICD-10-CM | POA: Diagnosis not present

## 2017-04-24 DIAGNOSIS — K219 Gastro-esophageal reflux disease without esophagitis: Secondary | ICD-10-CM | POA: Diagnosis not present

## 2017-04-24 MED ORDER — PANTOPRAZOLE SODIUM 40 MG PO TBEC: 40.0000 mg | DELAYED_RELEASE_TABLET | Freq: Two times a day (BID) | ORAL | 6 refills | Status: DC

## 2017-04-24 NOTE — Patient Instructions (Signed)
We have sent the following medications to your pharmacy for you to pick up at your convenience:  Pantoprazole  You have been scheduled for an abdominal ultrasound at [facility] (1st floor of hospital) on 04/28/2017 at 7:00am. Please arrive 15 minutes prior to your appointment for registration. Make certain not to have anything to eat or drink after midnight prior to your appointment. Should you need to reschedule your appointment, please contact radiology at [phone]. This test typically takes about 30 minutes to perform.  You have been scheduled for an endoscopy. Please follow written instructions given to you at your visit today. If you use inhalers (even only as needed), please bring them with you on the day of your procedure. Your physician has requested that you go to www.startemmi.com and enter the access code given to you at your visit today. This web site gives a general overview about your procedure. However, you should still follow specific instructions given to you by our office regarding your preparation for the procedure.

## 2017-04-28 ENCOUNTER — Ambulatory Visit (HOSPITAL_COMMUNITY): Payer: 59

## 2017-04-29 ENCOUNTER — Encounter: Payer: Self-pay | Admitting: Internal Medicine

## 2017-04-29 DIAGNOSIS — E785 Hyperlipidemia, unspecified: Secondary | ICD-10-CM | POA: Diagnosis not present

## 2017-04-29 DIAGNOSIS — Z79899 Other long term (current) drug therapy: Secondary | ICD-10-CM | POA: Diagnosis not present

## 2017-04-29 DIAGNOSIS — I10 Essential (primary) hypertension: Secondary | ICD-10-CM | POA: Diagnosis not present

## 2017-04-29 LAB — HEPATIC FUNCTION PANEL
ALT: 20 IU/L (ref 0–32)
AST: 15 IU/L (ref 0–40)
Albumin: 4.3 g/dL (ref 3.5–5.5)
Alkaline Phosphatase: 72 IU/L (ref 39–117)
BILIRUBIN TOTAL: 0.3 mg/dL (ref 0.0–1.2)
BILIRUBIN, DIRECT: 0.12 mg/dL (ref 0.00–0.40)
Total Protein: 6.5 g/dL (ref 6.0–8.5)

## 2017-04-29 LAB — LIPID PANEL
CHOLESTEROL TOTAL: 189 mg/dL (ref 100–199)
Chol/HDL Ratio: 3.4 ratio (ref 0.0–4.4)
HDL: 56 mg/dL (ref >39)
LDL CALC: 90 mg/dL (ref 0–99)
TRIGLYCERIDES: 213 mg/dL — AB (ref 0–149)
VLDL Cholesterol Cal: 43 mg/dL — ABNORMAL HIGH (ref 5–40)

## 2017-04-29 NOTE — Progress Notes (Signed)
HISTORY OF PRESENT ILLNESS:  Nancy [name] is a 62 y.o. female , respiratory therapist at [employer] past medical history as listed below who is referred by her primary care provider Dr. Sarajane Jews with chief complaint of chest pain, possibly GERD. Patient has not been seen in this office since 2010 when she underwent routine screening colonoscopy which revealed non-adenomatous colon polyps and moderate diverticulosis. Follow-up in 10 years recommended. Patient tells me that she has a remote history of MI. She tells me that she has had intermittent problems with chest pain for years. She describes substernal heaviness with radiation to left arm. This can last 10-15 minutes. Typically occurs once per week. She was hospitalized in January with negative workup. Seen by cardiology thereafter. Felt to be noncardiac. GI evaluation encouraged. She describes a substernal hurting sensation at times. No dysphagia. Symptoms are not exacerbated by meals. She does express reflux symptoms at night. She had been on omeprazole 40 mg daily. Pantoprazole added later in the day. Symptoms improved on twice a day PPI compared to once daily  REVIEW OF SYSTEMS:  All non-GI ROS negative unless otherwise stated in the history of present illness except for sinus allergies  Past Medical History:  Diagnosis Date  . Allergic rhinitis    gets shots per Dr. Harold Hedge   . Anemia 2001  . Anginal pain ([place])   . COPD (chronic obstructive pulmonary disease) ([place])    "CXR just showed mild COPD" (10/24/2011)  . Coronary artery disease    had MI in 2001, see[facility] Fletcher [facility] at [facility]  . GERD (gastroesophageal reflux disease)   . Gynecological examination    sees Dr. Cherylann Banas   . History of cardiac catheterization 07-24-07   showed nonconclusive disease  . Hyperlipidemia   . Hypertension   . Hypothyroidism   . Migraines    "have a history of migraines; haven't had one for years" (10/24/2011)  . Myocardial infarction [facility])  2001?    Past Surgical History:  Procedure Laterality Date  . CARDIAC CATHETERIZATION  2009  . CESAREAN SECTION  1984  . COLONOSCOPY  10-06-08   per Dr. Henrene Pastor, benign polyps, repeat in 10 yrs   . HERNIA REPAIR  ~ 2007   ventral hernia repair  . THYROIDECTOMY  1990's  . TONSILLECTOMY     "when I was a kid"    Social History Mykelti Goldenstein  reports that she quit smoking about 10 years ago. Her smoking use included cigarettes. She has a 30.00 pack-year smoking history. She has never used smokeless tobacco. She reports that she does not drink alcohol or use drugs.  family history includes Alcohol abuse in her other; Breast cancer in her mother; Cancer in her other; Coronary artery disease in her father; Heart disease in her other; Hyperlipidemia in her other; Hypertension in her father and other; Pulmonary fibrosis in her mother; Stroke in her other.  Allergies  Allergen Reactions  . Sulfamethoxazole Hives       PHYSICAL EXAMINATION: Vital signs: BP 130/70   Pulse 76   Ht 5' 7"  (1.702 m)   Wt (!) 311 lb (141.1 kg)   BMI 48.71 kg/m   Constitutional: generally well-appearing, no acute distress Psychiatric: alert and oriented x3, cooperative Eyes: extraocular movements intact, anicteric, conjunctiva pink Mouth: oral pharynx moist, no lesions Neck: supple no lymphadenopathy Cardiovascular: heart regular rate and rhythm, no murmur Lungs: clear to auscultation bilaterally Abdomen: soft,obese, nontender, nondistended, no obvious ascites, no peritoneal signs, normal bowel sounds,  no organomegaly Rectal:omitted Extremities: no clubbing, cyanosis, or lower extremity edema bilaterally Skin: no lesions on visible extremities Neuro: No focal deficits. Cranial nerves intact  ASSESSMENT:  #1. Atypical chest pain #2. GERD. Active symptoms despite PPI once daily. Better with twice a day dosage #3. Obesity   PLAN:  #1. Reflux precautions #2. Weight loss #3. Pantoprazole 40 mg twice  daily. Prescribed #4. Schedule upper endoscopy to evaluate chronic GERD and chest pain.The nature of the procedure, as well as the risks, benefits, and alternatives were carefully and thoroughly reviewed with the patient. Ample time for discussion and questions allowed. The patient understood, was satisfied, and agreed to proceed. #5. Schedule abdominal ultrasound to evaluate chest pain. Rule out gallstones  A copy of this consultation and has been sent to Dr. Sarajane Jews

## 2017-05-06 ENCOUNTER — Other Ambulatory Visit: Payer: Self-pay

## 2017-05-06 MED ORDER — FISH OIL 1200 MG PO CAPS: 1200.0000 mg | ORAL_CAPSULE | Freq: Every day | ORAL | 0 refills | Status: DC

## 2017-05-06 MED ORDER — ATORVASTATIN CALCIUM 80 MG PO TABS: 80.0000 mg | ORAL_TABLET | Freq: Every day | ORAL | 0 refills | Status: DC

## 2017-05-14 ENCOUNTER — Ambulatory Visit (HOSPITAL_COMMUNITY)
Admission: RE | Admit: 2017-05-14 | Discharge: 2017-05-14 | Disposition: A | Discharge status: Home / Self Care | Payer: 59 | Source: Ambulatory Visit | Attending: Internal Medicine | Admitting: Internal Medicine

## 2017-05-14 DIAGNOSIS — K219 Gastro-esophageal reflux disease without esophagitis: Secondary | ICD-10-CM | POA: Insufficient documentation

## 2017-05-14 DIAGNOSIS — R932 Abnormal findings on diagnostic imaging of liver and biliary tract: Secondary | ICD-10-CM | POA: Insufficient documentation

## 2017-05-14 DIAGNOSIS — R079 Chest pain, unspecified: Secondary | ICD-10-CM | POA: Insufficient documentation

## 2017-05-14 DIAGNOSIS — R161 Splenomegaly, not elsewhere classified: Secondary | ICD-10-CM | POA: Insufficient documentation

## 2017-05-15 ENCOUNTER — Encounter: Payer: Self-pay | Admitting: Cardiovascular Disease

## 2017-05-15 ENCOUNTER — Ambulatory Visit: Payer: 59 | Admitting: Cardiovascular Disease

## 2017-05-15 ENCOUNTER — Encounter: Payer: Self-pay | Admitting: Internal Medicine

## 2017-05-15 VITALS — BP 132/70 | HR 63 | Ht 67.0 in | Wt 319.0 lb

## 2017-05-15 DIAGNOSIS — I10 Essential (primary) hypertension: Secondary | ICD-10-CM | POA: Diagnosis not present

## 2017-05-15 DIAGNOSIS — I2510 Atherosclerotic heart disease of native coronary artery without angina pectoris: Secondary | ICD-10-CM | POA: Diagnosis not present

## 2017-05-15 DIAGNOSIS — E7800 Pure hypercholesterolemia, unspecified: Secondary | ICD-10-CM | POA: Diagnosis not present

## 2017-05-15 DIAGNOSIS — E669 Obesity, unspecified: Secondary | ICD-10-CM

## 2017-05-15 NOTE — Patient Instructions (Signed)
Dr Croitoru recommends that you schedule a follow-up appointment in 12 months. You will receive a reminder letter in the mail two months in advance. If you don't receive a letter, please call our office to schedule the follow-up appointment.  If you need a refill on your cardiac medications before your next appointment, please call your pharmacy. 

## 2017-05-15 NOTE — Progress Notes (Signed)
Cardiology Office Note:    Date:  05/16/2017   ID:  Nancy [name], DOB [birth year], MRN [id number]  PCP:  Nancy Morale, MD  Cardiologist:  No primary care provider on file.   Referring MD: Nancy Morale, MD   Chief Complaint  Patient presents with  . Chest Pain  . Hyperlipidemia    History of Present Illness:    Nancy [name] is a 62 y.o. female with a hx of "myocardial infarction" with clean coronaries 20 years ago and recurrent episodes of chest pain over the years, with negative cardiac workup, symptoms typically resolving with interventions for GERD.  Last cath in [place] in 06/2007 showed 30% blockage in unknown artery. Last 2 day myoview in 2013 was negative. Echo in Jan 2019 showed normal EF 50-55%, mild LVH.  Symptoms better after protonix, Scheduled for EGD May 29, 2017.  She is super obese ("I have never weighed so much in my life") and has a typical insulin-resistant/metabolic syndrome lipid profile. Despite high dose atorvastatin, her LDL is in the 80s, TG are mildly elevated. HDL is not bad in the 50s. She is interested in a weight loss clinical trial of Liraglutide/Saxenda versus Victoza.  Retrospectively, I suspect she had Takotsubo sd or demand ischemia when she had her "MI". She had a lot of menorrhagia and severe anemia at the time.  Past Medical History:  Diagnosis Date  . Allergic rhinitis    gets shots per Dr. Harold [name]   . Anemia 2001  . Anginal pain (Nancy [name])   . COPD (chronic obstructive pulmonary disease) ([place])    "CXR just showed mild COPD" (10/24/2011)  . Coronary artery disease    had MI in 2001, seees Dr. Fletcher [name] at [facility]  . GERD (gastroesophageal reflux disease)   . Gynecological examination    sees Dr. Cherylann [name]   . History of cardiac catheterization 07-24-07   showed nonconclusive disease  . Hyperlipidemia   . Hypertension   . Hypothyroidism   . Migraines    "have a history of migraines; haven't had one for years"  (10/24/2011)  . Myocardial infarction [facility]) 2001?    Past Surgical History:  Procedure Laterality Date  . CARDIAC CATHETERIZATION  2009  . CESAREAN SECTION  1984  . COLONOSCOPY  10-06-08   per Dr. Henrene [name], benign polyps, repeat in 10 yrs   . HERNIA REPAIR  ~ 2007   ventral hernia repair  . THYROIDECTOMY  1990's  . TONSILLECTOMY     "when I was a kid"    Current Medications: Current Meds  Medication Sig  . albuterol (PROVENTIL HFA;VENTOLIN HFA) 108 (90 Base) MCG/ACT inhaler Inhale 2 puffs into the lungs every 4 (four) hours as needed for wheezing or shortness of breath.  Marland Kitchen aspirin 325 MG tablet Take 325 mg by mouth daily.  Marland Kitchen atorvastatin (LIPITOR) 80 MG tablet Take 1 tablet (80 mg total) by mouth daily.  . Black Cohosh 40 MG CAPS Take 1 capsule by mouth daily.    Marland Kitchen CALCIUM PO Take 1,800 mg by mouth at bedtime.  . cetirizine (ZYRTEC) 10 MG tablet Take 10 mg by mouth daily.  . cholecalciferol (VITAMIN D) 1000 units tablet Take 1,000 Units by mouth daily.  Marland Kitchen KRILL OIL PO Take 1 capsule by mouth daily.  Marland Kitchen levothyroxine (SYNTHROID, LEVOTHROID) 200 MCG tablet Take 1 tablet (200 mcg total) by mouth daily.  Marland Kitchen losartan (COZAAR) 100 MG tablet Take 1 tablet (100 mg total) by mouth daily.  Marland Kitchen  metoprolol tartrate (LOPRESSOR) 25 MG tablet Take 0.5 tablets (12.5 mg total) by mouth 2 (two) times daily.  . Multiple Vitamin (MULTIVITAMIN) tablet Take 1 tablet by mouth daily.    . nitroGLYCERIN (NITROSTAT) 0.4 MG SL tablet Place 1 tablet (0.4 mg total) under the tongue every 5 (five) minutes as needed for chest pain.  Marland Kitchen omeprazole (PRILOSEC) 40 MG capsule Take one every evening  . pantoprazole (PROTONIX) 40 MG tablet Take 1 tablet (40 mg total) by mouth 2 (two) times daily. Take one every morning  . temazepam (RESTORIL) 30 MG capsule Take 1 capsule (30 mg total) by mouth at bedtime.  . traMADol (ULTRAM) 50 MG tablet Take 2 tablets (100 mg total) by mouth every 8 (eight) hours as needed for moderate pain.    . vitamin E (VITAMIN E) 400 UNIT capsule Take 400 Units by mouth daily.       Allergies:   Sulfamethoxazole   Social History   Socioeconomic History  . Marital status: Divorced    Spouse name: Not on file  . Number of children: 1  . Years of education: Not on file  . Highest education level: Not on file  Occupational History  . Occupation: respiratory therapist  Social Needs  . Financial resource strain: Not on file  . Food insecurity:    Worry: Not on file    Inability: Not on file  . Transportation needs:    Medical: Not on file    Non-medical: Not on file  Tobacco Use  . Smoking status: Former Smoker    Packs/day: 1.00    Years: 30.00    Pack years: 30.00    Types: Cigarettes    Last attempt to quit: 10/24/2006    Years since quitting: 10.5  . Smokeless tobacco: Never Used  Substance and Sexual Activity  . Alcohol use: No    Alcohol/week: 0.0 oz  . Drug use: No  . Sexual activity: Never  Lifestyle  . Physical activity:    Days per week: Not on file    Minutes per session: Not on file  . Stress: Not on file  Relationships  . Social connections:    Talks on phone: Not on file    Gets together: Not on file    Attends religious service: Not on file    Active member of club or organization: Not on file    Attends meetings of clubs or organizations: Not on file    Relationship status: Not on file  Other Topics Concern  . Not on file  Social History Narrative  . Not on file     Family History: The patient's family history includes Alcohol abuse in her other; Breast cancer in her mother; Cancer in her other; Coronary artery disease in her father; Heart disease in her other; Hyperlipidemia in her other; Hypertension in her father and other; Pulmonary fibrosis in her mother; Stroke in her other.  ROS:   Please see the history of present illness.     All other systems reviewed and are negative.  EKGs/Labs/Other Studies Reviewed:    The following studies were  reviewed today:   EKG:  EKG is  ordered today.  The ekg ordered 02/20/2017 demonstrates normal sinus rhythm, poor anterior R wave progression, left axis deviation.  Looks very much to have left anterior fascicular block, not quite meeting the -45 degree axis criteria.  Recent Labs: 02/19/2017: B Natriuretic Peptide 29.5 02/20/2017: BUN 11; Creatinine, Ser 1.14; Hemoglobin 14.1; Platelets 96;  Potassium 4.1; Sodium 143 04/29/2017: ALT 20  Recent Lipid Panel    Component Value Date/Time   CHOL 189 04/29/2017 0824   TRIG 213 (H) 04/29/2017 0824   HDL 56 04/29/2017 0824   CHOLHDL 3.4 04/29/2017 0824   CHOLHDL 3.8 02/20/2017 0616   VLDL 51 (H) 02/20/2017 0616   LDLCALC 90 04/29/2017 0824    Physical Exam:    VS:  BP 132/70   Pulse 63   Ht 5' 7"  (1.702 m)   Wt (!) 319 lb (144.7 kg)   BMI 49.96 kg/m     Wt Readings from Last 3 Encounters:  05/15/17 (!) 319 lb (144.7 kg)  04/24/17 (!) 311 lb (141.1 kg)  04/08/17 (!) 309 lb 6.4 oz (140.3 kg)     GEN: Super obese, well nourished, well developed in no acute distress HEENT: Normal NECK: No JVD; No carotid bruits LYMPHATICS: No lymphadenopathy CARDIAC: Limited exam due to obesity, RRR, no murmurs, rubs, gallops RESPIRATORY:  Clear to auscultation without rales, wheezing or rhonchi  ABDOMEN: Soft, non-tender, non-distended MUSCULOSKELETAL:  No edema; No deformity  SKIN: Warm and dry NEUROLOGIC:  Alert and oriented x 3 PSYCHIATRIC:  Normal affect   ASSESSMENT:    1. Atherosclerosis of native coronary artery of native heart without angina pectoris   2. Hypercholesterolemia   3. Essential hypertension   4. Super obesity    PLAN:    In order of problems listed above:  1. Minor CAD: Previous chest pain symptoms seem to be consistently related to GERD focus on risk factor modification.  She is on a good dose of the highly active statin.  Her blood pressure is well controlled.  She needs to be more physically active.  I do not think  she ever had a myocardial infarction.  It is very difficult to say retrospectively, but she may have had stress cardiomyopathy.  She does have known mild atherosclerotic coronary plaque and risk factor modification is critical to avoid future problems.   2. HLP: at target LDL , <100. 3. HTN: Well-controlled. 4. Superobesity: would benefit from aggressive attempts at weight loss.  She is really trying to change her diet.  Discussed the concept of glycemic index.  Exercise should be an integral part of attempts at weight loss.   Medication Adjustments/Labs and Tests Ordered: Current medicines are reviewed at length with the patient today.  Concerns regarding medicines are outlined above.  No orders of the defined types were placed in this encounter.  No orders of the defined types were placed in this encounter.   Patient Instructions  Dr Sallyanne Kuster recommends that you schedule a follow-up appointment in 12 months. You will receive a reminder letter in the mail two months in advance. If you don't receive a letter, please call our office to schedule the follow-up appointment.  If you need a refill on your cardiac medications before your next appointment, please call your pharmacy.    Signed, Sanda Klein, MD  05/16/2017 5:27 PM    [facility]

## 2017-05-16 DIAGNOSIS — E669 Obesity, unspecified: Secondary | ICD-10-CM | POA: Insufficient documentation

## 2017-05-20 ENCOUNTER — Other Ambulatory Visit: Payer: Self-pay

## 2017-05-20 ENCOUNTER — Encounter: Payer: 59 | Admitting: Internal Medicine

## 2017-05-20 DIAGNOSIS — R161 Splenomegaly, not elsewhere classified: Secondary | ICD-10-CM

## 2017-05-20 DIAGNOSIS — D696 Thrombocytopenia, unspecified: Secondary | ICD-10-CM

## 2017-05-20 NOTE — Progress Notes (Signed)
ambulatory

## 2017-05-29 ENCOUNTER — Other Ambulatory Visit: Payer: Self-pay

## 2017-05-29 ENCOUNTER — Ambulatory Visit (AMBULATORY_SURGERY_CENTER): Payer: 59 | Admitting: Internal Medicine

## 2017-05-29 ENCOUNTER — Encounter: Payer: Self-pay | Admitting: Internal Medicine

## 2017-05-29 VITALS — BP 135/66 | HR 61 | Temp 98.9°F | Resp 17 | Ht 67.0 in | Wt 319.0 lb

## 2017-05-29 DIAGNOSIS — J449 Chronic obstructive pulmonary disease, unspecified: Secondary | ICD-10-CM | POA: Diagnosis not present

## 2017-05-29 DIAGNOSIS — I2510 Atherosclerotic heart disease of native coronary artery without angina pectoris: Secondary | ICD-10-CM | POA: Diagnosis not present

## 2017-05-29 DIAGNOSIS — K219 Gastro-esophageal reflux disease without esophagitis: Secondary | ICD-10-CM | POA: Diagnosis present

## 2017-05-29 DIAGNOSIS — I10 Essential (primary) hypertension: Secondary | ICD-10-CM | POA: Diagnosis not present

## 2017-05-29 HISTORY — PX: ESOPHAGOGASTRODUODENOSCOPY: SHX1529

## 2017-05-29 MED ORDER — SODIUM CHLORIDE 0.9 % IV SOLN: 500.0000 mL | Freq: Once | INTRAVENOUS | Status: DC

## 2017-05-29 NOTE — Progress Notes (Signed)
Report to PACU, RN, vss, BBS= Clear.  

## 2017-05-29 NOTE — Patient Instructions (Signed)
YOU HAD AN ENDOSCOPIC PROCEDURE TODAY AT [facility]:   Refer to the procedure report that was given to you for any specific questions about what was found during the examination.  If the procedure report does not answer your questions, please call your gastroenterologist to clarify.  If you requested that your care partner not be given the details of your procedure findings, then the procedure report has been included in a sealed envelope for you to review at your convenience later.  YOU SHOULD EXPECT: Some feelings of bloating in the abdomen. Passage of more gas than usual.  Walking can help get rid of the air that was put into your GI tract during the procedure and reduce the bloating. I  Please Note:  You might notice some irritation and congestion in your nose or some drainage.  This is from the oxygen used during your procedure.  There is no need for concern and it should clear up in a day or so.  SYMPTOMS TO REPORT IMMEDIATELY:    Following upper endoscopy (EGD)  Vomiting of blood or coffee ground material  New chest pain or pain under the shoulder blades  Painful or persistently difficult swallowing  New shortness of breath  Fever of 100F or higher  Black, tarry-looking stools  For urgent or emergent issues, a gastroenterologist can be reached at any hour by calling [phone].   DIET:  We do recommend a small meal at first, but then you may proceed to your regular diet.  Drink plenty of fluids but you should avoid alcoholic beverages for 24 hours.  ACTIVITY:  You should plan to take it easy for the rest of today and you should NOT DRIVE or use heavy machinery until tomorrow (because of the sedation medicines used during the test).    FOLLOW UP: Our staff will call the number listed on your records the next business day following your procedure to check on you and address any questions or concerns that you may have regarding the information given to you  following your procedure. If we do not reach you, we will leave a message.  However, if you are feeling well and you are not experiencing any problems, there is no need to return our call.  We will assume that you have returned to your regular daily activities without incident.  If any biopsies were taken you will be contacted by phone or by letter within the next 1-3 weeks.  Please call us at [phone] if you have not heard about the biopsies in 3 weeks.    SIGNATURES/CONFIDENTIALITY: You and/or your care partner have signed paperwork which will be entered into your electronic medical record.  These signatures attest to the fact that that the information above on your After Visit Summary has been reviewed and is understood.  Full responsibility of the confidentiality of this discharge information lies with you and/or your care-partner.  Read all handouts given to you by your recovery room nurse.

## 2017-05-29 NOTE — Op Note (Signed)
Nancy [name] Patient Name: Nancy [name] Procedure Date: 05/29/2017 10:42 AM MRN: [id number] Endoscopist: Docia Chuck. Henrene Pastor , MD Age: 62 Referring MD:  Date of Birth: 09-Oct-1955 Gender: Female Account #: [id number] Procedure:                Upper GI endoscopy Indications:              Esophageal reflux, Chest pain (non cardiac) Medicines:                Monitored Anesthesia Care Procedure:                Pre-Anesthesia Assessment:                           - Prior to the procedure, a History and Physical                            was performed, and patient medications and                            allergies were reviewed. The patient's tolerance of                            previous anesthesia was also reviewed. The risks                            and benefits of the procedure and the sedation                            options and risks were discussed with the patient.                            All questions were answered, and informed consent                            was obtained. Prior Anticoagulants: The patient has                            taken no previous anticoagulant or antiplatelet                            agents. ASA Grade Assessment: II - A patient with                            mild systemic disease. After reviewing the risks                            and benefits, the patient was deemed in                            satisfactory condition to undergo the procedure.                           After obtaining informed consent, the endoscope was  passed under direct vision. Throughout the                            procedure, the patient's blood pressure, pulse, and                            oxygen saturations were monitored continuously. The                            Endoscope was introduced through the mouth, and                            advanced to the second part of duodenum. The upper                            GI endoscopy  was accomplished without difficulty.                            The patient tolerated the procedure well. Scope In: Scope Out: Findings:                 The esophagus was normal. No Barrett's.                           The stomach Revealed a very small hiatal hernia.                            There was mild linear antral erythema suggesting                            early GAVE.                           The examined duodenum was normal.                           The cardia and gastric fundus were normal on                            retroflexion. Complications:            No immediate complications. Estimated Blood Loss:     Estimated blood loss: none. Impression:               - Normal esophagus.                           - Possibly early GAVE.                           - Otherwise normal exam.                           - No specimens collected. Recommendation:           1. Reflux precautions  2. Weight loss                           3. Continue current medications                           4. Keep hematology appointment                           5. GI follow-up as needed. Docia Chuck. Henrene Pastor, MD 05/29/2017 11:00:38 AM This report has been signed electronically.

## 2017-05-30 ENCOUNTER — Telehealth: Payer: Self-pay | Admitting: *Deleted

## 2017-05-30 NOTE — Telephone Encounter (Signed)
No anwer, left message to call if questions or concerns.

## 2017-06-03 ENCOUNTER — Telehealth: Payer: Self-pay | Admitting: Family Medicine

## 2017-06-03 ENCOUNTER — Encounter: Payer: Self-pay | Admitting: Family Medicine

## 2017-06-03 ENCOUNTER — Ambulatory Visit (INDEPENDENT_AMBULATORY_CARE_PROVIDER_SITE_OTHER): Payer: 59 | Admitting: Family Medicine

## 2017-06-03 VITALS — BP 120/80 | HR 77 | Temp 98.9°F | Ht 67.5 in | Wt 311.8 lb

## 2017-06-03 DIAGNOSIS — E7800 Pure hypercholesterolemia, unspecified: Secondary | ICD-10-CM | POA: Diagnosis not present

## 2017-06-03 DIAGNOSIS — E039 Hypothyroidism, unspecified: Secondary | ICD-10-CM | POA: Diagnosis not present

## 2017-06-03 DIAGNOSIS — Z0000 Encounter for general adult medical examination without abnormal findings: Secondary | ICD-10-CM

## 2017-06-03 DIAGNOSIS — I10 Essential (primary) hypertension: Secondary | ICD-10-CM | POA: Diagnosis not present

## 2017-06-03 DIAGNOSIS — E669 Obesity, unspecified: Secondary | ICD-10-CM | POA: Diagnosis not present

## 2017-06-03 LAB — BASIC METABOLIC PANEL
BUN: 17 mg/dL (ref 6–23)
CHLORIDE: 103 meq/L (ref 96–112)
CO2: 31 mEq/L (ref 19–32)
Calcium: 8.5 mg/dL (ref 8.4–10.5)
Creatinine, Ser: 1.15 mg/dL (ref 0.40–1.20)
GFR: 51.26 mL/min — AB (ref >60.00)
Glucose, Bld: 106 mg/dL — ABNORMAL HIGH (ref 70–99)
POTASSIUM: 4.4 meq/L (ref 3.5–5.1)
SODIUM: 142 meq/L (ref 135–145)

## 2017-06-03 LAB — CBC WITH DIFFERENTIAL/PLATELET
BASOS ABS: 0.1 10*3/uL (ref 0.0–0.1)
Basophils Relative: 1.1 % (ref 0.0–3.0)
EOS PCT: 4.5 % (ref 0.0–5.0)
Eosinophils Absolute: 0.4 10*3/uL (ref 0.0–0.7)
HEMATOCRIT: 42.2 % (ref 36.0–46.0)
Hemoglobin: 14.4 g/dL (ref 12.0–15.0)
LYMPHS ABS: 1.4 10*3/uL (ref 0.7–4.0)
LYMPHS PCT: 17.5 % (ref 12.0–46.0)
MCHC: 34.1 g/dL (ref 30.0–36.0)
MCV: 87.6 fl (ref 78.0–100.0)
MONOS PCT: 9.2 % (ref 3.0–12.0)
Monocytes Absolute: 0.7 10*3/uL (ref 0.1–1.0)
NEUTROS ABS: 5.4 10*3/uL (ref 1.4–7.7)
Neutrophils Relative %: 67.7 % (ref 43.0–77.0)
Platelets: 177 10*3/uL (ref 150.0–400.0)
RBC: 4.82 Mil/uL (ref 3.87–5.11)
RDW: 15.1 % (ref 11.5–15.5)
WBC: 8 10*3/uL (ref 4.0–10.5)

## 2017-06-03 LAB — POC URINALSYSI DIPSTICK (AUTOMATED)
BILIRUBIN UA: NEGATIVE
Blood, UA: NEGATIVE
Glucose, UA: NEGATIVE
KETONES UA: NEGATIVE
LEUKOCYTES UA: NEGATIVE
NITRITE UA: NEGATIVE
PH UA: 6 (ref 5.0–8.0)
PROTEIN UA: NEGATIVE
Spec Grav, UA: >=1.03 — AB (ref 1.010–1.025)
Urobilinogen, UA: 0.2 E.U./dL

## 2017-06-03 LAB — LIPID PANEL
Cholesterol: 159 mg/dL (ref 0–200)
HDL: 50.8 mg/dL (ref >39.00)
LDL CALC: 77 mg/dL (ref 0–99)
NONHDL: 108.51
TRIGLYCERIDES: 157 mg/dL — AB (ref 0.0–149.0)
Total CHOL/HDL Ratio: 3
VLDL: 31.4 mg/dL (ref 0.0–40.0)

## 2017-06-03 LAB — HEPATIC FUNCTION PANEL
ALT: 20 U/L (ref 0–35)
AST: 17 U/L (ref 0–37)
Albumin: 4.1 g/dL (ref 3.5–5.2)
Alkaline Phosphatase: 70 U/L (ref 39–117)
BILIRUBIN DIRECT: 0.1 mg/dL (ref 0.0–0.3)
BILIRUBIN TOTAL: 0.6 mg/dL (ref 0.2–1.2)
Total Protein: 6.7 g/dL (ref 6.0–8.3)

## 2017-06-03 LAB — TSH: TSH: 0.96 u[IU]/mL (ref 0.35–4.50)

## 2017-06-03 LAB — T3, FREE: T3, Free: 3.5 pg/mL (ref 2.3–4.2)

## 2017-06-03 LAB — T4, FREE: Free T4: 1.03 ng/dL (ref 0.60–1.60)

## 2017-06-03 MED ORDER — PHENTERMINE HCL 37.5 MG PO CAPS: 37.5000 mg | ORAL_CAPSULE | ORAL | 1 refills | Status: DC

## 2017-06-03 MED ORDER — ATORVASTATIN CALCIUM 80 MG PO TABS: 80.0000 mg | ORAL_TABLET | Freq: Every day | ORAL | 3 refills | Status: DC

## 2017-06-03 MED ORDER — ALBUTEROL SULFATE HFA 108 (90 BASE) MCG/ACT IN AERS: 2.0000 | INHALATION_SPRAY | RESPIRATORY_TRACT | 5 refills | Status: DC | PRN

## 2017-06-03 MED ORDER — OMEPRAZOLE 40 MG PO CPDR: 40.0000 mg | DELAYED_RELEASE_CAPSULE | Freq: Two times a day (BID) | ORAL | 3 refills | Status: DC

## 2017-06-03 MED FILL — PHENTERMINE 37.5 MG CAPSULE: 37.5 | 90 days supply | Qty: 90 | Fill #0

## 2017-06-03 MED FILL — VENTOLIN HFA 90 MCG INHALER: 108 (90 BAS | 16 days supply | Qty: 18 | Fill #0

## 2017-06-03 MED FILL — ATORVASTATIN 80 MG TABLET: 80 | 90 days supply | Qty: 90 | Fill #0

## 2017-06-03 MED FILL — OMEPRAZOLE 40 MG CPDR: 40 | 90 days supply | Qty: 180 | Fill #0

## 2017-06-03 NOTE — Telephone Encounter (Signed)
Please advise correct sig.

## 2017-06-03 NOTE — Progress Notes (Signed)
   Subjective:    Patient ID: Nancy [name], female    DOB: 1955/04/19, 62 y.o.   MRN: [id number]  HPI Here for a well exam. She feels well but she is concerned about her weight. She asks of she can meet with a Nutritionist and she asks about trying an appetite suppressant. She saw Dr. Sallyanne Kuster on 05-15-17 and he was fairly satisfied with her cardiac status. She had an EGD per Dr. Henrene Pastor recently showed some gastric erythema. An US showed some splenomegaly and her last CBC showed a lot platetlet count at 96 K. This could be the result of splenic sequestration. She is scheduled to see [facility] to evaluate this on 06-19-17.    Review of Systems  Constitutional: Negative.   HENT: Negative.   Eyes: Negative.   Respiratory: Negative.   Cardiovascular: Negative.   Gastrointestinal: Negative.   Genitourinary: Negative for decreased urine volume, difficulty urinating, dyspareunia, dysuria, enuresis, flank pain, frequency, hematuria, pelvic pain and urgency.  Musculoskeletal: Negative.   Skin: Negative.   Neurological: Negative.   Psychiatric/Behavioral: Negative.        Objective:   Physical Exam  Constitutional: She is oriented to person, place, and time. No distress.  Morbidly obese   HENT:  Head: Normocephalic and atraumatic.  Right Ear: External ear normal.  Left Ear: External ear normal.  Nose: Nose normal.  Mouth/Throat: Oropharynx is clear and moist. No oropharyngeal exudate.  Eyes: Pupils are equal, round, and reactive to light. Conjunctivae and EOM are normal. No scleral icterus.  Neck: Normal range of motion. Neck supple. No JVD present. No thyromegaly present.  Cardiovascular: Normal rate, regular rhythm, normal heart sounds and intact distal pulses. Exam reveals no gallop and no friction rub.  No murmur heard. Pulmonary/Chest: Effort normal and breath sounds normal. No respiratory distress. She has no wheezes. She has no rales. She exhibits no  tenderness.  Abdominal: Soft. Bowel sounds are normal. She exhibits no distension and no mass. There is no tenderness. There is no rebound and no guarding.  Musculoskeletal: Normal range of motion. She exhibits no edema or tenderness.  Lymphadenopathy:    She has no cervical adenopathy.  Neurological: She is alert and oriented to person, place, and time. She has normal reflexes. She displays normal reflexes. No cranial nerve deficit. She exhibits normal muscle tone. Coordination normal.  Skin: Skin is warm and dry. No rash noted. No erythema.  Psychiatric: She has a normal mood and affect. Her behavior is normal. Judgment and thought content normal.          Assessment & Plan:  Well exam. We discussed diet and exercise. We will refer her to Nutrition for dietary education. She will try Phentermine daily. Get fasting labs today. Alysia Penna, MD

## 2017-06-03 NOTE — Telephone Encounter (Signed)
Copied from [place] [id number]. Topic: Quick Communication - See Telephone Encounter >> Jun 03, 2017 11:09 AM Boyd Kerbs wrote: CRM for notification. See Telephone encounter for: 06/03/17.  [place] at [facility] needing clarification on directions   omeprazole (PRILOSEC) 40 MG capsule 180 capsule 3 06/03/2017   Sig - Route: Take 1 capsule (40 mg total) by mouth 2 (two) times daily. Take one every evening - Oral  Sent to pharmacy as: omeprazole (PRILOSEC) 40 MG capsule

## 2017-06-03 NOTE — Telephone Encounter (Signed)
Medication filled to pharmacy as requested w/ updated instructions.

## 2017-06-03 NOTE — Telephone Encounter (Signed)
She should be taking this twice a day

## 2017-06-04 MED FILL — TEMAZEPAM 15 MG CAPSULE: 15 | 90 days supply | Qty: 180 | Fill #1

## 2017-06-04 MED FILL — METOPROLOL TARTRATE 25 MG T: 25 | 90 days supply | Qty: 90 | Fill #0

## 2017-06-05 ENCOUNTER — Telehealth: Payer: 59 | Admitting: Physician Assistant

## 2017-06-05 DIAGNOSIS — J208 Acute bronchitis due to other specified organisms: Secondary | ICD-10-CM

## 2017-06-05 MED ORDER — BENZONATATE 100 MG PO CAPS: 100.0000 mg | ORAL_CAPSULE | Freq: Three times a day (TID) | ORAL | 0 refills | Status: DC | PRN

## 2017-06-05 NOTE — Progress Notes (Signed)

## 2017-06-18 ENCOUNTER — Other Ambulatory Visit: Payer: Self-pay | Admitting: Family

## 2017-06-18 DIAGNOSIS — R161 Splenomegaly, not elsewhere classified: Secondary | ICD-10-CM

## 2017-06-18 DIAGNOSIS — D696 Thrombocytopenia, unspecified: Secondary | ICD-10-CM

## 2017-06-19 ENCOUNTER — Inpatient Hospital Stay: Payer: 59 | Attending: Family | Admitting: Family

## 2017-06-19 ENCOUNTER — Encounter: Payer: Self-pay | Admitting: Family

## 2017-06-19 ENCOUNTER — Inpatient Hospital Stay: Payer: 59

## 2017-06-19 ENCOUNTER — Other Ambulatory Visit: Payer: Self-pay

## 2017-06-19 VITALS — BP 131/60 | HR 71 | Temp 98.1°F | Resp 18 | Wt 304.0 lb

## 2017-06-19 DIAGNOSIS — K7581 Nonalcoholic steatohepatitis (NASH): Secondary | ICD-10-CM | POA: Diagnosis not present

## 2017-06-19 DIAGNOSIS — R161 Splenomegaly, not elsewhere classified: Secondary | ICD-10-CM | POA: Insufficient documentation

## 2017-06-19 DIAGNOSIS — D508 Other iron deficiency anemias: Secondary | ICD-10-CM

## 2017-06-19 DIAGNOSIS — D696 Thrombocytopenia, unspecified: Secondary | ICD-10-CM | POA: Insufficient documentation

## 2017-06-19 LAB — CBC WITH DIFFERENTIAL (CANCER CENTER ONLY)
Basophils Absolute: 0.1 10*3/uL (ref 0.0–0.1)
Basophils Relative: 1 %
Eosinophils Absolute: 0.3 10*3/uL (ref 0.0–0.5)
Eosinophils Relative: 6 %
HCT: 42.9 % (ref 34.8–46.6)
Hemoglobin: 14.8 g/dL (ref 11.6–15.9)
Lymphocytes Relative: 23 %
Lymphs Abs: 1.3 10*3/uL (ref 0.9–3.3)
MCH: 29.7 pg (ref 26.0–34.0)
MCHC: 34.5 g/dL (ref 32.0–36.0)
MCV: 86 fL (ref 81.0–101.0)
MONO ABS: 0.5 10*3/uL (ref 0.1–0.9)
MONOS PCT: 10 %
NEUTROS ABS: 3.3 10*3/uL (ref 1.5–6.5)
NEUTROS PCT: 60 %
Platelet Count: 150 10*3/uL (ref 145–400)
RBC: 4.99 MIL/uL (ref 3.70–5.32)
RDW: 13.5 % (ref 11.1–15.7)
WBC Count: 5.5 10*3/uL (ref 3.9–10.0)

## 2017-06-19 LAB — CMP (CANCER CENTER ONLY)
ALT: 30 U/L (ref 0–55)
ANION GAP: 10 (ref 3–11)
AST: 23 U/L (ref 5–34)
Albumin: 4.2 g/dL (ref 3.5–5.0)
Alkaline Phosphatase: 77 U/L (ref 40–150)
BILIRUBIN TOTAL: 0.6 mg/dL (ref 0.2–1.2)
BUN: 19 mg/dL (ref 7–26)
CO2: 27 mmol/L (ref 22–29)
Calcium: 8.8 mg/dL (ref 8.4–10.4)
Chloride: 105 mmol/L (ref 98–109)
Creatinine: 1.49 mg/dL — ABNORMAL HIGH (ref 0.60–1.10)
GFR, EST AFRICAN AMERICAN: 43 mL/min — AB (ref >60)
GFR, EST NON AFRICAN AMERICAN: 37 mL/min — AB (ref >60)
Glucose, Bld: 110 mg/dL (ref 70–140)
POTASSIUM: 4.1 mmol/L (ref 3.5–5.1)
Sodium: 142 mmol/L (ref 136–145)
TOTAL PROTEIN: 7.4 g/dL (ref 6.4–8.3)

## 2017-06-19 LAB — SAVE SMEAR

## 2017-06-19 LAB — PLATELET BY CITRATE

## 2017-06-19 LAB — LACTATE DEHYDROGENASE: LDH: 221 U/L (ref 125–245)

## 2017-06-19 MED FILL — LOSARTAN POTASSIUM 100 MG T: 100 | 90 days supply | Qty: 90 | Fill #1

## 2017-06-19 NOTE — Progress Notes (Signed)
Hematology/Oncology Consultation   Name: Nancy [name]      MRN: [id number]    Location: Room/bed info not found  Date: 06/19/2017 Time:11:07 AM   REFERRING PHYSICIAN: Annie Main A. Sarajane Jews, MD  REASON FOR CONSULT: Splenomegaly and thrombocytopenia    DIAGNOSIS: Splenomegaly  HISTORY OF PRESENT ILLNESS: Nancy [name] is a very pleasant 62 yo caucasian female with recent thrombocytopenia and splenomegaly (16.2 cm in length with 717.4 ml volume). She is symptomatic with persistent fatigue and sleeping a lot more.  Her platelet count has resolved at 150, no anemia. WBC count is 5.5.  She had an endoscopy earlier this month that showed possible early GAVE (gastric antral vascular ectasia). She is on Prilosec BID for GERD.  She is tender in the right upper right quadrant of the abdomen on exam.  She states that is no family history of liver problems that she knows of.  No personal cancer history. Family history includes mother - breast x 2 and vulva, father - skin, maternal grandfather - stomach, maternal uncles - lung (smokers) and paternal grandfather - colon. She had her colonoscopy in 2010 and is due again next year.   She has frequent nose bleeds lasting 2-10 minutes and bruises easily on daily aspirin. No other bleeding, no petechiae.  She states that she had an MI at 62 yo due to volume loss with heavy cycles and was placed on iron. She is now through the change of life and no longer has a cycle.  She has had multiple surgeries (C-section, thyroidectomy due to goiter, hernia repair) without bleeding. No fever, chills, n/v, cough, rash, dizziness, SOB, chest pain, palpitations or changes in bowel or bladder habits at this time. ' She has constipation at times.  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   She has maintained a good appetite and is staying well hydrated. Her weight is stable.  She works full time as  a Statistician.  She has occasional puffiness in her feet and ankles that comes and goes. She has tingling in her fingertips that she feels may be due to carpal tunnel.   ROS: All other 10 point review of systems is negative.   PAST MEDICAL HISTORY:   Past Medical History:  Diagnosis Date  . Allergic rhinitis    gets shots per Dr. Harold Hedge   . Anemia 2001  . Anginal pain ([place])   . COPD (chronic obstructive pulmonary disease) ([place])    "CXR just showed mild COPD" (10/24/2011)  . Coronary artery disease    had MI in 2001, seees Dr. Fletcher Anon at [facility]  . GERD (gastroesophageal reflux disease)   . Gynecological examination    sees Dr. Cherylann Banas   . History of cardiac catheterization 07-24-07   showed nonconclusive disease  . Hyperlipidemia   . Hypertension   . Hypothyroidism   . Migraines    "have a history of migraines; haven't had one for years" (10/24/2011)  . Myocardial infarction [facility]) 2001?    ALLERGIES: Allergies  Allergen Reactions  . Sulfamethoxazole Hives      MEDICATIONS:  Current Outpatient Medications on File Prior to Visit  Medication Sig Dispense Refill  . albuterol (PROVENTIL HFA;VENTOLIN HFA) 108 (90 Base) MCG/ACT inhaler Inhale 2 puffs into the lungs every 4 (four) hours as needed for wheezing or shortness of breath. 1 Inhaler 5  . aspirin 325 MG tablet Take 325 mg by  mouth daily.    Marland Kitchen atorvastatin (LIPITOR) 80 MG tablet Take 1 tablet (80 mg total) by mouth daily. 90 tablet 3  . benzonatate (TESSALON) 100 MG capsule Take 1 capsule (100 mg total) by mouth 3 (three) times daily as needed for cough. 20 capsule 0  . Black Cohosh 40 MG CAPS Take 1 capsule by mouth daily.      Marland Kitchen CALCIUM PO Take 1,800 mg by mouth at bedtime.    . cetirizine (ZYRTEC) 10 MG tablet Take 10 mg by mouth daily.    . cholecalciferol (VITAMIN D) 1000 units tablet Take 1,000 Units by mouth daily.    Marland Kitchen KRILL OIL PO Take 1 capsule by mouth daily.    Marland Kitchen levothyroxine  (SYNTHROID, LEVOTHROID) 200 MCG tablet Take 1 tablet (200 mcg total) by mouth daily. 90 tablet 3  . losartan (COZAAR) 100 MG tablet Take 1 tablet (100 mg total) by mouth daily. 90 tablet 3  . metoprolol tartrate (LOPRESSOR) 25 MG tablet Take 0.5 tablets (12.5 mg total) by mouth 2 (two) times daily. 90 tablet 3  . Multiple Vitamin (MULTIVITAMIN) tablet Take 1 tablet by mouth daily.      Marland Kitchen omeprazole (PRILOSEC) 40 MG capsule Take 1 capsule (40 mg total) by mouth 2 (two) times daily. 180 capsule 3  . phentermine 37.5 MG capsule Take 1 capsule (37.5 mg total) by mouth every morning. 90 capsule 1  . temazepam (RESTORIL) 30 MG capsule Take 1 capsule (30 mg total) by mouth at bedtime. 90 capsule 1  . traMADol (ULTRAM) 50 MG tablet Take 2 tablets (100 mg total) by mouth every 8 (eight) hours as needed for moderate pain. 540 tablet 1  . vitamin E (VITAMIN E) 400 UNIT capsule Take 400 Units by mouth daily.       Current Facility-Administered Medications on File Prior to Visit  Medication Dose Route Frequency Provider Last Rate Last Dose  . 0.9 %  sodium chloride infusion  500 mL Intravenous Once Irene Shipper, MD         PAST SURGICAL HISTORY Past Surgical History:  Procedure Laterality Date  . CARDIAC CATHETERIZATION  2009  . CESAREAN SECTION  1984  . COLONOSCOPY  10-06-08   per Dr. Henrene Pastor, benign polyps, repeat in 10 yrs   . ESOPHAGOGASTRODUODENOSCOPY  05/29/2017   per Dr. Henrene Pastor, normal except slight gastritis   . HERNIA REPAIR  ~ 2007   ventral hernia repair  . THYROIDECTOMY  1990's  . TONSILLECTOMY     "when I was a kid"    FAMILY HISTORY: Family History  Problem Relation Age of Onset  . Breast cancer Mother   . Pulmonary fibrosis Mother   . Coronary artery disease Father   . Hypertension Father   . Cancer Other        breast,colon,prostate  . Alcohol abuse Other   . Hyperlipidemia Other   . Hypertension Other   . Stroke Other   . Heart disease Other   . Stomach cancer Paternal  Grandmother   . Colon cancer Paternal Grandfather     SOCIAL HISTORY:  reports that she quit smoking about 10 years ago. Her smoking use included cigarettes. She has a 30.00 pack-year smoking history. She has never used smokeless tobacco. She reports that she does not drink alcohol or use drugs.  PERFORMANCE STATUS: The patient's performance status is 1 - Symptomatic but completely ambulatory  PHYSICAL EXAM: Most Recent Vital Signs: There were no vitals taken for this  visit. BP 131/60 (BP Location: Left Arm, Patient Position: Sitting)   Pulse 71   Temp 98.1 F (36.7 C) (Oral)   Resp 18   Wt (!) 304 lb (137.9 kg)   SpO2 94%   BMI 46.91 kg/m   General Appearance:    Alert, cooperative, no distress, appears stated age  Head:    Normocephalic, without obvious abnormality, atraumatic  Eyes:    PERRL, conjunctiva/corneas clear, EOM's intact, fundi    benign, both eyes        Throat:   Lips, mucosa, and tongue normal; teeth and gums normal  Neck:   Supple, symmetrical, trachea midline, no adenopathy;    thyroid:  no enlargement/tenderness/nodules; no carotid   bruit or JVD  Back:     Symmetric, no curvature, ROM normal, no CVA tenderness  Lungs:     Clear to auscultation bilaterally, respirations unlabored  Chest Wall:    No tenderness or deformity   Heart:    Regular rate and rhythm, S1 and S2 normal, no murmur, rub   or gallop     Abdomen:     Soft, non-tender, bowel sounds active all four quadrants,    no masses, no organomegaly        Extremities:   Extremities normal, atraumatic, no cyanosis or edema  Pulses:   2+ and symmetric all extremities  Skin:   Skin color, texture, turgor normal, no rashes or lesions  Lymph nodes:   Cervical, supraclavicular, and axillary nodes normal  Neurologic:   CNII-XII intact, normal strength, sensation and reflexes    throughout    LABORATORY DATA:  Results for orders placed or performed in visit on 06/19/17 (from the past 48 hour(s))   CBC with Differential (Cancer Center Only)     Status: None   Collection Time: 06/19/17 10:42 AM  Result Value Ref Range   WBC Count 5.5 3.9 - 10.0 K/uL   RBC 4.99 3.70 - 5.32 MIL/uL   Hemoglobin 14.8 11.6 - 15.9 g/dL   HCT 42.9 34.8 - 46.6 %   MCV 86.0 81.0 - 101.0 fL   MCH 29.7 26.0 - 34.0 pg   MCHC 34.5 32.0 - 36.0 g/dL   RDW 13.5 11.1 - 15.7 %   Platelet Count 150 145 - 400 K/uL   Neutrophils Relative % 60 %   Neutro Abs 3.3 1.5 - 6.5 K/uL   Lymphocytes Relative 23 %   Lymphs Abs 1.3 0.9 - 3.3 K/uL   Monocytes Relative 10 %   Monocytes Absolute 0.5 0.1 - 0.9 K/uL   Eosinophils Relative 6 %   Eosinophils Absolute 0.3 0.0 - 0.5 K/uL   Basophils Relative 1 %   Basophils Absolute 0.1 0.0 - 0.1 K/uL    Comment: Performed at [facility] Lab at [facility], [address] [place], [place] [address]  Save smear     Status: None   Collection Time: 06/19/17 10:42 AM  Result Value Ref Range   Smear Review SMEAR STAINED AND AVAILABLE FOR REVIEW     Comment: Performed at [facility] at [facility], [address] [place], [place] [address]  Platelet by [place]     Status: None   Collection Time: 06/19/17 10:42 AM  Result Value Ref Range   Platelet CT in [place]     Comment: Performed at [facility] at [facility], [address] [place], [place]  [address]      RADIOGRAPHY: No results found.     PATHOLOGY: None  ASSESSMENT/PLAN: Ms. Stopka is a very pleasant 62 yo caucasian female with recent thrombocytopenia and splenomegaly (16.2 cm in length with 717.4 ml volume). She is symptomatic with fatigue and discomfort in the right upper quadrant.  Her splenomegaly is likely due to portal hypertension with the beginnings of NASH. We will do a follow-up US of the abdomen to monitor and follow-up with lab in September (4 months).  Iron studies are pending.  All questions were answered and she is in  agreement with the plan. She will contact our office with any questions or concerns. We can certainly see her much sooner if needed.  She was discussed with and also seen by Dr. Marin Olp and he is in agreement with the aforementioned.   Laverna Peace     Addendum: I saw and examined the patient with Judson Roch.  I agree with the above assessment.  I have to believe that this splenomegaly is related to her NASH.  She has had this for a while.  Her platelet count is coming up quite nicely.  I do not see an issue with the enlarged spleen causing thrombocytopenia.  I do not see a role for a bone marrow biopsy with Ms. Kammerer.  I did look at her blood smear under the microscope.  I did not see y any abnormalities with her platelets.  There were no immature white blood cells.  There are no nucleated red blood cells.  There is no schistocytes.  She had no rouleaux formation.  Overall, I just feel that the splenomegaly is physiologic in response to the NASH and likely portal hypertension.   it will be nice to get a ultrasound of her abdomen so we can see how things look in a few months.  We spent about 40 minutes with Ms. Hase.  Over half time spent face-to-face with her.  She is very nice.  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  Lattie Haw, MD

## 2017-06-20 LAB — RHEUMATOID FACTOR: Rhuematoid fact SerPl-aCnc: 10.5 IU/mL (ref 0.0–13.9)

## 2017-06-20 LAB — IRON AND TIBC
Iron: 107 ug/dL (ref 41–142)
SATURATION RATIOS: 31 % (ref 21–57)
TIBC: 349 ug/dL (ref 236–444)
UIBC: 242 ug/dL

## 2017-06-20 LAB — FERRITIN: FERRITIN: 86 ng/mL (ref 9–269)

## 2017-06-20 LAB — BETA 2 MICROGLOBULIN, SERUM: BETA 2 MICROGLOBULIN: 2.4 mg/L (ref 0.6–2.4)

## 2017-06-21 LAB — ANTINUCLEAR ANTIBODIES, IFA: ANA Ab, IFA: NEGATIVE

## 2017-06-25 ENCOUNTER — Encounter: Payer: Self-pay | Admitting: Registered"

## 2017-06-25 ENCOUNTER — Encounter: Payer: 59 | Attending: Family Medicine | Admitting: Registered"

## 2017-06-25 DIAGNOSIS — E669 Obesity, unspecified: Secondary | ICD-10-CM | POA: Insufficient documentation

## 2017-06-25 DIAGNOSIS — Z6841 Body Mass Index (BMI) 40.0 and over, adult: Secondary | ICD-10-CM | POA: Diagnosis not present

## 2017-06-25 DIAGNOSIS — Z713 Dietary counseling and surveillance: Secondary | ICD-10-CM | POA: Insufficient documentation

## 2017-06-25 NOTE — Patient Instructions (Addendum)
-   Increase physical activity with swimming once a week on Monday mornings for 30 minutes.   - Increase intake of fiber with whole grain, fruits, vegetables, and nuts.  - Eat well-balanced meals using Meal Ideas sheet.   - Aim to meal plan upcoming week on Tuesday; grocery shop ahead of time.   - Healthy snacking can include: nuts, fruit, hummus, raw vegetables, etc.

## 2017-06-25 NOTE — Progress Notes (Signed)
Medical Nutrition Therapy:  Appt start time: 10:55 end time: 12:03.  Pt expectations: solid list of meal plans, healthy foods, healthy snacks  Assessment:  Primary concerns today: wants to lose weight. Pt states she is having trouble meal planning and making healthy choices. Pt states she needs help with making healthy choices.  Pt states she lives with daughter who had gastric sleeve. Pt states she has 60 year old twin grandchildren who also live with her and she needs to feed them as well. Pt states she is the main person who purchases and prepares food in the home. Pt states she wants to keep everyone in her home on track.   Pt states she works nights on the weekend (Fri-Sun) and daytime on Thurs. Pt states she eats when she is tired although not hungry.   Pt states she wants to lose weight to increase mobility, wants to feel better, and to be healthier. Pt states she wants to reduce medications. Pt states recent doctor's appt revealed fatty liver and enlarged spleen.   Pt states she likes to swim, but does not like to do any other activities outdoors. Pt states she has a pool in her neighborhood. Pt states she used to swim after work.    Preferred Learning Style:   No preference indicated   Learning Readiness:   Ready  Change in progress   MEDICATIONS: See list   DIETARY INTAKE:  Usual eating pattern includes 2-3 meals and 1-2 snacks per day.  Everyday foods include fruit, salad, grilled chicken, ice cream, candy.  Avoided foods include protein shakes, yogurt, cheese, milk, anything with artificial sweeteners.    24-hr recall:  B ( AM): typically skips; watermelon + Nutrigrain bar  Snk ( AM): none  L ( PM): sometimes skips; McDonald's-2 burgers or salad with grilled chicken Snk ( PM): none D ( PM): casserole or chicken or fast food-burgers, fries or [employer]-chicken, cole slaw, green beans Snk ( PM): ice cream, candy Beverages: Coke, coffee, water, sweet tea  Usual  physical activity: none stated  Estimated energy needs: 1600 calories 180 g carbohydrates 120 g protein 44 g fat  Progress Towards Goal(s):  In progress.   Nutritional Diagnosis:  NI-5.8.5 Inadeqate fiber intake As related to food and nutrition-related knowledge deficit concerning desirable quantities of fiber.  As evidenced by pt verbalizes incomplete knowledge.    Intervention:  Nutrition education and counseling. Pt was educated and counseled on aspects of a heart healthy way of living. Pt was educated on the benefits of fiber, how to increase intake, and fiber-rich sources. Pt was educated and counseled on balanced meals and snacks. Pt was educated and counseled on sodium intake, cholesterol intake, and how to read nutrition facts labels. Pt was counseled on how to meal plan for her family. Pt was also educated on the benefits of increasing physical activity and how to add that into her current schedule. Pt was in agreement with goals stated.  Goals:  - Increase physical activity with swimming once a week on Monday mornings for 30 minutes.  - Increase intake of fiber with whole grain, fruits, vegetables, and nuts. - Eat well-balanced meals using Meal Ideas sheet.  - Aim to meal plan upcoming week on Tuesday; grocery shop ahead of time.  - Healthy snacking can include: nuts, fruit, hummus, raw vegetables, etc.   Teaching Method Utilized:  Visual Auditory Hands on  Handouts given during visit include:  Heart health-reduced sodium nutrition therapy  Meal Ideas  Barriers  to learning/adherence to lifestyle change: work-life balance  Demonstrated degree of understanding via:  Teach Back   Monitoring/Evaluation:  Dietary intake, exercise, and body weight prn.

## 2017-07-09 MED FILL — LEVOTHYROXINE 200 MCG TAB: 200 | 90 days supply | Qty: 90 | Fill #1

## 2017-07-23 ENCOUNTER — Emergency Department (HOSPITAL_COMMUNITY): Payer: 59

## 2017-07-23 ENCOUNTER — Encounter (HOSPITAL_COMMUNITY): Payer: Self-pay

## 2017-07-23 ENCOUNTER — Emergency Department (HOSPITAL_COMMUNITY)
Admission: EM | Admit: 2017-07-23 | Discharge: 2017-07-23 | Disposition: A | Discharge status: Home / Self Care | Payer: 59 | Attending: Emergency Medicine | Admitting: Emergency Medicine

## 2017-07-23 ENCOUNTER — Other Ambulatory Visit: Payer: Self-pay

## 2017-07-23 DIAGNOSIS — I10 Essential (primary) hypertension: Secondary | ICD-10-CM | POA: Diagnosis not present

## 2017-07-23 DIAGNOSIS — M25512 Pain in left shoulder: Secondary | ICD-10-CM | POA: Diagnosis not present

## 2017-07-23 DIAGNOSIS — Z7982 Long term (current) use of aspirin: Secondary | ICD-10-CM | POA: Insufficient documentation

## 2017-07-23 DIAGNOSIS — I252 Old myocardial infarction: Secondary | ICD-10-CM | POA: Insufficient documentation

## 2017-07-23 DIAGNOSIS — I2510 Atherosclerotic heart disease of native coronary artery without angina pectoris: Secondary | ICD-10-CM | POA: Insufficient documentation

## 2017-07-23 DIAGNOSIS — Y92410 Unspecified street and highway as the place of occurrence of the external cause: Secondary | ICD-10-CM | POA: Insufficient documentation

## 2017-07-23 DIAGNOSIS — E039 Hypothyroidism, unspecified: Secondary | ICD-10-CM | POA: Insufficient documentation

## 2017-07-23 DIAGNOSIS — S3991XA Unspecified injury of abdomen, initial encounter: Secondary | ICD-10-CM | POA: Diagnosis not present

## 2017-07-23 DIAGNOSIS — J449 Chronic obstructive pulmonary disease, unspecified: Secondary | ICD-10-CM | POA: Insufficient documentation

## 2017-07-23 DIAGNOSIS — S299XXA Unspecified injury of thorax, initial encounter: Secondary | ICD-10-CM | POA: Diagnosis not present

## 2017-07-23 DIAGNOSIS — Z87891 Personal history of nicotine dependence: Secondary | ICD-10-CM | POA: Diagnosis not present

## 2017-07-23 DIAGNOSIS — R1012 Left upper quadrant pain: Secondary | ICD-10-CM | POA: Diagnosis not present

## 2017-07-23 DIAGNOSIS — Y9389 Activity, other specified: Secondary | ICD-10-CM | POA: Diagnosis not present

## 2017-07-23 DIAGNOSIS — S4992XA Unspecified injury of left shoulder and upper arm, initial encounter: Secondary | ICD-10-CM | POA: Diagnosis not present

## 2017-07-23 DIAGNOSIS — I959 Hypotension, unspecified: Secondary | ICD-10-CM | POA: Diagnosis not present

## 2017-07-23 DIAGNOSIS — S20212A Contusion of left front wall of thorax, initial encounter: Secondary | ICD-10-CM

## 2017-07-23 DIAGNOSIS — Y998 Other external cause status: Secondary | ICD-10-CM | POA: Diagnosis not present

## 2017-07-23 DIAGNOSIS — R58 Hemorrhage, not elsewhere classified: Secondary | ICD-10-CM | POA: Diagnosis not present

## 2017-07-23 DIAGNOSIS — S20222A Contusion of left back wall of thorax, initial encounter: Secondary | ICD-10-CM | POA: Insufficient documentation

## 2017-07-23 DIAGNOSIS — Z79899 Other long term (current) drug therapy: Secondary | ICD-10-CM | POA: Insufficient documentation

## 2017-07-23 DIAGNOSIS — S40012A Contusion of left shoulder, initial encounter: Secondary | ICD-10-CM | POA: Diagnosis not present

## 2017-07-23 LAB — CBC WITH DIFFERENTIAL/PLATELET
Basophils Absolute: 0 10*3/uL (ref 0.0–0.1)
Basophils Relative: 1 %
Eosinophils Absolute: 0.2 10*3/uL (ref 0.0–0.7)
Eosinophils Relative: 5 %
HEMATOCRIT: 44.2 % (ref 36.0–46.0)
HEMOGLOBIN: 15 g/dL (ref 12.0–15.0)
LYMPHS ABS: 1.1 10*3/uL (ref 0.7–4.0)
LYMPHS PCT: 23 %
MCH: 29.1 pg (ref 26.0–34.0)
MCHC: 33.9 g/dL (ref 30.0–36.0)
MCV: 85.7 fL (ref 78.0–100.0)
Monocytes Absolute: 0.3 10*3/uL (ref 0.1–1.0)
Monocytes Relative: 6 %
NEUTROS ABS: 3.2 10*3/uL (ref 1.7–7.7)
NEUTROS PCT: 65 %
Platelets: 153 10*3/uL (ref 150–400)
RBC: 5.16 MIL/uL — ABNORMAL HIGH (ref 3.87–5.11)
RDW: 13.3 % (ref 11.5–15.5)
WBC: 4.9 10*3/uL (ref 4.0–10.5)

## 2017-07-23 LAB — I-STAT CHEM 8, ED
BUN: 18 mg/dL (ref 6–20)
CALCIUM ION: 1.1 mmol/L — AB (ref 1.15–1.40)
Chloride: 106 mmol/L (ref 98–111)
Creatinine, Ser: 1.2 mg/dL — ABNORMAL HIGH (ref 0.44–1.00)
GLUCOSE: 102 mg/dL — AB (ref 70–99)
HCT: 41 % (ref 36.0–46.0)
Hemoglobin: 13.9 g/dL (ref 12.0–15.0)
Potassium: 3.7 mmol/L (ref 3.5–5.1)
Sodium: 143 mmol/L (ref 135–145)
TCO2: 25 mmol/L (ref 22–32)

## 2017-07-23 MED ORDER — METHOCARBAMOL 500 MG PO TABS: 500.0000 mg | ORAL_TABLET | Freq: Three times a day (TID) | ORAL | 0 refills | Status: DC | PRN

## 2017-07-23 MED ORDER — IOPAMIDOL (ISOVUE-300) INJECTION 61%: 80.0000 mL | Freq: Once | INTRAVENOUS | Status: DC | PRN

## 2017-07-23 MED ORDER — ONDANSETRON HCL 4 MG/2ML IJ SOLN
4.0000 mg | Freq: Once | INTRAMUSCULAR | Status: AC
  Administered 2017-07-23: 4 mg via INTRAVENOUS
  Filled 2017-07-23: qty 2

## 2017-07-23 MED ORDER — HYDROMORPHONE HCL 1 MG/ML IJ SOLN
0.5000 mg | Freq: Once | INTRAMUSCULAR | Status: AC
  Administered 2017-07-23: 0.5 mg via INTRAVENOUS
  Filled 2017-07-23: qty 1

## 2017-07-23 MED ORDER — IOPAMIDOL (ISOVUE-300) INJECTION 61%
100.0000 mL | Freq: Once | INTRAVENOUS | Status: AC | PRN
  Administered 2017-07-23: 100 mL via INTRAVENOUS

## 2017-07-23 NOTE — ED Notes (Signed)
Md notified patient wants pain medication
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
## 2017-07-30 ENCOUNTER — Encounter: Payer: Self-pay | Admitting: Family Medicine

## 2017-07-30 ENCOUNTER — Ambulatory Visit: Payer: 59 | Admitting: Family Medicine

## 2017-07-30 VITALS — BP 124/80 | HR 65 | Temp 98.3°F | Ht 67.0 in | Wt 292.0 lb

## 2017-07-30 DIAGNOSIS — S39012D Strain of muscle, fascia and tendon of lower back, subsequent encounter: Secondary | ICD-10-CM

## 2017-07-30 MED ORDER — METHYLPREDNISOLONE 4 MG PO TBPK: ORAL_TABLET | ORAL | 0 refills | Status: DC

## 2017-07-30 MED ORDER — METHOCARBAMOL 750 MG PO TABS
750.0000 mg | ORAL_TABLET | Freq: Four times a day (QID) | ORAL | 0 refills | Status: DC | PRN
Start: 2017-07-30 — End: 2018-02-24

## 2017-07-30 MED FILL — METHOCARBAMOL 750 MG TABS: 750 | 15 days supply | Qty: 60 | Fill #0

## 2017-07-30 MED FILL — METHYLPREDNISOLONE 4 MG TAB: 4 | 6 days supply | Qty: 21 | Fill #0
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
## 2017-08-14 ENCOUNTER — Encounter: Payer: Self-pay | Admitting: Family Medicine

## 2017-08-14 ENCOUNTER — Ambulatory Visit: Payer: 59 | Admitting: Family Medicine

## 2017-08-14 VITALS — BP 120/80 | HR 81 | Temp 98.4°F | Ht 67.0 in | Wt 287.8 lb

## 2017-08-14 DIAGNOSIS — M545 Low back pain, unspecified: Secondary | ICD-10-CM | POA: Insufficient documentation

## 2017-08-14 DIAGNOSIS — M79604 Pain in right leg: Secondary | ICD-10-CM | POA: Insufficient documentation

## 2017-08-14 MED ORDER — HYDROCODONE-ACETAMINOPHEN 10-325 MG PO TABS: 1.0000 | ORAL_TABLET | ORAL | 0 refills | Status: DC | PRN

## 2017-08-14 MED FILL — HYDROCODON-APAP 10-325: 10-325 | 5 days supply | Qty: 30 | Fill #0
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
## 2017-08-18 ENCOUNTER — Encounter: Payer: Self-pay | Admitting: Family Medicine

## 2017-08-19 ENCOUNTER — Encounter: Payer: Self-pay | Admitting: Family Medicine

## 2017-08-25 ENCOUNTER — Ambulatory Visit: Payer: 59 | Admitting: Family Medicine

## 2017-08-25 NOTE — Telephone Encounter (Signed)
The form is ready  

## 2017-08-26 NOTE — Telephone Encounter (Signed)
Pt has been scheduled for MRI 09/01/17.

## 2017-09-01 ENCOUNTER — Ambulatory Visit
Admission: RE | Admit: 2017-09-01 | Discharge: 2017-09-01 | Disposition: A | Discharge status: Home / Self Care | Payer: 59 | Source: Ambulatory Visit | Attending: Family Medicine | Admitting: Family Medicine

## 2017-09-01 DIAGNOSIS — M5116 Intervertebral disc disorders with radiculopathy, lumbar region: Secondary | ICD-10-CM | POA: Diagnosis not present

## 2017-09-01 DIAGNOSIS — M79604 Pain in right leg: Secondary | ICD-10-CM

## 2017-09-01 DIAGNOSIS — M545 Low back pain, unspecified: Secondary | ICD-10-CM

## 2017-09-04 NOTE — Addendum Note (Signed)
Addended by: Alysia Penna A on: 09/04/2017 09:28 AM   Modules accepted: Orders

## 2017-09-08 ENCOUNTER — Other Ambulatory Visit: Payer: Self-pay | Admitting: Family Medicine

## 2017-09-08 NOTE — Telephone Encounter (Signed)
Last OV 08/14/2017   Last refilled 02/25/2017 disp 90 with 1 refill   Sent to PCP for approval

## 2017-09-09 ENCOUNTER — Ambulatory Visit (INDEPENDENT_AMBULATORY_CARE_PROVIDER_SITE_OTHER): Payer: Self-pay | Admitting: Family Medicine

## 2017-09-09 VITALS — BP 110/70 | HR 78 | Temp 98.7°F | Resp 16 | Wt 291.8 lb

## 2017-09-09 DIAGNOSIS — J029 Acute pharyngitis, unspecified: Secondary | ICD-10-CM

## 2017-09-09 LAB — POCT RAPID STREP A (OFFICE): Rapid Strep A Screen: NEGATIVE

## 2017-09-09 MED FILL — TEMAZEPAM 15 MG CAPSULE: 15 | 90 days supply | Qty: 180 | Fill #0

## 2017-09-09 NOTE — Progress Notes (Signed)
Nancy [name] is a 62 y.o. female who presents today with concerns of sore throat, dry cough and known exposure to illness. Patient is an employee of the local emergency department and states that she has been exposed lately to multiple upper respiratory infections by patients and that her grandson is sick with similar symptoms and a fever. She is here for evaluation and management of her symptoms that have developed over the past 2-3 days. Outside of work she denies known sick contacts.  Review of Systems  Constitutional: Negative for chills, fever and malaise/fatigue.  HENT: Positive for sore throat. Negative for congestion, ear discharge, ear pain and sinus pain.   Eyes: Negative.   Respiratory: Positive for cough. Negative for sputum production and shortness of breath.   Cardiovascular: Negative.  Negative for chest pain.  Gastrointestinal: Negative for abdominal pain, diarrhea, nausea and vomiting.  Genitourinary: Negative for dysuria, frequency, hematuria and urgency.  Musculoskeletal: Negative for myalgias.  Skin: Negative.   Neurological: Negative for headaches.  Endo/Heme/Allergies: Negative.   Psychiatric/Behavioral: Negative.     O: Vitals:   09/09/17 1737  BP: 110/70  Pulse: 78  Resp: 16  Temp: 98.7 F (37.1 C)  SpO2: 94%     Physical Exam  Constitutional: She is oriented to person, place, and time. Vital signs are normal. She appears well-developed and well-nourished. She is active.  Non-toxic appearance. She does not have a sickly appearance.  HENT:  Head: Normocephalic.  Right Ear: Hearing, tympanic membrane, external ear and ear canal normal.  Left Ear: Hearing, tympanic membrane, external ear and ear canal normal.  Nose: Rhinorrhea present.  Mouth/Throat: Uvula is midline and mucous membranes are normal. Posterior oropharyngeal erythema present.  Neck: Normal range of motion. Neck supple.  Cardiovascular: Normal rate, regular rhythm, normal heart sounds and  normal pulses.  Pulmonary/Chest: Effort normal and breath sounds normal.  Abdominal: Soft. Bowel sounds are normal.  Musculoskeletal: Normal range of motion.  Lymphadenopathy:       Head (right side): No submental and no submandibular adenopathy present.       Head (left side): Tonsillar adenopathy present. No submental and no submandibular adenopathy present.    She has no cervical adenopathy.  Neurological: She is alert and oriented to person, place, and time.  Skin: Skin is warm.  Psychiatric: She has a normal mood and affect.  Vitals reviewed.  A: 1. Sore throat   2. Pharyngitis, unspecified etiology    P: Discussed exam findings, diagnosis etiology and medication use and indications reviewed with patient. Follow- Up and discharge instructions provided. No emergent/urgent issues found on exam.  Patient verbalized understanding of information provided and agrees with plan of care (POC), all questions answered.  PLAN< Recommends increasing hydration,salt water rinses, warm tea with honey and lemon, and throat lozenges (cepacol/halls) for their soothing effects Advised to monitor conditions and f/u in 3-5 days if symptoms worsen, persist or are unimproved will reassess and determine if bacterial indications develop and may Consider antibiotics at that time.   1. Sore throat - POCT rapid strep A Results for orders placed or performed in visit on 09/09/17 (from the past 24 hour(s))  POCT rapid strep A     Status: Normal   Collection Time: 09/09/17  6:07 PM  Result Value Ref Range   Rapid Strep A Screen Negative Negative    2. Pharyngitis, unspecified etiology Symptomatic care treatment will consider Amoxicillin 500 mg BID x 10 days if symptoms worsen or persist and not  improved by day 7.

## 2017-09-09 NOTE — Progress Notes (Signed)
c 

## 2017-09-09 NOTE — Telephone Encounter (Signed)
Yes this should state #180 since she takes 2 at a time

## 2017-09-09 NOTE — Telephone Encounter (Signed)
Call in #90 with one rf

## 2017-09-09 NOTE — Telephone Encounter (Signed)
Rx request is for 15 MG and not 30 MG sent to PCP to advise the disp amount should be 180 and not 90 since she is taking the 15 MG twice daily?   Last Rx was refilled for the 30 MG but the pharmacy currently has the 30 MG dose on back order

## 2017-09-09 NOTE — Patient Instructions (Addendum)
PLAN< Recommends increasing hydration,salt water rinses, warm tea with honey and lemon, and throat lozenges (cepacol/halls) for their soothing effects Advised to monitor conditions and f/u in 3-5 days if symptoms worsen, persist or are unimproved will reassess and determine if bacterial indications develop and may Consider antibiotics at that time.  Pharyngitis Pharyngitis is redness, pain, and swelling (inflammation) of the throat (pharynx). It is a very common cause of sore throat. Pharyngitis can be caused by a bacteria, but it is usually caused by a virus. Most cases of pharyngitis get better on their own without treatment. What are the causes? This condition may be caused by:  Infection by viruses (viral). Viral pharyngitis spreads from person to person (is contagious) through coughing, sneezing, and sharing of personal items or utensils such as cups, forks, spoons, and toothbrushes.  Infection by bacteria (bacterial). Bacterial pharyngitis may be spread by touching the nose or face after coming in contact with the bacteria, or through more intimate contact, such as kissing.  Allergies. Allergies can cause buildup of mucus in the throat (post-nasal drip), leading to inflammation and irritation. Allergies can also cause blocked nasal passages, forcing breathing through the mouth, which dries and irritates the throat.  What increases the risk? You are more likely to develop this condition if:  You are 59-26 years old.  You are exposed to crowded environments such as daycare, school, or dormitory living.  You live in a cold climate.  You have a weakened disease-fighting (immune) system.  What are the signs or symptoms? Symptoms of this condition vary by the cause (viral, bacterial, or allergies) and can include:  Sore throat.  Fatigue.  Low-grade fever.  Headache.  Joint pain and muscle aches.  Skin rashes.  Swollen glands in the throat (lymph nodes).  Plaque-like film on  the throat or tonsils. This is often a symptom of bacterial pharyngitis.  Vomiting.  Stuffy nose (nasal congestion).  Cough.  Red, itchy eyes (conjunctivitis).  Loss of appetite.  How is this diagnosed? This condition is often diagnosed based on your medical history and a physical exam. Your health care provider will ask you questions about your illness and your symptoms. A swab of your throat may be done to check for bacteria (rapid strep test). Other lab tests may also be done, depending on the suspected cause, but these are rare. How is this treated? This condition usually gets better in 3-4 days without medicine. Bacterial pharyngitis may be treated with antibiotic medicines. Follow these instructions at home:  Take over-the-counter and prescription medicines only as told by your health care provider. ? If you were prescribed an antibiotic medicine, take it as told by your health care provider. Do not stop taking the antibiotic even if you start to feel better. ? Do not give children aspirin because of the association with Reye syndrome.  Drink enough water and fluids to keep your urine clear or pale yellow.  Get a lot of rest.  Gargle with a salt-water mixture 3-4 times a day or as needed. To make a salt-water mixture, completely dissolve -1 tsp of salt in 1 cup of warm water.  If your health care provider approves, you may use throat lozenges or sprays to soothe your throat. Contact a health care provider if:  You have large, tender lumps in your neck.  You have a rash.  You cough up green, yellow-brown, or bloody spit. Get help right away if:  Your neck becomes stiff.  You drool or are  unable to swallow liquids.  You cannot drink or take medicines without vomiting.  You have severe pain that does not go away, even after you take medicine.  You have trouble breathing, and it is not caused by a stuffy nose.  You have new pain and swelling in your joints such as  the knees, ankles, wrists, or elbows. Summary  Pharyngitis is redness, pain, and swelling (inflammation) of the throat (pharynx).  While pharyngitis can be caused by a bacteria, the most common causes are viral.  Most cases of pharyngitis get better on their own without treatment.  Bacterial pharyngitis is treated with antibiotic medicines. This information is not intended to replace advice given to you by your health care provider. Make sure you discuss any questions you have with your health care provider. Document Released: 01/14/2005 Document Revised: 02/20/2016 Document Reviewed: 02/20/2016 Elsevier Interactive Patient Education  [employer].

## 2017-09-10 MED FILL — METOPROLOL TARTRATE 25 MG T: 25 | 90 days supply | Qty: 90 | Fill #1

## 2017-09-15 MED FILL — LOSARTAN POTASSIUM 100 MG T: 100 | 90 days supply | Qty: 90 | Fill #2

## 2017-09-15 MED FILL — OMEPRAZOLE 40 MG CPDR: 40 | 90 days supply | Qty: 180 | Fill #1

## 2017-09-15 MED FILL — PHENTERMINE 37.5 MG CAPSULE: 37.5 | 90 days supply | Qty: 90 | Fill #1

## 2017-09-16 ENCOUNTER — Ambulatory Visit: Payer: 59 | Admitting: Physical Therapy

## 2017-09-17 ENCOUNTER — Ambulatory Visit: Payer: 59 | Attending: Family Medicine | Admitting: Physical Therapy

## 2017-09-17 ENCOUNTER — Other Ambulatory Visit: Payer: Self-pay

## 2017-09-17 ENCOUNTER — Encounter: Payer: Self-pay | Admitting: Physical Therapy

## 2017-09-17 DIAGNOSIS — M5441 Lumbago with sciatica, right side: Secondary | ICD-10-CM | POA: Diagnosis not present

## 2017-09-17 DIAGNOSIS — M62830 Muscle spasm of back: Secondary | ICD-10-CM | POA: Diagnosis not present

## 2017-09-17 DIAGNOSIS — M6281 Muscle weakness (generalized): Secondary | ICD-10-CM

## 2017-09-17 NOTE — Patient Instructions (Signed)
Access Code: [email or web address]  URL: [email or web address]  Date: 09/17/2017  Prepared by: Earlie Counts   Exercises  Seated Piriformis Stretch with Trunk Bend - 2 reps - 1 sets - 30 hold - 1x daily - 7x weekly  Seated Hamstring Stretch - 2 reps - 1 sets - 30 hold - 1x daily - 7x weekly  Trigger Point Dry Needling  . What is Trigger Point Dry Needling (DN)? o DN is a physical therapy technique used to treat muscle pain and dysfunction. Specifically, DN helps deactivate muscle trigger points (muscle knots).  o A thin filiform needle is used to penetrate the skin and stimulate the underlying trigger point. The goal is for a local twitch response (LTR) to occur and for the trigger point to relax. No medication of any kind is injected during the procedure.   . What Does Trigger Point Dry Needling Feel Like?  o The procedure feels different for each individual patient. Some patients report that they do not actually feel the needle enter the skin and overall the process is not painful. Very mild bleeding may occur. However, many patients feel a deep cramping in the muscle in which the needle was inserted. This is the local twitch response.   Marland Kitchen How Will I feel after the treatment? o Soreness is normal, and the onset of soreness may not occur for a few hours. Typically this soreness does not last longer than two days.  o Bruising is uncommon, however; ice can be used to decrease any possible bruising.  o In rare cases feeling tired or nauseous after the treatment is normal. In addition, your symptoms may get worse before they get better, this period will typically not last longer than 24 hours.   . What Can I do After My Treatment? o Increase your hydration by drinking more water for the next 24 hours. o You may place ice or heat on the areas treated that have become sore, however, do not use heat on inflamed or bruised areas. Heat often brings more relief post needling. o You can continue your  regular activities, but vigorous activity is not recommended initially after the treatment for 24 hours. o DN is best combined with other physical therapy such as strengthening, stretching, and other therapies.    [facility] [address], [place], [place] [address] Phone # [phone] Fax [phone]
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
## 2017-09-17 NOTE — Addendum Note (Signed)
Addended by: Earlie Counts F on: 09/17/2017 12:39 PM   Modules accepted: Orders

## 2017-09-22 ENCOUNTER — Ambulatory Visit: Payer: 59 | Admitting: Physical Therapy

## 2017-09-22 ENCOUNTER — Encounter: Payer: Self-pay | Admitting: Physical Therapy

## 2017-09-22 DIAGNOSIS — M5441 Lumbago with sciatica, right side: Secondary | ICD-10-CM

## 2017-09-22 DIAGNOSIS — M6281 Muscle weakness (generalized): Secondary | ICD-10-CM | POA: Diagnosis not present

## 2017-09-22 DIAGNOSIS — M62830 Muscle spasm of back: Secondary | ICD-10-CM | POA: Diagnosis not present

## 2017-09-22 NOTE — Therapy (Signed)
[facility] [address] [place], [place], [address] Phone: [phone]   Fax:  [phone]  Physical Therapy Treatment  Patient Details  Name: Nancy [name] MRN: [id number] Date of Birth: 1955-10-06 Referring Provider: Dr. Alysia Penna   Encounter Date: 09/22/2017  PT End of Session - 09/22/17 0803    Visit Number  2    Date for PT Re-Evaluation  10/29/17    PT Start Time  0800    PT Stop Time  0900    PT Time Calculation (min)  60 min    Activity Tolerance  Patient tolerated treatment well    Behavior During Therapy  [facility] for tasks assessed/performed       Past Medical History:  Diagnosis Date  . Allergic rhinitis    gets shots per Dr. Harold Hedge   . Anemia 2001  . Anginal pain ([place])   . COPD (chronic obstructive pulmonary disease) ([place])    "CXR just showed mild COPD" (10/24/2011)  . Coronary artery disease    had MI in 2001, seees Dr. Fletcher Anon at [facility]  . GERD (gastroesophageal reflux disease)   . Gynecological examination    sees Dr. Cherylann Banas   . History of cardiac catheterization 07-24-07   showed nonconclusive disease  . Hyperlipidemia   . Hypertension   . Hypothyroidism   . Migraines    "have a history of migraines; haven't had one for years" (10/24/2011)  . Myocardial infarction [facility]) 2001?    Past Surgical History:  Procedure Laterality Date  . CARDIAC CATHETERIZATION  2009  . CESAREAN SECTION  1984  . COLONOSCOPY  10-06-08   per Dr. Henrene Pastor, benign polyps, repeat in 10 yrs   . ESOPHAGOGASTRODUODENOSCOPY  05/29/2017   per Dr. Henrene Pastor, normal except slight gastritis   . HERNIA REPAIR  ~ 2007   ventral hernia repair  . THYROIDECTOMY  1990's  . TONSILLECTOMY     "when I was a kid"    There were no vitals filed for this visit.  Subjective Assessment - 09/22/17 0817    Subjective  I was really sore after the DN. I even got a "knot" under my shoulder blade. I worked all weekend so my  low back is really sore.     Currently in Pain?  Yes    Pain Score  7     Pain Location  Back    Pain Orientation  Right    Pain Descriptors / Indicators  --   Painful & sore   Aggravating Factors   Being up on my feet too long    Pain Relieving Factors  rest    Multiple Pain Sites  No                       [facility] Adult PT Treatment/Exercise - 09/22/17 0001      Lumbar Exercises: Stretches   Active Hamstring Stretch  Right;3 reps;20 seconds   added ankle pumps 10x on last 2    Single Knee to Chest Stretch  Right;3 reps;20 seconds   VC to breathe   Other Lumbar Stretch Exercise  Knee to opposite shoulder 20 sec 3x   VC to breathe     Moist Heat Therapy   Number Minutes Moist Heat  15 Minutes    Moist Heat Location  Lumbar Spine   hooklying     Electrical Stimulation   Electrical Stimulation Location  RT lumbar/SI to lateral hip  Electrical Stimulation Action  IFC    Electrical Stimulation Parameters  80-150 Hz to tolerance    Electrical Stimulation Goals  Pain      Manual Therapy   Soft tissue mobilization  RT lumbar and proximal glutes, SI area    PT Lt sidelying with pillows              PT Short Term Goals - 09/17/17 1056      PT SHORT TERM GOAL #1   Title  independent with initial HEP    Time  4    Period  Weeks    Status  New    Target Date  10/15/17      PT SHORT TERM GOAL #2   Title  pain with sitting decreased >/= 25%    Time  4    Period  Weeks    Status  New    Target Date  10/15/17      PT SHORT TERM GOAL #3   Title  standing with pain  decreased >/= 25%    Time  4    Period  Weeks    Status  New    Target Date  10/15/17        PT Long Term Goals - 09/17/17 1007      PT LONG TERM GOAL #1   Title  independent with HEP    Time  6    Period  Weeks    Status  New    Target Date  10/29/17      PT LONG TERM GOAL #2   Title  lay on right side during the night without waking up due to pain    Time  6    Period   Weeks    Status  New    Target Date  10/29/17      PT LONG TERM GOAL #3   Title  sitting for 45 minutes at the computer while doing paperwork for work with minimal to no pain    Time  6    Period  Weeks    Status  New    Target Date  10/29/17      PT LONG TERM GOAL #4   Title  walk without back hurting due to reduction in muscle spasms for 30 minutes    Time  6    Period  Weeks    Status  New    Target Date  10/29/17      PT LONG TERM GOAL #5   Title  FOTO score </= 34% limitation    Time  6    Period  Weeks    Status  New    Target Date  10/29/17            Plan - 09/22/17 0819    Clinical Impression Statement  First treatment since eval. pt presents with moderate RT lower lumbar/SI pain after working all weekend. Pt has small brusing around the lateral RT sacrum. Pt verbally reported being very sore after the initial dry needling. Soft tissues of QL felt fairly supple but proximal glutes , piriformis and lateral to sacrum were more thick and pretty tender. Used Estim at the end of session for pain reduction. Pt had done some of her initial HEP, hard to do with work.     Rehab Potential  Excellent    Clinical Impairments Affecting Rehab Potential  none    PT Frequency  2x / week  PT Duration  6 weeks    PT Treatment/Interventions  Cryotherapy;Electrical Stimulation;Iontophoresis 79m/ml Dexamethasone;Moist Heat;Traction;Ultrasound;Therapeutic exercise;Therapeutic activities;Neuromuscular re-education;Patient/family education;Manual techniques;Dry needling    PT Next Visit Plan  Pt would like to start Ionto on Wednesday. Manual work, stretching, and core stabilization. Unsure pt wants to do more DN.     PT Home Exercise Plan  Access Code: [email or web address]   Consulted and Agree with Plan of Care  Patient       Patient will benefit from skilled therapeutic intervention in order to improve the following deficits and impairments:  Pain, Increased muscle spasms, Decreased activity  tolerance, Decreased endurance, Decreased range of motion, Decreased strength  Visit Diagnosis: Acute right-sided low back pain with right-sided sciatica  Muscle weakness (generalized)  Muscle spasm of back     Problem List Patient Active Problem List   Diagnosis Date Noted  . Low back pain radiating to right leg 08/14/2017  . Super obesity 05/16/2017  . Chest pain with moderate risk for cardiac etiology 02/19/2017  . Chest pain 10/24/2011  . LIPOMA 09/12/2008  . Coronary atherosclerosis 07/30/2007  . ANEMIA-NOS 11/10/2006  . MENOPAUSAL SYNDROME 11/10/2006  . HEADACHE 11/10/2006  . Hypothyroidism 09/25/2006  . Hypercholesterolemia 09/25/2006  . Essential hypertension 09/25/2006  . ALLERGIC RHINITIS 09/25/2006  . GERD 09/25/2006    Fortune Torosian, PTA 09/22/2017, 8:44 AM  [facility] [address] [place] [address]Phone: [phone]  Fax:  [phone] Name: Nancy BarrosMRN: [id number] of Birth: [birth year]

## 2017-09-24 ENCOUNTER — Encounter: Payer: Self-pay | Admitting: Physical Therapy

## 2017-09-24 ENCOUNTER — Ambulatory Visit: Payer: 59 | Admitting: Physical Therapy

## 2017-09-24 DIAGNOSIS — M5441 Lumbago with sciatica, right side: Secondary | ICD-10-CM

## 2017-09-24 DIAGNOSIS — M62830 Muscle spasm of back: Secondary | ICD-10-CM | POA: Diagnosis not present

## 2017-09-24 DIAGNOSIS — M6281 Muscle weakness (generalized): Secondary | ICD-10-CM | POA: Diagnosis not present

## 2017-09-24 NOTE — Therapy (Signed)
[facility] [address], [place], [place], [address] Phone: [phone]   Fax:  [phone]  Physical Therapy Treatment  Patient Details  Name: Nancy [name] MRN: [id number] Date of Birth: 1955/06/13 Referring Provider: Dr. Alysia Penna   Encounter Date: 09/24/2017  PT End of Session - 09/24/17 1011    Visit Number  3    Date for PT Re-Evaluation  10/29/17    PT Start Time  0930    PT Stop Time  2703    PT Time Calculation (min)  45 min    Activity Tolerance  Patient tolerated treatment well;No increased pain    Behavior During Therapy  WFL for tasks assessed/performed       Past Medical History:  Diagnosis Date  . Allergic rhinitis    gets shots per Dr. Harold Hedge   . Anemia 2001  . Anginal pain ([place])   . COPD (chronic obstructive pulmonary disease) ([place])    "CXR just showed mild COPD" (10/24/2011)  . Coronary artery disease    had MI in 2001, see[facility] Dr. Fletcher Anon at [facility]  . GERD (gastroesophageal reflux disease)   . Gynecological examination    sees Dr. Cherylann Banas   . History of cardiac catheterization 07-24-07   showed nonconclusive disease  . Hyperlipidemia   . Hypertension   . Hypothyroidism   . Migraines    "have a history of migraines; haven't had one for years" (10/24/2011)  . Myocardial infarction [facility]) 2001?    Past Surgical History:  Procedure Laterality Date  . CARDIAC CATHETERIZATION  2009  . CESAREAN SECTION  1984  . COLONOSCOPY  10-06-08   per Dr. Henrene Pastor, benign polyps, repeat in 10 yrs   . ESOPHAGOGASTRODUODENOSCOPY  05/29/2017   per Dr. Henrene Pastor, normal except slight gastritis   . HERNIA REPAIR  ~ 2007   ventral hernia repair  . THYROIDECTOMY  1990's  . TONSILLECTOMY     "when I was a kid"    There were no vitals filed for this visit.  Subjective Assessment - 09/24/17 0933    Subjective  Pt reports that her exercises are going ok. She has some soreness right now.     Currently in Pain?  Yes    Pain Score  5     Pain Location  Back    Pain Orientation  Right;Medial    Pain Descriptors / Indicators  Sore    Pain Type  Acute pain    Pain Radiating Towards  pain Rt lateral hip sometimes down to foot    Pain Onset  More than a month ago    Pain Frequency  Intermittent    Aggravating Factors   being on my feet too long, laying on Rt side    Pain Relieving Factors  rest                       [facility] Adult PT Treatment/Exercise - 09/24/17 0001      Exercises   Exercises  Lumbar      Lumbar Exercises: Supine   Ab Set  10 reps;3 seconds    Clam  10 reps    Clam Limitations  Rt     Heel Slides  10 reps    Heel Slides Limitations  Rt    Other Supine Lumbar Exercises  Rt hip abduction/adduction slide x10 reps     Other Supine Lumbar Exercises  Rt glute massage with ball x2 min  Manual Therapy   Manual Therapy  Myofascial release    Soft tissue mobilization  STM Rt glute med, max, lateral quadriceps to the knee    Myofascial Release  trigger point release Rt piriformis with active hip ER x10 reps  Rt hip passive glute stretch 3x20 sec        Rt SKTC 2x10 sec with towel   Iontophoresis: 4 hour patch applied to Rt glute region, 1 mL dose    PT Education - 09/24/17 1023    Education Details  technique with therex; iontophoresis info    Person(s) Educated  Patient    Methods  Explanation;Verbal cues;Handout    Comprehension  Returned demonstration;Verbalized understanding       PT Short Term Goals - 09/24/17 1021      PT SHORT TERM GOAL #1   Title  independent with initial HEP    Time  4    Period  Weeks    Status  Achieved      PT SHORT TERM GOAL #2   Title  pain with sitting decreased >/= 25%    Time  4    Period  Weeks    Status  New      PT SHORT TERM GOAL #3   Title  standing with pain  decreased >/= 25%    Time  4    Period  Weeks    Status  New        PT Long Term Goals - 09/17/17 1007      PT LONG  TERM GOAL #1   Title  independent with HEP    Time  6    Period  Weeks    Status  New    Target Date  10/29/17      PT LONG TERM GOAL #2   Title  lay on right side during the night without waking up due to pain    Time  6    Period  Weeks    Status  New    Target Date  10/29/17      PT LONG TERM GOAL #3   Title  sitting for 45 minutes at the computer while doing paperwork for work with minimal to no pain    Time  6    Period  Weeks    Status  New    Target Date  10/29/17      PT LONG TERM GOAL #4   Title  walk without back hurting due to reduction in muscle spasms for 30 minutes    Time  6    Period  Weeks    Status  New    Target Date  10/29/17      PT LONG TERM GOAL #5   Title  FOTO score </= 34% limitation    Time  6    Period  Weeks    Status  New    Target Date  10/29/17            Plan - 09/24/17 1012    Clinical Impression Statement  Pt reports increased neck soreness lately with HEP adherence. Therapist reviewed technique with single knee to chest and she was able to perform with less neck strain this time. Therapist instructed pt in exercises to promote Rt hip mobility. Pt has palpable tenderness along the gluteals and along the ITB which was addressed with soft tissue mobilization end of session. Iontophoresis was explained and applied  to the pt this session and  we will see her response to this next visit.     Rehab Potential  Excellent    Clinical Impairments Affecting Rehab Potential  none    PT Frequency  2x / week    PT Duration  6 weeks    PT Treatment/Interventions  Cryotherapy;Electrical Stimulation;Iontophoresis 47m/ml Dexamethasone;Moist Heat;Traction;Ultrasound;Therapeutic exercise;Therapeutic activities;Neuromuscular re-education;Patient/family education;Manual techniques;Dry needling    PT Next Visit Plan  f/u on ionto#1,. Manual work, stretching, and core stabilization. Unsure pt wants to do more DN.     PT Home Exercise Plan  Access Code:  [email or web address]   Consulted and Agree with Plan of Care  Patient       Patient will benefit from skilled therapeutic intervention in order to improve the following deficits and impairments:  Pain, Increased muscle spasms, Decreased activity tolerance, Decreased endurance, Decreased range of motion, Decreased strength  Visit Diagnosis: Acute right-sided low back pain with right-sided sciatica  Muscle weakness (generalized)  Muscle spasm of back     Problem List Patient Active Problem List   Diagnosis Date Noted  . Low back pain radiating to right leg 08/14/2017  . Super obesity 05/16/2017  . Chest pain with moderate risk for cardiac etiology 02/19/2017  . Chest pain 10/24/2011  . LIPOMA 09/12/2008  . Coronary atherosclerosis 07/30/2007  . ANEMIA-NOS 11/10/2006  . MENOPAUSAL SYNDROME 11/10/2006  . HEADACHE 11/10/2006  . Hypothyroidism 09/25/2006  . Hypercholesterolemia 09/25/2006  . Essential hypertension 09/25/2006  . ALLERGIC RHINITIS 09/25/2006  . GERD 09/25/2006    10:24 AM,09/24/17 SSherol DadePT, DPT CPalisadeat [facility] [address] [place] [address]Phone: [phone]  Fax:  [phone] Name: KReaghan KawaMRN: [id number] of Birth: [birth year]

## 2017-09-30 ENCOUNTER — Encounter: Payer: 59 | Admitting: Physical Therapy

## 2017-10-07 ENCOUNTER — Telehealth: Payer: Self-pay | Admitting: Family Medicine

## 2017-10-07 ENCOUNTER — Ambulatory Visit (INDEPENDENT_AMBULATORY_CARE_PROVIDER_SITE_OTHER): Payer: 59 | Admitting: *Deleted

## 2017-10-07 ENCOUNTER — Ambulatory Visit: Payer: 59 | Attending: Family Medicine | Admitting: Physical Therapy

## 2017-10-07 ENCOUNTER — Encounter: Payer: Self-pay | Admitting: Physical Therapy

## 2017-10-07 DIAGNOSIS — Z23 Encounter for immunization: Secondary | ICD-10-CM | POA: Diagnosis not present

## 2017-10-07 DIAGNOSIS — M5441 Lumbago with sciatica, right side: Secondary | ICD-10-CM | POA: Insufficient documentation

## 2017-10-07 DIAGNOSIS — M62830 Muscle spasm of back: Secondary | ICD-10-CM | POA: Diagnosis not present

## 2017-10-07 DIAGNOSIS — M6281 Muscle weakness (generalized): Secondary | ICD-10-CM | POA: Diagnosis not present

## 2017-10-07 NOTE — Telephone Encounter (Signed)
Pt is in the office and would like to have a flu shot is it okay for her to get it today?

## 2017-10-07 NOTE — Patient Instructions (Signed)
Access Code: [email or web address]  URL: [email or web address]  Date: 10/07/2017  Prepared by: Elly Modena   Exercises  Seated Piriformis Stretch with Trunk Bend - 2 reps - 30 hold - 1x daily - 7x weekly  Seated Hamstring Stretch - 2 reps - 1 sets - 30 hold - 1x daily - 7x weekly  Supine Lower Trunk Rotation - 10 reps - 2 sets - 1x daily - 7x weekly  Bent Knee Fallouts - 10 reps - 3 sets - 1x daily - 7x weekly    [facility] [address] [place], [place] [address] Phone # [phone] Fax [phone]

## 2017-10-07 NOTE — Therapy (Signed)
[facility] [address] [place], [place], [address] Phone: [phone]   Fax:  [phone]  Physical Therapy Treatment  Patient Details  Name: Nancy [name] MRN: [id number] Date of Birth: 07/01/1955 Referring Provider: Dr. Alysia Penna   Encounter Date: 10/07/2017  PT End of Session - 10/07/17 0936    Visit Number  4    Date for PT Re-Evaluation  10/29/17    PT Start Time  0800    PT Stop Time  4818    PT Time Calculation (min)  57 min    Activity Tolerance  Patient tolerated treatment well;No increased pain    Behavior During Therapy  WFL for tasks assessed/performed       Past Medical History:  Diagnosis Date  . Allergic rhinitis    gets shots per Dr. Harold Hedge   . Anemia 2001  . Anginal pain ([place])   . COPD (chronic obstructive pulmonary disease) ([place])    "CXR just showed mild COPD" (10/24/2011)  . Coronary artery disease    had MI in 2001, seees Dr. Fletcher Anon at [facility]  . GERD (gastroesophageal reflux disease)   . Gynecological examination    sees Dr. Cherylann Banas   . History of cardiac catheterization 07-24-07   showed nonconclusive disease  . Hyperlipidemia   . Hypertension   . Hypothyroidism   . Migraines    "have a history of migraines; haven't had one for years" (10/24/2011)  . Myocardial infarction [facility]) 2001?    Past Surgical History:  Procedure Laterality Date  . CARDIAC CATHETERIZATION  2009  . CESAREAN SECTION  1984  . COLONOSCOPY  10-06-08   per Dr. Henrene Pastor, benign polyps, repeat in 10 yrs   . ESOPHAGOGASTRODUODENOSCOPY  05/29/2017   per Dr. Henrene Pastor, normal except slight gastritis   . HERNIA REPAIR  ~ 2007   ventral hernia repair  . THYROIDECTOMY  1990's  . TONSILLECTOMY     "when I was a kid"    There were no vitals filed for this visit.  Subjective Assessment - 10/07/17 0803    Subjective  Pt reports that she was feeling pretty good, but she had to work all weekend. This  really aggravated everything.     Currently in Pain?  Yes    Pain Score  7     Pain Location  Back    Pain Orientation  Right;Medial    Pain Descriptors / Indicators  Sore    Pain Type  Acute pain    Pain Radiating Towards  sometimes down the posteriot lateral aspect of her thigh    Pain Onset  More than a month ago    Pain Frequency  Intermittent    Aggravating Factors   being up on her feet too long at work    Pain Relieving Factors  rest                        [facility] Adult PT Treatment/Exercise - 10/07/17 0001      Exercises   Exercises  Lumbar      Lumbar Exercises: Supine   Clam  10 reps    Clam Limitations  Rt     Other Supine Lumbar Exercises  hooklying trunk rotation x10 reps Lt/Rt       Moist Heat Therapy   Number Minutes Moist Heat  15 Minutes    Moist Heat Location  Lumbar Spine      Electrical Stimulation  Electrical Stimulation Location  RT lumbar/SI to lateral hip    Electrical Stimulation Action  IFC    Electrical Stimulation Parameters  80-_0 , intensity pt tolerance    Electrical Stimulation Goals  Pain      Manual Therapy   Soft tissue mobilization  STM Rt lumbar paraspinals, Rt gluteals    Myofascial Release  trigger point release Rt piriformis; Rt hip rotation MET             PT Education - 10/07/17 0935    Education Details  importance of completing exercises after work to decrease muscle spasm and discomfort; updated HEP    Person(s) Educated  Patient    Methods  Explanation;Demonstration;Verbal cues;Handout    Comprehension  Verbalized understanding;Returned demonstration       PT Short Term Goals - 09/24/17 1021      PT SHORT TERM GOAL #1   Title  independent with initial HEP    Time  4    Period  Weeks    Status  Achieved      PT SHORT TERM GOAL #2   Title  pain with sitting decreased >/= 25%    Time  4    Period  Weeks    Status  New      PT SHORT TERM GOAL #3   Title  standing with pain  decreased >/=  25%    Time  4    Period  Weeks    Status  New        PT Long Term Goals - 09/17/17 1007      PT LONG TERM GOAL #1   Title  independent with HEP    Time  6    Period  Weeks    Status  New    Target Date  10/29/17      PT LONG TERM GOAL #2   Title  lay on right side during the night without waking up due to pain    Time  6    Period  Weeks    Status  New    Target Date  10/29/17      PT LONG TERM GOAL #3   Title  sitting for 45 minutes at the computer while doing paperwork for work with minimal to no pain    Time  6    Period  Weeks    Status  New    Target Date  10/29/17      PT LONG TERM GOAL #4   Title  walk without back hurting due to reduction in muscle spasms for 30 minutes    Time  6    Period  Weeks    Status  New    Target Date  10/29/17      PT LONG TERM GOAL #5   Title  FOTO score </= 34% limitation    Time  6    Period  Weeks    Status  New    Target Date  10/29/17            Plan - 10/07/17 0937    Clinical Impression Statement  Pt had increased Rt buttock/low back pain since her shift over the weekend. Therapist discussed stretches and mobility exercises that she can complete when getting home from work and she demonstrated good understanding of this. Also completed manual treatment to the glutes and lumbar paraspinals. Ended session with TENS and moist heat to further decrease pain. Pt reported feeling much improved following  today's session.     Rehab Potential  Excellent    Clinical Impairments Affecting Rehab Potential  none    PT Frequency  2x / week    PT Duration  6 weeks    PT Treatment/Interventions  Cryotherapy;Electrical Stimulation;Iontophoresis 59m/ml Dexamethasone;Moist Heat;Traction;Ultrasound;Therapeutic exercise;Therapeutic activities;Neuromuscular re-education;Patient/family education;Manual techniques;Dry needling    PT Next Visit Plan  Manual work, stretching, and core stabilization. Unsure pt wants to do more DN. pt liked  ionto, possibly another patch if needed    PT Home Exercise Plan  Access Code: [email or web address]    Consulted and Agree with Plan of Care  Patient       Patient will benefit from skilled therapeutic intervention in order to improve the following deficits and impairments:  Pain, Increased muscle spasms, Decreased activity tolerance, Decreased endurance, Decreased range of motion, Decreased strength  Visit Diagnosis: Acute right-sided low back pain with right-sided sciatica  Muscle weakness (generalized)  Muscle spasm of back     Problem List Patient Active Problem List   Diagnosis Date Noted  . Low back pain radiating to right leg 08/14/2017  . Super obesity 05/16/2017  . Chest pain with moderate risk for cardiac etiology 02/19/2017  . Chest pain 10/24/2011  . LIPOMA 09/12/2008  . Coronary atherosclerosis 07/30/2007  . ANEMIA-NOS 11/10/2006  . MENOPAUSAL SYNDROME 11/10/2006  . HEADACHE 11/10/2006  . Hypothyroidism 09/25/2006  . Hypercholesterolemia 09/25/2006  . Essential hypertension 09/25/2006  . ALLERGIC RHINITIS 09/25/2006  . GERD 09/25/2006   10:05 AM,10/07/17 SSherol DadePT, DPT CWoosterat [facility] [address] [place] [address]Phone: [phone]  Fax:  [phone] Name: KLindzie BoxxMRN: [id number] of Birth: 1July 27, 1957

## 2017-10-07 NOTE — Telephone Encounter (Signed)
Dr. Sarajane Jews approved that the pt could get the flu shot////ltd

## 2017-10-08 ENCOUNTER — Ambulatory Visit: Payer: 59 | Admitting: Physical Therapy

## 2017-10-08 ENCOUNTER — Encounter: Payer: Self-pay | Admitting: Physical Therapy

## 2017-10-08 DIAGNOSIS — M5441 Lumbago with sciatica, right side: Secondary | ICD-10-CM

## 2017-10-08 DIAGNOSIS — M6281 Muscle weakness (generalized): Secondary | ICD-10-CM | POA: Diagnosis not present

## 2017-10-08 DIAGNOSIS — M62830 Muscle spasm of back: Secondary | ICD-10-CM | POA: Diagnosis not present

## 2017-10-08 NOTE — Therapy (Signed)
[facility] [address] [place], [place], [address] Phone: [phone]   Fax:  [phone]  Physical Therapy Treatment  Patient Details  Name: Nancy [name]: [id number] Date of Birth: 06-28-1955 Referring Provider: Dr. Alysia Penna   Encounter Date: 10/08/2017  PT End of Session - 10/08/17 1058    Visit Number  5    Date for PT Re-Evaluation  10/29/17    PT Start Time  1059    PT Stop Time  1150    PT Time Calculation (min)  51 min    Activity Tolerance  Patient tolerated treatment well;No increased pain    Behavior During Therapy  WFL for tasks assessed/performed       Past Medical History:  Diagnosis Date  . Allergic rhinitis    gets shots per Dr. Harold Hedge   . Anemia 2001  . Anginal pain ([place])   . COPD (chronic obstructive pulmonary disease) ([place])    "CXR just showed mild COPD" (10/24/2011)  . Coronary artery disease    had MI in 2001, see[facility] Dr. Fletcher [facility] at [facility]  . GERD (gastroesophageal reflux disease)   . Gynecological examination    sees Dr. Cherylann Banas   . History of cardiac catheterization 07-24-07   showed nonconclusive disease  . Hyperlipidemia   . Hypertension   . Hypothyroidism   . Migraines    "have a history of migraines; haven't had one for years" (10/24/2011)  . Myocardial infarction [facility]) 2001?    Past Surgical History:  Procedure Laterality Date  . CARDIAC CATHETERIZATION  2009  . CESAREAN SECTION  1984  . COLONOSCOPY  10-06-08   per Dr. Henrene Pastor, benign polyps, repeat in 10 yrs   . ESOPHAGOGASTRODUODENOSCOPY  05/29/2017   per Dr. Henrene Pastor, normal except slight gastritis   . HERNIA REPAIR  ~ 2007   ventral hernia repair  . THYROIDECTOMY  1990's  . TONSILLECTOMY     "when I was a kid"    There were no vitals filed for this visit.  Subjective Assessment - 10/08/17 1101    Subjective  Was sore from the manual work, but ok in general. She is not worse.     Currently in  Pain?  Yes    Pain Location  --   Low back 7/10, RT buttocks laterally 7/10 to touch.                      [facility] Adult PT Treatment/Exercise - 10/08/17 0001      Lumbar Exercises: Stretches   Other Lumbar Stretch Exercise  Knee to opposite shoulder 20 sec 3x   VC to breathe     Lumbar Exercises: Supine   Ab Set  10 reps    Clam  10 reps   added yellow band 10x    Clam Limitations  Rt     Other Supine Lumbar Exercises  hooklying trunk rotation x10 reps Lt/Rt       Moist Heat Therapy   Number Minutes Moist Heat  15 Minutes    Moist Heat Location  Hip      Electrical Stimulation   Electrical Stimulation Location  RT lumbar/SI to lateral hip    Electrical Stimulation Action  IFC    Electrical Stimulation Parameters  80-150 HZ    Electrical Stimulation Goals  Pain      Iontophoresis   Type of Iontophoresis  Dexamethasone   #2   Location  Rt glute  Dose  1 ml    Time  4 hour patch      Manual Therapy   Soft tissue mobilization  STM Rt lumbar paraspinals, Rt gluteals    Myofascial Release  trigger point release Rt piriformis             PT Education - 10/08/17 1128    Education Details  Gave yellow band to take home and add to her clam shells.    Person(s) Educated  Patient    Methods  Explanation;Demonstration    Comprehension  Returned demonstration;Verbalized understanding       PT Short Term Goals - 09/24/17 1021      PT SHORT TERM GOAL #1   Title  independent with initial HEP    Time  4    Period  Weeks    Status  Achieved      PT SHORT TERM GOAL #2   Title  pain with sitting decreased >/= 25%    Time  4    Period  Weeks    Status  New      PT SHORT TERM GOAL #3   Title  standing with pain  decreased >/= 25%    Time  4    Period  Weeks    Status  New        PT Long Term Goals - 09/17/17 1007      PT LONG TERM GOAL #1   Title  independent with HEP    Time  6    Period  Weeks    Status  New    Target Date  10/29/17       PT LONG TERM GOAL #2   Title  lay on right side during the night without waking up due to pain    Time  6    Period  Weeks    Status  New    Target Date  10/29/17      PT LONG TERM GOAL #3   Title  sitting for 45 minutes at the computer while doing paperwork for work with minimal to no pain    Time  6    Period  Weeks    Status  New    Target Date  10/29/17      PT LONG TERM GOAL #4   Title  walk without back hurting due to reduction in muscle spasms for 30 minutes    Time  6    Period  Weeks    Status  New    Target Date  10/29/17      PT LONG TERM GOAL #5   Title  FOTO score </= 34% limitation    Time  6    Period  Weeks    Status  New    Target Date  10/29/17            Plan - 10/08/17 1131    Clinical Impression Statement  Pt compliant with HEP. She was sore after manual work but feels it is helpful to decreasing her overall pain. She is frustrated by feeling better only to have her pain exacerbate after long work hours.  Soft tissue tightness, trigger points really improving. Pt had one very specific area that was almost obturator area that was most dense and very tender. We put second ionto patch there.  Pt gets a day to maybe 2 days of relief after [place].     Rehab Potential  Excellent    Clinical  Impairments Affecting Rehab Potential  none    PT Frequency  2x / week    PT Duration  6 weeks    PT Treatment/Interventions  Cryotherapy;Electrical Stimulation;Iontophoresis 28m/ml Dexamethasone;Moist Heat;Traction;Ultrasound;Therapeutic exercise;Therapeutic activities;Neuromuscular re-education;Patient/family education;Manual techniques;Dry needling    PT Next Visit Plan  Strengthening to hip and core, flexibility of hip and lumbar, check on ionto patch and effectivness.     PT Home Exercise Plan  Access Code: [email or web address]   Consulted and Agree with Plan of Care  Patient       Patient will benefit from skilled therapeutic intervention in order to improve the  following deficits and impairments:  Pain, Increased muscle spasms, Decreased activity tolerance, Decreased endurance, Decreased range of motion, Decreased strength  Visit Diagnosis: Acute right-sided low back pain with right-sided sciatica  Muscle weakness (generalized)  Muscle spasm of back     Problem List Patient Active Problem List   Diagnosis Date Noted  . Low back pain radiating to right leg 08/14/2017  . Super obesity 05/16/2017  . Chest pain with moderate risk for cardiac etiology 02/19/2017  . Chest pain 10/24/2011  . LIPOMA 09/12/2008  . Coronary atherosclerosis 07/30/2007  . ANEMIA-NOS 11/10/2006  . MENOPAUSAL SYNDROME 11/10/2006  . HEADACHE 11/10/2006  . Hypothyroidism 09/25/2006  . Hypercholesterolemia 09/25/2006  . Essential hypertension 09/25/2006  . ALLERGIC RHINITIS 09/25/2006  . GERD 09/25/2006    Candido Flott, PTA 10/08/2017, 11:41 AM  [facility] [address] [place] [address]Phone: [phone]  Fax:  [phone] Name: KKalayla ShaddenMRN: [id number] of Birth: 1Aug 10, 1957 Access Code: [email or web address]  URL: [email or web address]  Date: 10/08/2017  Prepared by: JMyrene Galas  Exercises  Seated Piriformis Stretch with Trunk Bend - 2 reps - 30 hold - 1x daily - 7x weekly  Seated Hamstring Stretch - 2 reps - 1 sets - 30 hold - 1x daily - 7x weekly  Supine Lower Trunk Rotation - 10 reps - 2 sets - 1x daily - 7x weekly  Bent Knee Fallouts - 10 reps - 3 sets - 1x daily - 7x weekly  Supine Transversus Abdominis Bracing - Hands on Stomach - 10 reps - 5 hold - 2x daily - 7x weekly

## 2017-10-13 ENCOUNTER — Encounter: Payer: Self-pay | Admitting: Physical Therapy

## 2017-10-13 ENCOUNTER — Ambulatory Visit: Payer: 59 | Admitting: Physical Therapy

## 2017-10-13 DIAGNOSIS — M62830 Muscle spasm of back: Secondary | ICD-10-CM | POA: Diagnosis not present

## 2017-10-13 DIAGNOSIS — M6281 Muscle weakness (generalized): Secondary | ICD-10-CM | POA: Diagnosis not present

## 2017-10-13 DIAGNOSIS — M5441 Lumbago with sciatica, right side: Secondary | ICD-10-CM

## 2017-10-13 NOTE — Therapy (Addendum)
[facility] [address], [place], [place], [address] Phone: [phone]   Fax:  [phone]  Physical Therapy Treatment  Patient Details  Name: Nancy [name] MRN: [id number] Date of Birth: 1955-05-09 Referring Provider: Dr. Alysia Penna   Encounter Date: 10/13/2017    Past Medical History:  Diagnosis Date  . Allergic rhinitis    gets shots per Dr. Harold Hedge   . Anemia 2001  . Anginal pain ([place])   . COPD (chronic obstructive pulmonary disease) ([place])    "CXR just showed mild COPD" (10/24/2011)  . Coronary artery disease    had MI in 2001, seees Dr. Fletcher Anon at [facility]  . GERD (gastroesophageal reflux disease)   . Gynecological examination    sees Dr. Cherylann Banas   . History of cardiac catheterization 07-24-07   showed nonconclusive disease  . Hyperlipidemia   . Hypertension   . Hypothyroidism   . Migraines    "have a history of migraines; haven't had one for years" (10/24/2011)  . Myocardial infarction [facility]) 2001?    Past Surgical History:  Procedure Laterality Date  . CARDIAC CATHETERIZATION  2009  . CESAREAN SECTION  1984  . COLONOSCOPY  10-06-08   per Dr. Henrene Pastor, benign polyps, repeat in 10 yrs   . ESOPHAGOGASTRODUODENOSCOPY  05/29/2017   per Dr. Henrene Pastor, normal except slight gastritis   . HERNIA REPAIR  ~ 2007   ventral hernia repair  . THYROIDECTOMY  1990's  . TONSILLECTOMY     "when I was a kid"    There were no vitals filed for this visit.                              PT Short Term Goals - 10/15/17 5974      PT SHORT TERM GOAL #2   Title  pain with sitting decreased >/= 25%    Time  4    Period  Weeks    Status  Achieved   30%     PT SHORT TERM GOAL #3   Title  standing with pain  decreased >/= 25%    Time  4    Period  Weeks    Status  Achieved   30%       PT Long Term Goals - 10/15/17 1638      PT LONG TERM GOAL #1   Title  independent with HEP     Time  6    Period  Weeks    Status  On-going      PT LONG TERM GOAL #2   Title  lay on right side during the night without waking up due to pain    Time  6    Period  Weeks    Status  On-going   30 min     PT LONG TERM GOAL #4   Title  walk without back hurting due to reduction in muscle spasms for 30 minutes    Time  6    Period  Weeks    Status  Partially Met   Can do but it depends on the pace: faster pace the quicker the pan comes on, slow pace she can walk 30 min             Patient will benefit from skilled therapeutic intervention in order to improve the following deficits and impairments:  Pain, Increased muscle spasms, Decreased activity tolerance, Decreased endurance,  Decreased range of motion, Decreased strength  Visit Diagnosis: Acute right-sided low back pain with right-sided sciatica  Muscle weakness (generalized)  Muscle spasm of back     Problem List Patient Active Problem List   Diagnosis Date Noted  . Low back pain radiating to right leg 08/14/2017  . Super obesity 05/16/2017  . Chest pain with moderate risk for cardiac etiology 02/19/2017  . Chest pain 10/24/2011  . LIPOMA 09/12/2008  . Coronary atherosclerosis 07/30/2007  . ANEMIA-NOS 11/10/2006  . MENOPAUSAL SYNDROME 11/10/2006  . HEADACHE 11/10/2006  . Hypothyroidism 09/25/2006  . Hypercholesterolemia 09/25/2006  . Essential hypertension 09/25/2006  . ALLERGIC RHINITIS 09/25/2006  . GERD 09/25/2006    Christy Friede, PTA 10/15/2017, 10:52 AM  [facility] [address] [place], [place], [address] Phone: [phone]   Fax:  [phone]  Name: Nancy [name] MRN: [id number] Date of Birth: 05-24-1955  Access Code: [email or web address]  URL: [email or web address]  Date: 10/13/2017  Prepared by: Myrene Galas   Exercises  Seated Piriformis Stretch with Trunk Bend - 2 reps - 30 hold - 1x daily - 7x weekly   Seated Hamstring Stretch - 2 reps - 1 sets - 30 hold - 1x daily - 7x weekly  Supine Lower Trunk Rotation - 10 reps - 2 sets - 1x daily - 7x weekly  Bent Knee Fallouts - 10 reps - 3 sets - 1x daily - 7x weekly  Supine Transversus Abdominis Bracing - Hands on Stomach - 10 reps - 5 hold - 2x daily - 7x weekly  Supine Bridge - 10 reps - 1 sets - 5 hold - 1x daily - 7x weekly  Supine Posterior Pelvic Tilt - 10 reps - 2 sets - 5 hold - 1x daily - 7x weekly

## 2017-10-14 MED FILL — LEVOTHYROXINE 200 MCG TAB: 200 | 90 days supply | Qty: 90 | Fill #2

## 2017-10-14 MED FILL — ATORVASTATIN 80 MG TABLET: 80 | 90 days supply | Qty: 90 | Fill #1

## 2017-10-15 ENCOUNTER — Ambulatory Visit: Payer: 59 | Admitting: Physical Therapy

## 2017-10-15 DIAGNOSIS — M6281 Muscle weakness (generalized): Secondary | ICD-10-CM | POA: Diagnosis not present

## 2017-10-15 DIAGNOSIS — M5441 Lumbago with sciatica, right side: Secondary | ICD-10-CM | POA: Diagnosis not present

## 2017-10-15 DIAGNOSIS — M62830 Muscle spasm of back: Secondary | ICD-10-CM | POA: Diagnosis not present

## 2017-10-15 NOTE — Therapy (Signed)
[facility] [address] [place], [place], [address] Phone: [phone]   Fax:  [phone]  Physical Therapy Treatment  Patient Details  Name: Nancy [name] MRN: [id number] Date of Birth: 1955-08-16 Referring Provider: Dr. Alysia Penna   Encounter Date: 10/15/2017  PT End of Session - 10/15/17 0931    Visit Number  7    Date for PT Re-Evaluation  10/29/17    PT Start Time  0931    PT Stop Time  4481    PT Time Calculation (min)  64 min    Activity Tolerance  Patient tolerated treatment well;No increased pain    Behavior During Therapy  WFL for tasks assessed/performed       Past Medical History:  Diagnosis Date  . Allergic rhinitis    gets shots per Dr. Harold Hedge   . Anemia 2001  . Anginal pain ([place])   . COPD (chronic obstructive pulmonary disease) ([place])    "CXR just showed mild COPD" (10/24/2011)  . Coronary artery disease    had MI in 2001, seees Dr. Fletcher Anon at [facility]  . GERD (gastroesophageal reflux disease)   . Gynecological examination    sees Dr. Cherylann Banas   . History of cardiac catheterization 07-24-07   showed nonconclusive disease  . Hyperlipidemia   . Hypertension   . Hypothyroidism   . Migraines    "have a history of migraines; haven't had one for years" (10/24/2011)  . Myocardial infarction [facility]) 2001?    Past Surgical History:  Procedure Laterality Date  . CARDIAC CATHETERIZATION  2009  . CESAREAN SECTION  1984  . COLONOSCOPY  10-06-08   per Dr. Henrene Pastor, benign polyps, repeat in 10 yrs   . ESOPHAGOGASTRODUODENOSCOPY  05/29/2017   per Dr. Henrene Pastor, normal except slight gastritis   . HERNIA REPAIR  ~ 2007   ventral hernia repair  . THYROIDECTOMY  1990's  . TONSILLECTOMY     "when I was a kid"    There were no vitals filed for this visit.  Subjective Assessment - 10/15/17 0933    Subjective  I was really sore in my RT lateral glute/hip. Even my abs were sore. My lateral hip pain  is less frequent, 30%, and it does not go to the ankle anymore.     Currently in Pain?  Yes    Pain Score  6    Middle back and Rt hip.   Pain Orientation  Right;Lower    Aggravating Factors   Overdoing it    Pain Relieving Factors  stretching, rest, massage 2 days post    Multiple Pain Sites  No                       [facility] Adult PT Treatment/Exercise - 10/15/17 0001      Lumbar Exercises: Stretches   Pelvic Tilt  10 reps;5 seconds    Other Lumbar Stretch Exercise  knee to opposite shoulder 2 x20sec RT    lower trunk rot 2x 10 sec hold     Lumbar Exercises: Supine   Clam  --   Balls at center glute: 10x clamshells with lower ab contract   Clam Limitations  red band on clamshells 2x10    Other Supine Lumbar Exercises  Tiny steps slow, 5x bil    VC for lower ab contraction first     Knee/Hip Exercises: Aerobic   Nustep  L3 x 10 min   Concurrent PTA  review of status/goals with pt.     Knee/Hip Exercises: Standing   Hip Abduction  AROM;Stengthening;Both;2 sets;10 reps;Knee straight      Moist Heat Therapy   Number Minutes Moist Heat  --    Moist Heat Location  --      Cryotherapy   Number Minutes Cryotherapy  15 Minutes    Cryotherapy Location  Hip    Type of Cryotherapy  Ice pack      Electrical Stimulation   Electrical Stimulation Location  RT lumbar/SI to lateral hip    Electrical Stimulation Action  IFC    Electrical Stimulation Parameters  80-150 HZ    Electrical Stimulation Goals  Pain      Iontophoresis   Type of Iontophoresis  Dexamethasone   #3   Location  Rt lumbar    Dose  1 ml    Time  4-6 hr hour wear   verbalizes understanding of wear time              PT Short Term Goals - 10/15/17 9563      PT SHORT TERM GOAL #2   Title  pain with sitting decreased >/= 25%    Time  4    Period  Weeks    Status  Achieved   30%     PT SHORT TERM GOAL #3   Title  standing with pain  decreased >/= 25%    Time  4    Period  Weeks     Status  Achieved   30%       PT Long Term Goals - 10/15/17 8756      PT LONG TERM GOAL #1   Title  independent with HEP    Time  6    Period  Weeks    Status  On-going      PT LONG TERM GOAL #2   Title  lay on right side during the night without waking up due to pain    Time  6    Period  Weeks    Status  On-going   30 min     PT LONG TERM GOAL #4   Title  walk without back hurting due to reduction in muscle spasms for 30 minutes    Time  6    Period  Weeks    Status  Partially Met   Can do but it depends on the pace: faster pace the quicker the pan comes on, slow pace she can walk 30 min           Plan - 10/15/17 1006    Clinical Impression Statement  Pt has met all her STGs as of today. She reports pain reduction of 30%, 30% less pain radiating into her Rt hip and no pain down into the Rt ankle anymore.  We focused on hip/core strength and flexibility. She tolerated increase to red band for hip clamshells and was given one to take home. #3 Ionto patch was also administered today.      Rehab Potential  Excellent    Clinical Impairments Affecting Rehab Potential  none    PT Frequency  2x / week    PT Duration  6 weeks    PT Treatment/Interventions  Cryotherapy;Electrical Stimulation;Iontophoresis 37m/ml Dexamethasone;Moist Heat;Traction;Ultrasound;Therapeutic exercise;Therapeutic activities;Neuromuscular re-education;Patient/family education;Manual techniques;Dry needling    PT Next Visit Plan  Continue with RT hip strength, manual to RT hip, Estim post session to decrease post exercise soreness. #4 ionto if pt does  not come from work.     PT Home Exercise Plan  Access Code: [email or web address]    Consulted and Agree with Plan of Care  Patient       Patient will benefit from skilled therapeutic intervention in order to improve the following deficits and impairments:  Pain, Increased muscle spasms, Decreased activity tolerance, Decreased endurance, Decreased range of motion,  Decreased strength  Visit Diagnosis: Acute right-sided low back pain with right-sided sciatica  Muscle weakness (generalized)  Muscle spasm of back     Problem List Patient Active Problem List   Diagnosis Date Noted  . Low back pain radiating to right leg 08/14/2017  . Super obesity 05/16/2017  . Chest pain with moderate risk for cardiac etiology 02/19/2017  . Chest pain 10/24/2011  . LIPOMA 09/12/2008  . Coronary atherosclerosis 07/30/2007  . ANEMIA-NOS 11/10/2006  . MENOPAUSAL SYNDROME 11/10/2006  . HEADACHE 11/10/2006  . Hypothyroidism 09/25/2006  . Hypercholesterolemia 09/25/2006  . Essential hypertension 09/25/2006  . ALLERGIC RHINITIS 09/25/2006  . GERD 09/25/2006    Eureka Valdes, PTA 10/15/2017, 10:50 AM  [facility] [address], [place], [place], [address] Phone: [phone]   Fax:  [phone]  Name: Airi Copado MRN: [id number] Date of Birth: 01/08/1956

## 2017-10-16 ENCOUNTER — Other Ambulatory Visit: Payer: 59

## 2017-10-16 ENCOUNTER — Ambulatory Visit: Payer: 59 | Admitting: Family

## 2017-10-16 ENCOUNTER — Other Ambulatory Visit (HOSPITAL_BASED_OUTPATIENT_CLINIC_OR_DEPARTMENT_OTHER): Payer: 59

## 2017-10-20 ENCOUNTER — Encounter: Payer: Self-pay | Admitting: Physical Therapy

## 2017-10-20 ENCOUNTER — Ambulatory Visit: Payer: 59 | Admitting: Physical Therapy

## 2017-10-20 DIAGNOSIS — M6281 Muscle weakness (generalized): Secondary | ICD-10-CM

## 2017-10-20 DIAGNOSIS — M5441 Lumbago with sciatica, right side: Secondary | ICD-10-CM | POA: Diagnosis not present

## 2017-10-20 DIAGNOSIS — M62830 Muscle spasm of back: Secondary | ICD-10-CM

## 2017-10-20 NOTE — Therapy (Signed)
[facility] [address], [place], [place], [address] Phone: [phone]   Fax:  [phone]  Physical Therapy Treatment  Patient Details  Name: Nancy [name] MRN: [id number] Date of Birth: 1955/11/10 Referring Provider: Dr. Alysia Penna   Encounter Date: 10/20/2017  PT End of Session - 10/20/17 0804    Visit Number  8    Date for PT Re-Evaluation  10/29/17    PT Start Time  0801    PT Stop Time  0901    PT Time Calculation (min)  60 min    Activity Tolerance  Patient tolerated treatment well;No increased pain    Behavior During Therapy  WFL for tasks assessed/performed       Past Medical History:  Diagnosis Date  . Allergic rhinitis    gets shots per Dr. Harold Hedge   . Anemia 2001  . Anginal pain ([place])   . COPD (chronic obstructive pulmonary disease) ([place])    "CXR just showed mild COPD" (10/24/2011)  . Coronary artery disease    had MI in 2001, seees Dr. Fletcher Anon at [facility]  . GERD (gastroesophageal reflux disease)   . Gynecological examination    sees Dr. Cherylann Banas   . History of cardiac catheterization 07-24-07   showed nonconclusive disease  . Hyperlipidemia   . Hypertension   . Hypothyroidism   . Migraines    "have a history of migraines; haven't had one for years" (10/24/2011)  . Myocardial infarction [facility]) 2001?    Past Surgical History:  Procedure Laterality Date  . CARDIAC CATHETERIZATION  2009  . CESAREAN SECTION  1984  . COLONOSCOPY  10-06-08   per Dr. Henrene Pastor, benign polyps, repeat in 10 yrs   . ESOPHAGOGASTRODUODENOSCOPY  05/29/2017   per Dr. Henrene Pastor, normal except slight gastritis   . HERNIA REPAIR  ~ 2007   ventral hernia repair  . THYROIDECTOMY  1990's  . TONSILLECTOMY     "when I was a kid"    There were no vitals filed for this visit.  Subjective Assessment - 10/20/17 0806    Subjective  I did well after last session up until work this weekend. Work was very busy and I am  pretty sore this AM. I did a lot of stooping over the bed this weekend.     Currently in Pain?  Yes    Pain Score  8     Pain Location  --   Back and hip   Pain Orientation  Mid;Lower    Pain Descriptors / Indicators  Sore    Aggravating Factors   Stooping over the bed    Pain Relieving Factors  massage    Multiple Pain Sites  No                       [facility] Adult PT Treatment/Exercise - 10/20/17 0001      Knee/Hip Exercises: Aerobic   Nustep  L3 x 10 min   Concurrent PTA review of status/goals with pt.     Moist Heat Therapy   Number Minutes Moist Heat  15 Minutes    Moist Heat Location  Hip      Electrical Stimulation   Electrical Stimulation Location  RT lumbar/SI to lateral hip    Electrical Stimulation Action  IFC    Electrical Stimulation Parameters  80-150 HZ    Electrical Stimulation Goals  Pain      Manual Therapy   Soft tissue  mobilization  STM Rt lumbar paraspinals, Rt gluteals    Myofascial Release  trigger point release Rt piriformis               PT Short Term Goals - 10/15/17 0321      PT SHORT TERM GOAL #2   Title  pain with sitting decreased >/= 25%    Time  4    Period  Weeks    Status  Achieved   30%     PT SHORT TERM GOAL #3   Title  standing with pain  decreased >/= 25%    Time  4    Period  Weeks    Status  Achieved   30%       PT Long Term Goals - 10/15/17 2248      PT LONG TERM GOAL #1   Title  independent with HEP    Time  6    Period  Weeks    Status  On-going      PT LONG TERM GOAL #2   Title  lay on right side during the night without waking up due to pain    Time  6    Period  Weeks    Status  On-going   30 min     PT LONG TERM GOAL #4   Title  walk without back hurting due to reduction in muscle spasms for 30 minutes    Time  6    Period  Weeks    Status  Partially Met   Can do but it depends on the pace: faster pace the quicker the pan comes on, slow pace she can walk 30 min            Plan - 10/20/17 0804    Clinical Impression Statement  Pt presented to hurting quite a bit after a very busy work weekend. She reports doing a lot of stooping over the bed which she feels is the cause for her increased pain today. Session was focused on reducing her pain and associated soft tissue restrictions which there were many. Pt was mainly restricted on her Rt side from lower lumbar to distal glutes. This improved throughout the session.     Rehab Potential  Excellent    Clinical Impairments Affecting Rehab Potential  none    PT Frequency  2x / week    PT Duration  6 weeks    PT Treatment/Interventions  Cryotherapy;Electrical Stimulation;Iontophoresis 41m/ml Dexamethasone;Moist Heat;Traction;Ultrasound;Therapeutic exercise;Therapeutic activities;Neuromuscular re-education;Patient/family education;Manual techniques;Dry needling    PT Next Visit Plan  Continue with RT hip strength, manual to RT hip, Estim post session to decrease post exercise soreness. #4 ionto if pt does not come from work.     PT Home Exercise Plan  Access Code: [email or web address]   Consulted and Agree with Plan of Care  Patient       Patient will benefit from skilled therapeutic intervention in order to improve the following deficits and impairments:  Pain, Increased muscle spasms, Decreased activity tolerance, Decreased endurance, Decreased range of motion, Decreased strength  Visit Diagnosis: Acute right-sided low back pain with right-sided sciatica  Muscle weakness (generalized)  Muscle spasm of back     Problem List Patient Active Problem List   Diagnosis Date Noted  . Low back pain radiating to right leg 08/14/2017  . Super obesity 05/16/2017  . Chest pain with moderate risk for cardiac etiology 02/19/2017  . Chest pain 10/24/2011  . LIPOMA 09/12/2008  .  Coronary atherosclerosis 07/30/2007  . ANEMIA-NOS 11/10/2006  . MENOPAUSAL SYNDROME 11/10/2006  . HEADACHE 11/10/2006  . Hypothyroidism  09/25/2006  . Hypercholesterolemia 09/25/2006  . Essential hypertension 09/25/2006  . ALLERGIC RHINITIS 09/25/2006  . GERD 09/25/2006    Doyce Saling, PTA 10/20/2017, 9:33 AM  [facility] [address] [place], [place], [address] Phone: [phone]   Fax:  [phone]  Name: Nancy [name] MRN: [id number] Date of Birth: 03-04-1955

## 2017-10-28 ENCOUNTER — Ambulatory Visit: Payer: 59 | Attending: Family Medicine | Admitting: Physical Therapy

## 2017-10-28 ENCOUNTER — Encounter: Payer: Self-pay | Admitting: Physical Therapy

## 2017-10-28 DIAGNOSIS — M6281 Muscle weakness (generalized): Secondary | ICD-10-CM | POA: Insufficient documentation

## 2017-10-28 DIAGNOSIS — M5441 Lumbago with sciatica, right side: Secondary | ICD-10-CM | POA: Insufficient documentation

## 2017-10-28 DIAGNOSIS — M62830 Muscle spasm of back: Secondary | ICD-10-CM | POA: Insufficient documentation

## 2017-10-28 DIAGNOSIS — I5030 Unspecified diastolic (congestive) heart failure: Secondary | ICD-10-CM | POA: Diagnosis not present

## 2017-10-28 NOTE — Therapy (Signed)
[facility] [address] [place], [place], [address] Phone: [phone]   Fax:  [phone]  Physical Therapy Treatment  Patient Details  Name: Nancy [name] MRN: [id number] Date of Birth: [birth year] Referring Provider (PT): Dr. Alysia Penna   Encounter Date: 10/28/2017  PT End of Session - 10/28/17 0909    Visit Number  9    Date for PT Re-Evaluation  12/10/17    Authorization Type  [facility]R    PT Start Time  0845    PT Stop Time  0945    PT Time Calculation (min)  60 min    Activity Tolerance  Patient tolerated treatment well;No increased pain    Behavior During Therapy  WFL for tasks assessed/performed       Past Medical History:  Diagnosis Date  . Allergic rhinitis    gets shots per Dr. Harold Hedge   . Anemia 2001  . Anginal pain ([place])   . COPD (chronic obstructive pulmonary disease) ([place])    "CXR just showed mild COPD" (10/24/2011)  . Coronary artery disease    had MI in 2001, seees Dr. Fletcher Anon at [facility]  . GERD (gastroesophageal reflux disease)   . Gynecological examination    sees Dr. Cherylann Banas   . History of cardiac catheterization 07-24-07   showed nonconclusive disease  . Hyperlipidemia   . Hypertension   . Hypothyroidism   . Migraines    "have a history of migraines; haven't had one for years" (10/24/2011)  . Myocardial infarction [facility]) 2001?    Past Surgical History:  Procedure Laterality Date  . CARDIAC CATHETERIZATION  2009  . CESAREAN SECTION  1984  . COLONOSCOPY  10-06-08   per Dr. Henrene Pastor, benign polyps, repeat in 10 yrs   . ESOPHAGOGASTRODUODENOSCOPY  05/29/2017   per Dr. Henrene Pastor, normal except slight gastritis   . HERNIA REPAIR  ~ 2007   ventral hernia repair  . THYROIDECTOMY  1990's  . TONSILLECTOMY     "when I was a kid"    There were no vitals filed for this visit.  Subjective Assessment - 10/28/17 0851    Subjective  I feel like I am getting better with laying for longer  period of time and no pain shoooting down her right ankle. Patient can walk further before her back hurts. When cross her right leg over left it is not as tight. I am always sore from work. My back is better than it was. After work I still have issues. I am having some trouble with my left hip due to limping and started 2 weeks ago.      How long can you sit comfortably?  20 min    Patient Stated Goals  reduce pain    Currently in Pain?  Yes    Pain Score  5     Pain Location  Back   right hip   Pain Orientation  Mid;Lower;Right    Pain Descriptors / Indicators  Sore    Pain Type  Acute pain    Pain Onset  More than a month ago    Pain Frequency  Intermittent   when working pain is constant   Aggravating Factors   stopping over the bed    Pain Relieving Factors  massage    Multiple Pain Sites  No         [facility] PT Assessment - 10/28/17 0001      Assessment   Medical Diagnosis  M54.5  Low back pain radiating to right leg    Referring Provider (PT)  Dr. Alysia Penna    Onset Date/Surgical Date  07/23/17    Prior Therapy  None      Precautions   Precautions  None      Restrictions   Weight Bearing Restrictions  No      Home Environment   Living Environment  Private residence      Prior Function   Level of Independence  Independent      Cognition   Overall Cognitive Status  Within Functional Limits for tasks assessed      Observation/Other Assessments   Focus on Therapeutic Outcomes (FOTO)   53%  limitation and goal is 34% limitation      Posture/Postural Control   Posture/Postural Control  Postural limitations    Postural Limitations  Rounded Shoulders;Forward head      ROM / Strength   AROM / PROM / Strength  AROM;Strength      AROM   Lumbar Extension  decreased by 25%    Lumbar - Right Side Bend  decreased by 25%    Lumbar - Left Side Bend  full      Strength   Right Hip Flexion  5/5    Right Hip ABduction  4/5      Special Tests    Special Tests  Hip  Special Tests      Straight Leg Raise   Findings  Negative    Side   Right    Comment  45 degrees feel in her right thigh      Trendelenburg Test   Findings  Positive    Side  Left    Comments  right hip dropped                   [facility] Adult PT Treatment/Exercise - 10/28/17 0001      Exercises   Exercises  Lumbar      Lumbar Exercises: Stretches   Pelvic Tilt  10 reps;5 seconds      Lumbar Exercises: Aerobic   Recumbent Bike  10 min on level 1      Lumbar Exercises: Supine   Ab Set  10 reps;1 second   lifting heel up   AB Set Limitations  needs to palpate lower abdominals for core bracing.     Clam  10 reps    Clam Limitations  red band on clamshells 2x10    Isometric Hip Flexion  10 reps;5 seconds   VC to not hold breath     Modalities   Modalities  Electrical Stimulation;Moist Heat      Moist Heat Therapy   Number Minutes Moist Heat  15 Minutes   left sidely   Moist Heat Location  Hip      Electrical Stimulation   Electrical Stimulation Location  RT lumbar/SI to lateral hip    Electrical Stimulation Action  IFC    Electrical Stimulation Parameters  to patient tolerance; 15 min    Electrical Stimulation Goals  Pain               PT Short Term Goals - 10/15/17 0952      PT SHORT TERM GOAL #2   Title  pain with sitting decreased >/= 25%    Time  4    Period  Weeks    Status  Achieved   30%     PT SHORT TERM GOAL #3   Title  standing with pain  decreased >/= 25%    Time  4    Period  Weeks    Status  Achieved   30%       PT Long Term Goals - 10/28/17 0854      PT LONG TERM GOAL #2   Title  lay on right side during the night without waking up due to pain    Baseline  wakes up 4-5 times due to pain; lay on right hip for 2 hours now    Time  6    Period  Weeks    Status  On-going      PT LONG TERM GOAL #3   Title  sitting for 45 minutes at the computer while doing paperwork for work with minimal to no pain    Baseline   moderate back pain    Time  6    Period  Weeks    Status  On-going      PT LONG TERM GOAL #4   Title  walk without back hurting due to reduction in muscle spasms for 30 minutes    Baseline  back pain is 8-9/10 and limping    Time  6    Period  Weeks    Status  On-going      PT LONG TERM GOAL #5   Title  FOTO score </= 34% limitation    Time  6    Period  Weeks    Status  On-going            Plan - 10/28/17 0910    Clinical Impression Statement  Patient is able to lay on her right hip for 2 hours and will wake up 4-5 times per night.  Patient walks for 30 minutes then has 8-9/10 pain with limping on right leg.  Patient is not having pain down right leg and has negative straight leg raise on the right.  Patient has positive Trendelenberg on the left but not on the right.  Patient left  sidebending is full now.  Patient has increased strength in right hip abduction and flexion.  When patient crossess her right leg over the left there is less tightness in right hip . 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 Patient needs verbal cues to not move her trunk with core exercises.  Patient will benefi tfrom skilled therapy to improve core and hip strength so she is able to perform tasks with less pain.     Rehab Potential  Excellent    Clinical Impairments Affecting Rehab Potential  none    PT Frequency  2x / week    PT Duration  6 weeks    PT Treatment/Interventions  Cryotherapy;Electrical Stimulation;Iontophoresis 70m/ml Dexamethasone;Moist Heat;Traction;Ultrasound;Therapeutic exercise;Therapeutic activities;Neuromuscular re-education;Patient/family education;Manual techniques;Dry needling    PT Next Visit Plan  Continue with RT hip strength, Core strength; , Estim post session to decrease post exercise soreness. #4 ionto if pt does not come from work.     PT Home Exercise Plan  Access Code: [email or web address]   Recommended Other Services  MD signed initial summary; sent renewal  on 10/28/2017    Consulted and Agree with Plan of Care  Patient       Patient will benefit from skilled therapeutic intervention in order to improve the following deficits and impairments:  Pain, Increased muscle spasms, Decreased activity tolerance, Decreased endurance, Decreased range of motion, Decreased strength  Visit Diagnosis: Acute right-sided low back pain with  right-sided sciatica - Plan: PT plan of care cert/re-cert  Muscle weakness (generalized) - Plan: PT plan of care cert/re-cert  Muscle spasm of back - Plan: PT plan of care cert/re-cert     Problem List Patient Active Problem List   Diagnosis Date Noted  . Low back pain radiating to right leg 08/14/2017  . Super obesity 05/16/2017  . Chest pain with moderate risk for cardiac etiology 02/19/2017  . Chest pain 10/24/2011  . LIPOMA 09/12/2008  . Coronary atherosclerosis 07/30/2007  . ANEMIA-NOS 11/10/2006  . MENOPAUSAL SYNDROME 11/10/2006  . HEADACHE 11/10/2006  . Hypothyroidism 09/25/2006  . Hypercholesterolemia 09/25/2006  . Essential hypertension 09/25/2006  . ALLERGIC RHINITIS 09/25/2006  . GERD 09/25/2006    Earlie Counts, PT 10/28/17 9:31 AM    [facility] [address] [place], [place], [address] Phone: [phone]   Fax:  [phone]  Name: Zamorah Ailes MRN: [id number] Date of Birth: 09/29/1955

## 2017-11-03 ENCOUNTER — Encounter: Payer: Self-pay | Admitting: Family Medicine

## 2017-11-03 ENCOUNTER — Ambulatory Visit: Payer: 59 | Admitting: Family Medicine

## 2017-11-03 VITALS — BP 134/72 | HR 67 | Temp 98.4°F | Wt 302.5 lb

## 2017-11-03 DIAGNOSIS — M545 Low back pain, unspecified: Secondary | ICD-10-CM

## 2017-11-03 DIAGNOSIS — M79604 Pain in right leg: Secondary | ICD-10-CM

## 2017-11-03 MED ORDER — HYDROCODONE-ACETAMINOPHEN 10-325 MG PO TABS: 1.0000 | ORAL_TABLET | ORAL | 0 refills | Status: DC | PRN

## 2017-11-03 MED ORDER — MELOXICAM 15 MG PO TABS: 15.0000 mg | ORAL_TABLET | Freq: Every day | ORAL | 3 refills | Status: DC

## 2017-11-03 MED FILL — MELOXICAM 15 MG TABLET: 15 | 90 days supply | Qty: 90 | Fill #0

## 2017-11-03 MED FILL — HYDROCODON-APAP 10-325: 10-325 | 5 days supply | Qty: 30 | Fill #0
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
## 2017-11-04 ENCOUNTER — Encounter: Payer: Self-pay | Admitting: Family Medicine

## 2017-11-04 NOTE — Telephone Encounter (Signed)
Dr. Fry please advise. Thanks  

## 2017-11-05 ENCOUNTER — Encounter: Payer: Self-pay | Admitting: Physical Therapy

## 2017-11-05 ENCOUNTER — Ambulatory Visit: Payer: 59 | Admitting: Physical Therapy

## 2017-11-05 DIAGNOSIS — M5441 Lumbago with sciatica, right side: Secondary | ICD-10-CM | POA: Diagnosis not present

## 2017-11-05 DIAGNOSIS — M6281 Muscle weakness (generalized): Secondary | ICD-10-CM

## 2017-11-05 DIAGNOSIS — M62830 Muscle spasm of back: Secondary | ICD-10-CM | POA: Diagnosis not present

## 2017-11-05 DIAGNOSIS — I5030 Unspecified diastolic (congestive) heart failure: Secondary | ICD-10-CM | POA: Diagnosis not present

## 2017-11-05 NOTE — Telephone Encounter (Signed)
The referral to Neurosurgery has been sent in. As for the eye drops, these need to come from an Ophthalmologist or eye specialist

## 2017-11-05 NOTE — Therapy (Signed)
[facility] [address] [place], [place], [address] Phone: [phone]   Fax:  [phone]  Physical Therapy Treatment  Patient Details  Name: Nancy [name] MRN: [id number] Date of Birth: 05/16/1955 Referring Provider (PT): Dr. Alysia Penna   Encounter Date: 11/05/2017  PT End of Session - 11/05/17 0844    Visit Number  10    Date for PT Re-Evaluation  12/10/17    Authorization Type  [facility]R    PT Start Time  0844    PT Stop Time  0930    PT Time Calculation (min)  46 min    Activity Tolerance  Patient tolerated treatment well;No increased pain;Patient limited by pain    Behavior During Therapy  [facility] for tasks assessed/performed       Past Medical History:  Diagnosis Date  . Allergic rhinitis    gets shots per Dr. Harold Hedge   . Anemia 2001  . Anginal pain ([place])   . COPD (chronic obstructive pulmonary disease) ([place])    "CXR just showed mild COPD" (10/24/2011)  . Coronary artery disease    had MI in 2001, seees Dr. Fletcher Anon at [facility]  . GERD (gastroesophageal reflux disease)   . Gynecological examination    sees Dr. Cherylann Banas   . History of cardiac catheterization 07-24-07   showed nonconclusive disease  . Hyperlipidemia   . Hypertension   . Hypothyroidism   . Migraines    "have a history of migraines; haven't had one for years" (10/24/2011)  . Myocardial infarction [facility]) 2001?    Past Surgical History:  Procedure Laterality Date  . CARDIAC CATHETERIZATION  2009  . CESAREAN SECTION  1984  . COLONOSCOPY  10-06-08   per Dr. Henrene Pastor, benign polyps, repeat in 10 yrs   . ESOPHAGOGASTRODUODENOSCOPY  05/29/2017   per Dr. Henrene Pastor, normal except slight gastritis   . HERNIA REPAIR  ~ 2007   ventral hernia repair  . THYROIDECTOMY  1990's  . TONSILLECTOMY     "when I was a kid"    There were no vitals filed for this visit.  Subjective Assessment - 11/05/17 0845    Subjective  Bad day, in fact bed week. MD  sending me to see neuro MD for consultation and possible injection.     Currently in Pain?  Yes    Pain Score  9     Pain Location  Back    Pain Orientation  Lower;Left;Right    Pain Descriptors / Indicators  Constant;Aching;Pressure    Aggravating Factors   Work duties, being up on my feet long time    Pain Relieving Factors  massage    Multiple Pain Sites  No         [facility] PT Assessment - 11/05/17 0001      AROM   Lumbar Extension  decreased by 25%    Lumbar - Right Side Bend  decreased by 25%    Lumbar - Left Side Bend  full      Strength   Right Hip Flexion  5/5    Right Hip ABduction  4/5                   [facility] Adult PT Treatment/Exercise - 11/05/17 0001      Lumbar Exercises: Stretches   Active Hamstring Stretch  Right;Left;2 reps;20 seconds    Pelvic Tilt  10 reps;5 seconds    Other Lumbar Stretch Exercise  knee to opposite shoulder 2  x20sec RT    lower trunk rot 2x 10 sec hold     Lumbar Exercises: Supine   Clam  --   3x10, last 2 sets with red band     Manual Therapy   Soft tissue mobilization  STM Rt lumbar paraspinals, Rt gluteals,     Myofascial Release  trigger point release Rt piriformis             PT Education - 11/05/17 0930    Education Details  Gave pt info for home TENS unit purchase from [employer]    Person(s) Educated  Patient    Methods  Explanation;Handout    Comprehension  Verbalized understanding       PT Short Term Goals - 11/05/17 0934      PT SHORT TERM GOAL #1   Title  independent with initial HEP    Time  4    Period  Weeks    Status  Achieved      PT SHORT TERM GOAL #2   Title  pain with sitting decreased >/= 25%    Time  4    Period  Weeks    Status  Achieved      PT SHORT TERM GOAL #3   Title  standing with pain  decreased >/= 25%    Time  4    Period  Weeks    Status  Achieved        PT Long Term Goals - 10/28/17 4174      PT LONG TERM GOAL #2   Title  lay on right side  during the night without waking up due to pain    Baseline  wakes up 4-5 times due to pain; lay on right hip for 2 hours now    Time  6    Period  Weeks    Status  On-going      PT LONG TERM GOAL #3   Title  sitting for 45 minutes at the computer while doing paperwork for work with minimal to no pain    Baseline  moderate back pain    Time  6    Period  Weeks    Status  On-going      PT LONG TERM GOAL #4   Title  walk without back hurting due to reduction in muscle spasms for 30 minutes    Baseline  back pain is 8-9/10 and limping    Time  6    Period  Weeks    Status  On-going      PT LONG TERM GOAL #5   Title  FOTO score </= 34% limitation    Time  6    Period  Weeks    Status  On-going            Plan - 11/05/17 0844    Clinical Impression Statement  Pt has seen MD regaring her pain. She will see neuro MD for consultation  and possible injection. This week she describes as a bad week. pt was able to participate in stretching and low level core stabilization exercises without increasing her pain.  Her RT low back down through her posterior hip soft tissues are still dense and tender. This helped decrease her pain. Pt was given info on where to purchase a home TENS unit that she could use for work and home. She will await a call/appt from Neuro MD office on when she can get in,. Overall pt stays with  30% improvement since initial eval.     Rehab Potential  Excellent    Clinical Impairments Affecting Rehab Potential  none    PT Frequency  2x / week    PT Duration  6 weeks    PT Treatment/Interventions  Cryotherapy;Electrical Stimulation;Iontophoresis 41m/ml Dexamethasone;Moist Heat;Traction;Ultrasound;Therapeutic exercise;Therapeutic activities;Neuromuscular re-education;Patient/family education;Manual techniques;Dry needling    PT Next Visit Plan  Continue with RT hip strength, Core strength; , Estim post session to decrease post exercise soreness unless pt got TENS.    PT  Home Exercise Plan  Access Code: [email or web address]   Consulted and Agree with Plan of Care  Patient       Patient will benefit from skilled therapeutic intervention in order to improve the following deficits and impairments:  Pain, Increased muscle spasms, Decreased activity tolerance, Decreased endurance, Decreased range of motion, Decreased strength  Visit Diagnosis: Acute right-sided low back pain with right-sided sciatica  Muscle weakness (generalized)  Muscle spasm of back     Problem List Patient Active Problem List   Diagnosis Date Noted  . Low back pain radiating to right leg 08/14/2017  . Super obesity 05/16/2017  . Chest pain with moderate risk for cardiac etiology 02/19/2017  . Chest pain 10/24/2011  . LIPOMA 09/12/2008  . Coronary atherosclerosis 07/30/2007  . ANEMIA-NOS 11/10/2006  . MENOPAUSAL SYNDROME 11/10/2006  . HEADACHE 11/10/2006  . Hypothyroidism 09/25/2006  . Hypercholesterolemia 09/25/2006  . Essential hypertension 09/25/2006  . ALLERGIC RHINITIS 09/25/2006  . GERD 09/25/2006     JMyrene Galas PTA 11/05/17 9:36 AM   [facility] [address] [place] [address]Phone: [phone]  Fax:  [phone] Name: KTayelor OsborneMRN: [id number] of Birth: 109-16-1957

## 2017-11-12 ENCOUNTER — Ambulatory Visit: Payer: 59 | Admitting: Physical Therapy

## 2017-11-12 ENCOUNTER — Encounter: Payer: Self-pay | Admitting: Physical Therapy

## 2017-11-12 DIAGNOSIS — M62830 Muscle spasm of back: Secondary | ICD-10-CM | POA: Diagnosis not present

## 2017-11-12 DIAGNOSIS — I5030 Unspecified diastolic (congestive) heart failure: Secondary | ICD-10-CM | POA: Diagnosis not present

## 2017-11-12 DIAGNOSIS — M6281 Muscle weakness (generalized): Secondary | ICD-10-CM

## 2017-11-12 DIAGNOSIS — M5441 Lumbago with sciatica, right side: Secondary | ICD-10-CM

## 2017-11-12 NOTE — Therapy (Signed)
[facility] [address], [place], [place], [address] Phone: [phone]   Fax:  [phone]  Physical Therapy Treatment  Patient Details  Name: Nancy [name] MRN: [id number] Date of Birth: 14-Apr-1955 Referring Provider (PT): Dr. Alysia Penna   Encounter Date: 11/12/2017  PT End of Session - 11/12/17 1624    Visit Number  11    Date for PT Re-Evaluation  12/10/17    Authorization Type  UMR    PT Start Time  1615    PT Stop Time  1655    PT Time Calculation (min)  40 min    Activity Tolerance  Patient tolerated treatment well    Behavior During Therapy  [facility] for tasks assessed/performed       Past Medical History:  Diagnosis Date  . Allergic rhinitis    gets shots per Dr. Harold Hedge   . Anemia 2001  . Anginal pain ([place])   . COPD (chronic obstructive pulmonary disease) ([place])    "CXR just showed mild COPD" (10/24/2011)  . Coronary artery disease    had MI in 2001, seees Dr. Fletcher Anon at [facility]  . GERD (gastroesophageal reflux disease)   . Gynecological examination    sees Dr. Cherylann Banas   . History of cardiac catheterization 07-24-07   showed nonconclusive disease  . Hyperlipidemia   . Hypertension   . Hypothyroidism   . Migraines    "have a history of migraines; haven't had one for years" (10/24/2011)  . Myocardial infarction [facility]) 2001?    Past Surgical History:  Procedure Laterality Date  . CARDIAC CATHETERIZATION  2009  . CESAREAN SECTION  1984  . COLONOSCOPY  10-06-08   per Dr. Henrene Pastor, benign polyps, repeat in 10 yrs   . ESOPHAGOGASTRODUODENOSCOPY  05/29/2017   per Dr. Henrene Pastor, normal except slight gastritis   . HERNIA REPAIR  ~ 2007   ventral hernia repair  . THYROIDECTOMY  1990's  . TONSILLECTOMY     "when I was a kid"    There were no vitals filed for this visit.  Subjective Assessment - 11/12/17 1620    Subjective  I will be seeing the neurosurgeon with my back . The e-stim and soft  tissue work helped.     How long can you sit comfortably?  20 min    Patient Stated Goals  reduce pain    Currently in Pain?  Yes    Pain Score  9     Pain Location  Back    Pain Orientation  Lower;Left;Right    Pain Descriptors / Indicators  Constant;Aching;Pressure    Pain Radiating Towards  sometimes down the posterior lateral aspect of her thigh    Pain Onset  More than a month ago    Pain Frequency  Intermittent    Aggravating Factors   work duties, being up on my feet long time    Pain Relieving Factors  massage    Multiple Pain Sites  No         [facility] PT Assessment - 11/12/17 0001      AROM   Lumbar Extension  decreased by 25%    Lumbar - Right Side Bend  decreased by 25%    Lumbar - Left Side Bend  full      Strength   Right Hip Flexion  5/5    Right Hip ABduction  4/5  [facility] Adult PT Treatment/Exercise - 11/12/17 0001      Self-Care   Self-Care  Other Self-Care Comments    Other Self-Care Comments   educated patient on how to apply the home tens unit, how to place the electrodes, how to adjust the intensity, precautions and contraindications, put the timer on, change the mode.       Manual Therapy   Manual Therapy  Soft tissue mobilization    Soft tissue mobilization  left lumbar paraspinals and gluteal, right paraspinals             PT Education - 11/12/17 1705    Education Details  educated patient on how to use the home tens unit, don/doff electrodes, contraindications, adjust the intensity and change the timer    Person(s) Educated  Patient    Methods  Explanation;Demonstration;Verbal cues;Handout    Comprehension  Returned demonstration;Verbalized understanding       PT Short Term Goals - 11/05/17 0934      PT SHORT TERM GOAL #1   Title  independent with initial HEP    Time  4    Period  Weeks    Status  Achieved      PT SHORT TERM GOAL #2   Title  pain with sitting decreased >/= 25%    Time  4    Period   Weeks    Status  Achieved      PT SHORT TERM GOAL #3   Title  standing with pain  decreased >/= 25%    Time  4    Period  Weeks    Status  Achieved        PT Long Term Goals - 11/12/17 1709      PT LONG TERM GOAL #1   Title  independent with HEP    Time  6    Period  Weeks    Status  On-going      PT LONG TERM GOAL #2   Title  lay on right side during the night without waking up due to pain    Baseline  wakes up 4-5 times due to pain; lay on right hip for 2 hours now    Time  6    Period  Weeks    Status  On-going      PT LONG TERM GOAL #3   Title  sitting for 45 minutes at the computer while doing paperwork for work with minimal to no pain    Baseline  moderate back pain    Time  6    Period  Weeks    Status  On-going      PT LONG TERM GOAL #4   Title  walk without back hurting due to reduction in muscle spasms for 30 minutes    Baseline  back pain is 8-9/10 and limping    Time  6    Period  Weeks    Status  On-going      PT LONG TERM GOAL #5   Title  FOTO score </= 34% limitation    Time  6    Period  Weeks    Status  On-going            Plan - 11/12/17 1706    Clinical Impression Statement  Patient is going to see neurosurgeon to have her back assessed. Patient has tightness in the lumbar sacral area.  Patient understands how to adjust her home tens unit and use the settings.  Patient will benefit from skilled therapy to work with core stabilization and pain management.     Rehab Potential  Excellent    Clinical Impairments Affecting Rehab Potential  none    PT Frequency  2x / week    PT Duration  6 weeks    PT Treatment/Interventions  Cryotherapy;Electrical Stimulation;Iontophoresis 23m/ml Dexamethasone;Moist Heat;Traction;Ultrasound;Therapeutic exercise;Therapeutic activities;Neuromuscular re-education;Patient/family education;Manual techniques;Dry needling    PT Next Visit Plan  Continue with RT hip strength, Core strength; , Estim post session to  decrease post exercise soreness    PT Home Exercise Plan  Access Code: [email or web address]    Recommended Other Services  MD signed intial eval and renewal    Consulted and Agree with Plan of Care  Patient       Patient will benefit from skilled therapeutic intervention in order to improve the following deficits and impairments:  Pain, Increased muscle spasms, Decreased activity tolerance, Decreased endurance, Decreased range of motion, Decreased strength  Visit Diagnosis: Acute right-sided low back pain with right-sided sciatica  Muscle weakness (generalized)  Muscle spasm of back     Problem List Patient Active Problem List   Diagnosis Date Noted  . Low back pain radiating to right leg 08/14/2017  . Super obesity 05/16/2017  . Chest pain with moderate risk for cardiac etiology 02/19/2017  . Chest pain 10/24/2011  . LIPOMA 09/12/2008  . Coronary atherosclerosis 07/30/2007  . ANEMIA-NOS 11/10/2006  . MENOPAUSAL SYNDROME 11/10/2006  . HEADACHE 11/10/2006  . Hypothyroidism 09/25/2006  . Hypercholesterolemia 09/25/2006  . Essential hypertension 09/25/2006  . ALLERGIC RHINITIS 09/25/2006  . GERD 09/25/2006    CEarlie Counts PT 11/12/17 5:10 PM   [facility] [address] [place] [address]Phone: [phone]  Fax:  [phone] Name: Nancy RightmyerMRN: [id number] of Birth: 1February 16, 1957

## 2017-11-17 ENCOUNTER — Encounter: Payer: Self-pay | Admitting: Physical Therapy

## 2017-11-17 ENCOUNTER — Ambulatory Visit: Payer: 59 | Admitting: Physical Therapy

## 2017-11-17 DIAGNOSIS — M6281 Muscle weakness (generalized): Secondary | ICD-10-CM | POA: Diagnosis not present

## 2017-11-17 DIAGNOSIS — M5441 Lumbago with sciatica, right side: Secondary | ICD-10-CM

## 2017-11-17 DIAGNOSIS — M62830 Muscle spasm of back: Secondary | ICD-10-CM | POA: Diagnosis not present

## 2017-11-17 DIAGNOSIS — I5030 Unspecified diastolic (congestive) heart failure: Secondary | ICD-10-CM | POA: Diagnosis not present

## 2017-11-17 NOTE — Therapy (Signed)
[facility] [address] [place], [place], [address] Phone: [phone]   Fax:  [phone]  Physical Therapy Treatment  Patient Details  Name: Nancy [name] MRN: [id number] Date of Birth: 1956/01/26 Referring Provider (PT): Dr. Alysia Penna   Encounter Date: 11/17/2017  PT End of Session - 11/17/17 0756    Visit Number  12    Date for PT Re-Evaluation  12/10/17    Authorization Type  UMR    PT Start Time  0753    PT Stop Time  0850    PT Time Calculation (min)  57 min    Activity Tolerance  Patient tolerated treatment well    Behavior During Therapy  [facility] for tasks assessed/performed       Past Medical History:  Diagnosis Date  . Allergic rhinitis    gets shots per Dr. Harold Hedge   . Anemia 2001  . Anginal pain ([place])   . COPD (chronic obstructive pulmonary disease) ([place])    "CXR just showed mild COPD" (10/24/2011)  . Coronary artery disease    had MI in 2001, see[facility] Dr. Fletcher An[facility] at [facility]  . GERD (gastroesophageal reflux disease)   . Gynecological examination    sees Dr. Cherylann Banas   . History of cardiac catheterization 07-24-07   showed nonconclusive disease  . Hyperlipidemia   . Hypertension   . Hypothyroidism   . Migraines    "have a history of migraines; haven't had one for years" (10/24/2011)  . Myocardial infarction [facility]) 2001?    Past Surgical History:  Procedure Laterality Date  . CARDIAC CATHETERIZATION  2009  . CESAREAN SECTION  1984  . COLONOSCOPY  10-06-08   per Dr. Henrene Pastor, benign polyps, repeat in 10 yrs   . ESOPHAGOGASTRODUODENOSCOPY  05/29/2017   per Dr. Henrene Pastor, normal except slight gastritis   . HERNIA REPAIR  ~ 2007   ventral hernia repair  . THYROIDECTOMY  1990's  . TONSILLECTOMY     "when I was a kid"    There were no vitals filed for this visit.  Subjective Assessment - 11/17/17 0759    Subjective  I did not walk as much this weekend at work so my pain is not bad right  now. 6-7/10. Seeing MD 11/7.    Currently in Pain?  Yes    Pain Score  6     Pain Location  Back    Pain Orientation  Left;Right;Lower    Pain Descriptors / Indicators  Sore    Aggravating Factors   work duties    Pain Relieving Factors  soft tissue work    Multiple Pain Sites  No                       [facility] Adult PT Treatment/Exercise - 11/17/17 0001      Knee/Hip Exercises: Aerobic   Nustep  L3 x 10 min   Concurrent PTA review of status/goals with pt.     Moist Heat Therapy   Number Minutes Moist Heat  15 Minutes    Moist Heat Location  --   lumbar to proximal gluteals Lt sidelying with pillows bt leg     Electrical Stimulation   Electrical Stimulation Location  RT lumbar/SI to lateral hip    Electrical Stimulation Action  IFC    Electrical Stimulation Parameters  80-150 HZ    Electrical Stimulation Goals  Pain      Manual Therapy   Soft  tissue mobilization  STM Rt & LT  lumbar paraspinals, Rt gluteals > LT gluteals                PT Short Term Goals - 11/05/17 0934      PT SHORT TERM GOAL #1   Title  independent with initial HEP    Time  4    Period  Weeks    Status  Achieved      PT SHORT TERM GOAL #2   Title  pain with sitting decreased >/= 25%    Time  4    Period  Weeks    Status  Achieved      PT SHORT TERM GOAL #3   Title  standing with pain  decreased >/= 25%    Time  4    Period  Weeks    Status  Achieved        PT Long Term Goals - 11/12/17 1709      PT LONG TERM GOAL #1   Title  independent with HEP    Time  6    Period  Weeks    Status  On-going      PT LONG TERM GOAL #2   Title  lay on right side during the night without waking up due to pain    Baseline  wakes up 4-5 times due to pain; lay on right hip for 2 hours now    Time  6    Period  Weeks    Status  On-going      PT LONG TERM GOAL #3   Title  sitting for 45 minutes at the computer while doing paperwork for work with minimal to no pain    Baseline   moderate back pain    Time  6    Period  Weeks    Status  On-going      PT LONG TERM GOAL #4   Title  walk without back hurting due to reduction in muscle spasms for 30 minutes    Baseline  back pain is 8-9/10 and limping    Time  6    Period  Weeks    Status  On-going      PT LONG TERM GOAL #5   Title  FOTO score </= 34% limitation    Time  6    Period  Weeks    Status  On-going            Plan - 11/17/17 0756    Clinical Impression Statement  Pt presents today with moderate low back and hip pain ( 6-7/10). She reports getting 1- 1 1/2 days of relief from her therapy sessions. She is seeing neuro MD Nov 7th. Bilateral paraspinals tight but proximal gluteals down to piriformis ( RT>LT ) were very tender , tolerating only light effleurage at first before PTA could get deeper into the muscle. Pt requested Estim at end of session reporting it is mosteffective immediately after the soft tissue work.      Rehab Potential  Excellent    Clinical Impairments Affecting Rehab Potential  none    PT Frequency  2x / week    PT Duration  6 weeks    PT Treatment/Interventions  Cryotherapy;Electrical Stimulation;Iontophoresis 21m/ml Dexamethasone;Moist Heat;Traction;Ultrasound;Therapeutic exercise;Therapeutic activities;Neuromuscular re-education;Patient/family education;Manual techniques;Dry needling    PT Next Visit Plan  Continue with RT hip strength, Core strength; , Estim post session to decrease post exercise soreness    PT Home Exercise Plan  Access Code: [email or web address]    [email or web address] and Agree with Plan of Care  Patient       Patient will benefit from skilled therapeutic intervention in order to improve the following deficits and impairments:  Pain, Increased muscle spasms, Decreased activity tolerance, Decreased endurance, Decreased range of motion, Decreased strength  Visit Diagnosis: Acute right-sided low back pain with right-sided sciatica  Muscle weakness (generalized)  Muscle spasm  of back     Problem List Patient Active Problem List   Diagnosis Date Noted  . Low back pain radiating to right leg 08/14/2017  . Super obesity 05/16/2017  . Chest pain with moderate risk for cardiac etiology 02/19/2017  . Chest pain 10/24/2011  . LIPOMA 09/12/2008  . Coronary atherosclerosis 07/30/2007  . ANEMIA-NOS 11/10/2006  . MENOPAUSAL SYNDROME 11/10/2006  . HEADACHE 11/10/2006  . Hypothyroidism 09/25/2006  . Hypercholesterolemia 09/25/2006  . Essential hypertension 09/25/2006  . ALLERGIC RHINITIS 09/25/2006  . GERD 09/25/2006    COCHRAN,JENNIFER,PTA 11/17/2017, 8:41 AM  [facility] [address], [place], [place], [address] Phone: [phone]   Fax:  [phone]  Name: Joylene Wescott MRN: [id number] Date of Birth: 11/02/1955

## 2017-11-18 ENCOUNTER — Ambulatory Visit (INDEPENDENT_AMBULATORY_CARE_PROVIDER_SITE_OTHER): Payer: Self-pay | Admitting: Physician Assistant

## 2017-11-18 VITALS — BP 132/80 | HR 72 | Temp 98.9°F | Resp 17 | Wt 308.8 lb

## 2017-11-18 DIAGNOSIS — J069 Acute upper respiratory infection, unspecified: Secondary | ICD-10-CM

## 2017-11-18 MED ORDER — AZITHROMYCIN 250 MG PO TABS: ORAL_TABLET | ORAL | 0 refills | Status: AC

## 2017-11-18 MED FILL — AZITHROMYCIN 250 MG TABLET: 250 | 5 days supply | Qty: 6 | Fill #0

## 2017-11-18 NOTE — Progress Notes (Signed)
11/18/2017 2:46 PM   DOB: 1955/12/01 / MRN: [id number]  SUBJECTIVE:  Nancy [name] is a 62 y.o. female presenting for cough, nasal congestion, ear pain. Symptoms present for a few days now.  The problem is worsening. She has tried otc therapies. History of COPD.  Marland Kitchen  She is allergic to sulfa antibiotics and sulfamethoxazole.   She  has a past medical history of Allergic rhinitis, Anemia (2001), Anginal pain ([place]), COPD (chronic obstructive pulmonary disease) ([place]), Coronary artery disease, GERD (gastroesophageal reflux disease), Gynecological examination, History of cardiac catheterization (07-24-07), Hyperlipidemia, Hypertension, Hypothyroidism, Migraines, and Myocardial infarction ([place]) (2001?).    She  reports that she quit smoking about 11 years ago. Her smoking use included cigarettes. She has a 30.00 pack-year smoking history. She has never used smokeless tobacco. She reports that she does not drink alcohol or use drugs. She  reports that she does not engage in sexual activity. The patient  has a past surgical history that includes Cesarean section (1984); Thyroidectomy (1990's); Tonsillectomy; Hernia repair (~ 2007); Cardiac catheterization (2009); Colonoscopy (10-06-08); and Esophagogastroduodenoscopy (05/29/2017).  Her family history includes Alcohol abuse in her other; Breast cancer in her mother; COPD in her other; Cancer in her other; Colon cancer in her paternal grandfather; Coronary artery disease in her father; Diabetes in her other; Heart disease in her other; Hyperlipidemia in her other; Hypertension in her father and other; Pulmonary fibrosis in her mother; Stomach cancer in her paternal grandmother; Stroke in her other.  Review of Systems  Constitutional: Negative for diaphoresis.  Respiratory: Negative for cough, hemoptysis, sputum production, shortness of breath and wheezing.   Cardiovascular: Negative for chest pain, orthopnea and leg swelling.  Gastrointestinal: Negative for  abdominal pain, blood in stool, constipation, diarrhea, heartburn, melena, nausea and vomiting.  Genitourinary: Negative for flank pain.  Neurological: Negative for dizziness.    The problem list and medications were reviewed and updated by myself where necessary and exist elsewhere in the encounter.   OBJECTIVE:  BP 132/80 (BP Location: Right Arm, Patient Position: Sitting, Cuff Size: Large)   Pulse 72   Temp 98.9 F (37.2 C) (Oral)   Resp 17   Wt (!) 308 lb 12.8 oz (140.1 kg)   SpO2 97%   BMI 48.36 kg/m   Wt Readings from Last 3 Encounters:  11/18/17 (!) 308 lb 12.8 oz (140.1 kg)  11/03/17 (!) 302 lb 8 oz (137.2 kg)  09/09/17 291 lb 12.8 oz (132.4 kg)   Temp Readings from Last 3 Encounters:  11/18/17 98.9 F (37.2 C) (Oral)  11/03/17 98.4 F (36.9 C) (Oral)  09/09/17 98.7 F (37.1 C) (Oral)   BP Readings from Last 3 Encounters:  11/18/17 132/80  11/03/17 134/72  09/09/17 110/70   Pulse Readings from Last 3 Encounters:  11/18/17 72  11/03/17 67  09/09/17 78    Physical Exam  Constitutional: She is oriented to person, place, and time. She appears well-nourished. No distress.  Eyes: Pupils are equal, round, and reactive to light. EOM are normal.  Cardiovascular: Normal rate.  Pulmonary/Chest: Effort normal. No stridor. No respiratory distress. She has no wheezes. She has no rales. She exhibits no tenderness.  Abdominal: She exhibits no distension.  Neurological: She is alert and oriented to person, place, and time. No cranial nerve deficit. Gait normal.  Skin: Skin is dry. She is not diaphoretic.  Psychiatric: She has a normal mood and affect.  Vitals reviewed.   No results found for: HGBA1C  Lab  Results  Component Value Date   WBC 4.9 07/23/2017   HGB 13.9 07/23/2017   HCT 41.0 07/23/2017   MCV 85.7 07/23/2017   PLT 153 07/23/2017    Lab Results  Component Value Date   CREATININE 1.20 (H) 07/23/2017   BUN 18 07/23/2017   NA 143 07/23/2017   K 3.7  07/23/2017   CL 106 07/23/2017   CO2 27 06/19/2017    Lab Results  Component Value Date   ALT 30 06/19/2017   AST 23 06/19/2017   ALKPHOS 77 06/19/2017   BILITOT 0.6 06/19/2017    Lab Results  Component Value Date   TSH 0.96 06/03/2017    Lab Results  Component Value Date   CHOL 159 06/03/2017   HDL 50.80 06/03/2017   LDLCALC 77 06/03/2017   TRIG 157.0 (H) 06/03/2017   CHOLHDL 3 06/03/2017     ASSESSMENT AND PLAN:  Nancy [name] was seen today for wheezing, shortnessof breath, headache.  Diagnoses and all orders for this visit:  Acute URI Comments: Possibly bacterial, however viral or allergic seems more likely.  Zpack waiting if she does not improve with time and/or sympomatic OTC therapies.  Orders: -     azithromycin (ZITHROMAX) 250 MG tablet; Take two tabs on day on and one daily thereafter.    The patient is advised to call or return to clinic if she does not see an improvement in symptoms, or to seek the care of the closest emergency department if she worsens with the above plan.   Philis Fendt, MHS, PA-C [facility] 11/18/2017 2:46 PM

## 2017-11-18 NOTE — Patient Instructions (Signed)
Possibly bacterial, however viral or allergic seems more likely.  Zpack waiting if you do not improve with time and/or sympomatic OTC therapies.   Zyrtec, NSAID or tylenol, delsym if needed.

## 2017-11-19 ENCOUNTER — Encounter: Payer: Self-pay | Admitting: Physical Therapy

## 2017-11-19 ENCOUNTER — Ambulatory Visit: Payer: 59 | Admitting: Physical Therapy

## 2017-11-19 DIAGNOSIS — M6281 Muscle weakness (generalized): Secondary | ICD-10-CM

## 2017-11-19 DIAGNOSIS — M5441 Lumbago with sciatica, right side: Secondary | ICD-10-CM | POA: Diagnosis not present

## 2017-11-19 DIAGNOSIS — I5030 Unspecified diastolic (congestive) heart failure: Secondary | ICD-10-CM | POA: Diagnosis not present

## 2017-11-19 DIAGNOSIS — M62830 Muscle spasm of back: Secondary | ICD-10-CM

## 2017-11-19 NOTE — Therapy (Signed)
[facility] [address] [place], [place], [address] Phone: [phone]   Fax:  [phone]  Physical Therapy Treatment  Patient Details  Name: Nancy [name] MRN: [id number] Date of Birth: 1955/06/10 Referring Provider (PT): Dr. Alysia Penna   Encounter Date: 11/19/2017  PT End of Session - 11/19/17 0849    Visit Number  13    Date for PT Re-Evaluation  12/10/17    Authorization Type  UMR    PT Start Time  0847    PT Stop Time  0945    PT Time Calculation (min)  58 min    Activity Tolerance  Patient tolerated treatment well    Behavior During Therapy  [facility] for tasks assessed/performed       Past Medical History:  Diagnosis Date  . Allergic rhinitis    gets shots per Dr. Harold Hedge   . Anemia 2001  . Anginal pain ([place])   . COPD (chronic obstructive pulmonary disease) ([place])    "CXR just showed mild COPD" (10/24/2011)  . Coronary artery disease    had MI in 2001, seees Dr. Fletcher Anon at [facility]  . GERD (gastroesophageal reflux disease)   . Gynecological examination    sees Dr. Cherylann Banas   . History of cardiac catheterization 07-24-07   showed nonconclusive disease  . Hyperlipidemia   . Hypertension   . Hypothyroidism   . Migraines    "have a history of migraines; haven't had one for years" (10/24/2011)  . Myocardial infarction [facility]) 2001?    Past Surgical History:  Procedure Laterality Date  . CARDIAC CATHETERIZATION  2009  . CESAREAN SECTION  1984  . COLONOSCOPY  10-06-08   per Dr. Henrene Pastor, benign polyps, repeat in 10 yrs   . ESOPHAGOGASTRODUODENOSCOPY  05/29/2017   per Dr. Henrene Pastor, normal except slight gastritis   . HERNIA REPAIR  ~ 2007   ventral hernia repair  . THYROIDECTOMY  1990's  . TONSILLECTOMY     "when I was a kid"    There were no vitals filed for this visit.  Subjective Assessment - 11/19/17 0850    Subjective  Last few days have not been "bad." I am managing ok. Still hurts....     Currently in Pain?  Yes    Pain Score  5     Pain Location  Back    Pain Orientation  Right;Left;Lower    Pain Descriptors / Indicators  Sore    Multiple Pain Sites  No                       [facility] Adult PT Treatment/Exercise - 11/19/17 0001      Lumbar Exercises: Seated   Other Seated Lumbar Exercises  Blue physioball: red band horizontal abd 2x 10   VC for posture and core contraction     Lumbar Exercises: Supine   Clam Limitations  green band on clamshells 2x15    Bridge  10 reps;2 seconds   small ROM/ glute set with ball squeeze   Other Supine Lumbar Exercises  Dying bug arms 1# 10x, then full dead bug 1# 10x      Knee/Hip Exercises: Aerobic   Nustep  L3 x 10 min   Concurrent PTA review of status/goals with pt.     Moist Heat Therapy   Number Minutes Moist Heat  15 Minutes    Moist Heat Location  --   lumbar to proximal gluteals Lt sidelying with  pillows bt leg     Electrical Stimulation   Electrical Stimulation Location  RT lumbar/SI to lateral hip    Electrical Stimulation Action  IFC    Electrical Stimulation Parameters  80-150 HZ    Electrical Stimulation Goals  Pain      Manual Therapy   Soft tissue mobilization  STM Rt & LT  lumbar paraspinals, Rt gluteals > LT gluteals, poster/lateral RT hip.              PT Education - 11/19/17 0915    Education Details  HEP: small supine bridge/glute sets: previously issued but pt not doing.    Person(s) Educated  Patient    Methods  Explanation;Demonstration;Verbal cues;Handout    Comprehension  Returned demonstration;Verbalized understanding       PT Short Term Goals - 11/05/17 0934      PT SHORT TERM GOAL #1   Title  independent with initial HEP    Time  4    Period  Weeks    Status  Achieved      PT SHORT TERM GOAL #2   Title  pain with sitting decreased >/= 25%    Time  4    Period  Weeks    Status  Achieved      PT SHORT TERM GOAL #3   Title  standing with pain  decreased >/=  25%    Time  4    Period  Weeks    Status  Achieved        PT Long Term Goals - 11/12/17 1709      PT LONG TERM GOAL #1   Title  independent with HEP    Time  6    Period  Weeks    Status  On-going      PT LONG TERM GOAL #2   Title  lay on right side during the night without waking up due to pain    Baseline  wakes up 4-5 times due to pain; lay on right hip for 2 hours now    Time  6    Period  Weeks    Status  On-going      PT LONG TERM GOAL #3   Title  sitting for 45 minutes at the computer while doing paperwork for work with minimal to no pain    Baseline  moderate back pain    Time  6    Period  Weeks    Status  On-going      PT LONG TERM GOAL #4   Title  walk without back hurting due to reduction in muscle spasms for 30 minutes    Baseline  back pain is 8-9/10 and limping    Time  6    Period  Weeks    Status  On-going      PT LONG TERM GOAL #5   Title  FOTO score </= 34% limitation    Time  6    Period  Weeks    Status  On-going            Plan - 11/19/17 0849    Clinical Impression Statement  Pt tolerating her pain better over the last few days. Had pt try more seated core stabilization exercises today and increased resistance with the clamshell exercise. Gluteals felt good, but lateral/posterior hip was dense and tender today. Pt had not been practicing her bridge at home so we re-issued this exercise which is more of a glute  set/ small lift.     Rehab Potential  Excellent    Clinical Impairments Affecting Rehab Potential  none    PT Frequency  2x / week    PT Duration  6 weeks    PT Next Visit Plan  Continue with RT hip strength, Core strength; , Estim post session to decrease post exercise soreness    PT Home Exercise Plan  Access Code: [email or web address]    Consulted and Agree with Plan of Care  Patient       Patient will benefit from skilled therapeutic intervention in order to improve the following deficits and impairments:  Pain, Increased muscle  spasms, Decreased activity tolerance, Decreased endurance, Decreased range of motion, Decreased strength  Visit Diagnosis: Acute right-sided low back pain with right-sided sciatica  Muscle weakness (generalized)  Muscle spasm of back     Problem List Patient Active Problem List   Diagnosis Date Noted  . Low back pain radiating to right leg 08/14/2017  . Super obesity 05/16/2017  . Chest pain with moderate risk for cardiac etiology 02/19/2017  . Chest pain 10/24/2011  . LIPOMA 09/12/2008  . Coronary atherosclerosis 07/30/2007  . ANEMIA-NOS 11/10/2006  . MENOPAUSAL SYNDROME 11/10/2006  . HEADACHE 11/10/2006  . Hypothyroidism 09/25/2006  . Hypercholesterolemia 09/25/2006  . Essential hypertension 09/25/2006  . ALLERGIC RHINITIS 09/25/2006  . GERD 09/25/2006    Geordan Xu, PTA 11/19/2017, 11:00 AM  [facility] [address], [place], [place], [address] Phone: [phone]   Fax:  [phone]  Name: Tye Juarez MRN: [id number] Date of Birth: [birth year]

## 2017-11-20 ENCOUNTER — Telehealth: Payer: Self-pay

## 2017-11-20 NOTE — Telephone Encounter (Signed)
I was no able to contacted the patient.

## 2017-11-24 ENCOUNTER — Ambulatory Visit: Payer: 59 | Admitting: Physical Therapy

## 2017-11-25 ENCOUNTER — Telehealth: Payer: 59 | Admitting: Physician Assistant

## 2017-11-25 DIAGNOSIS — R05 Cough: Secondary | ICD-10-CM

## 2017-11-25 DIAGNOSIS — Z8709 Personal history of other diseases of the respiratory system: Secondary | ICD-10-CM

## 2017-11-25 DIAGNOSIS — R058 Other specified cough: Secondary | ICD-10-CM

## 2017-11-25 MED ORDER — ALBUTEROL SULFATE 108 (90 BASE) MCG/ACT IN AEPB: 1.0000 | INHALATION_SPRAY | Freq: Four times a day (QID) | RESPIRATORY_TRACT | 1 refills | Status: DC

## 2017-11-25 MED ORDER — PREDNISONE 20 MG PO TABS: 20.0000 mg | ORAL_TABLET | Freq: Every day | ORAL | 0 refills | Status: DC

## 2017-11-25 MED FILL — PROAIR RESPICLICK INHAL PWD: 108 (90 BAS | 17 days supply | Qty: 1 | Fill #0

## 2017-11-25 MED FILL — predniSONE 20 MG TABS: 20 | 5 days supply | Qty: 5 | Fill #0

## 2017-11-25 NOTE — Progress Notes (Signed)
We are sorry that you are not feeling well.  Here is how we plan to help!  Based on your presentation I believe you most likely have A cough due to a virus.  This is called viral bronchitis and is best treated by rest, plenty of fluids and control of the cough.  You may use Ibuprofen or Tylenol as directed to help your symptoms.     In addition you may use an albuterol inhaler and prednisone, both of which I am prescribing due to your history of asthma.   From your responses in the eVisit questionnaire you describe inflammation in the upper respiratory tract which is causing a significant cough.  This is commonly called Bronchitis and has four common causes:   Allergies Viral Infections Acid Reflux Bacterial Infection Allergies, viruses and acid reflux are treated by controlling symptoms or eliminating the cause. An example might be a cough caused by taking certain blood pressure medications. You stop the cough by changing the medication. Another example might be a cough caused by acid reflux. Controlling the reflux helps control the cough.  USE OF BRONCHODILATOR ("RESCUE") INHALERS: There is a risk from using your bronchodilator too frequently.  The risk is that over-reliance on a medication which only relaxes the muscles surrounding the breathing tubes can reduce the effectiveness of medications prescribed to reduce swelling and congestion of the tubes themselves.  Although you feel brief relief from the bronchodilator inhaler, your asthma may actually be worsening with the tubes becoming more swollen and filled with mucus.  This can delay other crucial treatments, such as oral steroid medications. If you need to use a bronchodilator inhaler daily, several times per day, you should discuss this with your provider.  There are probably better treatments that could be used to keep your asthma under control.     HOME CARE Only take medications as instructed by your medical team. Complete the entire  course of an antibiotic. Drink plenty of fluids and get plenty of rest. Avoid close contacts especially the very young and the elderly Cover your mouth if you cough or cough into your sleeve. Always remember to wash your hands A steam or ultrasonic humidifier can help congestion.   GET HELP RIGHT AWAY IF: You develop worsening fever. You become short of breath You cough up blood. Your symptoms persist after you have completed your treatment plan MAKE SURE YOU  Understand these instructions. Will watch your condition. Will get help right away if you are not doing well or get worse.  Your e-visit answers were reviewed by a board certified advanced clinical practitioner to complete your personal care plan.  Depending on the condition, your plan could have included both over the counter or prescription medications. If there is a problem please reply once you have received a response from your provider. Your safety is important to [place].  If you have drug allergies check your prescription carefully.    You can use MyChart to ask questions about today's visit, request a non-urgent call back, or ask for a work or school excuse for 24 hours related to this e-Visit. If it has been greater than 24 hours you will need to follow up with your provider, or enter a new e-Visit to address those concerns. You will get an e-mail in the next two days asking about your experience.  I hope that your e-visit has been valuable and will speed your recovery. Thank you for using e-visits.

## 2017-11-26 ENCOUNTER — Ambulatory Visit: Payer: 59 | Admitting: Physical Therapy

## 2017-11-26 ENCOUNTER — Encounter: Payer: Self-pay | Admitting: Physical Therapy

## 2017-11-26 DIAGNOSIS — I5030 Unspecified diastolic (congestive) heart failure: Secondary | ICD-10-CM | POA: Diagnosis not present

## 2017-11-26 DIAGNOSIS — M5441 Lumbago with sciatica, right side: Secondary | ICD-10-CM

## 2017-11-26 DIAGNOSIS — M6281 Muscle weakness (generalized): Secondary | ICD-10-CM | POA: Diagnosis not present

## 2017-11-26 DIAGNOSIS — M62830 Muscle spasm of back: Secondary | ICD-10-CM | POA: Diagnosis not present

## 2017-11-26 NOTE — Therapy (Signed)
[facility] [address], [place], [place], [address] Phone: [phone]   Fax:  [phone]  Physical Therapy Treatment  Patient Details  Name: Nancy [name] MRN: [id number] Date of Birth: [birth year] Referring Provider (PT): Dr. Alysia Penna   Encounter Date: 11/26/2017  PT End of Session - 11/26/17 0846    Visit Number  14    Date for PT Re-Evaluation  12/10/17    Authorization Type  UMR    PT Start Time  0845    PT Stop Time  0945    PT Time Calculation (min)  60 min    Activity Tolerance  Patient tolerated treatment well    Behavior During Therapy  [facility] for tasks assessed/performed       Past Medical History:  Diagnosis Date  . Allergic rhinitis    gets shots per Dr. Harold Hedge   . Anemia 2001  . Anginal pain ([place])   . COPD (chronic obstructive pulmonary disease) ([place])    "CXR just showed mild COPD" (10/24/2011)  . Coronary artery disease    had MI in 2001, seees Dr. Fletcher Anon at [facility]  . GERD (gastroesophageal reflux disease)   . Gynecological examination    sees Dr. Cherylann Banas   . History of cardiac catheterization 07-24-07   showed nonconclusive disease  . Hyperlipidemia   . Hypertension   . Hypothyroidism   . Migraines    "have a history of migraines; haven't had one for years" (10/24/2011)  . Myocardial infarction [facility]) 2001?    Past Surgical History:  Procedure Laterality Date  . CARDIAC CATHETERIZATION  2009  . CESAREAN SECTION  1984  . COLONOSCOPY  10-06-08   per Dr. Henrene Pastor, benign polyps, repeat in 10 yrs   . ESOPHAGOGASTRODUODENOSCOPY  05/29/2017   per Dr. Henrene Pastor, normal except slight gastritis   . HERNIA REPAIR  ~ 2007   ventral hernia repair  . THYROIDECTOMY  1990's  . TONSILLECTOMY     "when I was a kid"    There were no vitals filed for this visit.  Subjective Assessment - 11/26/17 0849    Subjective  Had a fever Monday and could not come to therapy. Still feels a little  tired, but no fever. When I get up out a chair I have a new pain across her proximal gluteals . This abates after 5-10 minutes.     Currently in Pain?  Yes    Pain Score  7     Pain Location  Back    Pain Orientation  Lower   and shoots down into the glutes ( when standing)   Pain Descriptors / Indicators  Tender;Shooting    Aggravating Factors   Transitioning from sit to stand    Pain Relieving Factors  soft tissue work    Multiple Pain Sites  No                       [facility] Adult PT Treatment/Exercise - 11/26/17 0001      Lumbar Exercises: Stretches   Other Lumbar Stretch Exercise  knee to opposite shoulder 2 x20sec RT    lower trunk rot 2x 10 sec hold     Lumbar Exercises: Seated   Other Seated Lumbar Exercises  Blue physioball: red band horizontal abd 2x 10   VC for posture and core contraction     Lumbar Exercises: Supine   Clam Limitations  green band on clamshells 2x15  Bridge  10 reps;2 seconds   small ROM/ glute set with ball squeeze   Other Supine Lumbar Exercises  Dying bug arms 1# 10x, then full dead bug 1# 10x   Painful today/new pain     Knee/Hip Exercises: Aerobic   Nustep  L3 x 10 min   Concurrent PTA review of status/goals with pt.     Moist Heat Therapy   Number Minutes Moist Heat  15 Minutes    Moist Heat Location  --   lumbar to proximal gluteals Lt sidelying with pillows bt leg     Electrical Stimulation   Electrical Stimulation Location  RT lumbar/SI to lateral hip    Electrical Stimulation Action  IFC    Electrical Stimulation Parameters  80-150 hZ    Electrical Stimulation Goals  Pain      Manual Therapy   Soft tissue mobilization  STM Rt & LT  lumbar paraspinals, Rt gluteals > LT gluteals, poster/lateral RT hip.                PT Short Term Goals - 11/05/17 0934      PT SHORT TERM GOAL #1   Title  independent with initial HEP    Time  4    Period  Weeks    Status  Achieved      PT SHORT TERM GOAL #2   Title   pain with sitting decreased >/= 25%    Time  4    Period  Weeks    Status  Achieved      PT SHORT TERM GOAL #3   Title  standing with pain  decreased >/= 25%    Time  4    Period  Weeks    Status  Achieved        PT Long Term Goals - 11/12/17 1709      PT LONG TERM GOAL #1   Title  independent with HEP    Time  6    Period  Weeks    Status  On-going      PT LONG TERM GOAL #2   Title  lay on right side during the night without waking up due to pain    Baseline  wakes up 4-5 times due to pain; lay on right hip for 2 hours now    Time  6    Period  Weeks    Status  On-going      PT LONG TERM GOAL #3   Title  sitting for 45 minutes at the computer while doing paperwork for work with minimal to no pain    Baseline  moderate back pain    Time  6    Period  Weeks    Status  On-going      PT LONG TERM GOAL #4   Title  walk without back hurting due to reduction in muscle spasms for 30 minutes    Baseline  back pain is 8-9/10 and limping    Time  6    Period  Weeks    Status  On-going      PT LONG TERM GOAL #5   Title  FOTO score </= 34% limitation    Time  6    Period  Weeks    Status  On-going            Plan - 11/26/17 1051    Clinical Impression Statement  Pt was sick and feverish earlier on in the week. She  isn't sure why but she describes a new pain over the last few days. This pain is primarily with transitioning from sit to stand, lasting for 5-10 minutes and typically abolishes. Pt did have more pain with th edying bug exercise today whereas she had no pain last week when she performed the exercise. Seeing Dr Ellene Route 11/7.    Rehab Potential  Excellent    Clinical Impairments Affecting Rehab Potential  none    PT Frequency  2x / week    PT Duration  6 weeks    PT Treatment/Interventions  Cryotherapy;Electrical Stimulation;Iontophoresis 5m/ml Dexamethasone;Moist Heat;Traction;Ultrasound;Therapeutic exercise;Therapeutic activities;Neuromuscular  re-education;Patient/family education;Manual techniques;Dry needling    PT Next Visit Plan  Continue with RT hip strength, Core strength; , Estim post session to decrease post exercise soreness, manual soft tissue work seems to help her for a few days.     PT Home Exercise Plan  Access Code: [email or web address]   Consulted and Agree with Plan of Care  Patient       Patient will benefit from skilled therapeutic intervention in order to improve the following deficits and impairments:  Pain, Increased muscle spasms, Decreased activity tolerance, Decreased endurance, Decreased range of motion, Decreased strength  Visit Diagnosis: Acute right-sided low back pain with right-sided sciatica  Muscle weakness (generalized)  Muscle spasm of back     Problem List Patient Active Problem List   Diagnosis Date Noted  . Low back pain radiating to right leg 08/14/2017  . Super obesity 05/16/2017  . Chest pain with moderate risk for cardiac etiology 02/19/2017  . Chest pain 10/24/2011  . LIPOMA 09/12/2008  . Coronary atherosclerosis 07/30/2007  . ANEMIA-NOS 11/10/2006  . MENOPAUSAL SYNDROME 11/10/2006  . HEADACHE 11/10/2006  . Hypothyroidism 09/25/2006  . Hypercholesterolemia 09/25/2006  . Essential hypertension 09/25/2006  . ALLERGIC RHINITIS 09/25/2006  . GERD 09/25/2006    Bergen Melle, PTA 11/26/2017, 10:56 AM  [facility] [address] [place] [address]Phone: [phone]  Fax:  [phone] Name: Nancy CarandangMRN: [id number] of Birth: 102-04-1955

## 2017-11-27 ENCOUNTER — Ambulatory Visit: Payer: Self-pay | Admitting: *Deleted

## 2017-11-27 ENCOUNTER — Encounter (HOSPITAL_COMMUNITY): Payer: Self-pay

## 2017-11-27 ENCOUNTER — Emergency Department (HOSPITAL_COMMUNITY): Payer: 59

## 2017-11-27 ENCOUNTER — Ambulatory Visit: Payer: 59 | Admitting: Family

## 2017-11-27 ENCOUNTER — Other Ambulatory Visit: Payer: Self-pay

## 2017-11-27 ENCOUNTER — Other Ambulatory Visit (INDEPENDENT_AMBULATORY_CARE_PROVIDER_SITE_OTHER): Payer: 59

## 2017-11-27 ENCOUNTER — Emergency Department (HOSPITAL_COMMUNITY)
Admission: EM | Admit: 2017-11-27 | Discharge: 2017-11-27 | Disposition: A | Discharge status: Home / Self Care | Payer: 59 | Attending: Emergency Medicine | Admitting: Emergency Medicine

## 2017-11-27 ENCOUNTER — Encounter: Payer: Self-pay | Admitting: Family

## 2017-11-27 ENCOUNTER — Ambulatory Visit (INDEPENDENT_AMBULATORY_CARE_PROVIDER_SITE_OTHER)
Admission: RE | Admit: 2017-11-27 | Discharge: 2017-11-27 | Disposition: A | Discharge status: Home / Self Care | Payer: 59 | Source: Ambulatory Visit | Attending: Family | Admitting: Family

## 2017-11-27 VITALS — BP 140/90 | HR 62 | Temp 98.3°F | Ht 67.0 in | Wt 317.1 lb

## 2017-11-27 DIAGNOSIS — I509 Heart failure, unspecified: Secondary | ICD-10-CM | POA: Diagnosis not present

## 2017-11-27 DIAGNOSIS — E039 Hypothyroidism, unspecified: Secondary | ICD-10-CM | POA: Insufficient documentation

## 2017-11-27 DIAGNOSIS — I110 Hypertensive heart disease with heart failure: Secondary | ICD-10-CM | POA: Diagnosis not present

## 2017-11-27 DIAGNOSIS — R05 Cough: Secondary | ICD-10-CM | POA: Insufficient documentation

## 2017-11-27 DIAGNOSIS — J449 Chronic obstructive pulmonary disease, unspecified: Secondary | ICD-10-CM | POA: Insufficient documentation

## 2017-11-27 DIAGNOSIS — I5030 Unspecified diastolic (congestive) heart failure: Secondary | ICD-10-CM | POA: Insufficient documentation

## 2017-11-27 DIAGNOSIS — I5031 Acute diastolic (congestive) heart failure: Secondary | ICD-10-CM

## 2017-11-27 DIAGNOSIS — Z87891 Personal history of nicotine dependence: Secondary | ICD-10-CM | POA: Insufficient documentation

## 2017-11-27 DIAGNOSIS — R319 Hematuria, unspecified: Secondary | ICD-10-CM

## 2017-11-27 DIAGNOSIS — R0600 Dyspnea, unspecified: Secondary | ICD-10-CM | POA: Diagnosis present

## 2017-11-27 DIAGNOSIS — Z7982 Long term (current) use of aspirin: Secondary | ICD-10-CM | POA: Diagnosis not present

## 2017-11-27 DIAGNOSIS — I2510 Atherosclerotic heart disease of native coronary artery without angina pectoris: Secondary | ICD-10-CM | POA: Diagnosis not present

## 2017-11-27 DIAGNOSIS — R0602 Shortness of breath: Secondary | ICD-10-CM

## 2017-11-27 DIAGNOSIS — Z79899 Other long term (current) drug therapy: Secondary | ICD-10-CM | POA: Insufficient documentation

## 2017-11-27 LAB — URINALYSIS
BILIRUBIN URINE: NEGATIVE
HGB URINE DIPSTICK: NEGATIVE
Ketones, ur: NEGATIVE
Leukocytes, UA: NEGATIVE
Nitrite: NEGATIVE
Specific Gravity, Urine: 1.02 (ref 1.000–1.030)
URINE GLUCOSE: NEGATIVE
Urobilinogen, UA: 0.2 (ref 0.0–1.0)
pH: 6 (ref 5.0–8.0)

## 2017-11-27 LAB — BASIC METABOLIC PANEL
Anion gap: 7 (ref 5–15)
BUN: 17 mg/dL (ref 6–20)
CALCIUM: 7.7 mg/dL — AB (ref 8.9–10.3)
CO2: 28 mmol/L (ref 22–32)
Chloride: 107 mmol/L (ref 98–111)
Creatinine, Ser: 1.2 mg/dL — ABNORMAL HIGH (ref 0.44–1.00)
GFR calc Af Amer: 56 mL/min — ABNORMAL LOW (ref >60)
GFR calc non Af Amer: 48 mL/min — ABNORMAL LOW (ref >60)
GLUCOSE: 133 mg/dL — AB (ref 70–99)
Potassium: 4.2 mmol/L (ref 3.5–5.1)
Sodium: 142 mmol/L (ref 135–145)

## 2017-11-27 LAB — CBC WITH DIFFERENTIAL/PLATELET
BASOS ABS: 0.1 10*3/uL (ref 0.0–0.1)
BASOS PCT: 0.7 % (ref 0.0–3.0)
Eosinophils Absolute: 0.1 10*3/uL (ref 0.0–0.7)
Eosinophils Relative: 1.8 % (ref 0.0–5.0)
HCT: 38.4 % (ref 36.0–46.0)
HEMOGLOBIN: 13.3 g/dL (ref 12.0–15.0)
LYMPHS PCT: 11.5 % — AB (ref 12.0–46.0)
Lymphs Abs: 0.9 10*3/uL (ref 0.7–4.0)
MCHC: 34.5 g/dL (ref 30.0–36.0)
MCV: 85.7 fl (ref 78.0–100.0)
MONOS PCT: 5.3 % (ref 3.0–12.0)
Monocytes Absolute: 0.4 10*3/uL (ref 0.1–1.0)
NEUTROS ABS: 6.6 10*3/uL (ref 1.4–7.7)
Neutrophils Relative %: 80.7 % — ABNORMAL HIGH (ref 43.0–77.0)
PLATELETS: 179 10*3/uL (ref 150.0–400.0)
RBC: 4.48 Mil/uL (ref 3.87–5.11)
RDW: 14.1 % (ref 11.5–15.5)
WBC: 8.1 10*3/uL (ref 4.0–10.5)

## 2017-11-27 LAB — COMPREHENSIVE METABOLIC PANEL
ALT: 99 U/L — ABNORMAL HIGH (ref 0–35)
AST: 65 U/L — ABNORMAL HIGH (ref 0–37)
Albumin: 4.1 g/dL (ref 3.5–5.2)
Alkaline Phosphatase: 99 U/L (ref 39–117)
BILIRUBIN TOTAL: 0.5 mg/dL (ref 0.2–1.2)
BUN: 19 mg/dL (ref 6–23)
CALCIUM: 7.9 mg/dL — AB (ref 8.4–10.5)
CHLORIDE: 107 meq/L (ref 96–112)
CO2: 30 meq/L (ref 19–32)
Creatinine, Ser: 1.23 mg/dL — ABNORMAL HIGH (ref 0.40–1.20)
GFR: 47.36 mL/min — ABNORMAL LOW (ref >60.00)
Glucose, Bld: 167 mg/dL — ABNORMAL HIGH (ref 70–99)
Potassium: 4.7 mEq/L (ref 3.5–5.1)
Sodium: 143 mEq/L (ref 135–145)
Total Protein: 6.6 g/dL (ref 6.0–8.3)

## 2017-11-27 LAB — CBC
HCT: 38.9 % (ref 36.0–46.0)
Hemoglobin: 12.5 g/dL (ref 12.0–15.0)
MCH: 28.5 pg (ref 26.0–34.0)
MCHC: 32.1 g/dL (ref 30.0–36.0)
MCV: 88.8 fL (ref 80.0–100.0)
PLATELETS: 174 10*3/uL (ref 150–400)
RBC: 4.38 MIL/uL (ref 3.87–5.11)
RDW: 13.6 % (ref 11.5–15.5)
WBC: 8.3 10*3/uL (ref 4.0–10.5)
nRBC: 0.2 % (ref 0.0–0.2)

## 2017-11-27 LAB — I-STAT TROPONIN, ED: Troponin i, poc: 0 ng/mL (ref 0.00–0.08)

## 2017-11-27 LAB — BRAIN NATRIURETIC PEPTIDE: Pro B Natriuretic peptide (BNP): 331 pg/mL — ABNORMAL HIGH (ref 0.0–100.0)

## 2017-11-27 LAB — D-DIMER, QUANTITATIVE: D-Dimer, Quant: 1.16 ug/mL-FEU — ABNORMAL HIGH (ref 0.00–0.50)

## 2017-11-27 MED ORDER — FUROSEMIDE 10 MG/ML IJ SOLN
40.0000 mg | Freq: Once | INTRAMUSCULAR | Status: AC
  Administered 2017-11-27: 40 mg via INTRAVENOUS
  Filled 2017-11-27: qty 4

## 2017-11-27 MED ORDER — FUROSEMIDE 20 MG PO TABS
40.0000 mg | ORAL_TABLET | Freq: Once | ORAL | Status: AC
  Administered 2017-11-27: 40 mg via ORAL
  Filled 2017-11-27: qty 2

## 2017-11-27 MED ORDER — IOPAMIDOL (ISOVUE-370) INJECTION 76%
INTRAVENOUS | Status: AC
  Filled 2017-11-27: qty 100

## 2017-11-27 MED ORDER — FUROSEMIDE 40 MG PO TABS: 40.0000 mg | ORAL_TABLET | Freq: Every day | ORAL | 0 refills | Status: DC

## 2017-11-27 MED ORDER — POTASSIUM CHLORIDE CRYS ER 20 MEQ PO TBCR: 20.0000 meq | EXTENDED_RELEASE_TABLET | Freq: Two times a day (BID) | ORAL | 0 refills | Status: DC

## 2017-11-27 MED ORDER — IOPAMIDOL (ISOVUE-370) INJECTION 76%
100.0000 mL | Freq: Once | INTRAVENOUS | Status: AC | PRN
  Administered 2017-11-27: 100 mL via INTRAVENOUS

## 2017-11-27 MED ORDER — SPIRONOLACTONE 25 MG PO TABS: 25.0000 mg | ORAL_TABLET | Freq: Every day | ORAL | 0 refills | Status: DC

## 2017-11-27 MED FILL — SPIRONOLACTONE 25 MG TABS: 25 | 6 days supply | Qty: 6 | Fill #0

## 2017-11-27 MED FILL — FUROSEMIDE 40 MG TAB: 40 | 3 days supply | Qty: 3 | Fill #0

## 2017-11-27 MED FILL — POTASSIUM CL ER 20 MEQ TABL: 20 | 15 days supply | Qty: 30 | Fill #0
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
## 2017-11-27 NOTE — ED Provider Notes (Signed)
[facility] EMERGENCY DEPARTMENT Provider Note   CSN: [id number] Arrival date & time: 11/27/17  1207     History   Chief Complaint Chief Complaint  Patient presents with  . Congestive Heart Failure    HPI Nancy [name] is a 62 y.o. female.  HPI  Dyspnea 62 yo female ho morbid obesity, copd, hypertension presents with increasing dyspnea for 1-2 weeks.  She has had a dry cough, no fever, chills, uri symptoms.  Seen at urgent care, no treatment.  Telemedicine rx's prednisone and albuterol.  Seen at her pmd today and noted increased weight by 9 pounds past week, elevated bnp, pnd, orthopnea and told to come to ed.  Some leg swelling noted-?left > right, no ho dvt or pe. Urinary frequency x 7-10 days Generalized weakness No chest pain Echo 02/20/17 Study Conclusions  - Left ventricle: The cavity size was normal. Wall thickness was   increased in a pattern of mild LVH. Systolic function was normal.   The estimated ejection fraction was in the range of 50% to 55%.   Wall motion was normal; there were no regional wall motion   abnormalities. Doppler parameters are consistent with abnormal   left ventricular relaxation (grade 1 diastolic dysfunction).   Doppler parameters are consistent with high ventricular filling   pressure. - Mitral valve: Calcified annulus.  Impressions:  - Normal LV systolic function; mild diastolic dysfunction; mild   LVH.  Past Medical History:  Diagnosis Date  . Allergic rhinitis    gets shots per Dr. Harold Hedge   . Anemia 2001  . Anginal pain ([place])   . COPD (chronic obstructive pulmonary disease) ([place])    "CXR just showed mild COPD" (10/24/2011)  . Coronary artery disease    had MI in 2001, seees Dr. Fletcher Anon at [facility]  . GERD (gastroesophageal reflux disease)   . Gynecological examination    sees Dr. Cherylann Banas   . History of cardiac catheterization 07-24-07   showed nonconclusive disease  . Hyperlipidemia   .  Hypertension   . Hypothyroidism   . Migraines    "have a history of migraines; haven't had one for years" (10/24/2011)  . Myocardial infarction [facility]) 2001?    Patient Active Problem List   Diagnosis Date Noted  . Low back pain radiating to right leg 08/14/2017  . Super obesity 05/16/2017  . Chest pain with moderate risk for cardiac etiology 02/19/2017  . Chest pain 10/24/2011  . LIPOMA 09/12/2008  . Coronary atherosclerosis 07/30/2007  . ANEMIA-NOS 11/10/2006  . MENOPAUSAL SYNDROME 11/10/2006  . HEADACHE 11/10/2006  . Hypothyroidism 09/25/2006  . Hypercholesterolemia 09/25/2006  . Essential hypertension 09/25/2006  . ALLERGIC RHINITIS 09/25/2006  . GERD 09/25/2006    Past Surgical History:  Procedure Laterality Date  . CARDIAC CATHETERIZATION  2009  . CESAREAN SECTION  1984  . COLONOSCOPY  10-06-08   per Dr. Henrene Pastor, benign polyps, repeat in 10 yrs   . ESOPHAGOGASTRODUODENOSCOPY  05/29/2017   per Dr. Henrene Pastor, normal except slight gastritis   . HERNIA REPAIR  ~ 2007   ventral hernia repair  . THYROIDECTOMY  1990's  . TONSILLECTOMY     "when I was a kid"     OB History   None      Home Medications    Prior to Admission medications   Medication Sig Start Date End Date Taking? Authorizing Provider  Albuterol Sulfate (PROAIR RESPICLICK) 937 (90 Base) MCG/ACT AEPB Inhale 1-2 puffs into the lungs every  6 (six) hours. 11/25/17   Tereasa Coop, PA-C  aspirin 81 MG tablet Take 81 mg by mouth at bedtime.     [provider]  atorvastatin (LIPITOR) 80 MG tablet Take 1 tablet (80 mg total) by mouth daily. 06/03/17   Laurey Morale, MD  Black Cohosh 40 MG CAPS Take 1 capsule by mouth daily.      [provider]  CALCIUM PO Take 1,800 mg by mouth at bedtime.    [provider]  cetirizine (ZYRTEC) 10 MG tablet Take 10 mg by mouth daily.    [provider]  cholecalciferol (VITAMIN D) 1000 units tablet Take 1,000 Units by mouth daily.    [provider]  HYDROcodone-acetaminophen (NORCO) 10-325 MG tablet Take 1 tablet by mouth every 4 (four) hours as needed for moderate pain. 11/03/17   Laurey Morale, MD  KRILL OIL PO Take 1 capsule by mouth daily.    [provider]  levothyroxine (SYNTHROID, LEVOTHROID) 200 MCG tablet Take 1 tablet (200 mcg total) by mouth daily. 02/25/17   Laurey Morale, MD  losartan (COZAAR) 100 MG tablet Take 1 tablet (100 mg total) by mouth daily. 02/25/17   Laurey Morale, MD  meloxicam (MOBIC) 15 MG tablet Take 1 tablet (15 mg total) by mouth daily. 11/03/17   Laurey Morale, MD  methocarbamol (ROBAXIN) 750 MG tablet Take 1 tablet (750 mg total) by mouth every 6 (six) hours as needed for muscle spasms. 07/30/17   Laurey Morale, MD  metoprolol tartrate (LOPRESSOR) 25 MG tablet Take 0.5 tablets (12.5 mg total) by mouth 2 (two) times daily. 03/12/17   Almyra Deforest, PA  Multiple Vitamin (MULTIVITAMIN) tablet Take 1 tablet by mouth daily.      [provider]  omeprazole (PRILOSEC) 40 MG capsule Take 1 capsule (40 mg total) by mouth 2 (two) times daily. 06/03/17   Laurey Morale, MD  phentermine 37.5 MG capsule Take 1 capsule (37.5 mg total) by mouth every morning. 06/03/17   Laurey Morale, MD  predniSONE (DELTASONE) 20 MG tablet Take 1 tablet (20 mg total) by mouth daily with breakfast. 11/25/17   Tereasa Coop, PA-C  temazepam (RESTORIL) 15 MG capsule TAKE 2 CAPSULES BY MOUTH AT BEDTIME 09/09/17   Laurey Morale, MD  traMADol (ULTRAM) 50 MG tablet Take 2 tablets (100 mg total) by mouth every 8 (eight) hours as needed for moderate pain. 09/08/15   Laurey Morale, MD  vitamin E (VITAMIN E) 400 UNIT capsule Take 400 Units by mouth daily.      [provider]    Family History Family History  Problem Relation Age of Onset  . Breast cancer Mother   . Pulmonary fibrosis Mother   . Coronary artery disease Father   . Hypertension Father   . Cancer Other        breast,colon,prostate  . Alcohol  abuse Other   . Hyperlipidemia Other   . Hypertension Other   . Stroke Other   . Heart disease Other   . COPD Other   . Diabetes Other   . Stomach cancer Paternal Grandmother   . Colon cancer Paternal Grandfather     Social History Social History   Tobacco Use  . Smoking status: Former Smoker    Packs/day: 1.00    Years: 30.00    Pack years: 30.00    Types: Cigarettes    Last attempt to quit: 10/24/2006  Years since quitting: 11.1  . Smokeless tobacco: Never Used  Substance Use Topics  . Alcohol use: No    Alcohol/week: 0.0 standard drinks  . Drug use: No     Allergies   Sulfa antibiotics and Sulfamethoxazole   Review of Systems Review of Systems  All other systems reviewed and are negative.    Physical Exam Updated Vital Signs BP (!) 161/71   Pulse 70   Temp 98.7 F (37.1 C) (Oral)   Resp 18   SpO2 98%   Physical Exam  Constitutional: She is oriented to person, place, and time. She appears well-developed and well-nourished.  HENT:  Head: Normocephalic and atraumatic.  Right Ear: External ear normal.  Left Ear: External ear normal.  Nose: Nose normal.  Mouth/Throat: Oropharynx is clear and moist.  Eyes: Pupils are equal, round, and reactive to light. EOM are normal.  Neck: Normal range of motion. Neck supple.  Cardiovascular: Normal rate, regular rhythm, normal heart sounds and intact distal pulses.  Some JVD at 45 degrees  Pulmonary/Chest: Effort normal and breath sounds normal. No respiratory distress.  Some prolonged respiratory phase  Abdominal: Soft. Bowel sounds are normal.  Musculoskeletal: Normal range of motion. She exhibits no tenderness or deformity.  Mild bilateral lower extremity swelling with trace edema  Neurological: She is alert and oriented to person, place, and time. She displays normal reflexes. Coordination normal.  Skin: Skin is warm and dry. Capillary refill takes less than 2 seconds.  Psychiatric: She has a normal mood and  affect. Her behavior is normal.  Nursing note and vitals reviewed.    ED Treatments / Results  Labs (all labs ordered are listed, but only abnormal results are displayed) Labs Reviewed  BASIC METABOLIC PANEL  CBC  I-STAT TROPONIN, ED    EKG EKG Interpretation  Date/Time:  Thursday November 27 2017 12:13:51 EDT Ventricular Rate:  70 PR Interval:  180 QRS Duration: 78 QT Interval:  416 QTC Calculation: 449 R Axis:   10 Text Interpretation:  Normal sinus rhythm Cannot rule out Anterior infarct , age undetermined Abnormal ECG No significant change since last tracing Confirmed by Pattricia Boss [id number]) on 11/27/2017 1:16:12 PM   Radiology Dg Chest 2 View  Result Date: 11/27/2017 CLINICAL DATA:  Shortness of breath, dry cough EXAM: CHEST - 2 VIEW COMPARISON:  02/19/2017 FINDINGS: Mild cardiomegaly and vascular congestion. Peribronchial thickening and interstitial prominence. No effusions or acute bony abnormality. IMPRESSION: Cardiomegaly with vascular congestion. Peribronchial thickening and interstitial prominence could reflect interstitial edema or bronchitis. Electronically Signed   By: [name] M.D.   On: 11/27/2017 11:02    Procedures Procedures (including critical care time)  Medications Ordered in ED Medications - No data to display   Initial Impression / Assessment and Plan / ED Course  I have reviewed the triage vital signs and the nursing notes.  Pertinent labs & imaging results that were available during my care of the patient were reviewed by me and considered in my medical decision making (see chart for details).     *Patient with dyspnea, weight gain, increased markings on cxr, pnd, orthopnea Given Lasix here with good diuresis. She is seen by Dr. Haroldine Laws in consultation.  Please see his complete consultation note.  Bedside echo done in ED.  He advises to start Lasix 40 mg p.o. daily with 20 mEq of potassium she should take for 2 to 3 days.  She should  then start Spironolactone 25 mg daily. CT angios performed  due to severe dyspnea and elevated d-dimer.  No evidence of pulmonary embolism noted. Discussed plan with patient and she voices understanding.  She will follow-up with heart failure clinic next week. Final Clinical Impressions(s) / ED Diagnoses   Final diagnoses:  Acute congestive heart failure, unspecified heart failure type [facility])    ED Discharge Orders    None       Pattricia Boss, MD 11/27/17 1736

## 2017-11-27 NOTE — Progress Notes (Signed)
Nancy [name] is a 62 y.o. female with the following history as recorded in EpicCare:  Patient Active Problem List   Diagnosis Date Noted  . Low back pain radiating to right leg 08/14/2017  . Super obesity 05/16/2017  . Chest pain with moderate risk for cardiac etiology 02/19/2017  . Chest pain 10/24/2011  . LIPOMA 09/12/2008  . Coronary atherosclerosis 07/30/2007  . ANEMIA-NOS 11/10/2006  . MENOPAUSAL SYNDROME 11/10/2006  . HEADACHE 11/10/2006  . Hypothyroidism 09/25/2006  . Hypercholesterolemia 09/25/2006  . Essential hypertension 09/25/2006  . ALLERGIC RHINITIS 09/25/2006  . GERD 09/25/2006    Current Outpatient Medications  Medication Sig Dispense Refill  . Albuterol Sulfate (PROAIR RESPICLICK) 790 (90 Base) MCG/ACT AEPB Inhale 1-2 puffs into the lungs every 6 (six) hours. 1 each 1  . aspirin 81 MG tablet Take 81 mg by mouth at bedtime.     Marland Kitchen atorvastatin (LIPITOR) 80 MG tablet Take 1 tablet (80 mg total) by mouth daily. 90 tablet 3  . Black Cohosh 40 MG CAPS Take 1 capsule by mouth daily.      Marland Kitchen CALCIUM PO Take 1,800 mg by mouth at bedtime.    . cetirizine (ZYRTEC) 10 MG tablet Take 10 mg by mouth daily.    . cholecalciferol (VITAMIN D) 1000 units tablet Take 1,000 Units by mouth daily.    Marland Kitchen HYDROcodone-acetaminophen (NORCO) 10-325 MG tablet Take 1 tablet by mouth every 4 (four) hours as needed for moderate pain. 30 tablet 0  . KRILL OIL PO Take 1 capsule by mouth daily.    Marland Kitchen levothyroxine (SYNTHROID, LEVOTHROID) 200 MCG tablet Take 1 tablet (200 mcg total) by mouth daily. 90 tablet 3  . losartan (COZAAR) 100 MG tablet Take 1 tablet (100 mg total) by mouth daily. 90 tablet 3  . meloxicam (MOBIC) 15 MG tablet Take 1 tablet (15 mg total) by mouth daily. 90 tablet 3  . methocarbamol (ROBAXIN) 750 MG tablet Take 1 tablet (750 mg total) by mouth every 6 (six) hours as needed for muscle spasms. 60 tablet 0  . metoprolol tartrate (LOPRESSOR) 25 MG tablet Take 0.5 tablets (12.5 mg  total) by mouth 2 (two) times daily. 90 tablet 3  . Multiple Vitamin (MULTIVITAMIN) tablet Take 1 tablet by mouth daily.      Marland Kitchen omeprazole (PRILOSEC) 40 MG capsule Take 1 capsule (40 mg total) by mouth 2 (two) times daily. 180 capsule 3  . phentermine 37.5 MG capsule Take 1 capsule (37.5 mg total) by mouth every morning. 90 capsule 1  . predniSONE (DELTASONE) 20 MG tablet Take 1 tablet (20 mg total) by mouth daily with breakfast. 5 tablet 0  . temazepam (RESTORIL) 15 MG capsule TAKE 2 CAPSULES BY MOUTH AT BEDTIME 180 capsule 1  . traMADol (ULTRAM) 50 MG tablet Take 2 tablets (100 mg total) by mouth every 8 (eight) hours as needed for moderate pain. 540 tablet 1  . vitamin E (VITAMIN E) 400 UNIT capsule Take 400 Units by mouth daily.       Current Facility-Administered Medications  Medication Dose Route Frequency Provider Last Rate Last Dose  . 0.9 %  sodium chloride infusion  500 mL Intravenous Once Irene Shipper, MD        Allergies: Sulfa antibiotics and Sulfamethoxazole  Past Medical History:  Diagnosis Date  . Allergic rhinitis    gets shots per Dr. Harold Hedge   . Anemia 2001  . Anginal pain ([place])   . COPD (chronic obstructive pulmonary disease) (  [place])    "CXR just showed mild COPD" (10/24/2011)  . Coronary artery disease    had MI in 2001, see[facility] Fletcher Anon at [facility]  . GERD (gastroesophageal reflux disease)   . Gynecological examination    sees Dr. Cherylann Banas   . History of cardiac catheterization 07-24-07   showed nonconclusive disease  . Hyperlipidemia   . Hypertension   . Hypothyroidism   . Migraines    "have a history of migraines; haven't had one for years" (10/24/2011)  . Myocardial infarction [facility]) 2001?    Past Surgical History:  Procedure Laterality Date  . CARDIAC CATHETERIZATION  2009  . CESAREAN SECTION  1984  . COLONOSCOPY  10-06-08   per Dr. Henrene Pastor, benign polyps, repeat in 10 yrs   . ESOPHAGOGASTRODUODENOSCOPY  05/29/2017   per Dr. Henrene Pastor, normal except  slight gastritis   . HERNIA REPAIR  ~ 2007   ventral hernia repair  . THYROIDECTOMY  1990's  . TONSILLECTOMY     "when I was a kid"    Family History  Problem Relation Age of Onset  . Breast cancer Mother   . Pulmonary fibrosis Mother   . Coronary artery disease Father   . Hypertension Father   . Cancer Other        breast,colon,prostate  . Alcohol abuse Other   . Hyperlipidemia Other   . Hypertension Other   . Stroke Other   . Heart disease Other   . COPD Other   . Diabetes Other   . Stomach cancer Paternal Grandmother   . Colon cancer Paternal Grandfather     Social History   Tobacco Use  . Smoking status: Former Smoker    Packs/day: 1.00    Years: 30.00    Pack years: 30.00    Types: Cigarettes    Last attempt to quit: 10/24/2006    Years since quitting: 11.1  . Smokeless tobacco: Never Used  Substance Use Topics  . Alcohol use: No    Alcohol/week: 0.0 standard drinks    Subjective:  Patient presents today with worsening sensation of shortness of breath; has had dry cough x 1 week; was seen at [facility] last week with these symptoms- no EKG or CXR done; thought that was viral- given Rx for Z-pak to hold and fill only if needed; called in with e-visit on Monday- started on Prednisone 20 mg and albuterol; finds that she is having to use the albuterol more frequently in the past few days; notes that her weight is up 9 pounds in the past week; no prior history of CHF; feels that her left leg is more swollen than her right; also mentions that she has been having some blood in her urine recently; denies any fever or burning on urination.     Objective:  Vitals:   11/27/17 0958  BP: 140/90  Pulse: 62  Temp: 98.3 F (36.8 C)  TempSrc: Oral  SpO2: 95%  Weight: (!) 317 lb 1.9 oz (143.8 kg)  Height: _0  (1.702 m)    General: Well developed, well nourished, in no acute distress  Skin : Warm and dry.  Head: Normocephalic and atraumatic  Eyes: Sclera and conjunctiva clear;  pupils round and reactive to light; extraocular movements intact  Ears: External normal; canals clear; tympanic membranes normal  Oropharynx: Pink, supple. No suspicious lesions  Neck: Supple without thyromegaly, adenopathy  Lungs: Respirations unlabored; wheezing noted in lower lobes  CVS exam: normal rate and regular rhythm.  Musculoskeletal:  No deformities; no active joint inflammation  Extremities: No pitting edema, cyanosis, clubbing  Vessels: Symmetric bilaterally  Neurologic: Alert and oriented; speech intact; face symmetrical; moves all extremities well; CNII-XII intact without focal deficit  Assessment:  1. Shortness of breath   2. Hematuria, unspecified type     Plan:  Symptoms are concerning for more acute process that acute bronchitis; need to evaluate for acute CHF with unexplained 9 pound weight gain; update EKG- no acute ischemic changes seen; update CXR today; will update labs to include CBC, CMP, TSH, BNP, D-dimer; strict ER precautions as she understands I may not be able to get all of these lab results back today; follow-up to be determined.   No follow-ups on file.  Orders Placed This Encounter  Procedures  . DG Chest 2 View    Standing Status:   Future    Standing Expiration Date:   01/28/2019    Order Specific Question:   Reason for Exam (SYMPTOM  OR DIAGNOSIS REQUIRED)    Answer:   shortness of breath    Order Specific Question:   Is patient pregnant?    Answer:   No    Order Specific Question:   Preferred imaging location?    Answer:   [facility]    Order Specific Question:   Radiology Contrast Protocol - do NOT remove file path    Answer:   [email or web address]  . CBC w/Diff    Standing Status:   Future    Number of Occurrences:   1    Standing Expiration Date:   11/27/2018  . Comp Met (CMET)    Standing Status:   Future    Number of Occurrences:   1    Standing Expiration Date:   11/27/2018  . D-Dimer,  Quantitative    Standing Status:   Future    Number of Occurrences:   1    Standing Expiration Date:   11/27/2018  . B Nat Peptide    Standing Status:   Future    Number of Occurrences:   1    Standing Expiration Date:   11/27/2018  . Urinalysis    Standing Status:   Future    Number of Occurrences:   1    Standing Expiration Date:   11/27/2018  . EKG 12-Lead    Requested Prescriptions    No prescriptions requested or ordered in this encounter

## 2017-11-27 NOTE — ED Notes (Signed)
Pt returned from imaging.

## 2017-11-27 NOTE — ED Notes (Signed)
Patient transported to CT 

## 2017-11-27 NOTE — ED Notes (Signed)
Pt stable, ambulatory, states understanding of discharge instructions 

## 2017-11-27 NOTE — ED Triage Notes (Signed)
Pt presents for evaluation of CHF. Pt has hx of MI, has been having SOB and leg swelling. BNP was elevated at 313. Sent by PCP here.

## 2017-11-27 NOTE — Telephone Encounter (Signed)
Experiencing increased SOB with a dry non productive cough for about one week. E-visit on 11/25/17 was prescribed prednisone 20 MG and albuterol inhaler which is using Q4-6 hours with no improvement. Appointment made for 10:00a this morning with Jodi Mourning, NP. Reason for Disposition . [1] MILD difficulty breathing (e.g., minimal/no SOB at rest, SOB with walking, pulse <100) AND [2] NEW-onset or WORSE than normal  Answer Assessment - Initial Assessment Questions 1. RESPIRATORY STATUS: "Describe your breathing?" (e.g., wheezing, shortness of breath, unable to speak, severe coughing)      Short of breath with walking. 2. ONSET: "When did this breathing problem begin?"      About one week ago. 3. PATTERN "Does the difficult breathing come and go, or has it been constant since it started?"      Constant. Worse with walking. Keeping her up. 4. SEVERITY: "How bad is your breathing?" (e.g., mild, moderate, severe)    - MILD: No SOB at rest, mild SOB with walking, speaks normally in sentences, can lay down, no retractions, pulse < 100.    - MODERATE: SOB at rest, SOB with minimal exertion and prefers to sit, cannot lie down flat, speaks in phrases, mild retractions, audible wheezing, pulse 100-120.    - SEVERE: Very SOB at rest, speaks in single words, struggling to breathe, sitting hunched forward, retractions, pulse > 120      Moderate. 5. RECURRENT SYMPTOM: "Have you had difficulty breathing before?" If so, ask: "When was the last time?" and "What happened that time?"      no 6. CARDIAC HISTORY: "Do you have any history of heart disease?" (e.g., heart attack, angina, bypass surgery, angioplasty)      MI  20 years ago. HTN 7. LUNG HISTORY: "Do you have any history of lung disease?"  (e.g., pulmonary embolus, asthma, emphysema)     Allergies with inhaler that has expired. 8. CAUSE: "What do you think is causing the breathing problem?"      illness 9. OTHER SYMPTOMS: "Do you have any other  symptoms? (e.g., dizziness, runny nose, cough, chest pain, fever)     Low grade fever around 100 for last days. 10. PREGNANCY: "Is there any chance you are pregnant?" "When was your last menstrual period?"       no 11. TRAVEL: "Have you traveled out of the country in the last month?" (e.g., travel history, exposures)       no  Protocols used: BREATHING DIFFICULTY-A-AH

## 2017-11-27 NOTE — Discharge Instructions (Addendum)
Please follow up at CHF clinic as discussed with Dr. Sung Amabile. Return if you are worse at any time.

## 2017-11-28 LAB — D-DIMER, QUANTITATIVE: D-Dimer, Quant: 1.15 mcg/mL FEU — ABNORMAL HIGH (ref <0.50)

## 2017-11-28 NOTE — Progress Notes (Signed)
Seen at ER; negative CT angio done yesterday; will be seen in CHF clinic in follow-up.

## 2017-12-01 ENCOUNTER — Ambulatory Visit: Payer: 59 | Attending: Family Medicine | Admitting: Physical Therapy

## 2017-12-01 ENCOUNTER — Telehealth: Payer: Self-pay | Admitting: Physical Therapy

## 2017-12-01 ENCOUNTER — Encounter: Payer: Self-pay | Admitting: Family Medicine

## 2017-12-01 DIAGNOSIS — M5441 Lumbago with sciatica, right side: Secondary | ICD-10-CM | POA: Insufficient documentation

## 2017-12-01 DIAGNOSIS — M62830 Muscle spasm of back: Secondary | ICD-10-CM | POA: Insufficient documentation

## 2017-12-01 DIAGNOSIS — M6281 Muscle weakness (generalized): Secondary | ICD-10-CM | POA: Insufficient documentation

## 2017-12-01 NOTE — Telephone Encounter (Signed)
Called patient at 8:24 about her 8 appointment she missed. She thought her appointment was at 8:45. Patient is going to look at her calendar to see when she can reschedule.  Earlie Counts, PT @11 /04/2017@ 8:28 AM

## 2017-12-02 ENCOUNTER — Ambulatory Visit (HOSPITAL_COMMUNITY)
Admission: RE | Admit: 2017-12-02 | Discharge: 2017-12-02 | Disposition: A | Discharge status: Home / Self Care | Payer: 59 | Source: Ambulatory Visit | Attending: Cardiology | Admitting: Cardiology

## 2017-12-02 ENCOUNTER — Other Ambulatory Visit: Payer: Self-pay

## 2017-12-02 ENCOUNTER — Encounter (HOSPITAL_COMMUNITY): Payer: Self-pay

## 2017-12-02 VITALS — BP 138/90 | HR 68 | Wt 306.0 lb

## 2017-12-02 DIAGNOSIS — E785 Hyperlipidemia, unspecified: Secondary | ICD-10-CM | POA: Diagnosis not present

## 2017-12-02 DIAGNOSIS — Z791 Long term (current) use of non-steroidal anti-inflammatories (NSAID): Secondary | ICD-10-CM | POA: Insufficient documentation

## 2017-12-02 DIAGNOSIS — E039 Hypothyroidism, unspecified: Secondary | ICD-10-CM | POA: Insufficient documentation

## 2017-12-02 DIAGNOSIS — E669 Obesity, unspecified: Secondary | ICD-10-CM | POA: Insufficient documentation

## 2017-12-02 DIAGNOSIS — I5032 Chronic diastolic (congestive) heart failure: Secondary | ICD-10-CM | POA: Insufficient documentation

## 2017-12-02 DIAGNOSIS — I509 Heart failure, unspecified: Secondary | ICD-10-CM

## 2017-12-02 DIAGNOSIS — I2510 Atherosclerotic heart disease of native coronary artery without angina pectoris: Secondary | ICD-10-CM | POA: Diagnosis not present

## 2017-12-02 DIAGNOSIS — R5383 Other fatigue: Secondary | ICD-10-CM | POA: Diagnosis present

## 2017-12-02 DIAGNOSIS — K219 Gastro-esophageal reflux disease without esophagitis: Secondary | ICD-10-CM | POA: Insufficient documentation

## 2017-12-02 DIAGNOSIS — I110 Hypertensive heart disease with heart failure: Secondary | ICD-10-CM | POA: Diagnosis not present

## 2017-12-02 DIAGNOSIS — I252 Old myocardial infarction: Secondary | ICD-10-CM | POA: Insufficient documentation

## 2017-12-02 DIAGNOSIS — Z79890 Hormone replacement therapy (postmenopausal): Secondary | ICD-10-CM | POA: Insufficient documentation

## 2017-12-02 DIAGNOSIS — Z87891 Personal history of nicotine dependence: Secondary | ICD-10-CM | POA: Diagnosis not present

## 2017-12-02 DIAGNOSIS — J449 Chronic obstructive pulmonary disease, unspecified: Secondary | ICD-10-CM | POA: Insufficient documentation

## 2017-12-02 DIAGNOSIS — Z7982 Long term (current) use of aspirin: Secondary | ICD-10-CM | POA: Insufficient documentation

## 2017-12-02 DIAGNOSIS — Z6841 Body Mass Index (BMI) 40.0 and over, adult: Secondary | ICD-10-CM | POA: Insufficient documentation

## 2017-12-02 DIAGNOSIS — Z8249 Family history of ischemic heart disease and other diseases of the circulatory system: Secondary | ICD-10-CM | POA: Insufficient documentation

## 2017-12-02 DIAGNOSIS — Z79899 Other long term (current) drug therapy: Secondary | ICD-10-CM | POA: Diagnosis not present

## 2017-12-02 DIAGNOSIS — R0683 Snoring: Secondary | ICD-10-CM

## 2017-12-02 DIAGNOSIS — I10 Essential (primary) hypertension: Secondary | ICD-10-CM | POA: Diagnosis not present

## 2017-12-02 LAB — BASIC METABOLIC PANEL
ANION GAP: 8 (ref 5–15)
BUN: 12 mg/dL (ref 6–20)
CHLORIDE: 106 mmol/L (ref 98–111)
CO2: 29 mmol/L (ref 22–32)
Calcium: 7.4 mg/dL — ABNORMAL LOW (ref 8.9–10.3)
Creatinine, Ser: 1.1 mg/dL — ABNORMAL HIGH (ref 0.44–1.00)
GFR calc Af Amer: >60 mL/min (ref >60)
GFR, EST NON AFRICAN AMERICAN: 54 mL/min — AB (ref >60)
GLUCOSE: 98 mg/dL (ref 70–99)
POTASSIUM: 4.6 mmol/L (ref 3.5–5.1)
Sodium: 143 mmol/L (ref 135–145)

## 2017-12-02 LAB — BRAIN NATRIURETIC PEPTIDE: B Natriuretic Peptide: 105.1 pg/mL — ABNORMAL HIGH (ref 0.0–100.0)

## 2017-12-02 MED ORDER — SPIRONOLACTONE 25 MG PO TABS: 25.0000 mg | ORAL_TABLET | Freq: Every day | ORAL | 6 refills | Status: DC

## 2017-12-02 MED ORDER — FUROSEMIDE 20 MG PO TABS: 20.0000 mg | ORAL_TABLET | ORAL | 6 refills | Status: DC

## 2017-12-02 MED FILL — SPIRONOLACTONE 25 MG TABS: 25 | 30 days supply | Qty: 30 | Fill #0

## 2017-12-02 MED FILL — FUROSEMIDE 20 MG TABS: 20 | 40 days supply | Qty: 20 | Fill #0

## 2017-12-02 NOTE — Telephone Encounter (Signed)
FYI for Dr. Fry---no papers have been received as of today.  Will place these in your folder once they are received.  thanks

## 2017-12-02 NOTE — Patient Instructions (Signed)
CONTINUE Spironolactone 25 mg, one tab daily CHANGE Lasix to 20 mg, one tab every other day  Labs today We will only contact you if something comes back abnormal or we need to make some changes. Otherwise no news is good news!  You have been referred to [facility]  With Dr. Radford Pax for further evaluation of sleep management [place] [address] [place] has recommended that you have a sleep study. This test records several body functions during sleep, including: brain activity, eye movement, oxygen and carbon dioxide blood levels, heart rate and rhythm, breathing rate and rhythm, the flow of air through your mouth and nose, snoring, body muscle movements, and chest and belly movement.  Your physician recommends that you schedule a follow-up appointment in: 8-12 weeks with Dr Haroldine Laws
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
## 2017-12-02 NOTE — Addendum Note (Signed)
Encounter addended by: Kerry Dory, CMA on: 12/02/2017 12:17 PM  Actions taken: Charge Capture section accepted

## 2017-12-04 DIAGNOSIS — M47816 Spondylosis without myelopathy or radiculopathy, lumbar region: Secondary | ICD-10-CM | POA: Diagnosis not present

## 2017-12-05 NOTE — Telephone Encounter (Signed)
Noted  

## 2017-12-08 MED FILL — TEMAZEPAM 15 MG CAPSULE: 15 | 90 days supply | Qty: 180 | Fill #1

## 2017-12-09 ENCOUNTER — Encounter: Payer: Self-pay | Admitting: Family Medicine

## 2017-12-09 MED FILL — METOPROLOL TARTRATE 25 MG T: 25 | 90 days supply | Qty: 90 | Fill #2

## 2017-12-10 ENCOUNTER — Encounter: Payer: Self-pay | Admitting: Physical Therapy

## 2017-12-10 ENCOUNTER — Ambulatory Visit: Payer: 59 | Admitting: Physical Therapy

## 2017-12-10 DIAGNOSIS — M6281 Muscle weakness (generalized): Secondary | ICD-10-CM

## 2017-12-10 DIAGNOSIS — M5441 Lumbago with sciatica, right side: Secondary | ICD-10-CM | POA: Diagnosis not present

## 2017-12-10 DIAGNOSIS — M62830 Muscle spasm of back: Secondary | ICD-10-CM

## 2017-12-10 NOTE — Patient Instructions (Signed)
Access Code: [email or web address]  URL: [email or web address]  Date: 12/10/2017  Prepared by: Earlie Counts   Exercises  Seated Piriformis Stretch with Trunk Bend - 2 reps - 30 hold - 1x daily - 7x weekly  Seated Hamstring Stretch - 2 reps - 1 sets - 30 hold - 1x daily - 7x weekly  Supine Lower Trunk Rotation - 10 reps - 2 sets - 1x daily - 7x weekly  Supine Transversus Abdominis Bracing - Hands on Stomach - 10 reps - 5 hold - 2x daily - 7x weekly  Supine Posterior Pelvic Tilt - 10 reps - 2 sets - 5 hold - 1x daily - 7x weekly  Supine Bridge with Mini Swiss Ball Between Knees - 10 reps - 2 sets - 1x daily - 7x weekly  Hooklying Isometric Clamshell - 10 reps - 2 sets - 1x daily - 7x weekly  Static Prone on Elbows - 1 reps - 1 sets - 15 sec hold - 1x daily - 7x weekly  Seated Shoulder Horizontal Abduction with Resistance - 10 reps - 2 sets - 1x daily - 7x weekly  Seated Shoulder Diagonal with Resistance - 10 reps - 2 sets - 1x daily - 7x weekly  Dead Bug - 10 reps - 3 sets - 1x daily - 7x weekly  [facility] [address], [place], [place] [address] Phone # [phone] Fax [phone]

## 2017-12-10 NOTE — Telephone Encounter (Signed)
Dr. Sarajane Jews these documents are on the top in the red folder.  Thanks

## 2017-12-10 NOTE — Therapy (Signed)
[facility] [address], [place], [place], [address] Phone: [phone]   Fax:  [phone]  Physical Therapy Treatment  Patient Details  Name: Nancy [name] MRN: [id number] Date of Birth: [birth year] Referring Provider (PT): Dr. Alysia Penna   Encounter Date: 12/10/2017  PT End of Session - 12/10/17 0928    Visit Number  15    Date for PT Re-Evaluation  12/10/17    Authorization Type  [facility]    PT Start Time  0845    PT Stop Time  0925    PT Time Calculation (min)  40 min    Activity Tolerance  Patient tolerated treatment well;No increased pain    Behavior During Therapy  WFL for tasks assessed/performed       Past Medical History:  Diagnosis Date  . Allergic rhinitis    gets shots per Dr. Harold Hedge   . Anemia 2001  . Anginal pain ([place])   . COPD (chronic obstructive pulmonary disease) ([place])    "CXR just showed mild COPD" (10/24/2011)  . Coronary artery disease    had MI in 2001, see[facility] Dr. Fletcher [facility] at [facility]  . GERD (gastroesophageal reflux disease)   . Gynecological examination    sees Dr. Cherylann Banas   . History of cardiac catheterization 07-24-07   showed nonconclusive disease  . Hyperlipidemia   . Hypertension   . Hypothyroidism   . Migraines    "have a history of migraines; haven't had one for years" (10/24/2011)  . Myocardial infarction [facility]) 2001?    Past Surgical History:  Procedure Laterality Date  . CARDIAC CATHETERIZATION  2009  . CESAREAN SECTION  1984  . COLONOSCOPY  10-06-08   per Dr. Henrene Pastor, benign polyps, repeat in 10 yrs   . ESOPHAGOGASTRODUODENOSCOPY  05/29/2017   per Dr. Henrene Pastor, normal except slight gastritis   . HERNIA REPAIR  ~ 2007   ventral hernia repair  . THYROIDECTOMY  1990's  . TONSILLECTOMY     "when I was a kid"    There were no vitals filed for this visit.  Subjective Assessment - 12/10/17 0840    Subjective  I had diastolic heart failure. I had some medications  changed and feeling better. I saw the neuro surgeon and felt therapy plateaued. I am scheduled to have an epidural injection 12/19/2017.  I go back to work full time this weekend.     How long can you sit comfortably?  20 min    Patient Stated Goals  reduce pain    Currently in Pain?  Yes    Pain Score  5     Pain Location  Back    Pain Orientation  Lower    Pain Descriptors / Indicators  Dull;Aching    Pain Type  Acute pain    Pain Radiating Towards  sometimes down the  posterior lateral aspect of her right thigh    Pain Onset  More than a month ago    Pain Frequency  Intermittent    Aggravating Factors   walking, sitting for a long period of time, when get up feels a catch    Pain Relieving Factors  nothing    Multiple Pain Sites  No         [facility] PT Assessment - 12/10/17 0001      Assessment   Medical Diagnosis  M54.5 Low back pain radiating to right leg    Referring Provider (PT)  Dr. Alysia Penna  Onset Date/Surgical Date  07/23/17    Prior Therapy  None      Precautions   Precautions  None      Restrictions   Weight Bearing Restrictions  No      Home Environment   Living Environment  Private residence      Prior Function   Level of Independence  Independent    Vocation  Full time employment      Cognition   Overall Cognitive Status  Within Functional Limits for tasks assessed      Observation/Other Assessments   Focus on Therapeutic Outcomes (FOTO)   46% limitation      Posture/Postural Control   Posture/Postural Control  Postural limitations      AROM   Lumbar Extension  full   pull in right leg   Lumbar - Right Side Bend  decreased by 25%   pull in right leg     Strength   Right Hip Flexion  5/5    Right Hip ABduction  5/5      Straight Leg Raise   Findings  Negative    Side   Right    Comment  65 degrees feel in her right thigh                   [facility] Adult PT Treatment/Exercise - 12/10/17 0001      Exercises   Exercises   Lumbar      Lumbar Exercises: Aerobic   Nustep  10 min no arms while assessing patient      Lumbar Exercises: Standing   Other Standing Lumbar Exercises  lean on counter and sink buttocks forward for pain relief      Lumbar Exercises: Seated   Other Seated Lumbar Exercises  shoulder horizontal abduction and diagonals with red band 20x each      Lumbar Exercises: Supine   Clam Limitations  green band on clamshells 2x15    Dead Bug  10 reps;1 second    Bridge  10 reps;2 seconds   small ROM/ glute set with ball squeeze            PT Education - 12/10/17 0927    Education Details  Access Code: [email or web address]     Person(s) Educated  Patient    Methods  Explanation;Demonstration;Verbal cues;Handout    Comprehension  Returned demonstration;Verbalized understanding       PT Short Term Goals - 11/05/17 0934      PT SHORT TERM GOAL #1   Title  independent with initial HEP    Time  4    Period  Weeks    Status  Achieved      PT SHORT TERM GOAL #2   Title  pain with sitting decreased >/= 25%    Time  4    Period  Weeks    Status  Achieved      PT SHORT TERM GOAL #3   Title  standing with pain  decreased >/= 25%    Time  4    Period  Weeks    Status  Achieved        PT Long Term Goals - 12/10/17 0849      PT LONG TERM GOAL #1   Title  independent with HEP    Time  6    Period  Weeks    Status  Achieved      PT LONG TERM GOAL #2   Title  lay on right side  during the night without waking up due to pain    Baseline  wakes up  5-6 times    Time  6    Period  Weeks    Status  Not Met      PT LONG TERM GOAL #3   Title  sitting for 45 minutes at the computer while doing paperwork for work with minimal to no pain    Baseline  moderate back pain    Time  6    Period  Weeks    Status  Not Met      PT LONG TERM GOAL #4   Title  walk without back hurting due to reduction in muscle spasms for 30 minutes    Baseline  back pain is 5-8/10 and limping    Time  6     Period  Weeks    Status  Not Met      PT LONG TERM GOAL #5   Title  FOTO score </= 34% limitation    Baseline  46% limitation    Time  6    Period  Weeks    Status  Not Met            Plan - 12/10/17 0911    Clinical Impression Statement  Patient is able to lay on right side for 1 hour prior to waking up. Patient will wake up 5-6 times per night due to pain.  Patient is able to walk 30 minutes prior to her pain increasing to 5-8/10. Patietn able to sit for 20-30 minutes prior to her pain increasing. Patient reports increased shooting pain with walking. Patient will be having an  injection on 12/19/2017 for reduction of back pain.  Patient has not met all of her goals due to pain. Patient will continue with her HEP.     Rehab Potential  Excellent    Clinical Impairments Affecting Rehab Potential  none    PT Treatment/Interventions  Cryotherapy;Electrical Stimulation;Iontophoresis 15m/ml Dexamethasone;Moist Heat;Traction;Ultrasound;Therapeutic exercise;Therapeutic activities;Neuromuscular re-education;Patient/family education;Manual techniques;Dry needling    PT Next Visit Plan  Discharge to HEP today    PT Home Exercise Plan  Access Code: [email or web address]   Consulted and Agree with Plan of Care  Patient       Patient will benefit from skilled therapeutic intervention in order to improve the following deficits and impairments:  Pain, Increased muscle spasms, Decreased activity tolerance, Decreased endurance, Decreased range of motion, Decreased strength  Visit Diagnosis: Acute right-sided low back pain with right-sided sciatica  Muscle weakness (generalized)  Muscle spasm of back     Problem List Patient Active Problem List   Diagnosis Date Noted  . Low back pain radiating to right leg 08/14/2017  . Super obesity 05/16/2017  . Chest pain with moderate risk for cardiac etiology 02/19/2017  . Chest pain 10/24/2011  . LIPOMA 09/12/2008  . Coronary atherosclerosis 07/30/2007  .  ANEMIA-NOS 11/10/2006  . MENOPAUSAL SYNDROME 11/10/2006  . HEADACHE 11/10/2006  . Hypothyroidism 09/25/2006  . Hypercholesterolemia 09/25/2006  . Essential hypertension 09/25/2006  . ALLERGIC RHINITIS 09/25/2006  . GERD 09/25/2006    CEarlie Counts PT 12/10/17 9:38 AM   [facility] [address] [place] [address]Phone: [phone]  Fax:  [phone] Name: KMorningstar ToftMRN: [id number] of Birth: 104-04-1955 PHYSICAL THERAPY DISCHARGE SUMMARY  Visits from Start of Care: 15  Current functional level related to goals / functional outcomes: See above.  Remaining deficits: See above.    Education / Equipment: HEP Plan: Patient agrees to discharge.  Patient goals were partially met. Patient is being discharged due to being pleased with the current functional level.  Thank you for the referral. Earlie Counts, PT 12/10/17 9:39 AM  ?????

## 2017-12-12 NOTE — Telephone Encounter (Signed)
This has been done.

## 2017-12-16 MED FILL — LOSARTAN POTASSIUM 100 MG T: 100 | 30 days supply | Qty: 30 | Fill #3

## 2017-12-19 DIAGNOSIS — M5416 Radiculopathy, lumbar region: Secondary | ICD-10-CM | POA: Diagnosis not present

## 2017-12-26 MED FILL — SPIRONOLACTONE 25 MG TABS: 25 | 30 days supply | Qty: 30 | Fill #1

## 2017-12-30 MED FILL — OMEPRAZOLE 40 MG CPDR: 40 | 90 days supply | Qty: 180 | Fill #2

## 2018-01-06 ENCOUNTER — Other Ambulatory Visit: Payer: Self-pay | Admitting: Family Medicine

## 2018-01-06 DIAGNOSIS — Z1231 Encounter for screening mammogram for malignant neoplasm of breast: Secondary | ICD-10-CM

## 2018-01-07 MED FILL — LEVOTHYROXINE 200 MCG TAB: 200 | 90 days supply | Qty: 90 | Fill #3

## 2018-01-07 MED FILL — FUROSEMIDE 20 MG TABS: 20 | 40 days supply | Qty: 20 | Fill #1

## 2018-01-14 DIAGNOSIS — M47816 Spondylosis without myelopathy or radiculopathy, lumbar region: Secondary | ICD-10-CM | POA: Diagnosis not present

## 2018-01-14 DIAGNOSIS — Z6841 Body Mass Index (BMI) 40.0 and over, adult: Secondary | ICD-10-CM | POA: Diagnosis not present

## 2018-01-14 DIAGNOSIS — R030 Elevated blood-pressure reading, without diagnosis of hypertension: Secondary | ICD-10-CM | POA: Diagnosis not present

## 2018-01-14 MED FILL — ATORVASTATIN CALCIUM 80 MG: 80 | 90 days supply | Qty: 90 | Fill #2

## 2018-01-14 MED FILL — LOSARTAN POTASSIUM 100 MG T: 100 | 30 days supply | Qty: 30 | Fill #4

## 2018-01-22 ENCOUNTER — Ambulatory Visit
Admission: RE | Admit: 2018-01-22 | Discharge: 2018-01-22 | Disposition: A | Discharge status: Home / Self Care | Payer: 59 | Source: Ambulatory Visit | Attending: Family Medicine | Admitting: Family Medicine

## 2018-01-22 DIAGNOSIS — Z1231 Encounter for screening mammogram for malignant neoplasm of breast: Secondary | ICD-10-CM

## 2018-01-28 MED FILL — SPIRONOLACTONE 25 MG TABS: 25 | 30 days supply | Qty: 30 | Fill #2

## 2018-01-28 MED FILL — MELOXICAM 15 MG TABLET: 15 | 90 days supply | Qty: 90 | Fill #1

## 2018-02-02 ENCOUNTER — Encounter (HOSPITAL_COMMUNITY): Payer: 59 | Admitting: Internal Medicine

## 2018-02-18 MED FILL — LOSARTAN POTASSIUM 100 MG T: 100 | 30 days supply | Qty: 30 | Fill #5

## 2018-02-19 ENCOUNTER — Encounter: Payer: Self-pay | Admitting: Family Medicine

## 2018-02-19 ENCOUNTER — Ambulatory Visit (INDEPENDENT_AMBULATORY_CARE_PROVIDER_SITE_OTHER): Payer: Self-pay | Admitting: Family Medicine

## 2018-02-19 VITALS — BP 112/78 | HR 78 | Temp 98.7°F | Resp 14 | Wt 316.6 lb

## 2018-02-19 DIAGNOSIS — R059 Cough, unspecified: Secondary | ICD-10-CM

## 2018-02-19 DIAGNOSIS — R05 Cough: Secondary | ICD-10-CM

## 2018-02-19 DIAGNOSIS — J029 Acute pharyngitis, unspecified: Secondary | ICD-10-CM

## 2018-02-19 DIAGNOSIS — R6889 Other general symptoms and signs: Secondary | ICD-10-CM

## 2018-02-19 LAB — POCT INFLUENZA A/B
INFLUENZA A, POC: NEGATIVE
INFLUENZA B, POC: NEGATIVE

## 2018-02-19 MED ORDER — OSELTAMIVIR PHOSPHATE 75 MG PO CAPS: 75.0000 mg | ORAL_CAPSULE | Freq: Two times a day (BID) | ORAL | 0 refills | Status: AC

## 2018-02-19 MED ORDER — AZELASTINE HCL 0.1 % NA SOLN: 1.0000 | Freq: Two times a day (BID) | NASAL | 0 refills | Status: DC

## 2018-02-19 MED FILL — OSELTAMIVIR PHOSPHATE 75 MG: 75 | 5 days supply | Qty: 10 | Fill #0

## 2018-02-19 MED FILL — AZELASTINE HCL 137 MCG SPRY: 0.1 | 50 days supply | Qty: 30 | Fill #0

## 2018-02-19 NOTE — Progress Notes (Signed)
Nancy [name] is a 63 y.o. female who presents today with 2 days of cough and congestion symptoms abrupt onset, she has not attempted any treatments up to this point for her symptoms outside of antipyretic treatment. 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   Review of Systems  Constitutional: Positive for fever. Negative for chills and malaise/fatigue.  HENT: Positive for congestion and sore throat. Negative for ear discharge, ear pain and sinus pain.   Eyes: Negative.   Respiratory: Positive for cough and shortness of breath. Negative for sputum production.   Cardiovascular: Negative.  Negative for chest pain.  Gastrointestinal: Negative for abdominal pain, diarrhea, nausea and vomiting.  Genitourinary: Negative for dysuria, frequency, hematuria and urgency.  Musculoskeletal: Negative for myalgias.  Skin: Negative.   Neurological: Negative for headaches.  Endo/Heme/Allergies: Negative.   Psychiatric/Behavioral: Negative.     Nancy [name] has a current medication list which includes the following prescription(s): albuterol sulfate, aspirin, atorvastatin, black cohosh, calcium, cetirizine, furosemide, hydrocodone-acetaminophen, krill oil, levothyroxine, losartan, meloxicam, methocarbamol, metoprolol tartrate, multivitamin, omeprazole, spironolactone, temazepam, and tramadol, and the following Facility-Administered Medications: sodium chloride. Also is allergic to sulfa antibiotics and sulfamethoxazole.  Nancy [name]  has a past medical history of Allergic rhinitis, Anemia (2001), Anginal pain ([place]), COPD (chronic obstructive pulmonary disease) ([place]), Coronary artery disease, GERD (gastroesophageal reflux disease), Gynecological examination, History of cardiac catheterization (07-24-07), Hyperlipidemia, Hypertension, Hypothyroidism, Migraines, and Myocardial infarction ([place]) (2001?). Also  has a past surgical history that includes Cesarean section (1984); Thyroidectomy  (1990's); Tonsillectomy; Hernia repair (~ 2007); Cardiac catheterization (2009); Colonoscopy (10-06-08); and Esophagogastroduodenoscopy (05/29/2017).    O: Vitals:   02/19/18 1544  BP: 112/78  Pulse: 78  Resp: 14  Temp: 98.7 F (37.1 C)  SpO2: 95%     Physical Exam Vitals signs reviewed.  Constitutional:      Appearance: She is well-developed. She is not toxic-appearing.  HENT:     Head: Normocephalic.     Right Ear: Hearing, tympanic membrane, ear canal and external ear normal.     Left Ear: Hearing, tympanic membrane, ear canal and external ear normal.     Nose: Congestion and rhinorrhea present.     Right Sinus: No maxillary sinus tenderness or frontal sinus tenderness.     Left Sinus: No maxillary sinus tenderness or frontal sinus tenderness.     Mouth/Throat:     Pharynx: Uvula midline. Posterior oropharyngeal erythema present.     Tonsils: No tonsillar exudate or tonsillar abscesses.  Neck:     Musculoskeletal: Normal range of motion and neck supple.  Cardiovascular:     Rate and Rhythm: Normal rate and regular rhythm.     Pulses: Normal pulses.     Heart sounds: Normal heart sounds.  Pulmonary:     Effort: Pulmonary effort is normal.     Breath sounds: Normal breath sounds. No stridor, decreased air movement or transmitted upper airway sounds. No decreased breath sounds, wheezing, rhonchi or rales.     Comments: Dry frequent cough- not intractable Abdominal:     General: Bowel sounds are normal.     Palpations: Abdomen is soft.  Musculoskeletal: Normal range of motion.  Lymphadenopathy:     Head:     Right side of head: No submental or submandibular adenopathy.     Left side of head: No submental or submandibular adenopathy.     Cervical: No cervical adenopathy.  Neurological:     Mental Status: She is alert and oriented to  person, place, and time.    A: 1. Sore throat    P: 1.Astelin BID as directed with Afrin BID x 3 days 2. Over counter cough  medication of choice use as directed 3. Tamiflu as directed 4. Tylenol or Motrin scheduled x 48 hours for fever and malaise as discussed. 5. Follow up if cough unimproved 6. Work note x 48 hours   1. Sore throat - POCT Influenza A/B Results for orders placed or performed in visit on 02/19/18 (from the past 24 hour(s))  POCT Influenza A/B     Status: None   Collection Time: 02/19/18  4:04 PM  Result Value Ref Range   Influenza A, POC Negative Negative   Influenza B, POC Negative Negative     2. Flu-like symptoms Treated with tamiflu due to risk of occupational exposure and the abrupt nature of her symptoms along with she is living with her grandchildren both about/under age 68. Discussed potential complications of the flu and her symptoms even though mild on exam and my concerns related to not treating due to her multiple chronic health conditions and risk for complication. She did agree to the treatment and plan of care and 48 hours work note. Will continue to monitor and staff will call at the 48 hour mark to assess if there was any improvement.  - azelastine (ASTELIN) 0.1 % nasal spray; Place 1 spray into both nostrils 2 (two) times daily. Use in each nostril as directed - oseltamivir (TAMIFLU) 75 MG capsule; Take 1 capsule (75 mg total) by mouth 2 (two) times daily for 5 days.  3. Cough Over the counter cough medication of choice since patient has declined tessalon perles and does find relief with various medications- Discussion about her hypertension and to look or talk to pharmacist about good options- patient also has albuterol that she can use. Denies a COPD or asthma reports she has had CT and X-ray that did not show this condition. Advised patient to follow plan of care discussed today and to reach back out if symptoms not improved. Patient reports that she has a hx of bronchitis.   Discussed with patient exam findings, suspected diagnosis etiology and  reviewed recommended  treatment plan and follow up, including complications and indications for urgent medical follow up and evaluation. Medications including use and indications reviewed with patient. Patient provided relevant patient education on diagnosis and/or relevant related condition that were discussed and reviewed with patient at discharge. Patient verbalized understanding of information provided and agrees with plan of care (POC), all questions answered.

## 2018-02-19 NOTE — Patient Instructions (Addendum)
Cough, Adult  1.Astelin BID as directed with Afrin BID x 3 days 2. Over counter cough medication of choice use as directed 3. Tamiflu as directed 4. Tylenol or Motrin for fever and malaise as discussed. 5. Follow up if cough unimproved 6. Work note x 48 hours OTC: Cough/Afrin   Coughing is a reflex that clears your throat and your airways. Coughing helps to heal and protect your lungs. It is normal to cough occasionally, but a cough that happens with other symptoms or lasts a long time may be a sign of a condition that needs treatment. A cough may last only 2-3 weeks (acute), or it may last longer than 8 weeks (chronic). What are the causes? Coughing is commonly caused by:  Breathing in substances that irritate your lungs.  A viral or bacterial respiratory infection.  Allergies.  Asthma.  Postnasal drip.  Smoking.  Acid backing up from the stomach into the esophagus (gastroesophageal reflux).  Certain medicines.  Chronic lung problems, including COPD (or rarely, lung cancer).  Other medical conditions such as heart failure. Follow these instructions at home: Pay attention to any changes in your symptoms. Take these actions to help with your discomfort:  Take medicines only as told by your health care provider. ? If you were prescribed an antibiotic medicine, take it as told by your health care provider. Do not stop taking the antibiotic even if you start to feel better. ? Talk with your health care provider before you take a cough suppressant medicine.  Drink enough fluid to keep your urine clear or pale yellow.  If the air is dry, use a cold steam vaporizer or humidifier in your bedroom or your home to help loosen secretions.  Avoid anything that causes you to cough at work or at home.  If your cough is worse at night, try sleeping in a semi-upright position.  Avoid cigarette smoke. If you smoke, quit smoking. If you need help quitting, ask your health care  provider.  Avoid caffeine.  Avoid alcohol.  Rest as needed. Contact a health care provider if:  You have new symptoms.  You cough up pus.  Your cough does not get better after 2-3 weeks, or your cough gets worse.  You cannot control your cough with suppressant medicines and you are losing sleep.  You develop pain that is getting worse or pain that is not controlled with pain medicines.  You have a fever.  You have unexplained weight loss.  You have night sweats. Get help right away if:  You cough up blood.  You have difficulty breathing.  Your heartbeat is very fast. This information is not intended to replace advice given to you by your health care provider. Make sure you discuss any questions you have with your health care provider. Document Released: 07/13/2010 Document Revised: 06/22/2015 Document Reviewed: 03/23/2014 Elsevier Interactive Patient Education  2019 [employer]  Influenza, Adult Influenza is also called "the flu." It is an infection in the lungs, nose, and throat (respiratory tract). It is caused by a virus. The flu causes symptoms that are similar to symptoms of a cold. It also causes a high fever and body aches. The flu spreads easily from person to person (is contagious). Getting a flu shot (influenza vaccination) every year is the best way to prevent the flu. What are the causes? This condition is caused by the influenza virus. You can get the virus by:  Breathing in droplets that are in the air from the  cough or sneeze of a person who has the virus.  Touching something that has the virus on it (is contaminated) and then touching your mouth, nose, or eyes. What increases the risk? Certain things may make you more likely to get the flu. These include:  Not washing your hands often.  Having close contact with many people during cold and flu season.  Touching your mouth, eyes, or nose without first washing your hands.  Not getting a flu shot  every year. You may have a higher risk for the flu, along with serious problems such as a lung infection (pneumonia), if you:  Are older than 65.  Are pregnant.  Have a weakened disease-fighting system (immune system) because of a disease or taking certain medicines.  Have a long-term (chronic) illness, such as: ? Heart, kidney, or lung disease. ? Diabetes. ? Asthma.  Have a liver disorder.  Are very overweight (morbidly obese).  Have anemia. This is a condition that affects your red blood cells. What are the signs or symptoms? Symptoms usually begin suddenly and last 4-14 days. They may include:  Fever and chills.  Headaches, body aches, or muscle aches.  Sore throat.  Cough.  Runny or stuffy (congested) nose.  Chest discomfort.  Not wanting to eat as much as normal (poor appetite).  Weakness or feeling tired (fatigue).  Dizziness.  Feeling sick to your stomach (nauseous) or throwing up (vomiting). How is this treated? If the flu is found early, you can be treated with medicine that can help reduce how bad the illness is and how long it lasts (antiviral medicine). This may be given by mouth (orally) or through an IV tube. Taking care of yourself at home can help your symptoms get better. Your doctor may suggest:  Taking over-the-counter medicines.  Drinking plenty of fluids. The flu often goes away on its own. If you have very bad symptoms or other problems, you may be treated in a hospital. Follow these instructions at home:     Activity  Rest as needed. Get plenty of sleep.  Stay home from work or school as told by your doctor. ? Do not leave home until you do not have a fever for 24 hours without taking medicine. ? Leave home only to visit your doctor. Eating and drinking  Take an ORS (oral rehydration solution). This is a drink that is sold at pharmacies and stores.  Drink enough fluid to keep your pee (urine) pale yellow.  Drink clear fluids in  small amounts as you are able. Clear fluids include: ? Water. ? Ice chips. ? Fruit juice that has water added (diluted fruit juice). ? Low-calorie sports drinks.  Eat bland, easy-to-digest foods in small amounts as you are able. These foods include: ? Bananas. ? Applesauce. ? Rice. ? Lean meats. ? Toast. ? Crackers.  Do not eat or drink: ? Fluids that have a lot of sugar or caffeine. ? Alcohol. ? Spicy or fatty foods. General instructions  Take over-the-counter and prescription medicines only as told by your doctor.  Use a cool mist humidifier to add moisture to the air in your home. This can make it easier for you to breathe.  Cover your mouth and nose when you cough or sneeze.  Wash your hands with soap and water often, especially after you cough or sneeze. If you cannot use soap and water, use alcohol-based hand sanitizer.  Keep all follow-up visits as told by your doctor. This is important. How is  this prevented?   Get a flu shot every year. You may get the flu shot in late summer, fall, or winter. Ask your doctor when you should get your flu shot.  Avoid contact with people who are sick during fall and winter (cold and flu season). Contact a doctor if:  You get new symptoms.  You have: ? Chest pain. ? Watery poop (diarrhea). ? A fever.  Your cough gets worse.  You start to have more mucus.  You feel sick to your stomach.  You throw up. Get help right away if you:  Have shortness of breath.  Have trouble breathing.  Have skin or nails that turn a bluish color.  Have very bad pain or stiffness in your neck.  Get a sudden headache.  Get sudden pain in your face or ear.  Cannot eat or drink without throwing up. Summary  Influenza ("the flu") is an infection in the lungs, nose, and throat. It is caused by a virus.  Take over-the-counter and prescription medicines only as told by your doctor.  Getting a flu shot every year is the best way to  avoid getting the flu. This information is not intended to replace advice given to you by your health care provider. Make sure you discuss any questions you have with your health care provider. Document Released: 10/24/2007 Document Revised: 07/02/2017 Document Reviewed: 07/02/2017 Elsevier Interactive Patient Education  2019 [employer].

## 2018-02-24 ENCOUNTER — Ambulatory Visit (INDEPENDENT_AMBULATORY_CARE_PROVIDER_SITE_OTHER): Payer: Self-pay | Admitting: Family Medicine

## 2018-02-24 ENCOUNTER — Encounter: Payer: Self-pay | Admitting: Family Medicine

## 2018-02-24 VITALS — BP 115/75 | HR 78 | Temp 98.6°F | Resp 18 | Wt 306.6 lb

## 2018-02-24 DIAGNOSIS — J019 Acute sinusitis, unspecified: Secondary | ICD-10-CM

## 2018-02-24 DIAGNOSIS — R05 Cough: Secondary | ICD-10-CM

## 2018-02-24 DIAGNOSIS — R059 Cough, unspecified: Secondary | ICD-10-CM

## 2018-02-24 MED ORDER — GUAIFENESIN-CODEINE 100-10 MG/5ML PO SYRP: 5.0000 mL | ORAL_SOLUTION | Freq: Three times a day (TID) | ORAL | 0 refills | Status: DC | PRN

## 2018-02-24 MED ORDER — DOXYCYCLINE HYCLATE 100 MG PO TABS: 100.0000 mg | ORAL_TABLET | Freq: Two times a day (BID) | ORAL | 0 refills | Status: DC

## 2018-02-24 MED FILL — GUAIATUSSIN AC LIQUID: 100-10 | 8 days supply | Qty: 120 | Fill #0

## 2018-02-24 MED FILL — DOXYCYCLINE HYCLATE 100 MG: 100 | 10 days supply | Qty: 20 | Fill #0

## 2018-02-24 NOTE — Progress Notes (Signed)
Nancy [name] is a 63 y.o. female who presents today with 7 days of cough and congestion symptoms. She was seen by me in this clinic 5 days ago for sore throat and cough and congestion. 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 Nancy [name] called yesterday stating she was not any better and was advised to come back and follow up for in clinic evaluation due to [facility] extensive medical history. Up to this point she has attempted over the counter cough medication, nasal antihistamine, supportive care for sore throat and treated with Tamiflu for suspected influenza due to known exposure. Today she presents with progressive symptoms of increased cough frequency and congestion. She is present here with her daughter. Of note she has evaluation in the last 90 days for suspected CHF exacerbation caused by prednisone last Halloween and is under the care of Cardiology and due to follow up in the next 4 weeks. She reports that she has followed up and been seen since then.  Record review history illustrates ofnon-obstructiveCAD, hyperlipidemia, obesity, hypertension, mild COPD,former smoker,and GERD.  Review of Systems  Constitutional: Positive for fever and malaise/fatigue. Negative for chills.  HENT: Negative for congestion, ear discharge, ear pain, sinus pain and sore throat.   Eyes: Negative.   Respiratory: Positive for cough. Negative for sputum production and shortness of breath.   Cardiovascular: Negative.  Negative for chest pain.  Gastrointestinal: Negative for abdominal pain, diarrhea, nausea and vomiting.  Genitourinary: Negative for dysuria, frequency, hematuria and urgency.  Musculoskeletal: Negative for myalgias.  Skin: Negative.   Neurological: Negative for headaches.  Endo/Heme/Allergies: Negative.   Psychiatric/Behavioral: Negative.     Nancy [name] has a current medication list which includes the following prescription(s): albuterol sulfate,  aspirin, atorvastatin, azelastine, calcium, cetirizine, furosemide, krill oil, levothyroxine, losartan, meloxicam, metoprolol tartrate, multivitamin, omeprazole, oseltamivir, spironolactone, temazepam, black cohosh, and doxycycline, and the following Facility-Administered Medications: sodium chloride. Also is allergic to sulfa antibiotics and sulfamethoxazole.  Nancy [name]  has a past medical history of Allergic rhinitis, Anemia (2001), Anginal pain ([place]), COPD (chronic obstructive pulmonary disease) ([place]), Coronary artery disease, GERD (gastroesophageal reflux disease), Gynecological examination, History of cardiac catheterization (07-24-07), Hyperlipidemia, Hypertension, Hypothyroidism, Migraines, and Myocardial infarction ([place]) (2001?). Also  has a past surgical history that includes Cesarean section (1984); Thyroidectomy (1990's); Tonsillectomy; Hernia repair (~ 2007); Cardiac catheterization (2009); Colonoscopy (10-06-08); and Esophagogastroduodenoscopy (05/29/2017).    O: Vitals:   02/24/18 1321  BP: 115/75  Pulse: 78  Resp: 18  Temp: 98.6 F (37 C)  SpO2: 96%     Physical Exam Vitals signs reviewed.  Constitutional:      General: She is not in acute distress.    Appearance: She is well-developed. She is not ill-appearing, toxic-appearing or diaphoretic.  HENT:     Head: Normocephalic.     Right Ear: Hearing, tympanic membrane, ear canal and external ear normal.     Left Ear: Hearing, tympanic membrane, ear canal and external ear normal.     Nose: Congestion and rhinorrhea present.     Right Sinus: Frontal sinus tenderness present. No maxillary sinus tenderness.     Left Sinus: Frontal sinus tenderness present. No maxillary sinus tenderness.     Mouth/Throat:     Mouth: Mucous membranes are moist.     Pharynx: Uvula midline.     Tonsils: No tonsillar exudate or tonsillar abscesses.  Neck:     Musculoskeletal: Normal range of motion and neck supple.  Cardiovascular:  Rate and  Rhythm: Normal rate and regular rhythm.     Pulses: Normal pulses.     Heart sounds: Normal heart sounds.  Pulmonary:     Effort: Pulmonary effort is normal. No respiratory distress.     Breath sounds: No stridor. Examination of the right-upper field reveals rhonchi. Examination of the left-upper field reveals rhonchi. Examination of the right-lower field reveals rhonchi. Examination of the left-lower field reveals rhonchi. Rhonchi present. No decreased breath sounds, wheezing or rales.     Comments: Mild congestion throughout Chest:     Chest wall: No tenderness.  Abdominal:     General: Bowel sounds are normal.     Palpations: Abdomen is soft.  Musculoskeletal: Normal range of motion.  Lymphadenopathy:     Head:     Right side of head: No submental or submandibular adenopathy.     Left side of head: No submental or submandibular adenopathy.     Cervical: No cervical adenopathy.  Skin:    General: Skin is warm.  Neurological:     Mental Status: She is alert and oriented to person, place, and time.      A: 1. Acute non-recurrent sinusitis, unspecified location   2. Cough      P: 1. Acute non-recurrent sinusitis, unspecified location Will introduce antibiotic based on presentation and length of symptoms advise dif symptoms not improved in 48 hours to seek care at primary and follow up in urgent care/ ED for emergent symptoms. - doxycycline (VIBRA-TABS) 100 MG tablet; Take 1 tablet (100 mg total) by mouth 2 (two) times daily.  2. Cough Inhaler- continue use at least TID OTC Mucinex (plain) Increase hydration Follow up with PCP, cardiology and potentially pulmonology. Discussed patient emphysema CT results and explained to patient that she does have COPD- considered prednisone but last dose given 11/27/2017 sent patient into CHF exacerbation- no known prior hx of CHF- no swelling found on exam today in hand,feet or ankles. Patient does report using Lasix as directed.  -  doxycycline (VIBRA-TABS) 100 MG tablet; Take 1 tablet (100 mg total) by mouth 2 (two) times daily.   Discussed with patient exam findings, suspected diagnosis etiology and  reviewed recommended treatment plan and follow up, including complications and indications for urgent medical follow up and evaluation. Medications including use and indications reviewed with patient. Patient provided relevant patient education on diagnosis and/or relevant related condition that were discussed and reviewed with patient at discharge. Patient verbalized understanding of information provided and agrees with plan of care (POC), all questions answered.

## 2018-02-24 NOTE — Patient Instructions (Addendum)
Cough, Adult  PLAN< Inhaler as needed Astelin twice daily Cough medicine as needed- Delsym Cheratussin nighttime and prn every 8 hours if needed  Coughing is a reflex that clears your throat and your airways. Coughing helps to heal and protect your lungs. It is normal to cough occasionally, but a cough that happens with other symptoms or lasts a long time may be a sign of a condition that needs treatment. A cough may last only 2-3 weeks (acute), or it may last longer than 8 weeks (chronic). What are the causes? Coughing is commonly caused by:  Breathing in substances that irritate your lungs.  A viral or bacterial respiratory infection.  Allergies.  Asthma.  Postnasal drip.  Smoking.  Acid backing up from the stomach into the esophagus (gastroesophageal reflux).  Certain medicines.  Chronic lung problems, including COPD (or rarely, lung cancer).  Other medical conditions such as heart failure. Follow these instructions at home: Pay attention to any changes in your symptoms. Take these actions to help with your discomfort:  Take medicines only as told by your health care provider. ? If you were prescribed an antibiotic medicine, take it as told by your health care provider. Do not stop taking the antibiotic even if you start to feel better. ? Talk with your health care provider before you take a cough suppressant medicine.  Drink enough fluid to keep your urine clear or pale yellow.  If the air is dry, use a cold steam vaporizer or humidifier in your bedroom or your home to help loosen secretions.  Avoid anything that causes you to cough at work or at home.  If your cough is worse at night, try sleeping in a semi-upright position.  Avoid cigarette smoke. If you smoke, quit smoking. If you need help quitting, ask your health care provider.  Avoid caffeine.  Avoid alcohol.  Rest as needed. Contact a health care provider if:  You have new symptoms.  You cough up  pus.  Your cough does not get better after 2-3 weeks, or your cough gets worse.  You cannot control your cough with suppressant medicines and you are losing sleep.  You develop pain that is getting worse or pain that is not controlled with pain medicines.  You have a fever.  You have unexplained weight loss.  You have night sweats. Get help right away if:  You cough up blood.  You have difficulty breathing.  Your heartbeat is very fast. This information is not intended to replace advice given to you by your health care provider. Make sure you discuss any questions you have with your health care provider. Document Released: 07/13/2010 Document Revised: 06/22/2015 Document Reviewed: 03/23/2014 Elsevier Interactive Patient Education  2019 [employer].

## 2018-02-25 ENCOUNTER — Ambulatory Visit: Payer: 59 | Admitting: Family Medicine

## 2018-03-03 ENCOUNTER — Other Ambulatory Visit: Payer: Self-pay | Admitting: Family Medicine

## 2018-03-03 MED FILL — PROAIR RESPICLICK INHAL PWD: 108 (90 BAS | 17 days supply | Qty: 1 | Fill #1

## 2018-03-03 MED FILL — SPIRONOLACTONE 25 MG TABS: 25 | 30 days supply | Qty: 30 | Fill #3

## 2018-03-03 MED FILL — FUROSEMIDE 20 MG TABS: 20 | 40 days supply | Qty: 20 | Fill #2

## 2018-03-04 NOTE — Telephone Encounter (Signed)
Dr. Sarajane Jews please advise on refill. thanks

## 2018-03-05 NOTE — Telephone Encounter (Signed)
Call in #[phone] with one rf

## 2018-03-09 MED FILL — TEMAZEPAM 15 MG CAPSULE: 15 | 90 days supply | Qty: 180 | Fill #0

## 2018-03-09 NOTE — Telephone Encounter (Signed)
Medication has been called to the pharmacy and left on the VM for #180 capsules with 1 refill.

## 2018-03-17 ENCOUNTER — Encounter: Payer: Self-pay | Admitting: Family Medicine

## 2018-03-17 ENCOUNTER — Ambulatory Visit: Payer: 59 | Admitting: Family Medicine

## 2018-03-17 VITALS — BP 130/82 | HR 71 | Temp 98.7°F | Wt 320.2 lb

## 2018-03-17 DIAGNOSIS — J329 Chronic sinusitis, unspecified: Secondary | ICD-10-CM | POA: Diagnosis not present

## 2018-03-17 DIAGNOSIS — J45901 Unspecified asthma with (acute) exacerbation: Secondary | ICD-10-CM | POA: Diagnosis not present

## 2018-03-17 DIAGNOSIS — R600 Localized edema: Secondary | ICD-10-CM | POA: Diagnosis not present

## 2018-03-17 DIAGNOSIS — J4540 Moderate persistent asthma, uncomplicated: Secondary | ICD-10-CM | POA: Diagnosis not present

## 2018-03-17 DIAGNOSIS — J45909 Unspecified asthma, uncomplicated: Secondary | ICD-10-CM | POA: Insufficient documentation

## 2018-03-17 MED ORDER — FUROSEMIDE 20 MG PO TABS: 20.0000 mg | ORAL_TABLET | Freq: Every day | ORAL | 3 refills | Status: DC

## 2018-03-17 MED ORDER — FLUTICASONE PROPIONATE HFA 110 MCG/ACT IN AERO: 2.0000 | INHALATION_SPRAY | Freq: Two times a day (BID) | RESPIRATORY_TRACT | 12 refills | Status: DC

## 2018-03-17 MED ORDER — IPRATROPIUM-ALBUTEROL 0.5-2.5 (3) MG/3ML IN SOLN
3.0000 mL | Freq: Once | RESPIRATORY_TRACT | Status: AC
  Administered 2018-03-17: 3 mL via RESPIRATORY_TRACT

## 2018-03-17 MED ORDER — AMOXICILLIN-POT CLAVULANATE 875-125 MG PO TABS: 1.0000 | ORAL_TABLET | Freq: Two times a day (BID) | ORAL | 0 refills | Status: DC

## 2018-03-17 MED ORDER — ALBUTEROL SULFATE (2.5 MG/3ML) 0.083% IN NEBU: 2.5000 mg | INHALATION_SOLUTION | RESPIRATORY_TRACT | 11 refills | Status: DC | PRN

## 2018-03-17 MED FILL — FLOVENT HFA 110 MCG INHALER: 110 | 30 days supply | Qty: 12 | Fill #0

## 2018-03-17 MED FILL — AMOX-CLAV 875-125 MG TABLET: 875-125 | 14 days supply | Qty: 28 | Fill #0

## 2018-03-17 MED FILL — ALBUTEROL 0.083 MG/ML SOLN: (2.5 MG/3ML | 6 days supply | Qty: 90 | Fill #0

## 2018-03-17 NOTE — Addendum Note (Signed)
Addended by: Elie Confer on: 03/17/2018 11:21 AM   Modules accepted: Orders

## 2018-03-17 NOTE — Progress Notes (Signed)
   Subjective:    Patient ID: Nancy [name], female    DOB: [birth year], 63 y.o.   MRN: [id number]  HPI Here for several months of recurrent sinus congestion, chest congestion, SOB, and coughing. The cough is mostly dry but for the past 2 weeks she is producing yellow sputum. She has an albuterol inhaler at home but this does not help much. She has also had more swelling in the feet and legs despite taking Lasix 20 mg every other day. She was having fevers to 102 degrees a few weeks ago, but not now. On 02-19-18 she saw [facility] and was diagnosed with influenza (even though she tested negative for flu), and she was given Tamiflu. This did not help so she returned there on 02-25-28 and was given Doxycycline. This has not helped either.    Review of Systems  Constitutional: Negative.   HENT: Positive for congestion, postnasal drip and sinus pressure. Negative for sinus pain and sore throat.   Eyes: Negative.   Respiratory: Positive for cough, chest tightness, shortness of breath and wheezing.   Cardiovascular: Positive for leg swelling. Negative for chest pain and palpitations.       Objective:   Physical Exam Constitutional:      Comments: She is hoarse and coughs frequently   HENT:     Right Ear: Tympanic membrane and ear canal normal.     Left Ear: Tympanic membrane and ear canal normal.     Nose: Nose normal.     Mouth/Throat:     Pharynx: Oropharynx is clear.  Eyes:     Conjunctiva/sclera: Conjunctivae normal.  Cardiovascular:     Rate and Rhythm: Normal rate and regular rhythm.     Pulses: Normal pulses.     Heart sounds: Normal heart sounds.  Pulmonary:     Effort: Pulmonary effort is normal.     Breath sounds: Wheezing present. No rhonchi or rales.  Lymphadenopathy:     Cervical: No cervical adenopathy.  Neurological:     General: No focal deficit present.     Mental Status: She is alert and oriented to person, place, and time.           Assessment & Plan:  She  has a chronic sinusitis which is causing a lot of bronchospasm and is exacerbating her asthma. She does have some CHF causing leg edema as well but there is no pulmonary edema. She was treated with Duoneb via nebulizer today and she responded well. She already has a rescue albuterol inhaler at home, but I wrote for her to get her own nebulizer to use at home. Start on Flovent inhaler for daily maintenance. Treat the sinusitis with Augmentin for 14 days. For the leg edema we will increase the Lasix to 20 mg every day. Recheck prn. Alysia Penna, MD

## 2018-03-18 MED ORDER — LOSARTAN POTASSIUM 100 MG PO TABS: 100.0000 mg | ORAL_TABLET | Freq: Every day | ORAL | 3 refills | Status: DC

## 2018-03-18 MED ORDER — METOPROLOL TARTRATE 25 MG PO TABS: 12.5000 mg | ORAL_TABLET | Freq: Two times a day (BID) | ORAL | 3 refills | Status: DC

## 2018-03-18 MED FILL — LOSARTAN POTASSIUM 100 MG T: 100 | 30 days supply | Qty: 30 | Fill #0

## 2018-03-18 MED FILL — METOPROLOL TARTRATE 25 MG T: 25 | 90 days supply | Qty: 90 | Fill #0

## 2018-03-18 NOTE — Telephone Encounter (Signed)
These were sent in

## 2018-03-19 MED FILL — FUROSEMIDE 20 MG TABS: 20 | 90 days supply | Qty: 90 | Fill #0

## 2018-03-22 ENCOUNTER — Telehealth: Payer: 59 | Admitting: Family

## 2018-03-22 DIAGNOSIS — H109 Unspecified conjunctivitis: Secondary | ICD-10-CM | POA: Diagnosis not present

## 2018-03-22 MED ORDER — OFLOXACIN 0.3 % OP SOLN: 1.0000 [drp] | Freq: Four times a day (QID) | OPHTHALMIC | Status: DC

## 2018-03-22 MED ORDER — OFLOXACIN 0.3 % OP SOLN: 1.0000 [drp] | Freq: Four times a day (QID) | OPHTHALMIC | 0 refills | Status: DC

## 2018-03-22 NOTE — Progress Notes (Signed)
Greater than 5 minutes, yet less than 10 minutes of time have been spent researching, coordinating, and implementing care for this patient today.  Thank you for the details you included in the comment boxes. Those details are very helpful in determining the best course of treatment for you and help [place] to provide the best care.  We are sorry that you are not feeling well.  Here is how we plan to help!  Based on what you have shared with me it looks like you have conjunctivitis.  Conjunctivitis is a common inflammatory or infectious condition of the eye that is often referred to as "pink eye".  In most cases it is contagious (viral or bacterial). However, not all conjunctivitis requires antibiotics (ex. Allergic).  We have made appropriate suggestions for you based upon your presentation.  I have prescribed Oflaxacin 1-2 drops 4 times a day times 5 days   Pink eye can be highly contagious.  It is typically spread through direct contact with secretions, or contaminated objects or surfaces that one may have touched.  Strict handwashing is suggested with soap and water is urged.  If not available, use alcohol based had sanitizer.  Avoid unnecessary touching of the eye.  If you wear contact lenses, you will need to refrain from wearing them until you see no white discharge from the eye for at least 24 hours after being on medication.  You should see symptom improvement in 1-2 days after starting the medication regimen.  Call us if symptoms are not improved in 1-2 days.  Home Care:  Wash your hands often!  Do not wear your contacts until you complete your treatment plan.  Avoid sharing towels, bed linen, personal items with a person who has pink eye.  See attention for anyone in your home with similar symptoms.  Get Help Right Away If:  Your symptoms do not improve.  You develop blurred or loss of vision.  Your symptoms worsen (increased discharge, pain or redness)  Your e-visit answers were  reviewed by a board certified advanced clinical practitioner to complete your personal care plan.  Depending on the condition, your plan could have included both over the counter or prescription medications.  If there is a problem please reply  once you have received a response from your provider.  Your safety is important to [place].  If you have drug allergies check your prescription carefully.    You can use MyChart to ask questions about today's visit, request a non-urgent call back, or ask for a work or school excuse for 24 hours related to this e-Visit. If it has been greater than 24 hours you will need to follow up with your provider, or enter a new e-Visit to address those concerns.   You will get an e-mail in the next two days asking about your experience.  I hope that your e-visit has been valuable and will speed your recovery. Thank you for using e-visits.

## 2018-03-22 NOTE — Addendum Note (Signed)
Addended by: Benjamine Mola on: 03/22/2018 03:26 PM   Modules accepted: Orders

## 2018-04-01 ENCOUNTER — Ambulatory Visit (HOSPITAL_COMMUNITY)
Admission: RE | Admit: 2018-04-01 | Discharge: 2018-04-01 | Disposition: A | Discharge status: Home / Self Care | Payer: 59 | Source: Ambulatory Visit | Attending: Internal Medicine | Admitting: Internal Medicine

## 2018-04-01 VITALS — BP 138/59 | HR 73 | Wt 321.0 lb

## 2018-04-01 DIAGNOSIS — E785 Hyperlipidemia, unspecified: Secondary | ICD-10-CM | POA: Insufficient documentation

## 2018-04-01 DIAGNOSIS — Z791 Long term (current) use of non-steroidal anti-inflammatories (NSAID): Secondary | ICD-10-CM | POA: Diagnosis not present

## 2018-04-01 DIAGNOSIS — I5032 Chronic diastolic (congestive) heart failure: Secondary | ICD-10-CM | POA: Diagnosis not present

## 2018-04-01 DIAGNOSIS — K219 Gastro-esophageal reflux disease without esophagitis: Secondary | ICD-10-CM | POA: Diagnosis not present

## 2018-04-01 DIAGNOSIS — Z87891 Personal history of nicotine dependence: Secondary | ICD-10-CM | POA: Diagnosis not present

## 2018-04-01 DIAGNOSIS — Z79899 Other long term (current) drug therapy: Secondary | ICD-10-CM | POA: Diagnosis not present

## 2018-04-01 DIAGNOSIS — Z8249 Family history of ischemic heart disease and other diseases of the circulatory system: Secondary | ICD-10-CM | POA: Diagnosis not present

## 2018-04-01 DIAGNOSIS — Z6841 Body Mass Index (BMI) 40.0 and over, adult: Secondary | ICD-10-CM | POA: Diagnosis not present

## 2018-04-01 DIAGNOSIS — I2510 Atherosclerotic heart disease of native coronary artery without angina pectoris: Secondary | ICD-10-CM | POA: Insufficient documentation

## 2018-04-01 DIAGNOSIS — E039 Hypothyroidism, unspecified: Secondary | ICD-10-CM | POA: Diagnosis not present

## 2018-04-01 DIAGNOSIS — I110 Hypertensive heart disease with heart failure: Secondary | ICD-10-CM | POA: Insufficient documentation

## 2018-04-01 DIAGNOSIS — I10 Essential (primary) hypertension: Secondary | ICD-10-CM | POA: Diagnosis not present

## 2018-04-01 DIAGNOSIS — E669 Obesity, unspecified: Secondary | ICD-10-CM | POA: Diagnosis not present

## 2018-04-01 DIAGNOSIS — J449 Chronic obstructive pulmonary disease, unspecified: Secondary | ICD-10-CM | POA: Diagnosis not present

## 2018-04-01 DIAGNOSIS — Z7982 Long term (current) use of aspirin: Secondary | ICD-10-CM | POA: Diagnosis not present

## 2018-04-01 DIAGNOSIS — Z7901 Long term (current) use of anticoagulants: Secondary | ICD-10-CM | POA: Insufficient documentation

## 2018-04-01 DIAGNOSIS — I252 Old myocardial infarction: Secondary | ICD-10-CM | POA: Diagnosis not present

## 2018-04-01 LAB — BASIC METABOLIC PANEL
ANION GAP: 8 (ref 5–15)
BUN: 24 mg/dL — ABNORMAL HIGH (ref 6–20)
CALCIUM: 8.1 mg/dL — AB (ref 8.9–10.3)
CHLORIDE: 105 mmol/L (ref 98–111)
CO2: 27 mmol/L (ref 22–32)
CREATININE: 1.41 mg/dL — AB (ref 0.44–1.00)
GFR calc non Af Amer: 40 mL/min — ABNORMAL LOW (ref >60)
GFR, EST AFRICAN AMERICAN: 47 mL/min — AB (ref >60)
Glucose, Bld: 132 mg/dL — ABNORMAL HIGH (ref 70–99)
Potassium: 4.3 mmol/L (ref 3.5–5.1)
SODIUM: 140 mmol/L (ref 135–145)

## 2018-04-01 LAB — MAGNESIUM: MAGNESIUM: 1.8 mg/dL (ref 1.7–2.4)

## 2018-04-01 MED ORDER — SPIRONOLACTONE 25 MG PO TABS: 25.0000 mg | ORAL_TABLET | Freq: Every day | ORAL | 3 refills | Status: DC

## 2018-04-01 MED ORDER — LOSARTAN POTASSIUM 100 MG PO TABS: 150.0000 mg | ORAL_TABLET | Freq: Every day | ORAL | 6 refills | Status: DC

## 2018-04-01 MED ORDER — LOSARTAN POTASSIUM 100 MG PO TABS: 150.0000 mg | ORAL_TABLET | Freq: Every day | ORAL | 3 refills | Status: DC

## 2018-04-01 MED FILL — SPIRONOLACTONE 25 MG TABS: 25 | 90 days supply | Qty: 90 | Fill #0
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
## 2018-04-01 NOTE — Patient Instructions (Signed)
INCREASE Losartan to 153m (1.5 tabs) daily  Labs today We will only contact you if something comes back abnormal or we need to make some changes. Otherwise no news is good news!  Your physician recommends that you schedule a follow-up appointment in: 9 months, Office will call you to schedule this appointment. If you do not receive a call, please call [facility] schedule this appointment.

## 2018-04-05 MED FILL — OMEPRAZOLE 40 MG CPDR: 40 | 90 days supply | Qty: 180 | Fill #3

## 2018-04-07 ENCOUNTER — Other Ambulatory Visit: Payer: Self-pay | Admitting: Family Medicine

## 2018-04-07 MED FILL — LEVOTHYROXINE 200 MCG TAB: 200 | 90 days supply | Qty: 90 | Fill #0

## 2018-04-16 MED FILL — FLOVENT HFA 110 MCG INHALER: 110 | 30 days supply | Qty: 12 | Fill #1

## 2018-04-16 MED FILL — ATORVASTATIN 80 MG TABLET: 80 | 90 days supply | Qty: 90 | Fill #3

## 2018-04-16 MED FILL — LOSARTAN POTASSIUM 100 MG T: 100 | 30 days supply | Qty: 30 | Fill #1

## 2018-04-28 ENCOUNTER — Other Ambulatory Visit: Payer: Self-pay

## 2018-04-28 ENCOUNTER — Encounter: Payer: Self-pay | Admitting: Family Medicine

## 2018-04-28 ENCOUNTER — Ambulatory Visit (INDEPENDENT_AMBULATORY_CARE_PROVIDER_SITE_OTHER): Payer: 59 | Admitting: Family Medicine

## 2018-04-28 DIAGNOSIS — H1032 Unspecified acute conjunctivitis, left eye: Secondary | ICD-10-CM | POA: Diagnosis not present

## 2018-04-28 MED ORDER — TOBRAMYCIN-DEXAMETHASONE 0.3-0.1 % OP SUSP: 2.0000 [drp] | OPHTHALMIC | 0 refills | Status: DC

## 2018-04-28 NOTE — Progress Notes (Signed)
Subjective:    Patient ID: Nancy [name], female    DOB: 21-Aug-1955, 63 y.o.   MRN: [id number]  HPI Virtual Visit via Video Note  I connected with the patient on 04/28/18 at 11:00 AM EDT by a video enabled telemedicine application and verified that I am speaking with the correct person using two identifiers.  Location patient: home Location provider:work or home office Persons participating in the virtual visit: patient, provider  I discussed the limitations of evaluation and management by telemedicine and the availability of in person appointments. The patient expressed understanding and agreed to proceed.   HPI: She has had an apparent pinkeye in the left eye for 2 days. The eye is mildly painful and red, and she has yellow green mucus coming out of it. Her vision is preserved. No fever or headache or cough or body aches. She does not wear contacts. She started using Tobradex drops last night but she does not feel better today. Her employers asked her to stay home until this resolves.    ROS: See pertinent positives and negatives per HPI.  Past Medical History:  Diagnosis Date  . Allergic rhinitis    gets shots per Dr. Harold Hedge   . Anemia 2001  . Anginal pain ([place])   . COPD (chronic obstructive pulmonary disease) ([place])    "CXR just showed mild COPD" (10/24/2011)  . Coronary artery disease    had MI in 2001, see[facility] Dr. Fletcher [facility]on at [facility]  . GERD (gastroesophageal reflux disease)   . Gynecological examination    sees Dr. Cherylann Banas   . History of cardiac catheterization 07-24-07   showed nonconclusive disease  . Hyperlipidemia   . Hypertension   . Hypothyroidism   . Migraines    "have a history of migraines; haven't had one for years" (10/24/2011)  . Myocardial infarction [facility]) 2001?    Past Surgical History:  Procedure Laterality Date  . CARDIAC CATHETERIZATION  2009  . CESAREAN SECTION  1984  . COLONOSCOPY  10-06-08   per Dr. Henrene Pastor, benign polyps, repeat in 10  yrs   . ESOPHAGOGASTRODUODENOSCOPY  05/29/2017   per Dr. Henrene Pastor, normal except slight gastritis   . HERNIA REPAIR  ~ 2007   ventral hernia repair  . THYROIDECTOMY  1990's  . TONSILLECTOMY     "when I was a kid"    Family History  Problem Relation Age of Onset  . Breast cancer Mother   . Pulmonary fibrosis Mother   . Coronary artery disease Father   . Hypertension Father   . Cancer Other        breast,colon,prostate  . Alcohol abuse Other   . Hyperlipidemia Other   . Hypertension Other   . Stroke Other   . Heart disease Other   . COPD Other   . Diabetes Other   . Stomach cancer Paternal Grandmother   . Colon cancer Paternal Grandfather     SOCIAL HX:    Current Outpatient Medications:  .  albuterol (PROVENTIL) (2.5 MG/3ML) 0.083% nebulizer solution, Take 3 mLs (2.5 mg total) by nebulization every 4 (four) hours as needed for wheezing or shortness of breath., Disp: 75 mL, Rfl: 11 .  Albuterol Sulfate (PROAIR RESPICLICK) 893 (90 Base) MCG/ACT AEPB, Inhale 1-2 puffs into the lungs every 6 (six) hours., Disp: 1 each, Rfl: 1 .  amoxicillin-clavulanate (AUGMENTIN) 875-125 MG tablet, Take 1 tablet by mouth 2 (two) times daily., Disp: 28 tablet, Rfl: 0 .  aspirin 81 MG tablet,  Take 81 mg by mouth at bedtime. , Disp: , Rfl:  .  atorvastatin (LIPITOR) 80 MG tablet, Take 1 tablet (80 mg total) by mouth daily., Disp: 90 tablet, Rfl: 3 .  azelastine (ASTELIN) 0.1 % nasal spray, Place 1 spray into both nostrils 2 (two) times daily. Use in each nostril as directed, Disp: 30 mL, Rfl: 0 .  Black Cohosh 40 MG CAPS, Take 1 capsule by mouth daily.  , Disp: , Rfl:  .  CALCIUM PO, Take 1,800 mg by mouth at bedtime., Disp: , Rfl:  .  cetirizine (ZYRTEC) 10 MG tablet, Take 10 mg by mouth daily., Disp: , Rfl:  .  fluticasone (FLOVENT HFA) 110 MCG/ACT inhaler, Inhale 2 puffs into the lungs 2 (two) times daily., Disp: 1 Inhaler, Rfl: 12 .  furosemide (LASIX) 20 MG tablet, Take 1 tablet (20 mg total) by  mouth daily., Disp: 90 tablet, Rfl: 3 .  KRILL OIL PO, Take 1,000 mg by mouth at bedtime. , Disp: , Rfl:  .  levothyroxine (SYNTHROID, LEVOTHROID) 200 MCG tablet, TAKE 1 TABLET BY MOUTH ONCE DAILY, Disp: 90 tablet, Rfl: 3 .  losartan (COZAAR) 100 MG tablet, Take 1.5 tablets (150 mg total) by mouth daily., Disp: 135 tablet, Rfl: 3 .  meloxicam (MOBIC) 15 MG tablet, Take 1 tablet (15 mg total) by mouth daily., Disp: 90 tablet, Rfl: 3 .  metoprolol tartrate (LOPRESSOR) 25 MG tablet, Take 0.5 tablets (12.5 mg total) by mouth 2 (two) times daily., Disp: 180 tablet, Rfl: 3 .  Multiple Vitamin (MULTIVITAMIN) tablet, Take 1 tablet by mouth at bedtime. , Disp: , Rfl:  .  ofloxacin (OCUFLOX) 0.3 % ophthalmic solution, Place 1 drop into both eyes every 6 (six) hours. For 5 days, Disp: 5 mL, Rfl: 0 .  omeprazole (PRILOSEC) 40 MG capsule, Take 1 capsule (40 mg total) by mouth 2 (two) times daily., Disp: 180 capsule, Rfl: 3 .  spironolactone (ALDACTONE) 25 MG tablet, Take 1 tablet (25 mg total) by mouth daily., Disp: 90 tablet, Rfl: 3 .  temazepam (RESTORIL) 15 MG capsule, TAKE 2 CAPSULES BY MOUTH AT BEDTIME, Disp: 180 capsule, Rfl: 1  Current Facility-Administered Medications:  .  0.9 %  sodium chloride infusion, 500 mL, Intravenous, Once, Irene Shipper, MD .  ofloxacin (OCUFLOX) 0.3 % ophthalmic solution 1 drop, 1 drop, Both Eyes, Q6H, Withrow, John C, FNP  EXAM:  VITALS per patient if applicable:  GENERAL: alert, oriented, appears well and in no acute distress  HEENT: atraumatic, conjunttiva clear, no obvious abnormalities on inspection of external nose and ears  NECK: normal movements of the head and neck  LUNGS: on inspection no signs of respiratory distress, breathing rate appears normal, no obvious gross SOB, gasping or wheezing  CV: no obvious cyanosis  MS: moves all visible extremities without noticeable abnormality  PSYCH/NEURO: pleasant and cooperative, no obvious depression or anxiety,  speech and thought processing grossly intact  ASSESSMENT AND PLAN: Conjunctivitis, she will continue to use Tobradex drops every 4 hours. Apply cool compresses. I asked her to get back in touch with me if she has not improved in the next 2 days.  Alysia Penna, MD  Discussed the following assessment and plan:  No diagnosis found.     I discussed the assessment and treatment plan with the patient. The patient was provided an opportunity to ask questions and all were answered. The patient agreed with the plan and demonstrated an understanding of the instructions.   The  patient was advised to call back or seek an in-person evaluation if the symptoms worsen or if the condition fails to improve as anticipated.  I provided 15 minutes of non-face-to-face time during this encounter.    Review of Systems     Objective:   Physical Exam        Assessment & Plan:

## 2018-05-05 ENCOUNTER — Encounter: Payer: Self-pay | Admitting: Family Medicine

## 2018-05-05 MED FILL — MELOXICAM 15 MG TABLET: 15 | 90 days supply | Qty: 90 | Fill #2

## 2018-05-06 ENCOUNTER — Telehealth: Payer: Self-pay | Admitting: Family Medicine

## 2018-05-06 NOTE — Telephone Encounter (Signed)
Patient faxed FMLA papers for Dr. Sarajane Jews to complete  Call patient to pick up forms at: [phone]  Disposition: Dr's Folder

## 2018-05-07 DIAGNOSIS — I2510 Atherosclerotic heart disease of native coronary artery without angina pectoris: Secondary | ICD-10-CM

## 2018-05-12 MED FILL — LOSARTAN POTASSIUM 100 MG T: 100 | 30 days supply | Qty: 30 | Fill #2

## 2018-05-19 ENCOUNTER — Encounter: Payer: Self-pay | Admitting: Family Medicine

## 2018-05-20 ENCOUNTER — Encounter: Payer: Self-pay | Admitting: Family Medicine

## 2018-05-20 ENCOUNTER — Telehealth: Payer: Self-pay | Admitting: *Deleted

## 2018-05-20 DIAGNOSIS — Z0279 Encounter for issue of other medical certificate: Secondary | ICD-10-CM

## 2018-05-20 NOTE — Telephone Encounter (Signed)
Forms have been faxed.  Will have these scanned to the pts chart.

## 2018-05-20 NOTE — Telephone Encounter (Signed)
Copied from [place] [id number]. Topic: General - Other >> May 19, 2018  3:42 PM Yvette Rack wrote: Reason for CRM: Pt stated [employer] faxed the paperwork back due to Part C #3 dates should include dates 05/28/18 to 11/27/18. Pt stated she was informed that she has until Monday 05/25/18 to resubmit the paperwork   New forms completed and placed in the red folder to be reviewed by Dr. Sarajane Jews   Please advise on these once completed.  Thanks

## 2018-05-20 NOTE — Telephone Encounter (Signed)
The form is ready  

## 2018-05-21 NOTE — Telephone Encounter (Signed)
Pt states she just got off the phone with [place] and there is still a part that was not filled out.  Part c #3 needed to be completed.  Pt would like to speak with someone at the office.

## 2018-05-21 NOTE — Telephone Encounter (Signed)
Called and spoke with pt and she is aware that this form has been updated as requested.  This has been faxed back in today.

## 2018-05-27 ENCOUNTER — Encounter: Payer: Self-pay | Admitting: Family Medicine

## 2018-05-27 NOTE — Telephone Encounter (Signed)
She should already have a refill available

## 2018-05-27 NOTE — Telephone Encounter (Signed)
Dr. Sarajane Jews please advise on refill. Thanks

## 2018-06-02 DIAGNOSIS — H1045 Other chronic allergic conjunctivitis: Secondary | ICD-10-CM | POA: Diagnosis not present

## 2018-06-03 ENCOUNTER — Telehealth: Payer: Self-pay | Admitting: Cardiovascular Disease

## 2018-06-03 NOTE — Telephone Encounter (Signed)
MyChart msg sent.

## 2018-06-05 MED FILL — LOSARTAN POTASSIUM 100 MG T: 100 | 30 days supply | Qty: 30 | Fill #3

## 2018-06-12 ENCOUNTER — Encounter: Payer: Self-pay | Admitting: Family Medicine

## 2018-06-15 MED FILL — TEMAZEPAM 15 MG CAPSULE: 15 | 90 days supply | Qty: 180 | Fill #1

## 2018-06-18 DIAGNOSIS — H524 Presbyopia: Secondary | ICD-10-CM | POA: Diagnosis not present

## 2018-06-18 DIAGNOSIS — H52223 Regular astigmatism, bilateral: Secondary | ICD-10-CM | POA: Diagnosis not present

## 2018-06-18 DIAGNOSIS — H5213 Myopia, bilateral: Secondary | ICD-10-CM | POA: Diagnosis not present

## 2018-06-23 ENCOUNTER — Telehealth: Payer: Self-pay | Admitting: *Deleted

## 2018-06-23 NOTE — Telephone Encounter (Signed)
A message was left,re: give our office a call.

## 2018-06-25 NOTE — Telephone Encounter (Signed)
Right now with the virus pandemic I don't know of any PT that will come into peoples' homes. Ask her to go do a little research and see if she can find someone willing to do that. Then I can do a referral

## 2018-06-25 NOTE — Telephone Encounter (Signed)
Dr. Fry please advise. Thanks  

## 2018-07-06 MED FILL — LOSARTAN POTASSIUM 100 MG T: 100 | 30 days supply | Qty: 30 | Fill #4

## 2018-07-07 ENCOUNTER — Other Ambulatory Visit: Payer: Self-pay | Admitting: Family Medicine

## 2018-07-07 ENCOUNTER — Encounter: Payer: Self-pay | Admitting: Family Medicine

## 2018-07-07 MED FILL — SPIRONOLACTONE 25 MG TABS: 25 | 90 days supply | Qty: 90 | Fill #1

## 2018-07-07 MED FILL — LEVOTHYROXINE 200 MCG TAB: 200 | 90 days supply | Qty: 90 | Fill #1

## 2018-07-07 MED FILL — OMEPRAZOLE 40 MG CPDR: 40 | 90 days supply | Qty: 180 | Fill #0

## 2018-07-07 MED FILL — FLOVENT HFA 110 MCG INHALER: 110 | 30 days supply | Qty: 12 | Fill #2

## 2018-07-14 MED FILL — METOPROLOL TARTRATE 25 MG T: 25 | 90 days supply | Qty: 90 | Fill #1

## 2018-07-22 ENCOUNTER — Other Ambulatory Visit: Payer: Self-pay | Admitting: Family Medicine

## 2018-07-27 MED FILL — FUROSEMIDE 20 MG TABS: 20 | 90 days supply | Qty: 90 | Fill #1

## 2018-07-27 NOTE — Telephone Encounter (Signed)
Pt calling to check status. Pt is out of medication.   [phone]

## 2018-07-28 MED FILL — ATORVASTATIN 80 MG TABLET: 80 | 90 days supply | Qty: 90 | Fill #0

## 2018-08-04 MED FILL — MELOXICAM 15 MG TABLET: 15 | 90 days supply | Qty: 90 | Fill #3

## 2018-08-05 MED FILL — LOSARTAN POTASSIUM 100 MG T: 100 | 30 days supply | Qty: 30 | Fill #5

## 2018-08-27 DIAGNOSIS — H0288A Meibomian gland dysfunction right eye, upper and lower eyelids: Secondary | ICD-10-CM | POA: Diagnosis not present

## 2018-08-27 DIAGNOSIS — H0288B Meibomian gland dysfunction left eye, upper and lower eyelids: Secondary | ICD-10-CM | POA: Diagnosis not present

## 2018-08-27 DIAGNOSIS — H04123 Dry eye syndrome of bilateral lacrimal glands: Secondary | ICD-10-CM | POA: Diagnosis not present

## 2018-09-07 ENCOUNTER — Encounter: Payer: Self-pay | Admitting: Family Medicine

## 2018-09-07 MED ORDER — TEMAZEPAM 15 MG PO CAPS: 30.0000 mg | ORAL_CAPSULE | Freq: Every day | ORAL | 1 refills | Status: DC

## 2018-09-07 NOTE — Telephone Encounter (Signed)
Done

## 2018-09-07 NOTE — Telephone Encounter (Signed)
Last filled 03/09/2018 Last OV 04/28/2018  Ok to fill?

## 2018-09-09 MED FILL — LOSARTAN POTASSIUM 100 MG T: 100 | 30 days supply | Qty: 30 | Fill #6

## 2018-09-09 MED FILL — TEMAZEPAM 15 MG CAPSULE: 15 | 90 days supply | Qty: 180 | Fill #0

## 2018-09-29 MED FILL — OMEPRAZOLE 40 MG CPDR: 40 | 90 days supply | Qty: 180 | Fill #1

## 2018-10-05 ENCOUNTER — Encounter: Payer: Self-pay | Admitting: Internal Medicine

## 2018-10-07 MED FILL — LEVOTHYROXINE 200 MCG TAB: 200 | 90 days supply | Qty: 90 | Fill #2

## 2018-10-07 MED FILL — METOPROLOL TARTRATE 25 MG T: 25 | 90 days supply | Qty: 90 | Fill #2

## 2018-10-07 MED FILL — LOSARTAN POTASSIUM 100 MG T: 100 | 30 days supply | Qty: 30 | Fill #7

## 2018-10-07 MED FILL — SPIRONOLACTONE 25 MG TABS: 25 | 90 days supply | Qty: 90 | Fill #2

## 2018-10-20 MED FILL — ATORVASTATIN 80 MG TABLET: 80 | 90 days supply | Qty: 90 | Fill #1

## 2018-11-06 MED FILL — LOSARTAN POTASSIUM 100 MG T: 100 | 30 days supply | Qty: 30 | Fill #8

## 2018-11-11 ENCOUNTER — Encounter: Payer: Self-pay | Admitting: Family Medicine

## 2018-11-12 ENCOUNTER — Encounter: Payer: Self-pay | Admitting: Family Medicine

## 2018-11-12 ENCOUNTER — Other Ambulatory Visit: Payer: Self-pay | Admitting: Family Medicine

## 2018-11-12 DIAGNOSIS — N289 Disorder of kidney and ureter, unspecified: Secondary | ICD-10-CM

## 2018-11-12 MED FILL — FUROSEMIDE 20 MG TABS: 20 | 90 days supply | Qty: 90 | Fill #2

## 2018-11-12 MED FILL — MELOXICAM 15 MG TABLET: 15 | 90 days supply | Qty: 90 | Fill #0

## 2018-11-17 ENCOUNTER — Telehealth: Payer: Self-pay

## 2018-11-17 NOTE — Telephone Encounter (Signed)
Copied from [place] [id number]. Topic: General - Other >> Nov 17, 2018 10:30 AM Carolyn Stare wrote: Pt call to ask if FMLA paperwork was received that was faxed over last Thursday , pt req call back

## 2018-11-18 NOTE — Telephone Encounter (Signed)
Called pt again and lm stating we have not received the PPW still. I advised pt to see if she could call them again or see if she could get the PPW and bring it by the office or upload it to [place].

## 2018-11-20 ENCOUNTER — Ambulatory Visit: Payer: Self-pay

## 2018-11-20 ENCOUNTER — Telehealth (INDEPENDENT_AMBULATORY_CARE_PROVIDER_SITE_OTHER): Payer: 59 | Admitting: Adult Health

## 2018-11-20 ENCOUNTER — Other Ambulatory Visit: Payer: Self-pay

## 2018-11-20 DIAGNOSIS — R252 Cramp and spasm: Secondary | ICD-10-CM

## 2018-11-20 DIAGNOSIS — R1084 Generalized abdominal pain: Secondary | ICD-10-CM | POA: Insufficient documentation

## 2018-11-20 DIAGNOSIS — R202 Paresthesia of skin: Secondary | ICD-10-CM | POA: Diagnosis not present

## 2018-11-20 DIAGNOSIS — R200 Anesthesia of skin: Secondary | ICD-10-CM

## 2018-11-20 MED ORDER — CYCLOBENZAPRINE HCL 10 MG PO TABS: 10.0000 mg | ORAL_TABLET | Freq: Three times a day (TID) | ORAL | 0 refills | Status: DC | PRN

## 2018-11-20 MED ORDER — METHYLPREDNISOLONE 4 MG PO TBPK: ORAL_TABLET | ORAL | 0 refills | Status: DC

## 2018-11-20 MED FILL — METHYLPREDNISOLONE 4 MG TBP: 4 | 6 days supply | Qty: 21 | Fill #0

## 2018-11-20 MED FILL — CYCLOBENZAPRINE HCL 10 MG T: 10 | 10 days supply | Qty: 30 | Fill #0
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
## 2018-11-20 NOTE — Telephone Encounter (Signed)
Pt. Reports she has been getting "Charlie horse pain" in her face, neck, legs and stomach for "awhile, but it is getting worse." Also for 2 weeks has noticed decreased sensation to hands and feet. Warm transfer to Tammy in the practice for a visit.  Answer Assessment - Initial Assessment Questions 1. ONSET: "When did the muscle aches or body pains start?"      Started "awile back" and getting worse 2. LOCATION: "What part of your body is hurting?" (e.g., entire body, arms, legs)      Face, neck, legs, stomach 3. SEVERITY: "How bad is the pain?" (Scale 1-10; or mild, moderate, severe)   - MILD (1-3): doesn't interfere with normal activities    - MODERATE (4-7): interferes with normal activities or awakens from sleep    - SEVERE (8-10):  excruciating pain, unable to do any normal activities       10 4. CAUSE: "What do you think is causing the pains?"     Unsure 5. FEVER: "Have you been having fever?"     No 6. OTHER SYMPTOMS: "Do you have any other symptoms?" (e.g., chest pain, weakness, rash, cold or flu symptoms, weight loss)     Tingling and decreased sensation in hands and feet 7. PREGNANCY: "Is there any chance you are pregnant?" "When was your last menstrual period?"     No 8. TRAVEL: "Have you traveled out of the country in the last month?" (e.g., travel history, exposures)     No  Protocols used: MUSCLE ACHES AND BODY PAIN-A-AH

## 2018-11-23 ENCOUNTER — Other Ambulatory Visit: Payer: Self-pay

## 2018-11-23 ENCOUNTER — Other Ambulatory Visit (INDEPENDENT_AMBULATORY_CARE_PROVIDER_SITE_OTHER): Payer: 59

## 2018-11-23 DIAGNOSIS — R200 Anesthesia of skin: Secondary | ICD-10-CM

## 2018-11-23 DIAGNOSIS — R202 Paresthesia of skin: Secondary | ICD-10-CM | POA: Diagnosis not present

## 2018-11-23 DIAGNOSIS — R252 Cramp and spasm: Secondary | ICD-10-CM | POA: Diagnosis not present

## 2018-11-23 LAB — COMPREHENSIVE METABOLIC PANEL
ALT: 16 U/L (ref 0–35)
AST: 13 U/L (ref 0–37)
Albumin: 4.1 g/dL (ref 3.5–5.2)
Alkaline Phosphatase: 92 U/L (ref 39–117)
BUN: 33 mg/dL — ABNORMAL HIGH (ref 6–23)
CO2: 30 mEq/L (ref 19–32)
Calcium: 5.9 mg/dL — ABNORMAL LOW (ref 8.4–10.5)
Chloride: 102 mEq/L (ref 96–112)
Creatinine, Ser: 1.79 mg/dL — ABNORMAL HIGH (ref 0.40–1.20)
GFR: 28.8 mL/min — ABNORMAL LOW (ref >60.00)
Glucose, Bld: 117 mg/dL — ABNORMAL HIGH (ref 70–99)
Potassium: 4.7 mEq/L (ref 3.5–5.1)
Sodium: 143 mEq/L (ref 135–145)
Total Bilirubin: 0.5 mg/dL (ref 0.2–1.2)
Total Protein: 6.6 g/dL (ref 6.0–8.3)

## 2018-11-23 LAB — CBC WITH DIFFERENTIAL/PLATELET
Basophils Absolute: 0.1 10*3/uL (ref 0.0–0.1)
Basophils Relative: 1.1 % (ref 0.0–3.0)
Eosinophils Absolute: 0.4 10*3/uL (ref 0.0–0.7)
Eosinophils Relative: 4.5 % (ref 0.0–5.0)
HCT: 41.4 % (ref 36.0–46.0)
Hemoglobin: 14 g/dL (ref 12.0–15.0)
Lymphocytes Relative: 20.8 % (ref 12.0–46.0)
Lymphs Abs: 1.9 10*3/uL (ref 0.7–4.0)
MCHC: 33.8 g/dL (ref 30.0–36.0)
MCV: 87.2 fl (ref 78.0–100.0)
Monocytes Absolute: 0.6 10*3/uL (ref 0.1–1.0)
Monocytes Relative: 7 % (ref 3.0–12.0)
Neutro Abs: 6.1 10*3/uL (ref 1.4–7.7)
Neutrophils Relative %: 66.6 % (ref 43.0–77.0)
Platelets: 216 10*3/uL (ref 150.0–400.0)
RBC: 4.75 Mil/uL (ref 3.87–5.11)
RDW: 13.9 % (ref 11.5–15.5)
WBC: 9.2 10*3/uL (ref 4.0–10.5)

## 2018-11-23 LAB — MAGNESIUM: Magnesium: 1.2 mg/dL — ABNORMAL LOW (ref 1.5–2.5)

## 2018-11-23 LAB — SEDIMENTATION RATE: Sed Rate: 34 mm/hr — ABNORMAL HIGH (ref 0–30)

## 2018-11-23 LAB — HEMOGLOBIN A1C: Hgb A1c MFr Bld: 6.1 % (ref 4.6–6.5)

## 2018-11-23 LAB — TSH: TSH: 1.91 u[IU]/mL (ref 0.35–4.50)

## 2018-11-23 LAB — C-REACTIVE PROTEIN: CRP: <1 mg/dL (ref 0.5–20.0)

## 2018-11-24 ENCOUNTER — Telehealth: Payer: Self-pay | Admitting: Adult Health

## 2018-11-24 ENCOUNTER — Encounter: Payer: Self-pay | Admitting: Adult Health

## 2018-11-24 ENCOUNTER — Encounter: Payer: Self-pay | Admitting: Family Medicine

## 2018-11-24 LAB — ANA: Anti Nuclear Antibody (ANA): NEGATIVE

## 2018-11-24 NOTE — Telephone Encounter (Signed)
Spoke to patient and informed her of her labs. Will have her start Magnesium supplement. Cr & BUN increased and GFR has decreased. Spoke to her PCP who will refer her to Nephrology.   She will get a Covid test today

## 2018-11-24 NOTE — Telephone Encounter (Signed)
Patient requesting call back regarding calcium level- she states she saw it on MyChart.

## 2018-11-24 NOTE — Telephone Encounter (Signed)
I referred her to Nephrology

## 2018-11-25 ENCOUNTER — Encounter: Payer: Self-pay | Admitting: Adult Health

## 2018-11-25 ENCOUNTER — Other Ambulatory Visit: Payer: Self-pay | Admitting: Adult Health

## 2018-11-25 DIAGNOSIS — R202 Paresthesia of skin: Secondary | ICD-10-CM

## 2018-11-25 LAB — HEAVY METALS PROFILE II, BLOOD
Arsenic: 6 ug/L (ref 2–23)
Cadmium: 0.7 ug/L (ref 0.0–1.2)
Lead, Blood: <1 ug/dL (ref 0–4)
Mercury: <1 ug/L (ref 0.0–14.9)

## 2018-11-26 ENCOUNTER — Inpatient Hospital Stay (HOSPITAL_BASED_OUTPATIENT_CLINIC_OR_DEPARTMENT_OTHER)
Admission: EM | Admit: 2018-11-26 | Discharge: 2018-11-28 | DRG: 644 | Disposition: A | Discharge status: Home / Self Care | Payer: 59 | Attending: Family Medicine | Admitting: Family Medicine

## 2018-11-26 ENCOUNTER — Other Ambulatory Visit (INDEPENDENT_AMBULATORY_CARE_PROVIDER_SITE_OTHER): Payer: 59

## 2018-11-26 ENCOUNTER — Other Ambulatory Visit: Payer: Self-pay

## 2018-11-26 ENCOUNTER — Encounter (HOSPITAL_BASED_OUTPATIENT_CLINIC_OR_DEPARTMENT_OTHER): Payer: Self-pay

## 2018-11-26 ENCOUNTER — Telehealth: Payer: Self-pay

## 2018-11-26 ENCOUNTER — Emergency Department (HOSPITAL_BASED_OUTPATIENT_CLINIC_OR_DEPARTMENT_OTHER): Payer: 59

## 2018-11-26 DIAGNOSIS — Z803 Family history of malignant neoplasm of breast: Secondary | ICD-10-CM

## 2018-11-26 DIAGNOSIS — Z800 Family history of malignant neoplasm of digestive organs: Secondary | ICD-10-CM

## 2018-11-26 DIAGNOSIS — Z8249 Family history of ischemic heart disease and other diseases of the circulatory system: Secondary | ICD-10-CM

## 2018-11-26 DIAGNOSIS — Z825 Family history of asthma and other chronic lower respiratory diseases: Secondary | ICD-10-CM

## 2018-11-26 DIAGNOSIS — I5032 Chronic diastolic (congestive) heart failure: Secondary | ICD-10-CM | POA: Diagnosis not present

## 2018-11-26 DIAGNOSIS — G43909 Migraine, unspecified, not intractable, without status migrainosus: Secondary | ICD-10-CM | POA: Diagnosis present

## 2018-11-26 DIAGNOSIS — R0600 Dyspnea, unspecified: Secondary | ICD-10-CM

## 2018-11-26 DIAGNOSIS — Z79890 Hormone replacement therapy (postmenopausal): Secondary | ICD-10-CM

## 2018-11-26 DIAGNOSIS — Z7982 Long term (current) use of aspirin: Secondary | ICD-10-CM | POA: Diagnosis not present

## 2018-11-26 DIAGNOSIS — Z791 Long term (current) use of non-steroidal anti-inflammatories (NSAID): Secondary | ICD-10-CM

## 2018-11-26 DIAGNOSIS — Z87891 Personal history of nicotine dependence: Secondary | ICD-10-CM

## 2018-11-26 DIAGNOSIS — E039 Hypothyroidism, unspecified: Secondary | ICD-10-CM | POA: Diagnosis not present

## 2018-11-26 DIAGNOSIS — E209 Hypoparathyroidism, unspecified: Secondary | ICD-10-CM | POA: Diagnosis not present

## 2018-11-26 DIAGNOSIS — I10 Essential (primary) hypertension: Secondary | ICD-10-CM | POA: Diagnosis present

## 2018-11-26 DIAGNOSIS — Z882 Allergy status to sulfonamides status: Secondary | ICD-10-CM

## 2018-11-26 DIAGNOSIS — E7800 Pure hypercholesterolemia, unspecified: Secondary | ICD-10-CM | POA: Diagnosis present

## 2018-11-26 DIAGNOSIS — G4700 Insomnia, unspecified: Secondary | ICD-10-CM | POA: Diagnosis not present

## 2018-11-26 DIAGNOSIS — Z833 Family history of diabetes mellitus: Secondary | ICD-10-CM | POA: Diagnosis not present

## 2018-11-26 DIAGNOSIS — N179 Acute kidney failure, unspecified: Secondary | ICD-10-CM | POA: Diagnosis not present

## 2018-11-26 DIAGNOSIS — N189 Chronic kidney disease, unspecified: Secondary | ICD-10-CM | POA: Insufficient documentation

## 2018-11-26 DIAGNOSIS — Z823 Family history of stroke: Secondary | ICD-10-CM

## 2018-11-26 DIAGNOSIS — R202 Paresthesia of skin: Secondary | ICD-10-CM | POA: Diagnosis not present

## 2018-11-26 DIAGNOSIS — Z03818 Encounter for observation for suspected exposure to other biological agents ruled out: Secondary | ICD-10-CM | POA: Diagnosis not present

## 2018-11-26 DIAGNOSIS — K219 Gastro-esophageal reflux disease without esophagitis: Secondary | ICD-10-CM | POA: Diagnosis present

## 2018-11-26 DIAGNOSIS — J449 Chronic obstructive pulmonary disease, unspecified: Secondary | ICD-10-CM | POA: Diagnosis present

## 2018-11-26 DIAGNOSIS — N183 Chronic kidney disease, stage 3 unspecified: Secondary | ICD-10-CM | POA: Diagnosis present

## 2018-11-26 DIAGNOSIS — E890 Postprocedural hypothyroidism: Secondary | ICD-10-CM | POA: Diagnosis present

## 2018-11-26 DIAGNOSIS — E782 Mixed hyperlipidemia: Secondary | ICD-10-CM | POA: Diagnosis not present

## 2018-11-26 DIAGNOSIS — R7989 Other specified abnormal findings of blood chemistry: Secondary | ICD-10-CM | POA: Diagnosis not present

## 2018-11-26 DIAGNOSIS — Z79899 Other long term (current) drug therapy: Secondary | ICD-10-CM

## 2018-11-26 DIAGNOSIS — I252 Old myocardial infarction: Secondary | ICD-10-CM | POA: Diagnosis not present

## 2018-11-26 DIAGNOSIS — I2510 Atherosclerotic heart disease of native coronary artery without angina pectoris: Secondary | ICD-10-CM | POA: Diagnosis not present

## 2018-11-26 DIAGNOSIS — I130 Hypertensive heart and chronic kidney disease with heart failure and stage 1 through stage 4 chronic kidney disease, or unspecified chronic kidney disease: Secondary | ICD-10-CM | POA: Diagnosis not present

## 2018-11-26 DIAGNOSIS — Z20828 Contact with and (suspected) exposure to other viral communicable diseases: Secondary | ICD-10-CM | POA: Diagnosis present

## 2018-11-26 DIAGNOSIS — Z20822 Contact with and (suspected) exposure to covid-19: Secondary | ICD-10-CM

## 2018-11-26 LAB — CBC WITH DIFFERENTIAL/PLATELET
Abs Immature Granulocytes: 0.08 10*3/uL — ABNORMAL HIGH (ref 0.00–0.07)
Basophils Absolute: 0.1 10*3/uL (ref 0.0–0.1)
Basophils Relative: 1 %
Eosinophils Absolute: 0.3 10*3/uL (ref 0.0–0.5)
Eosinophils Relative: 4 %
HCT: 40.2 % (ref 36.0–46.0)
Hemoglobin: 13.1 g/dL (ref 12.0–15.0)
Immature Granulocytes: 1 %
Lymphocytes Relative: 19 %
Lymphs Abs: 1.5 10*3/uL (ref 0.7–4.0)
MCH: 29.2 pg (ref 26.0–34.0)
MCHC: 32.6 g/dL (ref 30.0–36.0)
MCV: 89.5 fL (ref 80.0–100.0)
Monocytes Absolute: 0.7 10*3/uL (ref 0.1–1.0)
Monocytes Relative: 9 %
Neutro Abs: 5.2 10*3/uL (ref 1.7–7.7)
Neutrophils Relative %: 66 %
Platelets: 165 10*3/uL (ref 150–400)
RBC: 4.49 MIL/uL (ref 3.87–5.11)
RDW: 13.5 % (ref 11.5–15.5)
WBC: 7.8 10*3/uL (ref 4.0–10.5)
nRBC: 0 % (ref 0.0–0.2)

## 2018-11-26 LAB — BASIC METABOLIC PANEL
Anion gap: 10 (ref 5–15)
BUN: 34 mg/dL — ABNORMAL HIGH (ref 6–20)
CO2: 25 mmol/L (ref 22–32)
Calcium: 6.3 mg/dL — CL (ref 8.9–10.3)
Chloride: 106 mmol/L (ref 98–111)
Creatinine, Ser: 1.71 mg/dL — ABNORMAL HIGH (ref 0.44–1.00)
GFR calc Af Amer: 37 mL/min — ABNORMAL LOW (ref >60)
GFR calc non Af Amer: 32 mL/min — ABNORMAL LOW (ref >60)
Glucose, Bld: 140 mg/dL — ABNORMAL HIGH (ref 70–99)
Potassium: 4.1 mmol/L (ref 3.5–5.1)
Sodium: 141 mmol/L (ref 135–145)

## 2018-11-26 LAB — MAGNESIUM: Magnesium: 1.4 mg/dL — ABNORMAL LOW (ref 1.7–2.4)

## 2018-11-26 LAB — ALBUMIN: Albumin: 3.8 g/dL (ref 3.5–5.0)

## 2018-11-26 LAB — TSH: TSH: 0.794 u[IU]/mL (ref 0.350–4.500)

## 2018-11-26 LAB — T4, FREE: Free T4: 1.04 ng/dL (ref 0.61–1.12)

## 2018-11-26 LAB — PHOSPHORUS: Phosphorus: 3.6 mg/dL (ref 2.5–4.6)

## 2018-11-26 MED ORDER — IBUPROFEN 800 MG PO TABS
800.0000 mg | ORAL_TABLET | Freq: Once | ORAL | Status: AC
  Administered 2018-11-26: 800 mg via ORAL
  Filled 2018-11-26: qty 1

## 2018-11-26 MED ORDER — ENOXAPARIN SODIUM 60 MG/0.6ML ~~LOC~~ SOLN
60.0000 mg | Freq: Every day | SUBCUTANEOUS | Status: DC
  Administered 2018-11-27 – 2018-11-28 (×2): 60 mg via SUBCUTANEOUS
  Filled 2018-11-26 (×3): qty 0.6

## 2018-11-26 MED ORDER — SODIUM CHLORIDE 0.9 % IV SOLN
1.0000 g | Freq: Once | INTRAVENOUS | Status: DC
  Filled 2018-11-26: qty 10

## 2018-11-26 MED ORDER — SODIUM CHLORIDE 0.9% FLUSH
3.0000 mL | Freq: Two times a day (BID) | INTRAVENOUS | Status: DC
  Administered 2018-11-27 (×3): 3 mL via INTRAVENOUS

## 2018-11-26 MED ORDER — ACETAMINOPHEN 650 MG RE SUPP: 650.0000 mg | Freq: Four times a day (QID) | RECTAL | Status: DC | PRN

## 2018-11-26 MED ORDER — CALCIUM GLUCONATE-NACL 1-0.675 GM/50ML-% IV SOLN
1.0000 g | Freq: Once | INTRAVENOUS | Status: AC
  Administered 2018-11-26: 1000 mg via INTRAVENOUS
  Filled 2018-11-26: qty 50

## 2018-11-26 MED ORDER — MAGNESIUM SULFATE 2 GM/50ML IV SOLN
2.0000 g | Freq: Once | INTRAVENOUS | Status: AC
  Administered 2018-11-26: 2 g via INTRAVENOUS
  Filled 2018-11-26: qty 50

## 2018-11-26 MED ORDER — MAGNESIUM SULFATE 50 % IJ SOLN
2.0000 g | Freq: Once | INTRAMUSCULAR | Status: DC
  Filled 2018-11-26: qty 4

## 2018-11-26 MED ORDER — SODIUM CHLORIDE 0.9 % IV BOLUS
1000.0000 mL | Freq: Once | INTRAVENOUS | Status: AC
  Administered 2018-11-26: 1000 mL via INTRAVENOUS

## 2018-11-26 MED ORDER — ACETAMINOPHEN 325 MG PO TABS
650.0000 mg | ORAL_TABLET | Freq: Four times a day (QID) | ORAL | Status: DC | PRN
  Administered 2018-11-27: 650 mg via ORAL
  Filled 2018-11-26: qty 2

## 2018-11-26 NOTE — Progress Notes (Signed)
RX Brief note: Lovenox  Wt= 143 kg, CrCl~52 ml/min, BMI = 49  Rx adjusted lovenox to 60 mg daily in pt with BMI>30  Thanks Dorrene German 11/26/2018 11:53 PM

## 2018-11-26 NOTE — Telephone Encounter (Signed)
Copied from [id number]. Topic: Appointment Scheduling - Scheduling Inquiry for Clinic >> Nov 26, 2018  7:41 AM Alanda Slim E wrote: Reason for CRM: Pt has orders for labs and would like to come in this morning to have them done/ please advise

## 2018-11-26 NOTE — ED Triage Notes (Addendum)
Pt states she had some lab work done from her PCP and was called today and told that her calcium was low. Pt was being evaluated by PCP for frequent cramping, and paraesthesias in bilateral hands and feet that she has experienced for "months."

## 2018-11-26 NOTE — H&P (Signed)
History and Physical    Nancy [name] CXK:[id number] DOB: 1955-09-16 DOA: 11/26/2018  PCP: Laurey Morale, MD  Patient coming from: Home  I have personally briefly reviewed patient's old medical records in [place]  Chief Complaint: Abnormal labs  HPI: Nancy [name] is a 63 y.o. female with medical history significant for chronic diastolic CHF (EF 97-02%, O3ZC by echocardiogram 02/20/2017), nonobstructive CAD, hypertension, hyperlipidemia, hypothyroidism, and CKD stage III who presents to the ED for management of hypocalcemia and hypomagnesemia.  Patient reports having 4-6 weeks of tingling sensation in all of her fingers and toes.  She initially felt this was related to carpal tunnel syndrome.  She had noticed some decrease sensation associated with this.  She then began to develop intermittent sensations of "charley horses" which could occur in her legs, her arms, or in her face.  These began to become more frequent and worsening over the last 2 weeks.  She has also noticed occasional feeling of a rapid heartbeat.  She has been feeling generally unwell but has not had any fevers, chills, or diaphoresis.  She does report having a sore throat.  She denies any abdominal pain, diarrhea, constipation, or dysuria.   She had a telemedicine visit with her primary care provider and was ordered to obtain lab work. Labs from 11/23/2018 were notable for Calcium 5.9 with albumin 4.1, increased creatinine to 1.79 from 1.41 previously in March, magnesium 1.2, TSH 1.91, negative antinuclear antibody, ESR 34, CRP <1.0, and negative heavy metals profile.  She was notified of her low calcium and magnesium levels and had subsequent labs collected for vitamin B1, vitamin B12, vitamin D, and PTH.  She was advised to present to the ED for further management of her electrolyte abnormalities.  ED Course:  Initial vitals showed BP 153/78, pulse 69, RR 20, temp 98.1 Fahrenheit, SPO2 90% on room air.  Labs are  notable for Calcium 6.3, albumin 3.8, magnesium 1.4, BUN 34, creatinine 1.71, phosphorus 3.6, TSH 0.794, free T4 1.04, WBC 7.8, hemoglobin 13.1, platelets 165,000.  PTH was collected and pending.  SARS-CoV-2 test was obtained and pending.  Renal ultrasound was obtained and was negative.  Patient was given 1 L normal saline, 2 g IV magnesium, and 1 g of IV calcium gluconate.  The hospitalist service was consulted to admit for further evaluation and management.  Review of Systems: All systems reviewed and are negative except as documented in history of present illness above.   Past Medical History:  Diagnosis Date  . Allergic rhinitis    gets shots per Dr. Harold Hedge   . Anemia 2001  . Anginal pain ([place])   . COPD (chronic obstructive pulmonary disease) ([place])    "CXR just showed mild COPD" (10/24/2011)  . Coronary artery disease    had MI in 2001, seees Dr. Fletcher Anon at [facility]  . GERD (gastroesophageal reflux disease)   . Gynecological examination    sees Dr. Cherylann Banas   . History of cardiac catheterization 07-24-07   showed nonconclusive disease  . Hyperlipidemia   . Hypertension   . Hypothyroidism   . Migraines    "have a history of migraines; haven't had one for years" (10/24/2011)  . Myocardial infarction [facility]) 2001?    Past Surgical History:  Procedure Laterality Date  . CARDIAC CATHETERIZATION  2009  . CESAREAN SECTION  1984  . COLONOSCOPY  10-06-08   per Dr. Henrene Pastor, benign polyps, repeat in 10 yrs   . ESOPHAGOGASTRODUODENOSCOPY  05/29/2017  per Dr. Henrene Pastor, normal except slight gastritis   . HERNIA REPAIR  ~ 2007   ventral hernia repair  . THYROIDECTOMY  1990's  . TONSILLECTOMY     "when I was a kid"    Social History:  reports that she quit smoking about 12 years ago. Her smoking use included cigarettes. She has a 30.00 pack-year smoking history. She has never used smokeless tobacco. She reports that she does not drink alcohol or use drugs.  Allergies   Allergen Reactions  . Sulfa Antibiotics Hives  . Sulfamethoxazole Hives    Family History  Problem Relation Age of Onset  . Breast cancer Mother   . Pulmonary fibrosis Mother   . Coronary artery disease Father   . Hypertension Father   . Cancer Other        breast,colon,prostate  . Alcohol abuse Other   . Hyperlipidemia Other   . Hypertension Other   . Stroke Other   . Heart disease Other   . COPD Other   . Diabetes Other   . Stomach cancer Paternal Grandmother   . Colon cancer Paternal Grandfather      Prior to Admission medications   Medication Sig Start Date End Date Taking? Authorizing Provider  albuterol (PROVENTIL) (2.5 MG/3ML) 0.083% nebulizer solution Take 3 mLs (2.5 mg total) by nebulization every 4 (four) hours as needed for wheezing or shortness of breath. 03/17/18   Laurey Morale, MD  Albuterol Sulfate (PROAIR RESPICLICK) 169 (90 Base) MCG/ACT AEPB Inhale 1-2 puffs into the lungs every 6 (six) hours. 11/25/17   Tereasa Coop, PA-C  amoxicillin-clavulanate (AUGMENTIN) 875-125 MG tablet Take 1 tablet by mouth 2 (two) times daily. 03/17/18   Laurey Morale, MD  aspirin 81 MG tablet Take 81 mg by mouth at bedtime.     [provider]  atorvastatin (LIPITOR) 80 MG tablet TAKE 1 TABLET BY MOUTH ONCE DAILY 07/28/18   Laurey Morale, MD  azelastine (ASTELIN) 0.1 % nasal spray Place 1 spray into both nostrils 2 (two) times daily. Use in each nostril as directed 02/19/18   Shella Maxim, NP  Black Cohosh 40 MG CAPS Take 1 capsule by mouth daily.      [provider]  CALCIUM PO Take 1,800 mg by mouth at bedtime.    [provider]  cetirizine (ZYRTEC) 10 MG tablet Take 10 mg by mouth daily.    [provider]  cyclobenzaprine (FLEXERIL) 10 MG tablet Take 1 tablet (10 mg total) by mouth 3 (three) times daily as needed for muscle spasms. 11/20/18   Nafziger, Tommi Rumps, NP  fluticasone (FLOVENT HFA) 110 MCG/ACT inhaler Inhale 2 puffs into the lungs  2 (two) times daily. 03/17/18   Laurey Morale, MD  furosemide (LASIX) 20 MG tablet Take 1 tablet (20 mg total) by mouth daily. 03/17/18   Laurey Morale, MD  KRILL OIL PO Take 1,000 mg by mouth at bedtime.     [provider]  levothyroxine (SYNTHROID, LEVOTHROID) 200 MCG tablet TAKE 1 TABLET BY MOUTH ONCE DAILY 04/07/18   Laurey Morale, MD  losartan (COZAAR) 100 MG tablet Take 1.5 tablets (150 mg total) by mouth daily. 04/01/18   Bensimhon, Shaune Pascal, MD  meloxicam (MOBIC) 15 MG tablet TAKE 1 TABLET BY MOUTH DAILY. 11/12/18   Laurey Morale, MD  methylPREDNISolone (MEDROL DOSEPAK) 4 MG TBPK tablet Take as directed 11/20/18   Dorothyann Peng, NP  metoprolol tartrate (LOPRESSOR) 25 MG tablet Take  0.5 tablets (12.5 mg total) by mouth 2 (two) times daily. 03/18/18   Laurey Morale, MD  Multiple Vitamin (MULTIVITAMIN) tablet Take 1 tablet by mouth at bedtime.     [provider]  ofloxacin (OCUFLOX) 0.3 % ophthalmic solution Place 1 drop into both eyes every 6 (six) hours. For 5 days 03/22/18   Benjamine Mola, FNP  omeprazole (PRILOSEC) 40 MG capsule TAKE 1 CAPSULE BY MOUTH 2 TIMES DAILY. 07/07/18   Laurey Morale, MD  spironolactone (ALDACTONE) 25 MG tablet Take 1 tablet (25 mg total) by mouth daily. 04/01/18   Bensimhon, Shaune Pascal, MD  temazepam (RESTORIL) 15 MG capsule Take 2 capsules (30 mg total) by mouth at bedtime. 09/07/18   Laurey Morale, MD  tobramycin-dexamethasone [facility]) ophthalmic solution Place 2 drops into the left eye every 4 (four) hours while awake. 04/28/18   Laurey Morale, MD    Physical Exam: Vitals:   11/26/18 2100 11/26/18 2145 11/26/18 2200 11/26/18 2321  BP: 133/73  (!) 105/92 137/64  Pulse: 65  62 66  Resp: 11  16 17   Temp:    98.3 F (36.8 C)  TempSrc:    Oral  SpO2: 99% 97%  96%  Weight:      Height:        Constitutional: Resting in bed with head elevated, NAD, calm, comfortable Eyes: EOMI, lids and conjunctivae normal ENMT: Mucous membranes are  moist. Posterior pharynx clear of any exudate or lesions. Neck: normal, supple, no masses. Respiratory: clear to auscultation bilaterally, no wheezing, no crackles. Normal respiratory effort. No accessory muscle use.  Cardiovascular: Regular rate and rhythm, soft systolic murmur.  Trace bilateral lower extremity edema.  Abdomen: no tenderness, no masses palpated. No hepatosplenomegaly. Bowel sounds positive.  Musculoskeletal: no clubbing / cyanosis. No joint deformity upper and lower extremities. Good ROM, no contractures. Normal muscle tone.  Skin: no rashes, lesions, ulcers. No induration Neurologic: CN 2-12 grossly intact. Sensation intact,Strength 5/5 in all 4.  Psychiatric: Normal judgment and insight. Alert and oriented x 3. Normal mood.     Labs on Admission: I have personally reviewed following labs and imaging studies  CBC: Recent Labs  Lab 11/23/18 0842 11/26/18 1515  WBC 9.2 7.8  NEUTROABS 6.1 5.2  HGB 14.0 13.1  HCT 41.4 40.2  MCV 87.2 89.5  PLT 216.0 419   Basic Metabolic Panel: Recent Labs  Lab 11/23/18 0842 11/26/18 1515  NA 143 141  K 4.7 4.1  CL 102 106  CO2 30 25  GLUCOSE 117* 140*  BUN 33* 34*  CREATININE 1.79* 1.71*  CALCIUM 5.9* 6.3*  MG 1.2* 1.4*  PHOS  --  3.6   GFR: Estimated Creatinine Clearance: 52.1 mL/min (A) (by C-G formula based on SCr of 1.71 mg/dL (H)). Liver Function Tests: Recent Labs  Lab 11/23/18 0842 11/26/18 1515  AST 13  --   ALT 16  --   ALKPHOS 92  --   BILITOT 0.5  --   PROT 6.6  --   ALBUMIN 4.1 3.8   No results for input(s): LIPASE, AMYLASE in the last 168 hours. No results for input(s): AMMONIA in the last 168 hours. Coagulation Profile: No results for input(s): INR, PROTIME in the last 168 hours. Cardiac Enzymes: No results for input(s): CKTOTAL, CKMB, CKMBINDEX, TROPONINI in the last 168 hours. BNP (last 3 results) Recent Labs    11/27/17 1039  PROBNP 331.0*   HbA1C: No results for input(s): HGBA1C in  the last 72 hours. CBG: No results for input(s): GLUCAP in the last 168 hours. Lipid Profile: No results for input(s): CHOL, HDL, LDLCALC, TRIG, CHOLHDL, LDLDIRECT in the last 72 hours. Thyroid Function Tests: Recent Labs    11/26/18 1720  TSH 0.794  FREET4 1.04   Anemia Panel: No results for input(s): VITAMINB12, FOLATE, FERRITIN, TIBC, IRON, RETICCTPCT in the last 72 hours. Urine analysis:    Component Value Date/Time   COLORURINE YELLOW 11/27/2017 [place] 11/27/2017 1039   LABSPEC 1.020 11/27/2017 1039   PHURINE 6.0 11/27/2017 1039   GLUCOSEU NEGATIVE 11/27/2017 1039   HGBUR NEGATIVE 11/27/2017 1039   HGBUR negative 09/15/2009 0858   BILIRUBINUR NEGATIVE 11/27/2017 1039   BILIRUBINUR N 06/03/2017 1146   KETONESUR NEGATIVE 11/27/2017 1039   PROTEINUR N 06/03/2017 1146   UROBILINOGEN 0.2 11/27/2017 1039   NITRITE NEGATIVE 11/27/2017 [place] 11/27/2017 1039    Radiological Exams on Admission: US Renal  Result Date: 11/26/2018 CLINICAL DATA:  Elevated creatinine EXAM: RENAL / URINARY TRACT ULTRASOUND COMPLETE COMPARISON:  Ultrasound 05/14/2017 FINDINGS: Right Kidney: Renal measurements: 10.9 x 5.6 x 5.6 cm = volume: 180.1 mL . Echogenicity within normal limits. No mass or hydronephrosis visualized. Left Kidney: Renal measurements: 10.7 x 5.2 x 4.4 cm = volume: 129 mL. Echogenicity within normal limits. No mass or hydronephrosis visualized. Bladder: Appears normal for degree of bladder distention. Other: None IMPRESSION: Negative renal ultrasound. Electronically Signed   By: Donavan Foil M.D.   On: 11/26/2018 19:28    EKG: Independently reviewed. Sinus rhythm, low voltage in aVL and V2 with Q waves in V2, QTC 457.  Not significantly changed when compared to prior.  Assessment/Plan Principal Problem:   Hypocalcemia Active Problems:   Hypothyroidism   Hypercholesterolemia   Essential hypertension   Chronic diastolic CHF (congestive  heart failure) (HCC)   Hypomagnesemia   Acute-on-chronic kidney injury (HCC)  Zyah Gomm is a 63 y.o. female with medical history significant for chronic diastolic CHF (EF 00-92%, Z3AQ by echocardiogram 02/20/2017), nonobstructive CAD, hypertension, hyperlipidemia, hypothyroidism, and CKD stage III who is admitted with hypocalcemia and hypomagnesemia.  Hypocalcemia/hypomagnesemia: Possibly secondary to CKD and/or medication use such as spironolactone.  She has received IV magnesium and IV calcium gluconate repletion in the ED. -Recheck labs in a.m. and replete as needed -Will hold home Lasix and spironolactone for now -Follow-up PTH, vitamin D levels  Acute on chronic stage III kidney injury: Has been having progressively worsening renal function over the last 9 months.  She received 1 L normal saline in the ED.  Renal ultrasound was negative. -Holding losartan, spironolactone, and Lasix for now -Recheck labs in a.m. and resume home meds as able  Chronic diastolic CHF: EF 76-22%, Q3FH by echocardiogram 02/20/2017.  Has trace lower extremity edema but otherwise appears overall compensated. -Holding home Lasix and spironolactone for now as above -Monitor I/O's  Nonobstructive CAD: Continue aspirin, atorvastatin, Lopressor.  Hypothyroidism: TSH 0.794, free T4 1.04.  Continue home Synthroid.  Hypertension: Currently stable.  Continue home Lopressor and holding Lasix, spironolactone, losartan as above.  Hyperlipidemia: Continue atorvastatin.  Insomnia: Continue home Restoril.  DVT prophylaxis: Lovenox Code Status: Full code, confirmed with patient Family Communication: Discussed with patient, she has discussed with her daughter Disposition Plan: Pending clinical Consults called: None Admission status: Observation   Zada Finders MD [facility]  If 7PM-7AM, please contact night-coverage [email or web address]  11/27/2018, 12:30 AM

## 2018-11-26 NOTE — Telephone Encounter (Signed)
I agree. This calcium level is dangerously low because it can lead to cardiac arrhythmias. I advise Nancy [name] to go to the ER immediately because she needs to receive IV calcium to get this up quickly. I am also arranging for her to see Nephrology for the kidney failure. Hopefully she can be admitted and all this can be done quickly.

## 2018-11-26 NOTE — ED Notes (Signed)
[place] is at the bedside

## 2018-11-26 NOTE — ED Provider Notes (Signed)
[place] EMERGENCY DEPARTMENT Provider Note   CSN: [id number] Arrival date & time: 11/26/18  1423     History   Chief Complaint Chief Complaint  Patient presents with  . Abnormal Lab    HPI Nancy [name] is a 63 y.o. female with history of nonobstructive CAD, hyperlipidemia, hypertension, remote tobacco use, asthma/COPD, GERD, heart failure, goiter s/p thyroidectomy on Synthroid here for evaluation of "debilitating" cramping like charley horses for the last few months, daily, worsening over the last 2 weeks.  Intermittent but daily located to hands, feet all over her body, face and scalp.  States when her face cramps up her lip will start to twitch and look abnormal.  She has associated numbness and tingling in her hands and feet as well.  She had telemedicine appointment with primary care NP last week who got lab work.  Her physician called her and told her her calcium was critically low and recommended ER evaluation for supplementation, admission.  She was also told her creatinine was abnormal.  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  She was diagnosed with left-sided heart failure in March and has since been taking Lasix, spironolactone and metoprolol.  She is managed by Dr. Haroldine Laws.  Reports history of MI in her 63s but no stents or CABG.  No history of diabetes.  No chest pain, palpitations, seizures or convulsions, confusion. HPI  Past Medical History:  Diagnosis Date  . Allergic rhinitis    gets shots per Dr. Harold Hedge   . Anemia 2001  . Anginal pain ([place])   . COPD (chronic obstructive pulmonary disease) ([place])    "CXR just showed mild COPD" (10/24/2011)  . Coronary artery disease    had MI in 2001, seees Dr. Fletcher [facility] at [facility]  . GERD (gastroesophageal reflux disease)   . Gynecological examination    sees Dr. Cherylann Banas   . History of cardiac catheterization 07-24-07   showed nonconclusive disease  . Hyperlipidemia   .  Hypertension   . Hypothyroidism   . Migraines    "have a history of migraines; haven't had one for years" (10/24/2011)  . Myocardial infarction [facility]) 2001?    Patient Active Problem List   Diagnosis Date Noted  . Generalized abdominal cramping 11/20/2018  . Asthma 03/17/2018  . Bilateral leg edema 03/17/2018  . Low back pain radiating to right leg 08/14/2017  . Super obesity 05/16/2017  . Chest pain with moderate risk for cardiac etiology 02/19/2017  . Chest pain 10/24/2011  . LIPOMA 09/12/2008  . Coronary atherosclerosis 07/30/2007  . ANEMIA-NOS 11/10/2006  . MENOPAUSAL SYNDROME 11/10/2006  . HEADACHE 11/10/2006  . Hypothyroidism 09/25/2006  . Hypercholesterolemia 09/25/2006  . Essential hypertension 09/25/2006  . ALLERGIC RHINITIS 09/25/2006  . GERD 09/25/2006    Past Surgical History:  Procedure Laterality Date  . CARDIAC CATHETERIZATION  2009  . CESAREAN SECTION  1984  . COLONOSCOPY  10-06-08   per Dr. Henrene Pastor, benign polyps, repeat in 10 yrs   . ESOPHAGOGASTRODUODENOSCOPY  05/29/2017   per Dr. Henrene Pastor, normal except slight gastritis   . HERNIA REPAIR  ~ 2007   ventral hernia repair  . THYROIDECTOMY  1990's  . TONSILLECTOMY     "when I was a kid"     OB History   No obstetric history on file.      Home Medications    Prior to Admission medications   Medication Sig Start Date End Date Taking? Authorizing Provider  albuterol (PROVENTIL) (2.5 MG/3ML) 0.083% nebulizer solution Take 3 mLs (2.5 mg total) by nebulization every 4 (four) hours as needed for wheezing or shortness of breath. 03/17/18   Laurey Morale, MD  Albuterol Sulfate (PROAIR RESPICLICK) 062 (90 Base) MCG/ACT AEPB Inhale 1-2 puffs into the lungs every 6 (six) hours. 11/25/17   Tereasa Coop, PA-C  amoxicillin-clavulanate (AUGMENTIN) 875-125 MG tablet Take 1 tablet by mouth 2 (two) times daily. 03/17/18   Laurey Morale, MD  aspirin 81 MG tablet Take 81 mg by mouth at bedtime.     [provider]  atorvastatin (LIPITOR) 80 MG tablet TAKE 1 TABLET BY MOUTH ONCE DAILY 07/28/18   Laurey Morale, MD  azelastine (ASTELIN) 0.1 % nasal spray Place 1 spray into both nostrils 2 (two) times daily. Use in each nostril as directed 02/19/18   Shella Maxim, NP  Black Cohosh 40 MG CAPS Take 1 capsule by mouth daily.      [provider]  CALCIUM PO Take 1,800 mg by mouth at bedtime.    [provider]  cetirizine (ZYRTEC) 10 MG tablet Take 10 mg by mouth daily.    [provider]  cyclobenzaprine (FLEXERIL) 10 MG tablet Take 1 tablet (10 mg total) by mouth 3 (three) times daily as needed for muscle spasms. 11/20/18   Nafziger, Tommi Rumps, NP  fluticasone (FLOVENT HFA) 110 MCG/ACT inhaler Inhale 2 puffs into the lungs 2 (two) times daily. 03/17/18   Laurey Morale, MD  furosemide (LASIX) 20 MG tablet Take 1 tablet (20 mg total) by mouth daily. 03/17/18   Laurey Morale, MD  KRILL OIL PO Take 1,000 mg by mouth at bedtime.     [provider]  levothyroxine (SYNTHROID, LEVOTHROID) 200 MCG tablet TAKE 1 TABLET BY MOUTH ONCE DAILY 04/07/18   Laurey Morale, MD  losartan (COZAAR) 100 MG tablet Take 1.5 tablets (150 mg total) by mouth daily. 04/01/18   Bensimhon, Shaune Pascal, MD  meloxicam (MOBIC) 15 MG tablet TAKE 1 TABLET BY MOUTH DAILY. 11/12/18   Laurey Morale, MD  methylPREDNISolone (MEDROL DOSEPAK) 4 MG TBPK tablet Take as directed 11/20/18   Dorothyann Peng, NP  metoprolol tartrate (LOPRESSOR) 25 MG tablet Take 0.5 tablets (12.5 mg total) by mouth 2 (two) times daily. 03/18/18   Laurey Morale, MD  Multiple Vitamin (MULTIVITAMIN) tablet Take 1 tablet by mouth at bedtime.     [provider]  ofloxacin (OCUFLOX) 0.3 % ophthalmic solution Place 1 drop into both eyes every 6 (six) hours. For 5 days 03/22/18   Benjamine Mola, FNP  omeprazole (PRILOSEC) 40 MG capsule TAKE 1 CAPSULE BY MOUTH 2 TIMES DAILY. 07/07/18   Laurey Morale, MD  spironolactone (ALDACTONE) 25 MG tablet Take  1 tablet (25 mg total) by mouth daily. 04/01/18   Bensimhon, Shaune Pascal, MD  temazepam (RESTORIL) 15 MG capsule Take 2 capsules (30 mg total) by mouth at bedtime. 09/07/18   Laurey Morale, MD  tobramycin-dexamethasone [facility] ophthalmic solution Place 2 drops into the left eye every 4 (four) hours while awake. 04/28/18   Laurey Morale, MD    Family History Family History  Problem Relation Age of Onset  . Breast cancer Mother   . Pulmonary fibrosis Mother   . Coronary artery disease Father   . Hypertension Father   . Cancer Other        breast,colon,prostate  . Alcohol abuse Other   . Hyperlipidemia  Other   . Hypertension Other   . Stroke Other   . Heart disease Other   . COPD Other   . Diabetes Other   . Stomach cancer Paternal Grandmother   . Colon cancer Paternal Grandfather     Social History Social History   Tobacco Use  . Smoking status: Former Smoker    Packs/day: 1.00    Years: 30.00    Pack years: 30.00    Types: Cigarettes    Quit date: 10/24/2006    Years since quitting: 12.0  . Smokeless tobacco: Never Used  Substance Use Topics  . Alcohol use: No    Alcohol/week: 0.0 standard drinks  . Drug use: No     Allergies   Sulfa antibiotics and Sulfamethoxazole   Review of Systems Review of Systems  Musculoskeletal:       Cramping  All other systems reviewed and are negative.    Physical Exam Updated Vital Signs BP 126/60   Pulse 70   Temp 98.1 F (36.7 C) (Oral)   Resp 16   Ht 5' 7"  (1.702 m)   Wt (!) 143.3 kg   SpO2 97%   BMI 49.49 kg/m   Physical Exam Vitals signs and nursing note reviewed.  Constitutional:      General: She is not in acute distress.    Appearance: She is well-developed.     Comments: NAD.  HENT:     Head: Normocephalic and atraumatic.     Right Ear: External ear normal.     Left Ear: External ear normal.     Nose: Nose normal.  Eyes:     General: No scleral icterus.    Conjunctiva/sclera: Conjunctivae normal.   Neck:     Musculoskeletal: Normal range of motion and neck supple.  Cardiovascular:     Rate and Rhythm: Normal rate and regular rhythm.     Heart sounds: Normal heart sounds.  Pulmonary:     Effort: Pulmonary effort is normal.     Breath sounds: Wheezing present.     Comments: Faint end expiratory wheezing to posterior lower lobes bilaterally Musculoskeletal: Normal range of motion.        General: No deformity.  Skin:    General: Skin is warm and dry.     Capillary Refill: Capillary refill takes less than 2 seconds.  Neurological:     Mental Status: She is alert and oriented to person, place, and time.     Comments: Facial movements symmetric.  Sensation and strength intact in upper and lower extremities.  Muscle compartments in upper and lower extremities soft and nontender.  Psychiatric:        Behavior: Behavior normal.        Thought Content: Thought content normal.        Judgment: Judgment normal.      ED Treatments / Results  Labs (all labs ordered are listed, but only abnormal results are displayed) Labs Reviewed  CBC WITH DIFFERENTIAL/PLATELET - Abnormal; Notable for the following components:      Result Value   Abs Immature Granulocytes 0.08 (*)    All other components within normal limits  BASIC METABOLIC PANEL - Abnormal; Notable for the following components:   Glucose, Bld 140 (*)    BUN 34 (*)    Creatinine, Ser 1.71 (*)    Calcium 6.3 (*)    GFR calc non Af Amer 32 (*)    GFR calc Af Amer 37 (*)    All  other components within normal limits  MAGNESIUM - Abnormal; Notable for the following components:   Magnesium 1.4 (*)    All other components within normal limits  NOVEL CORONAVIRUS, NAA (HOSP ORDER, SEND-OUT TO REF LAB; TAT 18-24 HRS)  PHOSPHORUS  PARATHYROID HORMONE, INTACT (NO CA)  TSH  T4, FREE  ALBUMIN    EKG EKG Interpretation  Date/Time:  Thursday November 26 2018 14:36:09 EDT Ventricular Rate:  72 PR Interval:  178 QRS Duration: 82 QT  Interval:  418 QTC Calculation: 457 R Axis:   -17 Text Interpretation: Normal sinus rhythm Septal infarct , age undetermined Abnormal ECG When compared to prior, no significant changes seen. No STEMI Confirmed by Antony Blackbird [phone]) on 11/26/2018 3:07:46 PM   Radiology No results found.  Procedures .Critical Care Performed by: Kinnie Feil, PA-C Authorized by: Kinnie Feil, PA-C   Critical care provider statement:    Critical care time (minutes):  45   Critical care was necessary to treat or prevent imminent or life-threatening deterioration of the following conditions:  Metabolic crisis and renal failure   Critical care was time spent personally by me on the following activities:  Discussions with consultants, evaluation of patient's response to treatment, examination of patient, ordering and performing treatments and interventions, ordering and review of laboratory studies, ordering and review of radiographic studies, pulse oximetry, re-evaluation of patient's condition, obtaining history from patient or surrogate, review of old charts and development of treatment plan with patient or surrogate   I assumed direction of critical care for this patient from another provider in my specialty: no     (including critical care time)  Medications Ordered in ED Medications  calcium gluconate 1 g/ 50 mL sodium chloride IVPB (1,000 mg Intravenous New Bag/Given 11/26/18 1804)  sodium chloride 0.9 % bolus 1,000 mL (1,000 mLs Intravenous New Bag/Given 11/26/18 1658)  magnesium sulfate IVPB 2 g 50 mL (0 g Intravenous Stopped 11/26/18 1801)  ibuprofen (ADVIL) tablet 800 mg (800 mg Oral Given 11/26/18 1727)     Initial Impression / Assessment and Plan / ED Course  I have reviewed the triage vital signs and the nursing notes.  Pertinent labs & imaging results that were available during my care of the patient were reviewed by me and considered in my medical decision making (see chart  for details).  Clinical Course as of Nov 25 1832  Thu Nov 26, 2018  1635 Calcium(!!): 6.3 [CG]  1636 Creatinine(!): 1.71 [CG]  1636 GFR, Est Non African American(!): 32 [CG]  1636 Magnesium(!): 1.4 [CG]    Clinical Course User Index [CG] Kinnie Feil, PA-C   EMR reviewed.  I have reviewed patient's telemedicine visit and outpatient labs.  ER work-up reviewed and interpreted by me.  Remarkable as above.   Hypocalcemia, hypomagnesemia and elevated creatinine and low GFR.  Her creatinine was normal March 2020.  DDX includes diuretic related AKI, hypocalcemia.  She has history of goiters s/p thyroidectomy compliant with Synthroid.  Given PCP concern, worsening creatinine in setting of Lasix use will discuss with hospitalist for admission.  She is otherwise stable without seizures, cardiac or psych symptoms.  I have ordered IV fluid, magnesium, calcium here.  EKG without arrhythmias or hypocalcemic changes.  Renal US pending.   Patient reevaluated and there is no clinical decline.  1834: Spoke to Dr. Posey Pronto who has accepted patient.  Requesting albumin.  Covid swab pending. Final Clinical Impressions(s) / ED Diagnoses   Final diagnoses:  Hypocalcemia  Acute kidney injury [facility])    ED Discharge Orders    None       Arlean Hopping 11/26/18 1834    Tegeler, Gwenyth Allegra, MD 11/27/18 [phone]

## 2018-11-26 NOTE — Telephone Encounter (Signed)
Called pt to give update. Pt verbalized understanding and will proceed to the ED.

## 2018-11-26 NOTE — Telephone Encounter (Signed)
Spoke to pt and answered questions. No further action needed.

## 2018-11-26 NOTE — Telephone Encounter (Signed)
Dr.Fry has been trying to contact endocrinology office to go over pt lab results and low calcium levels. Unable to contact anyone from the eno office. Pt was advised to go to the ED to help get levels up.

## 2018-11-27 DIAGNOSIS — Z882 Allergy status to sulfonamides status: Secondary | ICD-10-CM | POA: Diagnosis not present

## 2018-11-27 DIAGNOSIS — E890 Postprocedural hypothyroidism: Secondary | ICD-10-CM

## 2018-11-27 DIAGNOSIS — Z20828 Contact with and (suspected) exposure to other viral communicable diseases: Secondary | ICD-10-CM | POA: Diagnosis present

## 2018-11-27 DIAGNOSIS — I252 Old myocardial infarction: Secondary | ICD-10-CM | POA: Diagnosis not present

## 2018-11-27 DIAGNOSIS — K219 Gastro-esophageal reflux disease without esophagitis: Secondary | ICD-10-CM | POA: Diagnosis present

## 2018-11-27 DIAGNOSIS — E039 Hypothyroidism, unspecified: Secondary | ICD-10-CM | POA: Diagnosis not present

## 2018-11-27 DIAGNOSIS — Z8249 Family history of ischemic heart disease and other diseases of the circulatory system: Secondary | ICD-10-CM | POA: Diagnosis not present

## 2018-11-27 DIAGNOSIS — I10 Essential (primary) hypertension: Secondary | ICD-10-CM

## 2018-11-27 DIAGNOSIS — I130 Hypertensive heart and chronic kidney disease with heart failure and stage 1 through stage 4 chronic kidney disease, or unspecified chronic kidney disease: Secondary | ICD-10-CM | POA: Diagnosis present

## 2018-11-27 DIAGNOSIS — N179 Acute kidney failure, unspecified: Secondary | ICD-10-CM

## 2018-11-27 DIAGNOSIS — Z803 Family history of malignant neoplasm of breast: Secondary | ICD-10-CM | POA: Diagnosis not present

## 2018-11-27 DIAGNOSIS — N183 Chronic kidney disease, stage 3 unspecified: Secondary | ICD-10-CM

## 2018-11-27 DIAGNOSIS — Z800 Family history of malignant neoplasm of digestive organs: Secondary | ICD-10-CM | POA: Diagnosis not present

## 2018-11-27 DIAGNOSIS — R0602 Shortness of breath: Secondary | ICD-10-CM | POA: Diagnosis not present

## 2018-11-27 DIAGNOSIS — I5032 Chronic diastolic (congestive) heart failure: Secondary | ICD-10-CM | POA: Diagnosis not present

## 2018-11-27 DIAGNOSIS — Z87891 Personal history of nicotine dependence: Secondary | ICD-10-CM | POA: Diagnosis not present

## 2018-11-27 DIAGNOSIS — E782 Mixed hyperlipidemia: Secondary | ICD-10-CM | POA: Diagnosis present

## 2018-11-27 DIAGNOSIS — Z825 Family history of asthma and other chronic lower respiratory diseases: Secondary | ICD-10-CM | POA: Diagnosis not present

## 2018-11-27 DIAGNOSIS — G43909 Migraine, unspecified, not intractable, without status migrainosus: Secondary | ICD-10-CM | POA: Diagnosis present

## 2018-11-27 DIAGNOSIS — E7800 Pure hypercholesterolemia, unspecified: Secondary | ICD-10-CM

## 2018-11-27 DIAGNOSIS — Z833 Family history of diabetes mellitus: Secondary | ICD-10-CM | POA: Diagnosis not present

## 2018-11-27 DIAGNOSIS — J449 Chronic obstructive pulmonary disease, unspecified: Secondary | ICD-10-CM | POA: Diagnosis present

## 2018-11-27 DIAGNOSIS — Z7982 Long term (current) use of aspirin: Secondary | ICD-10-CM | POA: Diagnosis not present

## 2018-11-27 DIAGNOSIS — R05 Cough: Secondary | ICD-10-CM | POA: Diagnosis not present

## 2018-11-27 DIAGNOSIS — I2510 Atherosclerotic heart disease of native coronary artery without angina pectoris: Secondary | ICD-10-CM | POA: Diagnosis present

## 2018-11-27 DIAGNOSIS — Z823 Family history of stroke: Secondary | ICD-10-CM | POA: Diagnosis not present

## 2018-11-27 DIAGNOSIS — E209 Hypoparathyroidism, unspecified: Secondary | ICD-10-CM | POA: Diagnosis present

## 2018-11-27 DIAGNOSIS — G4700 Insomnia, unspecified: Secondary | ICD-10-CM | POA: Diagnosis present

## 2018-11-27 LAB — RENAL FUNCTION PANEL
Albumin: 3.4 g/dL — ABNORMAL LOW (ref 3.5–5.0)
Anion gap: 6 (ref 5–15)
BUN: 29 mg/dL — ABNORMAL HIGH (ref 6–20)
CO2: 28 mmol/L (ref 22–32)
Calcium: 6.4 mg/dL — CL (ref 8.9–10.3)
Chloride: 108 mmol/L (ref 98–111)
Creatinine, Ser: 1.63 mg/dL — ABNORMAL HIGH (ref 0.44–1.00)
GFR calc Af Amer: 39 mL/min — ABNORMAL LOW (ref >60)
GFR calc non Af Amer: 34 mL/min — ABNORMAL LOW (ref >60)
Glucose, Bld: 149 mg/dL — ABNORMAL HIGH (ref 70–99)
Phosphorus: 5.2 mg/dL — ABNORMAL HIGH (ref 2.5–4.6)
Potassium: 4.8 mmol/L (ref 3.5–5.1)
Sodium: 142 mmol/L (ref 135–145)

## 2018-11-27 LAB — HIV ANTIBODY (ROUTINE TESTING W REFLEX): HIV Screen 4th Generation wRfx: NONREACTIVE

## 2018-11-27 LAB — VITAMIN D 25 HYDROXY (VIT D DEFICIENCY, FRACTURES)
VITD: 36.79 ng/mL (ref 30.00–100.00)
Vit D, 25-Hydroxy: 34.61 ng/mL (ref 30–100)

## 2018-11-27 LAB — CBC
HCT: 39.3 % (ref 36.0–46.0)
Hemoglobin: 12.6 g/dL (ref 12.0–15.0)
MCH: 29.4 pg (ref 26.0–34.0)
MCHC: 32.1 g/dL (ref 30.0–36.0)
MCV: 91.6 fL (ref 80.0–100.0)
Platelets: 121 10*3/uL — ABNORMAL LOW (ref 150–400)
RBC: 4.29 MIL/uL (ref 3.87–5.11)
RDW: 13.6 % (ref 11.5–15.5)
WBC: 6.5 10*3/uL (ref 4.0–10.5)
nRBC: 0 % (ref 0.0–0.2)

## 2018-11-27 LAB — BASIC METABOLIC PANEL
Anion gap: 6 (ref 5–15)
BUN: 28 mg/dL — ABNORMAL HIGH (ref 6–20)
CO2: 28 mmol/L (ref 22–32)
Calcium: 7.3 mg/dL — ABNORMAL LOW (ref 8.9–10.3)
Chloride: 106 mmol/L (ref 98–111)
Creatinine, Ser: 1.44 mg/dL — ABNORMAL HIGH (ref 0.44–1.00)
GFR calc Af Amer: 46 mL/min — ABNORMAL LOW (ref >60)
GFR calc non Af Amer: 39 mL/min — ABNORMAL LOW (ref >60)
Glucose, Bld: 139 mg/dL — ABNORMAL HIGH (ref 70–99)
Potassium: 4.7 mmol/L (ref 3.5–5.1)
Sodium: 140 mmol/L (ref 135–145)

## 2018-11-27 LAB — SODIUM, URINE, RANDOM: Sodium, Ur: 81 mmol/L

## 2018-11-27 LAB — CREATININE, URINE, RANDOM: Creatinine, Urine: 75 mg/dL

## 2018-11-27 LAB — MAGNESIUM: Magnesium: 1.7 mg/dL (ref 1.7–2.4)

## 2018-11-27 LAB — NOVEL CORONAVIRUS, NAA: SARS-CoV-2, NAA: NOT DETECTED

## 2018-11-27 LAB — SARS CORONAVIRUS 2 (TAT 6-24 HRS): SARS Coronavirus 2: NEGATIVE

## 2018-11-27 LAB — VITAMIN B12: Vitamin B-12: 316 pg/mL (ref 211–911)

## 2018-11-27 MED ORDER — METHOCARBAMOL 500 MG PO TABS
500.0000 mg | ORAL_TABLET | Freq: Three times a day (TID) | ORAL | Status: DC | PRN
  Administered 2018-11-27: 500 mg via ORAL
  Filled 2018-11-27: qty 1

## 2018-11-27 MED ORDER — ATORVASTATIN CALCIUM 40 MG PO TABS
80.0000 mg | ORAL_TABLET | Freq: Every day | ORAL | Status: DC
  Administered 2018-11-27: 80 mg via ORAL
  Filled 2018-11-27: qty 2

## 2018-11-27 MED ORDER — CALCIUM GLUCONATE-NACL 2-0.675 GM/100ML-% IV SOLN
2.0000 g | Freq: Once | INTRAVENOUS | Status: AC
  Administered 2018-11-27: 2000 mg via INTRAVENOUS
  Filled 2018-11-27: qty 100

## 2018-11-27 MED ORDER — ASPIRIN EC 81 MG PO TBEC
81.0000 mg | DELAYED_RELEASE_TABLET | Freq: Every day | ORAL | Status: DC
  Administered 2018-11-27 (×2): 81 mg via ORAL
  Filled 2018-11-27 (×2): qty 1

## 2018-11-27 MED ORDER — CALCITRIOL 0.25 MCG PO CAPS: 0.2500 ug | ORAL_CAPSULE | Freq: Two times a day (BID) | ORAL | Status: DC

## 2018-11-27 MED ORDER — TEMAZEPAM 15 MG PO CAPS
30.0000 mg | ORAL_CAPSULE | Freq: Every day | ORAL | Status: DC
  Administered 2018-11-27 (×2): 30 mg via ORAL
  Filled 2018-11-27 (×2): qty 2

## 2018-11-27 MED ORDER — LEVOTHYROXINE SODIUM 100 MCG PO TABS
200.0000 ug | ORAL_TABLET | Freq: Every day | ORAL | Status: DC
  Administered 2018-11-27 – 2018-11-28 (×2): 200 ug via ORAL
  Filled 2018-11-27 (×2): qty 2

## 2018-11-27 MED ORDER — CALCITRIOL 0.5 MCG PO CAPS
0.5000 ug | ORAL_CAPSULE | Freq: Two times a day (BID) | ORAL | Status: DC
  Administered 2018-11-27 – 2018-11-28 (×2): 0.5 ug via ORAL
  Filled 2018-11-27 (×3): qty 1

## 2018-11-27 MED ORDER — METOPROLOL TARTRATE 12.5 MG HALF TABLET
12.5000 mg | ORAL_TABLET | Freq: Two times a day (BID) | ORAL | Status: DC
  Administered 2018-11-27 – 2018-11-28 (×4): 12.5 mg via ORAL
  Filled 2018-11-27 (×4): qty 1

## 2018-11-27 NOTE — Progress Notes (Signed)
PROGRESS NOTE    Nancy [name]  QMG:[id number] DOB: May 16, 1955 DOA: 11/26/2018 PCP: Laurey Morale, MD   Brief Narrative: Nancy [name] is a 63 y.o. female with medical history significant for chronic diastolic CHF (EF 89-16%, X4HW by echocardiogram 02/20/2017), nonobstructive CAD, hypertension, hyperlipidemia, hypothyroidism, and CKD stage III.    Assessment & Plan:   Principal Problem:   Hypocalcemia Active Problems:   Hypothyroidism   Hypercholesterolemia   Essential hypertension   Chronic diastolic CHF (congestive heart failure) (HCC)   Hypomagnesemia   Acute-on-chronic kidney injury ([place])   Hypocalcemia Unknown etiology. Possibly related to CKD however PTH is now back and is normal. Also concern for possible side effect from diuretic use. Patient with a history of thyroidectomy (unknown if total or partial). Normal 25 vitamin d level -Calcium gluconate 2 g; recheck calcium this evening and repeat dosing if still extremely low -Follow-up 1,25 vitamin D level  AKI on chronic CKD stage III, unspecified Patient is on many medications that could be affecting creatinine. Creatinine has trended down with initial fluids and holding of losartan, spironolactone and lasix. -Repeat BMP  Chronic diastolic heart failure Patient appears mostly euvolemic. Losartan, lasix and spironolactone held secondary to above. -daily weights and strict in/out -Continue metoprolol  Hypomagnesemia Supplementation given and now resolved. Appears this may not be contributing to hypocalcemia.  CAD Non-obstructive. -Continue aspirin, Lipitor, metoprolol  Hyperlipidemia -Continue Lipitor  Hypothyroidism -Continue Synthroid  Essential hypertension Controlled currently -Continue metoprolol  Insomnia -Continue Restoril   DVT prophylaxis: Lovenox Code Status:   Code Status: Full Code Family Communication: Daughter at bedside Disposition Plan: Discharge pending improvement of calcium and  improvement of symptoms   Consultants:   None  Procedures:   None  Antimicrobials:  None    Subjective: Patient reports persistent fatigue, paresthesias, cramping, although cramping is mildly improved.  Objective: Vitals:   11/26/18 2200 11/26/18 2321 11/27/18 0458 11/27/18 0610  BP: (!) 105/92 137/64  128/62  Pulse: 62 66 63 68  Resp: 16 17 14 18   Temp:  98.3 F (36.8 C)  98.5 F (36.9 C)  TempSrc:  Oral  Oral  SpO2:  96% 99% 98%  Weight:      Height:        Intake/Output Summary (Last 24 hours) at 11/27/2018 1357 Last data filed at 11/27/2018 0100 Gross per 24 hour  Intake 1240 ml  Output --  Net 1240 ml   Filed Weights   11/26/18 1434  Weight: (!) 143.3 kg    Examination:  General exam: Appears calm and comfortable Respiratory system: Clear to auscultation. Respiratory effort normal. Cardiovascular system: S1 & S2 heard, RRR. No murmurs, rubs, gallops or clicks. Gastrointestinal system: Abdomen is nondistended, soft and nontender. No organomegaly or masses felt. Normal bowel sounds heard. Central nervous system: Alert and oriented. No focal neurological deficits. Musculoskeltal: Trace LE edema. No calf tenderness. Negative Chvostek's sign Skin: No cyanosis. No rashes Psychiatry: Judgement and insight appear normal. Mood & affect appropriate.     Data Reviewed: I have personally reviewed following labs and imaging studies  CBC: Recent Labs  Lab 11/23/18 0842 11/26/18 1515 11/27/18 0521  WBC 9.2 7.8 6.5  NEUTROABS 6.1 5.2  --   HGB 14.0 13.1 12.6  HCT 41.4 40.2 39.3  MCV 87.2 89.5 91.6  PLT 216.0 165 388*   Basic Metabolic Panel: Recent Labs  Lab 11/23/18 0842 11/26/18 0929 11/26/18 1515 11/27/18 0521  NA 143  --  141 142  K 4.7  --  4.1 4.8  CL 102  --  106 108  CO2 30  --  25 28  GLUCOSE 117*  --  140* 149*  BUN 33*  --  34* 29*  CREATININE 1.79*  --  1.71* 1.63*  CALCIUM 5.9* 6.2* 6.3* 6.4*  MG 1.2*  --  1.4* 1.7  PHOS  --    --  3.6 5.2*   GFR: Estimated Creatinine Clearance: 54.6 mL/min (A) (by C-G formula based on SCr of 1.63 mg/dL (H)). Liver Function Tests: Recent Labs  Lab 11/23/18 0842 11/26/18 1515 11/27/18 0521  AST 13  --   --   ALT 16  --   --   ALKPHOS 92  --   --   BILITOT 0.5  --   --   PROT 6.6  --   --   ALBUMIN 4.1 3.8 3.4*   No results for input(s): LIPASE, AMYLASE in the last 168 hours. No results for input(s): AMMONIA in the last 168 hours. Coagulation Profile: No results for input(s): INR, PROTIME in the last 168 hours. Cardiac Enzymes: No results for input(s): CKTOTAL, CKMB, CKMBINDEX, TROPONINI in the last 168 hours. BNP (last 3 results) No results for input(s): PROBNP in the last 8760 hours. HbA1C: No results for input(s): HGBA1C in the last 72 hours. CBG: No results for input(s): GLUCAP in the last 168 hours. Lipid Profile: No results for input(s): CHOL, HDL, LDLCALC, TRIG, CHOLHDL, LDLDIRECT in the last 72 hours. Thyroid Function Tests: Recent Labs    11/26/18 1720  TSH 0.794  FREET4 1.04   Anemia Panel: Recent Labs    11/26/18 0929  VITAMINB12 316   Sepsis Labs: No results for input(s): PROCALCITON, LATICACIDVEN in the last 168 hours.  Recent Results (from the past 240 hour(s))  Novel Coronavirus, NAA ([facility])     Status: None   Collection Time: 11/26/18  9:09 AM   Specimen: Nasopharyngeal(NP) swabs in vial transport medium   NASOPHARYNGE  TESTING  Result Value Ref Range Status   SARS-CoV-2, NAA Not Detected Not Detected Final    Comment: Testing was performed using the cobas(R) SARS-CoV-2 test. This nucleic acid amplification test was developed and its performance characteristics determined by [employer]. Nucleic acid amplification tests include PCR and TMA. This test has not been FDA cleared or approved. This test has been authorized by FDA under an Emergency Use Authorization (EUA). This test is only authorized for the duration of time  the declaration that circumstances exist justifying the authorization of the emergency use of in vitro diagnostic tests for detection of SARS-CoV-2 virus and/or diagnosis of COVID-19 infection under section 564(b)(1) of the Act, 21 U.S.C. 326ZTI-4(P) (1), unless the authorization is terminated or revoked sooner. When diagnostic testing is negative, the possibility of a false negative result should be considered in the context of a patient's recent exposures and the presence of clinical signs and symptoms consistent with COVID-19. An individual without symptoms  of COVID-19 and who is not shedding SARS-CoV-2 virus would expect to have a negative (not detected) result in this assay.          Radiology Studies: US Renal  Result Date: 11/26/2018 CLINICAL DATA:  Elevated creatinine EXAM: RENAL / URINARY TRACT ULTRASOUND COMPLETE COMPARISON:  Ultrasound 05/14/2017 FINDINGS: Right Kidney: Renal measurements: 10.9 x 5.6 x 5.6 cm = volume: 180.1 mL . Echogenicity within normal limits. No mass or hydronephrosis visualized. Left Kidney: Renal measurements: 10.7 x 5.2 x 4.4 cm = volume: 129 mL. Echogenicity  within normal limits. No mass or hydronephrosis visualized. Bladder: Appears normal for degree of bladder distention. Other: None IMPRESSION: Negative renal ultrasound. Electronically Signed   By: Donavan Foil M.D.   On: 11/26/2018 19:28        Scheduled Meds:  aspirin EC  81 mg Oral QHS   atorvastatin  80 mg Oral q1800   enoxaparin (LOVENOX) injection  60 mg Subcutaneous Daily   levothyroxine  200 mcg Oral Daily   metoprolol tartrate  12.5 mg Oral BID   sodium chloride flush  3 mL Intravenous Q12H   temazepam  30 mg Oral QHS   Continuous Infusions:  calcium gluconate 2,000 mg (11/27/18 1346)     LOS: 0 days     Cordelia Poche, MD [facility] 11/27/2018, 1:57 PM  If 7PM-7AM, please contact night-coverage [email or web address]

## 2018-11-27 NOTE — Progress Notes (Signed)
Oxygen sats were dropping to 79-84% while sleeping , RR shallow. O2 @2lnc  started and sats stayed above 96%

## 2018-11-28 ENCOUNTER — Inpatient Hospital Stay (HOSPITAL_COMMUNITY): Payer: 59

## 2018-11-28 DIAGNOSIS — I10 Essential (primary) hypertension: Secondary | ICD-10-CM | POA: Diagnosis not present

## 2018-11-28 DIAGNOSIS — E039 Hypothyroidism, unspecified: Secondary | ICD-10-CM

## 2018-11-28 DIAGNOSIS — E7800 Pure hypercholesterolemia, unspecified: Secondary | ICD-10-CM | POA: Diagnosis not present

## 2018-11-28 DIAGNOSIS — N179 Acute kidney failure, unspecified: Secondary | ICD-10-CM | POA: Diagnosis not present

## 2018-11-28 LAB — BASIC METABOLIC PANEL
Anion gap: 7 (ref 5–15)
BUN: 27 mg/dL — ABNORMAL HIGH (ref 6–20)
CO2: 29 mmol/L (ref 22–32)
Calcium: 7.3 mg/dL — ABNORMAL LOW (ref 8.9–10.3)
Chloride: 106 mmol/L (ref 98–111)
Creatinine, Ser: 1.36 mg/dL — ABNORMAL HIGH (ref 0.44–1.00)
GFR calc Af Amer: 49 mL/min — ABNORMAL LOW (ref >60)
GFR calc non Af Amer: 42 mL/min — ABNORMAL LOW (ref >60)
Glucose, Bld: 119 mg/dL — ABNORMAL HIGH (ref 70–99)
Potassium: 4.6 mmol/L (ref 3.5–5.1)
Sodium: 142 mmol/L (ref 135–145)

## 2018-11-28 LAB — MAGNESIUM: Magnesium: 1.9 mg/dL (ref 1.7–2.4)

## 2018-11-28 MED ORDER — MELOXICAM 15 MG PO TABS: ORAL_TABLET | ORAL | Status: DC

## 2018-11-28 MED ORDER — CALCITRIOL 0.25 MCG PO CAPS: 0.2500 ug | ORAL_CAPSULE | Freq: Two times a day (BID) | ORAL | 0 refills | Status: DC

## 2018-11-28 MED ORDER — FUROSEMIDE 10 MG/ML IJ SOLN
20.0000 mg | Freq: Once | INTRAMUSCULAR | Status: AC
  Administered 2018-11-28: 20 mg via INTRAVENOUS
  Filled 2018-11-28: qty 2

## 2018-11-28 MED ORDER — LOSARTAN POTASSIUM 100 MG PO TABS: ORAL_TABLET | ORAL | Status: DC

## 2018-11-28 MED ORDER — IBUPROFEN 200 MG PO TABS: ORAL_TABLET | ORAL | Status: DC

## 2018-11-28 MED ORDER — VITAMIN D2 10 MCG (400 UNIT) PO TABS: 800.0000 [IU] | ORAL_TABLET | Freq: Every day | ORAL | 0 refills | Status: DC

## 2018-11-28 MED ORDER — SUMATRIPTAN SUCCINATE 50 MG PO TABS
50.0000 mg | ORAL_TABLET | Freq: Once | ORAL | Status: AC
  Administered 2018-11-28: 50 mg via ORAL
  Filled 2018-11-28: qty 1

## 2018-11-28 MED ORDER — CALCIUM CARBONATE 1250 (500 CA) MG PO TABS: 1.0000 | ORAL_TABLET | Freq: Three times a day (TID) | ORAL | 0 refills | Status: DC

## 2018-11-28 MED ORDER — CALCIUM CARBONATE 1250 (500 CA) MG PO TABS
1.0000 | ORAL_TABLET | Freq: Three times a day (TID) | ORAL | Status: DC
  Administered 2018-11-28 (×2): 500 mg via ORAL
  Filled 2018-11-28 (×2): qty 1

## 2018-11-28 NOTE — Discharge Instructions (Addendum)
Nancy [name],  You were in the hospital because of low calcium. This is likely related to a parathyroid issue. You have been started on a new medication regimen for your calcium and vitamin D. With regard to your kidney function/heart failure, please hold your losartan (Cozaar) for a few days and have your labs rechecked by your PCP.

## 2018-11-28 NOTE — Progress Notes (Signed)
Pt leaving this afternoon with her daughter. Alert, oriented, and without c/o. Discharge instructions given/explained with pt verbalizing understanding. Pt aware to followup.

## 2018-11-28 NOTE — Discharge Summary (Signed)
Physician Discharge Summary  Nancy [name] HEN:[id number] DOB: 1955/11/08 DOA: 11/26/2018  PCP: Laurey Morale, MD  Admit date: 11/26/2018 Discharge date: 11/28/2018  Admitted From: Home Disposition: Home  Recommendations for Outpatient Follow-up:  1. Follow up with PCP in 1 week 2. Follow up with endocrinologist 3. Please obtain Renal function panel/CBC in one week 4. Please follow up on the following pending results: 1,25 vitamin D  Home Health: None Equipment/Devices: None  Discharge Condition: Stable CODE STATUS: Full code Diet recommendation: Heart healthy   Brief/Interim Summary:  Admission HPI written by Lenore Cordia, MD   Chief Complaint: Abnormal labs  HPI: Nancy [name] is a 63 y.o. female with medical history significant for chronic diastolic CHF (EF 36-14%, E3XV by echocardiogram 02/20/2017), nonobstructive CAD, hypertension, hyperlipidemia, hypothyroidism, and CKD stage III who presents to the ED for management of hypocalcemia and hypomagnesemia.  Patient reports having 4-6 weeks of tingling sensation in all of her fingers and toes.  She initially felt this was related to carpal tunnel syndrome.  She had noticed some decrease sensation associated with this.  She then began to develop intermittent sensations of "charley horses" which could occur in her legs, her arms, or in her face.  These began to become more frequent and worsening over the last 2 weeks.  She has also noticed occasional feeling of a rapid heartbeat.  She has been feeling generally unwell but has not had any fevers, chills, or diaphoresis.  She does report having a sore throat.  She denies any abdominal pain, diarrhea, constipation, or dysuria.   She had a telemedicine visit with her primary care provider and was ordered to obtain lab work. Labs from 11/23/2018 were notable for Calcium 5.9 with albumin 4.1, increased creatinine to 1.79 from 1.41 previously in March, magnesium 1.2, TSH 1.91,  negative antinuclear antibody, ESR 34, CRP <1.0, and negative heavy metals profile.  She was notified of her low calcium and magnesium levels and had subsequent labs collected for vitamin B1, vitamin B12, vitamin D, and PTH.  She was advised to present to the ED for further management of her electrolyte abnormalities.  Hospital course:  Hypocalcemia Likely secondary to hypoparathyroidism. Inappropriately normal PTH level of 29. 25 hydroxy vitamin D level of 34.6. patient initially managed with a total of 2 g of calcium gluconate via IV infusion with improvement of calcium from 6.3 to 6.4. Discussed with endocrinology with recommendations to start Calcitriol 0.25-0.5 mcg BID, calcium supplementation with 500 mg of elemental calcium and to continue vitamin D supplementation with vitamin D2. Patient started on Calcitriol 0.5 mcg BID, given 2g of calcium gluconate via IV and started on calcium carbonate 1250 mg with improvement of calcium from 6.4 to 7.3. Symptoms resolved prior to discharge. 1,25 hydroxy-vitamin D level pending on discharge.  AKI on chronic CKD stage III, unspecified Patient is on many medications that could be affecting creatinine. Creatinine has trended down with initial fluids and holding of losartan, spironolactone and lasix. Repeat metabolic panel to ensure improvement back to baseline.  Chronic diastolic heart failure Patient appears mostly euvolemic. Losartan, lasix and spironolactone held secondary to above. Resume diuretics on discharge. Hold losartan secondary to kidney function.  Hypomagnesemia Supplementation given and now resolved. Appears this may not be contributing to hypocalcemia.  CAD Non-obstructive. Continue aspirin, Lipitor, metoprolol  Hyperlipidemia Continue Lipitor  Hypothyroidism Continue Synthroid  Essential hypertension Continue metoprolol. Hold losartan on discharge pending repeat metabolic panel  Insomnia Continue Restoril  Discharge  Diagnoses:  Principal Problem:   Hypocalcemia Active Problems:   Hypothyroidism   Hypercholesterolemia   Essential hypertension   Chronic diastolic CHF (congestive heart failure) (HCC)   Hypomagnesemia   Acute-on-chronic kidney injury [facility])    Discharge Instructions  Discharge Instructions    Call MD for:  difficulty breathing, headache or visual disturbances   Complete by: As directed    Call MD for:  extreme fatigue   Complete by: As directed    Call MD for:  persistant dizziness or light-headedness   Complete by: As directed    Call MD for:  persistant nausea and vomiting   Complete by: As directed    Increase activity slowly   Complete by: As directed      Allergies as of 11/28/2018      Reactions   Sulfa Antibiotics Hives   Sulfamethoxazole Hives      Medication List    STOP taking these medications   amoxicillin-clavulanate 875-125 MG tablet Commonly known as: AUGMENTIN   azelastine 0.1 % nasal spray Commonly known as: ASTELIN   Calcium 600+D 600-800 MG-UNIT Tabs Generic drug: Calcium Carb-Cholecalciferol   methylPREDNISolone 4 MG Tbpk tablet Commonly known as: MEDROL DOSEPAK   ofloxacin 0.3 % ophthalmic solution Commonly known as: Ocuflox   tobramycin-dexamethasone ophthalmic solution Commonly known as: TOBRADEX     TAKE these medications   Albuterol Sulfate 108 (90 Base) MCG/ACT Aepb Commonly known as: ProAir RespiClick Inhale 1-2 puffs into the lungs every 6 (six) hours. What changed:   when to take this  reasons to take this   albuterol (2.5 MG/3ML) 0.083% nebulizer solution Commonly known as: PROVENTIL Take 3 mLs (2.5 mg total) by nebulization every 4 (four) hours as needed for wheezing or shortness of breath. What changed: Another medication with the same name was changed. Make sure you understand how and when to take each.   aspirin 81 MG tablet Take 81 mg by mouth at bedtime.   atorvastatin 80 MG tablet Commonly known as:  LIPITOR TAKE 1 TABLET BY MOUTH ONCE DAILY   calcitRIOL 0.25 MCG capsule Commonly known as: ROCALTROL Take 1 capsule (0.25 mcg total) by mouth 2 (two) times daily.   calcium carbonate 1250 (500 Ca) MG tablet Commonly known as: OS-CAL - dosed in mg of elemental calcium Take 1 tablet (500 mg of elemental calcium total) by mouth 3 (three) times daily with meals.   cetirizine 10 MG tablet Commonly known as: ZYRTEC Take 10 mg by mouth daily.   diphenhydramine-acetaminophen 25-500 MG Tabs tablet Commonly known as: TYLENOL PM Take 1 tablet by mouth at bedtime.   fluticasone 110 MCG/ACT inhaler Commonly known as: Flovent HFA Inhale 2 puffs into the lungs 2 (two) times daily.   furosemide 20 MG tablet Commonly known as: LASIX Take 1 tablet (20 mg total) by mouth daily.   ibuprofen 200 MG tablet Commonly known as: ADVIL Please hold this medication pending your repeat labs and/or PCP follow-up secondary to kidney function What changed:   how much to take  how to take this  when to take this  reasons to take this  additional instructions   KRILL OIL PO Take 1,000 mg by mouth at bedtime.   levothyroxine 200 MCG tablet Commonly known as: SYNTHROID TAKE 1 TABLET BY MOUTH ONCE DAILY What changed: when to take this   losartan 100 MG tablet Commonly known as: COZAAR Please hold this medication until rechecking labs or follow-up with your PCP What changed:  how much to take  how to take this  when to take this  additional instructions   meloxicam 15 MG tablet Commonly known as: MOBIC Please hold this medication until your kidney function stabilizes or after follow-up with your PCP What changed:   how much to take  how to take this  when to take this  additional instructions   metoprolol tartrate 25 MG tablet Commonly known as: LOPRESSOR Take 0.5 tablets (12.5 mg total) by mouth 2 (two) times daily. What changed:   how much to take  when to take this    multivitamin tablet Take 1 tablet by mouth at bedtime.   omeprazole 40 MG capsule Commonly known as: PRILOSEC TAKE 1 CAPSULE BY MOUTH 2 TIMES DAILY. What changed: See the new instructions.   spironolactone 25 MG tablet Commonly known as: Aldactone Take 1 tablet (25 mg total) by mouth daily.   temazepam 15 MG capsule Commonly known as: RESTORIL Take 2 capsules (30 mg total) by mouth at bedtime.   Vitamin D2 10 MCG (400 UNIT) Tabs Take 800 Units by mouth daily.      Follow-up Information    Laurey Morale, MD. Schedule an appointment as soon as possible for a visit in 1 week(s).   Specialty: Family Medicine Why: Please obtain a renal function panel in 3-5 days Contact information: [place] [address] [phone]        Delrae Rend, MD. Schedule an appointment as soon as possible for a visit in 1 week(s).   Specialty: Endocrinology Why: Please call his office (if insurance allows) to set up an appointment in 1-2 weeks. Contact information: [address] [place] [address] [phone]          Allergies  Allergen Reactions  . Sulfa Antibiotics Hives  . Sulfamethoxazole Hives    Consultations:  None   Procedures/Studies: Dg Chest 2 View  Result Date: 11/28/2018 CLINICAL DATA:  Shortness of breath, coughing EXAM: CHEST - 2 VIEW COMPARISON:  11/27/2017 FINDINGS: The heart size and mediastinal contours are within normal limits. Both lungs are clear. Disc degenerative disease of the thoracic spine. IMPRESSION: No acute abnormality of the lungs. Electronically Signed   By: Eddie Candle M.D.   On: 11/28/2018 11:14   US Renal  Result Date: 11/26/2018 CLINICAL DATA:  Elevated creatinine EXAM: RENAL / URINARY TRACT ULTRASOUND COMPLETE COMPARISON:  Ultrasound 05/14/2017 FINDINGS: Right Kidney: Renal measurements: 10.9 x 5.6 x 5.6 cm = volume: 180.1 mL . Echogenicity within normal limits. No mass or hydronephrosis  visualized. Left Kidney: Renal measurements: 10.7 x 5.2 x 4.4 cm = volume: 129 mL. Echogenicity within normal limits. No mass or hydronephrosis visualized. Bladder: Appears normal for degree of bladder distention. Other: None IMPRESSION: Negative renal ultrasound. Electronically Signed   By: Donavan Foil M.D.   On: 11/26/2018 19:28      Subjective: Had some swelling of her face this morning that resolved with lasix. No other issues. Feels much better with regard to weakness/fatigue and cramps  Discharge Exam: Vitals:   11/28/18 0015 11/28/18 0549  BP:  (!) 142/85  Pulse:    Resp:    Temp:  98.5 F (36.9 C)  SpO2: 97%    Vitals:   11/28/18 0000 11/28/18 0015 11/28/18 0549 11/28/18 1210  BP:   (!) 142/85   Pulse:      Resp:      Temp:   98.5 F (36.9 C)  TempSrc:   Oral   SpO2: (!) 80% 97%  95%  Weight:   (!) 152.1 kg   Height:        General: Pt is alert, awake, not in acute distress Cardiovascular: RRR, S1/S2 +, no rubs, no gallops Respiratory: CTA bilaterally, no wheezing, no rhonchi Abdominal: Soft, NT, ND, bowel sounds + Extremities: no edema, no cyanosis    The results of significant diagnostics from this hospitalization (including imaging, microbiology, ancillary and laboratory) are listed below for reference.     Microbiology: Recent Results (from the past 240 hour(s))  Novel Coronavirus, NAA ([facility])     Status: None   Collection Time: 11/26/18  9:09 AM   Specimen: Nasopharyngeal(NP) swabs in vial transport medium   NASOPHARYNGE  TESTING  Result Value Ref Range Status   SARS-CoV-2, NAA Not Detected Not Detected Final    Comment: Testing was performed using the cobas(R) SARS-CoV-2 test. This nucleic acid amplification test was developed and its performance characteristics determined by [employer]. Nucleic acid amplification tests include PCR and TMA. This test has not been FDA cleared or approved. This test has been authorized by FDA under an  Emergency Use Authorization (EUA). This test is only authorized for the duration of time the declaration that circumstances exist justifying the authorization of the emergency use of in vitro diagnostic tests for detection of SARS-CoV-2 virus and/or diagnosis of COVID-19 infection under section 564(b)(1) of the Act, 21 U.S.C. 244LPN-3(Y) (1), unless the authorization is terminated or revoked sooner. When diagnostic testing is negative, the possibility of a false negative result should be considered in the context of a patient's recent exposures and the presence of clinical signs and symptoms consistent with COVID-19. An individual without symptoms  of COVID-19 and who is not shedding SARS-CoV-2 virus would expect to have a negative (not detected) result in this assay.   SARS CORONAVIRUS 2 (TAT 6-24 HRS) Nasopharyngeal Nasopharyngeal Swab     Status: None   Collection Time: 11/26/18  8:56 PM   Specimen: Nasopharyngeal Swab  Result Value Ref Range Status   SARS Coronavirus 2 NEGATIVE NEGATIVE Final    Comment: (NOTE) SARS-CoV-2 target nucleic acids are NOT DETECTED. The SARS-CoV-2 RNA is generally detectable in upper and lower respiratory specimens during the acute phase of infection. Negative results do not preclude SARS-CoV-2 infection, do not rule out co-infections with other pathogens, and should not be used as the sole basis for treatment or other patient management decisions. Negative results must be combined with clinical observations, patient history, and epidemiological information. The expected result is Negative. Fact Sheet for Patients: SugarRoll.be Fact Sheet for Healthcare Providers: https://www.woods-mathews.com/ This test is not yet approved or cleared by the [place] FDA and  has been authorized for detection and/or diagnosis of SARS-CoV-2 by FDA under an Emergency Use Authorization (EUA). This EUA will remain  in  effect (meaning this test can be used) for the duration of the COVID-19 declaration under Section 56 4(b)(1) of the Act, 21 U.S.C. section 360bbb-3(b)(1), unless the authorization is terminated or revoked sooner. Performed at [facility], [place] [address], [place], [place] [address]      Labs: BNP (last 3 results) Recent Labs    12/02/17 1200  BNP 211.1*   Basic Metabolic Panel: Recent Labs  Lab 11/23/18 0842 11/26/18 0929 11/26/18 1515 11/27/18 0521 11/27/18 1814 11/28/18 0521  NA 143  --  141 142 140 142  K 4.7  --  4.1 4.8 4.7 4.6  CL 102  --  106 108 106 106  CO2 30  --  _0 GLUCOSE 117*  --  140* 149* 139* 119*  BUN 33*  --  34* 29* 28* 27*  CREATININE 1.79*  --  1.71* 1.63* 1.44* 1.36*  CALCIUM 5.9* 6.2* 6.3* 6.4* 7.3* 7.3*  MG 1.2*  --  1.4* 1.7  --  1.9  PHOS  --   --  3.6 5.2*  --   --    Liver Function Tests: Recent Labs  Lab 11/23/18 0842 11/26/18 1515 11/27/18 0521  AST 13  --   --   ALT 16  --   --   ALKPHOS 92  --   --   BILITOT 0.5  --   --   PROT 6.6  --   --   ALBUMIN 4.1 3.8 3.4*   No results for input(s): LIPASE, AMYLASE in the last 168 hours. No results for input(s): AMMONIA in the last 168 hours. CBC: Recent Labs  Lab 11/23/18 0842 11/26/18 1515 11/27/18 0521  WBC 9.2 7.8 6.5  NEUTROABS 6.1 5.2  --   HGB 14.0 13.1 12.6  HCT 41.4 40.2 39.3  MCV 87.2 89.5 91.6  PLT 216.0 165 121*   Cardiac Enzymes: No results for input(s): CKTOTAL, CKMB, CKMBINDEX, TROPONINI in the last 168 hours. BNP: Invalid input(s): POCBNP CBG: No results for input(s): GLUCAP in the last 168 hours. D-Dimer No results for input(s): DDIMER in the last 72 hours. Hgb A1c No results for input(s): HGBA1C in the last 72 hours. Lipid Profile No results for input(s): CHOL, HDL, LDLCALC, TRIG, CHOLHDL, LDLDIRECT in the last 72 hours. Thyroid function studies Recent Labs    11/26/18 1720  TSH 0.794   Anemia work up Recent Labs    11/26/18  0929  VITAMINB12 316   Urinalysis    Component Value Date/Time   COLORURINE YELLOW 11/27/2017 [place] 11/27/2017 1039   LABSPEC 1.020 11/27/2017 1039   PHURINE 6.0 11/27/2017 1039   GLUCOSEU NEGATIVE 11/27/2017 1039   HGBUR NEGATIVE 11/27/2017 1039   HGBUR negative 09/15/2009 0858   BILIRUBINUR NEGATIVE 11/27/2017 1039   BILIRUBINUR N 06/03/2017 1146   [place] 11/27/2017 1039   PROTEINUR N 06/03/2017 1146   UROBILINOGEN 0.2 11/27/2017 1039   NITRITE NEGATIVE 11/27/2017 1039   LEUKOCYTESUR NEGATIVE 11/27/2017 1039   Sepsis Labs Invalid input(s): PROCALCITONIN,  WBC,  LACTICIDVEN Microbiology Recent Results (from the past 240 hour(s))  Novel Coronavirus, NAA ([facility])     Status: None   Collection Time: 11/26/18  9:09 AM   Specimen: Nasopharyngeal(NP) swabs in vial transport medium   NASOPHARYNGE  TESTING  Result Value Ref Range Status   SARS-CoV-2, NAA Not Detected Not Detected Final    Comment: Testing was performed using the cobas(R) SARS-CoV-2 test. This nucleic acid amplification test was developed and its performance characteristics determined by [employer], [employer] and [employer]. Nucleic acid amplification tests include PCR and TMA. This test has not been FDA cleared or approved. This test has been authorized by FDA under an Emergency Use Authorization (EUA). This test is only authorized for the duration of time the declaration that circumstances exist justifying the authorization of the emergency use of in vitro diagnostic tests for detection of SARS-CoV-2 virus and/or diagnosis of COVID-19 infection under section 564(b)(1) of the Act, 21 U.S.C. 767HAL-9(F) (1), unless the authorization is terminated or revoked sooner. When diagnostic testing is negative, the possibility of a false negative  result should be considered in the context of a patient's recent exposures and the presence of clinical signs and symptoms consistent with COVID-19. An  individual without symptoms  of COVID-19 and who is not shedding SARS-CoV-2 virus would expect to have a negative (not detected) result in this assay.   SARS CORONAVIRUS 2 (TAT 6-24 HRS) Nasopharyngeal Nasopharyngeal Swab     Status: None   Collection Time: 11/26/18  8:56 PM   Specimen: Nasopharyngeal Swab  Result Value Ref Range Status   SARS Coronavirus 2 NEGATIVE NEGATIVE Final    Comment: (NOTE) SARS-CoV-2 target nucleic acids are NOT DETECTED. The SARS-CoV-2 RNA is generally detectable in upper and lower respiratory specimens during the acute phase of infection. Negative results do not preclude SARS-CoV-2 infection, do not rule out co-infections with other pathogens, and should not be used as the sole basis for treatment or other patient management decisions. Negative results must be combined with clinical observations, patient history, and epidemiological information. The expected result is Negative. Fact Sheet for Patients: SugarRoll.be Fact Sheet for Healthcare Providers: https://www.woods-mathews.com/ This test is not yet approved or cleared by the [place] FDA and  has been authorized for detection and/or diagnosis of SARS-CoV-2 by FDA under an Emergency Use Authorization (EUA). This EUA will remain  in effect (meaning this test can be used) for the duration of the COVID-19 declaration under Section 56 4(b)(1) of the Act, 21 U.S.C. section 360bbb-3(b)(1), unless the authorization is terminated or revoked sooner. Performed at [facility], [address] [place], [place] [address]      Time coordinating discharge: 35 minutes  SIGNED:   Cordelia Poche, MD [facility] 11/28/2018, 12:08 PM

## 2018-11-29 LAB — CALCIUM, URINE, RANDOM: Calcium, Ur: <0.8 mg/dL

## 2018-11-30 ENCOUNTER — Encounter: Payer: Self-pay | Admitting: *Deleted

## 2018-11-30 ENCOUNTER — Telehealth: Payer: Self-pay | Admitting: *Deleted

## 2018-11-30 ENCOUNTER — Other Ambulatory Visit: Payer: Self-pay | Admitting: *Deleted

## 2018-11-30 LAB — PTH, INTACT AND CALCIUM
Calcium: 6.2 mg/dL — ABNORMAL LOW (ref 8.6–10.4)
PTH: 29 pg/mL (ref 14–64)

## 2018-11-30 LAB — CALCITRIOL (1,25 DI-OH VIT D): Vit D, 1,25-Dihydroxy: 34.8 pg/mL (ref 19.9–79.3)

## 2018-11-30 LAB — VITAMIN B1: Vitamin B1 (Thiamine): 22 nmol/L (ref 8–30)

## 2018-11-30 NOTE — Telephone Encounter (Signed)
Transition Care Management Follow-up Telephone Call   Date discharged? 11/28/2018   How have you been since you were released from the hospital? "I'm okay. Im much better than when I went in"    Do you understand why you were in the hospital? yes   Do you understand the discharge instructions? yes   Where were you discharged to? Home    Items Reviewed:  Medications reviewed: yes  Allergies reviewed: yes  Dietary changes reviewed: no  Referrals reviewed: yes. Patient has called Endocrinologist office and [facility]    Functional Questionnaire:   Activities of Daily Living (ADLs):   She states they are independent in the following: ambulation, bathing and hygiene, feeding, continence, grooming, toileting and dressing States they require assistance with the following: N/A    Any transportation issues/concerns?: no   Any patient concerns? Yes (concerned about the medication changes made in hospital) Specifically a BP medication.  Potassium is still below normal.    Confirmed importance and date/time of follow-up visits scheduled yes  Provider Appointment booked with Dr. Sarajane Jews 12/07/2018 at 8:00 AM   Confirmed with patient if condition begins to worsen call PCP or go to the ER.  Patient was given the office number and encouraged to call back with question or concerns.  : yes

## 2018-11-30 NOTE — Patient Outreach (Signed)
[facility]) Care Management  11/30/2018  Nancy [name] [birth year] [id number]   Transition of care call/case closure   Referral received: 11/30/18 Initial outreach:11/30/18 Insurance: [facility] plan    Subjective: Initial successful telephone call to patient's preferred number in order to complete transition of care assessment; 2 HIPAA identifiers verified. Explained purpose of call and completed transition of care assessment.  States she  is feeling good much better than when she went to hospital. She  states she is up and moving around . She reports  tolerating diet, denies bowel or bladder problems. Patient denies increase in swelling, chest discomfort or increased weakness, denies feeling as if fast heartbeat.Patient reports that she  does not weigh at home, has scales available and has been encouraged to do so, " I run from the scales"  discussed management of heart failure and worsening symptoms of sudden weight gain, increased shortness of breath increased swelling  to notify MD sooner . Patient states that her daughter lives with her and is a Marine scientist and in CRNA school  that checks her blood pressure as needed. Patient states that her daughter is  assisting with her recovery. 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 She reports having intermittent FMLA and has used during recent hospitalization.  She say that  she uses a [facility] outpatient pharmacy.     Objective:  Miguel Medal  was hospitalized at [facility] from 10/29- 11/28/2018 for Hypothyroidism , Hypercalcemia, Chronic Diastolic heart failure , Acute on chronic kidney injury Comorbidities include: GERD, Acute on chronic kidney injury, bilateral leg edema, hypercholestrolemia, Super Obesity, HTN.  She was discharged to home on 11/28/18 without the need for home health services or  DME.   Assessment:  Patient voices good understanding of all discharge instructions.  See transition of care flowsheet for assessment details.   Plan:  Reviewed hospital discharge diagnosis of hypocalcemia, hypercholesterolemia, Heart failure l   and discharge treatment plan using hospital discharge instructions, assessing medication adherence, reviewing problems requiring provider notification, and discussing the importance of follow up with  primary care provider and/or specialists as directed. Reviewed [place]'s announcements that all [place] members will receive the Healthy Lifestyle Premium rate in 2021.  Patient reports being active with [facility] Chronic Condition Management , for heart failure, hypertension, weight management she states receiving monthly phone follow up.   No ongoing care management needs identified so will close case to [place] Management services and route successful outreach letter with [place] Management pamphlet and 24 Hour Nurse Line Magnet to [place] Management clinical pool to be mailed to patient's home address. Thanked patient for their services to [facility].     Joylene Draft, RN, [place] Management Coordinator  [phone]- Mobile [phone]- Toll Free Main Office

## 2018-12-01 LAB — PARATHYROID HORMONE, INTACT (NO CA): PTH: 36 pg/mL (ref 15–65)

## 2018-12-02 ENCOUNTER — Telehealth: Payer: Self-pay | Admitting: Family Medicine

## 2018-12-02 NOTE — Telephone Encounter (Signed)
Called patient about an e-mail received from patient experience.

## 2018-12-07 ENCOUNTER — Telehealth: Payer: Self-pay | Admitting: Family Medicine

## 2018-12-07 ENCOUNTER — Other Ambulatory Visit: Payer: Self-pay

## 2018-12-07 ENCOUNTER — Ambulatory Visit: Payer: 59 | Admitting: Family Medicine

## 2018-12-07 ENCOUNTER — Encounter: Payer: Self-pay | Admitting: Family Medicine

## 2018-12-07 DIAGNOSIS — N1831 Chronic kidney disease, stage 3a: Secondary | ICD-10-CM | POA: Diagnosis not present

## 2018-12-07 DIAGNOSIS — R600 Localized edema: Secondary | ICD-10-CM | POA: Diagnosis not present

## 2018-12-07 DIAGNOSIS — E200 Idiopathic hypoparathyroidism: Secondary | ICD-10-CM | POA: Diagnosis not present

## 2018-12-07 DIAGNOSIS — N183 Chronic kidney disease, stage 3 unspecified: Secondary | ICD-10-CM | POA: Insufficient documentation

## 2018-12-07 DIAGNOSIS — E039 Hypothyroidism, unspecified: Secondary | ICD-10-CM | POA: Diagnosis not present

## 2018-12-07 DIAGNOSIS — I5032 Chronic diastolic (congestive) heart failure: Secondary | ICD-10-CM

## 2018-12-07 DIAGNOSIS — I10 Essential (primary) hypertension: Secondary | ICD-10-CM | POA: Diagnosis not present

## 2018-12-07 LAB — BASIC METABOLIC PANEL
BUN: 21 mg/dL (ref 6–23)
CO2: 29 mEq/L (ref 19–32)
Calcium: 7.9 mg/dL — ABNORMAL LOW (ref 8.4–10.5)
Chloride: 101 mEq/L (ref 96–112)
Creatinine, Ser: 1.35 mg/dL — ABNORMAL HIGH (ref 0.40–1.20)
GFR: 39.88 mL/min — ABNORMAL LOW (ref >60.00)
Glucose, Bld: 119 mg/dL — ABNORMAL HIGH (ref 70–99)
Potassium: 3.6 mEq/L (ref 3.5–5.1)
Sodium: 142 mEq/L (ref 135–145)

## 2018-12-07 LAB — MAGNESIUM: Magnesium: 1.5 mg/dL (ref 1.5–2.5)

## 2018-12-07 MED ORDER — MAGNESIUM OXIDE 400 (241.3 MG) MG PO TABS: 400.0000 mg | ORAL_TABLET | Freq: Every day | ORAL | 3 refills | Status: DC

## 2018-12-07 MED ORDER — HYDROCODONE-ACETAMINOPHEN 5-325 MG PO TABS: 1.0000 | ORAL_TABLET | ORAL | 0 refills | Status: AC | PRN

## 2018-12-07 MED ORDER — METOPROLOL TARTRATE 25 MG PO TABS: 25.0000 mg | ORAL_TABLET | Freq: Two times a day (BID) | ORAL | 3 refills | Status: DC

## 2018-12-07 MED ORDER — ERGOCALCIFEROL 1.25 MG (50000 UT) PO CAPS: 50000.0000 [IU] | ORAL_CAPSULE | ORAL | 0 refills | Status: DC

## 2018-12-07 MED FILL — VIT D2 1.25 MG (50,000 UNIT: 1.25 MG | 84 days supply | Qty: 12 | Fill #0

## 2018-12-07 MED FILL — MAGNESIUM OXIDE 400 MG TAB: 400 (240 MG | 90 days supply | Qty: 90 | Fill #0

## 2018-12-07 MED FILL — HYDROCODON-APAP 5-325: 5-325 | 5 days supply | Qty: 30 | Fill #0

## 2018-12-07 NOTE — Patient Instructions (Signed)
Health Maintenance Due  Topic Date Due  . Hepatitis C Screening  30-Jul-1955  . PAP SMEAR-Modifier  05/29/2013  . INFLUENZA VACCINE  08/29/2018  . COLONOSCOPY  10/07/2018    Depression screen PHQ 2/9 06/25/2017  Decreased Interest 0  Down, Depressed, Hopeless 0  PHQ - 2 Score 0

## 2018-12-07 NOTE — Progress Notes (Signed)
   Subjective:    Patient ID: Nancy [name], female    DOB: 1955-12-25, 63 y.o.   MRN: [id number]  HPI Here to follow up on a hospital stay from 11-26-18 to 11-28-18 for a severely low calcium level. For 6 weeks prior to the admission she was experiencing tingling in the fingers and toes, and then 2 weeks prior to admisison she began to have cramps in the arms, legs, abdomen, and face. The cramps became more severe, so she came into the clinic. Labs that day revealed a calcium of 5.9, a Mg of 1.2, and a creatinine of 1.79. She was sent to the ER where she received 2 mg of IV calcium gluconate and magnesium, and she was stabilized. During the hospital stay her meds were changed to PO, and at DC her calcium was 7.3, Mg was 1.9, and creatinine was 1.39 (at her baseline). She has been taking calcium carbonate 500 mg TID but no magnesium. She feels much better with no muscle cramps, but she is fatigued. She was able to return to work this past weekend. She was referred to see Dr. Rollen Sox for Endocrine care, and she will see him on 12-23-18. Her Losartan was stopped, and although her BP at home has remained stable she feels her heart rate go up at times. Interestingly her PTH was normal at 29.25, but she was still diagnosed with hypoparathyroidism. She was also sent home on Calcitriol 0.25 mg BID.    Review of Systems  Constitutional: Positive for fatigue.  Respiratory: Negative.   Cardiovascular: Negative.   Gastrointestinal: Negative.   Genitourinary: Negative.   Neurological: Negative.        Objective:   Physical Exam Constitutional:      Appearance: Normal appearance. She is not ill-appearing.  Cardiovascular:     Rate and Rhythm: Normal rate and regular rhythm.     Pulses: Normal pulses.     Heart sounds: Normal heart sounds.  Pulmonary:     Effort: Pulmonary effort is normal.     Breath sounds: Normal breath sounds.  Abdominal:     General: Abdomen is flat. Bowel sounds are normal.  There is no distension.     Palpations: Abdomen is soft. There is no mass.     Tenderness: There is no abdominal tenderness. There is no guarding or rebound.     Hernia: No hernia is present.  Musculoskeletal:     Comments: 1+ edema in both ankles   Neurological:     General: No focal deficit present.     Mental Status: She is alert and oriented to person, place, and time.           Assessment & Plan:  She is recovering from hypocalcemia which, in part, seems to be due to hypoparathyroidism. She will continue on Calcitriol and calcium carbonate. She will add Magnesium oxide 400 mg daily. We will add Vitamin D 50,000 units one capsule weekly. She will see Dr. Buddy Duty as above. We will refer her to Dr. Jamal Maes of Nephrology for the CKD. Since we stopped the Losartan we will increase the Metoprolol to 25 mg BID. We will check a BMET and a Mg level today.  Alysia Penna, MD

## 2018-12-07 NOTE — Telephone Encounter (Signed)
Notes recorded by Franco Collet, CMA on 12/07/2018 at 3:23 PM EST  Attempted to call pt but no answer. Left message for patient to return phone call.  ------   Notes recorded by Laurey Morale, MD on 12/07/2018 at 12:39 PM EST  Her renal function is stable, her calcium is up to 7.9, and her magnesium is borderline low. Stay on the medications we discussed today    Pls FU

## 2018-12-08 MED FILL — TEMAZEPAM 15 MG CAPSULE: 15 | 90 days supply | Qty: 180 | Fill #1

## 2018-12-08 MED FILL — LOSARTAN POTASSIUM 100 MG T: 100 | 30 days supply | Qty: 30 | Fill #9

## 2018-12-23 DIAGNOSIS — E039 Hypothyroidism, unspecified: Secondary | ICD-10-CM | POA: Diagnosis not present

## 2018-12-23 DIAGNOSIS — E892 Postprocedural hypoparathyroidism: Secondary | ICD-10-CM | POA: Diagnosis not present

## 2018-12-25 MED FILL — METOPROLOL TARTRATE 25 MG T: 25 | 90 days supply | Qty: 90 | Fill #3

## 2018-12-29 MED FILL — OMEPRAZOLE 40 MG CPDR: 40 | 90 days supply | Qty: 180 | Fill #2

## 2018-12-30 ENCOUNTER — Other Ambulatory Visit: Payer: Self-pay | Admitting: Family Medicine

## 2018-12-30 DIAGNOSIS — Z1231 Encounter for screening mammogram for malignant neoplasm of breast: Secondary | ICD-10-CM

## 2018-12-31 ENCOUNTER — Encounter: Payer: Self-pay | Admitting: Family Medicine

## 2019-01-05 MED ORDER — CALCITRIOL 0.25 MCG PO CAPS: 0.2500 ug | ORAL_CAPSULE | Freq: Two times a day (BID) | ORAL | 3 refills | Status: DC

## 2019-01-05 MED FILL — CALCITRIOL 0.25 MCG CAPS: 0.25 | 90 days supply | Qty: 180 | Fill #0

## 2019-01-05 NOTE — Telephone Encounter (Signed)
This was sent in

## 2019-01-06 MED FILL — LOSARTAN POTASSIUM 100 MG T: 100 | 30 days supply | Qty: 30 | Fill #10

## 2019-01-11 ENCOUNTER — Other Ambulatory Visit: Payer: Self-pay | Admitting: Family Medicine

## 2019-01-11 MED FILL — LEVOTHYROXINE SODIUM 200 MC: 200 | 90 days supply | Qty: 90 | Fill #3

## 2019-01-11 NOTE — Telephone Encounter (Signed)
Last filled 12/07/2018 Last OV 12/07/2018  Ok to fill?

## 2019-01-12 NOTE — Telephone Encounter (Signed)
Since she is requesting this more than once, we will need to start on a pain management program. Set up an in person PMV

## 2019-01-12 NOTE — Telephone Encounter (Signed)
Last filled 12/07/2018 Last OV 12/07/2018  Ok to fill?

## 2019-01-13 NOTE — Telephone Encounter (Signed)
See my previous note. She needs a PMV (in person)

## 2019-01-15 NOTE — Telephone Encounter (Signed)
LM 12/18

## 2019-01-18 MED FILL — ATORVASTATIN 80 MG TABLET: 80 | 90 days supply | Qty: 90 | Fill #2

## 2019-01-18 NOTE — Telephone Encounter (Signed)
Lm x3 Unable to reach the patient. Please deny this medication.

## 2019-01-20 ENCOUNTER — Telehealth: Payer: Self-pay | Admitting: *Deleted

## 2019-01-20 ENCOUNTER — Telehealth (INDEPENDENT_AMBULATORY_CARE_PROVIDER_SITE_OTHER): Payer: 59 | Admitting: Family Medicine

## 2019-01-20 ENCOUNTER — Other Ambulatory Visit: Payer: Self-pay

## 2019-01-20 ENCOUNTER — Encounter: Payer: Self-pay | Admitting: Family Medicine

## 2019-01-20 DIAGNOSIS — M79604 Pain in right leg: Secondary | ICD-10-CM

## 2019-01-20 DIAGNOSIS — F1190 Opioid use, unspecified, uncomplicated: Secondary | ICD-10-CM | POA: Diagnosis not present

## 2019-01-20 DIAGNOSIS — M545 Low back pain, unspecified: Secondary | ICD-10-CM

## 2019-01-20 MED ORDER — HYDROCODONE-ACETAMINOPHEN 5-325 MG PO TABS: 1.0000 | ORAL_TABLET | Freq: Four times a day (QID) | ORAL | 0 refills | Status: DC | PRN

## 2019-01-20 MED ORDER — HYDROCODONE-ACETAMINOPHEN 5-325 MG PO TABS: 1.0000 | ORAL_TABLET | Freq: Four times a day (QID) | ORAL | 0 refills | Status: AC | PRN

## 2019-01-20 MED FILL — HYDROCODON-APAP 5-325: 5-325 | 15 days supply | Qty: 60 | Fill #0

## 2019-01-20 NOTE — Addendum Note (Signed)
Addended by: Alysia Penna A on: 01/20/2019 04:28 PM   Modules accepted: Orders

## 2019-01-20 NOTE — Telephone Encounter (Signed)
Patient scheduled for lab appointment. Dr. Barbie Banner CMA will give patient med contract per Dr. Sarajane Jews.  Copied from [place] [id number]. Topic: General - Other >> Jan 20, 2019  4:26 PM Virl Axe D wrote: Reason for CRM: Pt had a virtual appt with Dr. Sarajane Jews today and was advised to schedule a nurse visit tomorrow to sign a contract for pain management. Pt is also coming in tomorrow to give a urine sample. Please advise.

## 2019-01-20 NOTE — Progress Notes (Signed)
Virtual Visit via Video Note  I connected with the patient on 01/20/19 at  4:15 PM EST by a video enabled telemedicine application and verified that I am speaking with the correct person using two identifiers.  Location patient: home Location provider:work or home office Persons participating in the virtual visit: patient, provider  I discussed the limitations of evaluation and management by telemedicine and the availability of in person appointments. The patient expressed understanding and agreed to proceed.   HPI: Here for pain management. Her back pain is stable.  Indication for chronic opioid: low back pain Medication and dose: Norco 5-325 # pills per month: 60 Last UDS date: 01-21-19 Opioid Treatment Agreement signed (Y/N): 01-21-19 Opioid Treatment Agreement last reviewed with patient:  01-21-19 NCCSRS reviewed this encounter (include red flags):  01-20-19    ROS: See pertinent positives and negatives per HPI.  Past Medical History:  Diagnosis Date  . Allergic rhinitis    gets shots per Dr. Harold Hedge   . Anemia 2001  . Anginal pain ([place])   . COPD (chronic obstructive pulmonary disease) ([place])    "CXR just showed mild COPD" (10/24/2011)  . Coronary artery disease    had MI in 2001, seees Dr. Fletcher Anon at [facility]  . GERD (gastroesophageal reflux disease)   . Gynecological examination    sees Dr. Cherylann Banas   . History of cardiac catheterization 07-24-07   showed nonconclusive disease  . Hyperlipidemia   . Hypertension   . Hypothyroidism   . Migraines    "have a history of migraines; haven't had one for years" (10/24/2011)  . Myocardial infarction [facility]) 2001?    Past Surgical History:  Procedure Laterality Date  . CARDIAC CATHETERIZATION  2009  . CESAREAN SECTION  1984  . COLONOSCOPY  10-06-08   per Dr. Henrene Pastor, benign polyps, repeat in 10 yrs   . ESOPHAGOGASTRODUODENOSCOPY  05/29/2017   per Dr. Henrene Pastor, normal except slight gastritis   . HERNIA REPAIR  ~ 2007    ventral hernia repair  . THYROIDECTOMY  1990's  . TONSILLECTOMY     "when I was a kid"    Family History  Problem Relation Age of Onset  . Breast cancer Mother   . Pulmonary fibrosis Mother   . Coronary artery disease Father   . Hypertension Father   . Cancer Other        breast,colon,prostate  . Alcohol abuse Other   . Hyperlipidemia Other   . Hypertension Other   . Stroke Other   . Heart disease Other   . COPD Other   . Diabetes Other   . Stomach cancer Paternal Grandmother   . Colon cancer Paternal Grandfather      Current Outpatient Medications:  .  albuterol (PROVENTIL) (2.5 MG/3ML) 0.083% nebulizer solution, Take 3 mLs (2.5 mg total) by nebulization every 4 (four) hours as needed for wheezing or shortness of breath., Disp: 75 mL, Rfl: 11 .  Albuterol Sulfate (PROAIR RESPICLICK) 710 (90 Base) MCG/ACT AEPB, Inhale 1-2 puffs into the lungs every 6 (six) hours. (Patient taking differently: Inhale 1-2 puffs into the lungs every 6 (six) hours as needed (wheezing). ), Disp: 1 each, Rfl: 1 .  aspirin 81 MG tablet, Take 81 mg by mouth at bedtime. , Disp: , Rfl:  .  atorvastatin (LIPITOR) 80 MG tablet, TAKE 1 TABLET BY MOUTH ONCE DAILY (Patient taking differently: Take 80 mg by mouth daily. ), Disp: 90 tablet, Rfl: 3 .  calcitRIOL (ROCALTROL) 0.25 MCG  capsule, Take 1 capsule (0.25 mcg total) by mouth 2 (two) times daily., Disp: 180 capsule, Rfl: 3 .  calcium carbonate (OS-CAL - DOSED IN MG OF ELEMENTAL CALCIUM) 1250 (500 Ca) MG tablet, Take 1 tablet (500 mg of elemental calcium total) by mouth 3 (three) times daily with meals., Disp: 90 tablet, Rfl: 0 .  cetirizine (ZYRTEC) 10 MG tablet, Take 10 mg by mouth daily., Disp: , Rfl:  .  diphenhydramine-acetaminophen (TYLENOL PM) 25-500 MG TABS tablet, Take 1 tablet by mouth at bedtime., Disp: , Rfl:  .  ergocalciferol (VITAMIN D2) 1.25 MG (50000 UT) capsule, Take 1 capsule (50,000 Units total) by mouth once a week., Disp: 12 capsule, Rfl:  0 .  fluticasone (FLOVENT HFA) 110 MCG/ACT inhaler, Inhale 2 puffs into the lungs 2 (two) times daily., Disp: 1 Inhaler, Rfl: 12 .  furosemide (LASIX) 20 MG tablet, Take 1 tablet (20 mg total) by mouth daily., Disp: 90 tablet, Rfl: 3 .  ibuprofen (ADVIL) 200 MG tablet, Please hold this medication pending your repeat labs and/or PCP follow-up secondary to kidney function, Disp: , Rfl:  .  KRILL OIL PO, Take 1,000 mg by mouth at bedtime. , Disp: , Rfl:  .  levothyroxine (SYNTHROID, LEVOTHROID) 200 MCG tablet, TAKE 1 TABLET BY MOUTH ONCE DAILY (Patient taking differently: Take 200 mcg by mouth daily before breakfast. ), Disp: 90 tablet, Rfl: 3 .  magnesium oxide (MAGOX 400) 400 (241.3 Mg) MG tablet, Take 1 tablet (400 mg total) by mouth daily., Disp: 90 tablet, Rfl: 3 .  metoprolol tartrate (LOPRESSOR) 25 MG tablet, Take 1 tablet (25 mg total) by mouth 2 (two) times daily., Disp: 180 tablet, Rfl: 3 .  Multiple Vitamin (MULTIVITAMIN) tablet, Take 1 tablet by mouth at bedtime. , Disp: , Rfl:  .  omeprazole (PRILOSEC) 40 MG capsule, TAKE 1 CAPSULE BY MOUTH 2 TIMES DAILY. (Patient taking differently: Take 40 mg by mouth 2 (two) times daily. ), Disp: 180 capsule, Rfl: 3 .  spironolactone (ALDACTONE) 25 MG tablet, Take 1 tablet (25 mg total) by mouth daily., Disp: 90 tablet, Rfl: 3 .  temazepam (RESTORIL) 15 MG capsule, Take 2 capsules (30 mg total) by mouth at bedtime., Disp: 180 capsule, Rfl: 1  EXAM:  VITALS per patient if applicable:  GENERAL: alert, oriented, appears well and in no acute distress  HEENT: atraumatic, conjunttiva clear, no obvious abnormalities on inspection of external nose and ears  NECK: normal movements of the head and neck  LUNGS: on inspection no signs of respiratory distress, breathing rate appears normal, no obvious gross SOB, gasping or wheezing  CV: no obvious cyanosis  MS: moves all visible extremities without noticeable abnormality  PSYCH/NEURO: pleasant and  cooperative, no obvious depression or anxiety, speech and thought processing grossly intact  ASSESSMENT AND PLAN: Pain management, meds were refilled.  Alysia Penna, MD  Discussed the following assessment and plan:  Low back pain radiating to right leg     I discussed the assessment and treatment plan with the patient. The patient was provided an opportunity to ask questions and all were answered. The patient agreed with the plan and demonstrated an understanding of the instructions.   The patient was advised to call back or seek an in-person evaluation if the symptoms worsen or if the condition fails to improve as anticipated.

## 2019-01-21 ENCOUNTER — Other Ambulatory Visit: Payer: 59

## 2019-01-21 ENCOUNTER — Encounter: Payer: Self-pay | Admitting: Family Medicine

## 2019-01-21 DIAGNOSIS — F1190 Opioid use, unspecified, uncomplicated: Secondary | ICD-10-CM | POA: Diagnosis not present

## 2019-01-21 MED FILL — SPIRONOLACTONE 25 MG TABS: 25 | 90 days supply | Qty: 90 | Fill #3

## 2019-01-24 LAB — PAIN MGMT, PROFILE 8 W/CONF, U
6 Acetylmorphine: NEGATIVE ng/mL
Alcohol Metabolites: NEGATIVE ng/mL (ref <500)
Amphetamines: NEGATIVE ng/mL
Benzodiazepines: NEGATIVE ng/mL
Buprenorphine, Urine: NEGATIVE ng/mL
Cocaine Metabolite: NEGATIVE ng/mL
Codeine: NEGATIVE ng/mL
Creatinine: 27.5 mg/dL
Hydrocodone: 102 ng/mL
Hydromorphone: NEGATIVE ng/mL
MDMA: NEGATIVE ng/mL
Marijuana Metabolite: NEGATIVE ng/mL
Morphine: NEGATIVE ng/mL
Norhydrocodone: 64 ng/mL
Opiates: POSITIVE ng/mL
Oxidant: NEGATIVE ug/mL
Oxycodone: NEGATIVE ng/mL
pH: 5.6 (ref 4.5–9.0)

## 2019-01-25 DIAGNOSIS — E892 Postprocedural hypoparathyroidism: Secondary | ICD-10-CM | POA: Diagnosis not present

## 2019-01-25 DIAGNOSIS — E039 Hypothyroidism, unspecified: Secondary | ICD-10-CM | POA: Diagnosis not present

## 2019-02-04 MED FILL — MELOXICAM 15 MG TABLET: 15 | 90 days supply | Qty: 90 | Fill #1

## 2019-02-11 ENCOUNTER — Telehealth: Payer: Self-pay | Admitting: Family Medicine

## 2019-02-11 ENCOUNTER — Other Ambulatory Visit: Payer: Self-pay | Admitting: *Deleted

## 2019-02-11 MED ORDER — METOPROLOL TARTRATE 25 MG PO TABS: 25.0000 mg | ORAL_TABLET | Freq: Two times a day (BID) | ORAL | 3 refills | Status: DC

## 2019-02-11 MED FILL — METOPROLOL TARTRATE 25 MG T: 25 | 90 days supply | Qty: 180 | Fill #0

## 2019-02-11 NOTE — Telephone Encounter (Signed)
Rx sent to the pharmacy.

## 2019-02-11 NOTE — Telephone Encounter (Signed)
Copied from [place] [id number]. Topic: General - Other >> Feb 11, 2019  9:21 AM Keene Breath wrote: Reason for CRM: Pharmacy called to get an update on the patient's medication script for metoprolol tartrate (LOPRESSOR) 25 MG tablet.  Please call to verify at [phone]

## 2019-02-22 ENCOUNTER — Other Ambulatory Visit: Payer: Self-pay

## 2019-02-22 ENCOUNTER — Ambulatory Visit
Admission: RE | Admit: 2019-02-22 | Discharge: 2019-02-22 | Disposition: A | Discharge status: Home / Self Care | Payer: 59 | Source: Ambulatory Visit | Attending: Family Medicine | Admitting: Family Medicine

## 2019-02-22 DIAGNOSIS — Z1231 Encounter for screening mammogram for malignant neoplasm of breast: Secondary | ICD-10-CM

## 2019-02-24 ENCOUNTER — Other Ambulatory Visit: Payer: Self-pay | Admitting: Family Medicine

## 2019-02-24 MED FILL — VIT D2 1.25 MG (50,000 UNIT: 1.25 MG | 84 days supply | Qty: 12 | Fill #0

## 2019-03-02 MED FILL — MAGNESIUM OXIDE 400 MG TAB: 400 (240 MG | 90 days supply | Qty: 90 | Fill #1

## 2019-03-06 ENCOUNTER — Other Ambulatory Visit: Payer: Self-pay | Admitting: Family Medicine

## 2019-03-08 NOTE — Telephone Encounter (Signed)
Last filled 09/07/2018 Last OV 01/20/2019  Ok to fill?

## 2019-03-10 ENCOUNTER — Encounter: Payer: Self-pay | Admitting: Family Medicine

## 2019-03-10 NOTE — Telephone Encounter (Signed)
Last filled 09/07/2018 Last OV 01/20/2019  Ok to fill?

## 2019-03-11 ENCOUNTER — Telehealth: Payer: Self-pay | Admitting: Family Medicine

## 2019-03-11 MED FILL — FUROSEMIDE 20 MG TABS: 20 | 90 days supply | Qty: 90 | Fill #3

## 2019-03-11 NOTE — Telephone Encounter (Signed)
Patient needs a refill on Restoril- pt has 3 days left worth of medication.

## 2019-03-12 MED ORDER — TEMAZEPAM 15 MG PO CAPS: 30.0000 mg | ORAL_CAPSULE | Freq: Every day | ORAL | 1 refills | Status: DC

## 2019-03-12 MED FILL — TEMAZEPAM 15 MG CAPSULE: 15 | 90 days supply | Qty: 180 | Fill #0

## 2019-03-12 NOTE — Telephone Encounter (Signed)
Rx has been sent in. Patient is aware. 

## 2019-03-12 NOTE — Telephone Encounter (Signed)
Done

## 2019-03-28 MED FILL — OMEPRAZOLE 40 MG CPDR: 40 | 90 days supply | Qty: 180 | Fill #3

## 2019-03-30 MED FILL — CALCITRIOL 0.25 MCG CAPS: 0.25 | 90 days supply | Qty: 180 | Fill #1

## 2019-04-08 ENCOUNTER — Encounter: Payer: Self-pay | Admitting: Family Medicine

## 2019-04-08 ENCOUNTER — Other Ambulatory Visit: Payer: Self-pay | Admitting: Family Medicine

## 2019-04-08 MED FILL — LEVOTHYROXINE SODIUM 200 MC: 200 | 90 days supply | Qty: 90 | Fill #0

## 2019-04-18 MED FILL — ATORVASTATIN 80 MG TABLET: 80 | 90 days supply | Qty: 90 | Fill #3

## 2019-04-19 ENCOUNTER — Encounter: Payer: Self-pay | Admitting: Family Medicine

## 2019-04-20 NOTE — Telephone Encounter (Signed)
I do not have the records but he saw [facility] on [address]

## 2019-04-21 ENCOUNTER — Other Ambulatory Visit: Payer: Self-pay | Admitting: *Deleted

## 2019-04-21 ENCOUNTER — Encounter: Payer: Self-pay | Admitting: Family Medicine

## 2019-04-21 MED ORDER — SPIRONOLACTONE 25 MG PO TABS: 25.0000 mg | ORAL_TABLET | Freq: Every day | ORAL | 3 refills | Status: DC

## 2019-04-21 MED FILL — SPIRONOLACTONE 25 MG TABS: 25 | 90 days supply | Qty: 90 | Fill #0

## 2019-04-21 NOTE — Telephone Encounter (Signed)
Yes please refill this for one year. I answered her question in another phone note. The dermatologist was Dr. Nena Polio

## 2019-05-05 MED FILL — MELOXICAM 15 MG TABLET: 15 | 90 days supply | Qty: 90 | Fill #2

## 2019-05-06 MED FILL — METOPROLOL TARTRATE 25 MG T: 25 | 90 days supply | Qty: 180 | Fill #1

## 2019-05-20 ENCOUNTER — Encounter: Payer: Self-pay | Admitting: Family Medicine

## 2019-05-25 ENCOUNTER — Other Ambulatory Visit: Payer: Self-pay | Admitting: Family Medicine

## 2019-05-25 ENCOUNTER — Encounter: Payer: Self-pay | Admitting: Family Medicine

## 2019-05-25 ENCOUNTER — Telehealth (INDEPENDENT_AMBULATORY_CARE_PROVIDER_SITE_OTHER): Payer: 59 | Admitting: Family Medicine

## 2019-05-25 DIAGNOSIS — M545 Low back pain, unspecified: Secondary | ICD-10-CM

## 2019-05-25 DIAGNOSIS — M79604 Pain in right leg: Secondary | ICD-10-CM

## 2019-05-25 DIAGNOSIS — F1190 Opioid use, unspecified, uncomplicated: Secondary | ICD-10-CM

## 2019-05-25 MED ORDER — FUROSEMIDE 20 MG PO TABS: 20.0000 mg | ORAL_TABLET | Freq: Every day | ORAL | 3 refills | Status: DC

## 2019-05-25 MED ORDER — HYDROCODONE-ACETAMINOPHEN 5-325 MG PO TABS: 1.0000 | ORAL_TABLET | Freq: Four times a day (QID) | ORAL | 0 refills | Status: DC | PRN

## 2019-05-25 MED ORDER — HYDROCODONE-ACETAMINOPHEN 5-325 MG PO TABS: 1.0000 | ORAL_TABLET | Freq: Four times a day (QID) | ORAL | 0 refills | Status: AC | PRN

## 2019-05-25 MED FILL — FUROSEMIDE 20 MG TABS: 20 | 90 days supply | Qty: 90 | Fill #0

## 2019-05-25 MED FILL — HYDROCODON-APAP 5-325: 5-325 | 15 days supply | Qty: 60 | Fill #0

## 2019-05-25 NOTE — Progress Notes (Signed)
Subjective:    Patient ID: Nancy [name], female    DOB: [birth year], 64 y.o.   MRN: [id number]  HPI Virtual Visit via Video Note  I connected with the patient on 05/25/19 at  9:30 AM EDT by a video enabled telemedicine application and verified that I am speaking with the correct person using two identifiers.  Location patient: home Location provider:work or home office Persons participating in the virtual visit: patient, provider  I discussed the limitations of evaluation and management by telemedicine and the availability of in person appointments. The patient expressed understanding and agreed to proceed.   HPI: Here for pain management, she is doing well.  Indication for chronic opioid: low back pain Medication and dose: Norco 5-325 # pills per month: 60 Last UDS date: 01-21-19 Opioid Treatment Agreement signed (Y/N): 01-21-19 Opioid Treatment Agreement last reviewed with patient:  05-25-19 NCCSRS reviewed this encounter (include red flags): Yes    ROS: See pertinent positives and negatives per HPI.  Past Medical History:  Diagnosis Date  . Allergic rhinitis    gets shots per Dr. Harold Hedge   . Anemia 2001  . Anginal pain ([place])   . COPD (chronic obstructive pulmonary disease) ([place])    "CXR just showed mild COPD" (10/24/2011)  . Coronary artery disease    had MI in 2001, seees Dr. Fletcher Anon at [facility]  . GERD (gastroesophageal reflux disease)   . Gynecological examination    sees Dr. Cherylann Banas   . History of cardiac catheterization 07-24-07   showed nonconclusive disease  . Hyperlipidemia   . Hypertension   . Hypothyroidism   . Migraines    "have a history of migraines; haven't had one for years" (10/24/2011)  . Myocardial infarction [facility]) 2001?    Past Surgical History:  Procedure Laterality Date  . CARDIAC CATHETERIZATION  2009  . CESAREAN SECTION  1984  . COLONOSCOPY  10-06-08   per Dr. Henrene Pastor, benign polyps, repeat in 10 yrs   .  ESOPHAGOGASTRODUODENOSCOPY  05/29/2017   per Dr. Henrene Pastor, normal except slight gastritis   . HERNIA REPAIR  ~ 2007   ventral hernia repair  . THYROIDECTOMY  1990's  . TONSILLECTOMY     "when I was a kid"    Family History  Problem Relation Age of Onset  . Breast cancer Mother   . Pulmonary fibrosis Mother   . Coronary artery disease Father   . Hypertension Father   . Cancer Other        breast,colon,prostate  . Alcohol abuse Other   . Hyperlipidemia Other   . Hypertension Other   . Stroke Other   . Heart disease Other   . COPD Other   . Diabetes Other   . Stomach cancer Paternal Grandmother   . Colon cancer Paternal Grandfather      Current Outpatient Medications:  .  albuterol (PROVENTIL) (2.5 MG/3ML) 0.083% nebulizer solution, Take 3 mLs (2.5 mg total) by nebulization every 4 (four) hours as needed for wheezing or shortness of breath., Disp: 75 mL, Rfl: 11 .  Albuterol Sulfate (PROAIR RESPICLICK) 702 (90 Base) MCG/ACT AEPB, Inhale 1-2 puffs into the lungs every 6 (six) hours. (Patient taking differently: Inhale 1-2 puffs into the lungs every 6 (six) hours as needed (wheezing). ), Disp: 1 each, Rfl: 1 .  aspirin 81 MG tablet, Take 81 mg by mouth at bedtime. , Disp: , Rfl:  .  atorvastatin (LIPITOR) 80 MG tablet, TAKE 1 TABLET BY MOUTH ONCE DAILY (  Patient taking differently: Take 80 mg by mouth daily. ), Disp: 90 tablet, Rfl: 3 .  calcitRIOL (ROCALTROL) 0.25 MCG capsule, Take 1 capsule (0.25 mcg total) by mouth 2 (two) times daily., Disp: 180 capsule, Rfl: 3 .  calcium carbonate (OS-CAL - DOSED IN MG OF ELEMENTAL CALCIUM) 1250 (500 Ca) MG tablet, Take 1 tablet (500 mg of elemental calcium total) by mouth 3 (three) times daily with meals., Disp: 90 tablet, Rfl: 0 .  cetirizine (ZYRTEC) 10 MG tablet, Take 10 mg by mouth daily., Disp: , Rfl:  .  diphenhydramine-acetaminophen (TYLENOL PM) 25-500 MG TABS tablet, Take 1 tablet by mouth at bedtime., Disp: , Rfl:  .  fluticasone (FLOVENT  HFA) 110 MCG/ACT inhaler, Inhale 2 puffs into the lungs 2 (two) times daily., Disp: 1 Inhaler, Rfl: 12 .  furosemide (LASIX) 20 MG tablet, Take 1 tablet (20 mg total) by mouth daily., Disp: 90 tablet, Rfl: 3 .  [START ON 07/25/2019] HYDROcodone-acetaminophen (NORCO) 5-325 MG tablet, Take 1 tablet by mouth every 6 (six) hours as needed for moderate pain., Disp: 60 tablet, Rfl: 0 .  ibuprofen (ADVIL) 200 MG tablet, Please hold this medication pending your repeat labs and/or PCP follow-up secondary to kidney function, Disp: , Rfl:  .  KRILL OIL PO, Take 1,000 mg by mouth at bedtime. , Disp: , Rfl:  .  levothyroxine (SYNTHROID) 200 MCG tablet, TAKE 1 TABLET BY MOUTH ONCE DAILY, Disp: 90 tablet, Rfl: 3 .  magnesium oxide (MAGOX 400) 400 (241.3 Mg) MG tablet, Take 1 tablet (400 mg total) by mouth daily., Disp: 90 tablet, Rfl: 3 .  metoprolol tartrate (LOPRESSOR) 25 MG tablet, Take 1 tablet (25 mg total) by mouth 2 (two) times daily., Disp: 180 tablet, Rfl: 3 .  Multiple Vitamin (MULTIVITAMIN) tablet, Take 1 tablet by mouth at bedtime. , Disp: , Rfl:  .  omeprazole (PRILOSEC) 40 MG capsule, TAKE 1 CAPSULE BY MOUTH 2 TIMES DAILY. (Patient taking differently: Take 40 mg by mouth 2 (two) times daily. ), Disp: 180 capsule, Rfl: 3 .  spironolactone (ALDACTONE) 25 MG tablet, Take 1 tablet (25 mg total) by mouth daily., Disp: 90 tablet, Rfl: 3 .  temazepam (RESTORIL) 15 MG capsule, Take 2 capsules (30 mg total) by mouth at bedtime., Disp: 180 capsule, Rfl: 1 .  Vitamin D, Ergocalciferol, (DRISDOL) 1.25 MG (50000 UNIT) CAPS capsule, TAKE 1 CAPSULE BY MOUTH ONCE WEEKLY, Disp: 12 capsule, Rfl: 0  EXAM:  VITALS per patient if applicable:  GENERAL: alert, oriented, appears well and in no acute distress  HEENT: atraumatic, conjunttiva clear, no obvious abnormalities on inspection of external nose and ears  NECK: normal movements of the head and neck  LUNGS: on inspection no signs of respiratory distress,  breathing rate appears normal, no obvious gross SOB, gasping or wheezing  CV: no obvious cyanosis  MS: moves all visible extremities without noticeable abnormality  PSYCH/NEURO: pleasant and cooperative, no obvious depression or anxiety, speech and thought processing grossly intact  ASSESSMENT AND PLAN: Pain management, meds were refilled.  Alysia Penna, MD  Discussed the following assessment and plan:  No diagnosis found.     I discussed the assessment and treatment plan with the patient. The patient was provided an opportunity to ask questions and all were answered. The patient agreed with the plan and demonstrated an understanding of the instructions.   The patient was advised to call back or seek an in-person evaluation if the symptoms worsen or if the condition fails  to improve as anticipated.     Review of Systems     Objective:   Physical Exam        Assessment & Plan:

## 2019-05-31 MED FILL — MAGNESIUM OXIDE 400 MG TAB: 400 (240 MG | 90 days supply | Qty: 90 | Fill #2

## 2019-06-01 ENCOUNTER — Encounter: Payer: Self-pay | Admitting: Family Medicine

## 2019-06-01 DIAGNOSIS — D485 Neoplasm of uncertain behavior of skin: Secondary | ICD-10-CM | POA: Diagnosis not present

## 2019-06-01 DIAGNOSIS — L720 Epidermal cyst: Secondary | ICD-10-CM | POA: Diagnosis not present

## 2019-06-01 DIAGNOSIS — L821 Other seborrheic keratosis: Secondary | ICD-10-CM | POA: Diagnosis not present

## 2019-06-24 IMAGING — MG DIGITAL SCREENING BILATERAL MAMMOGRAM WITH CAD
4 series · 4 of 4 positions shown · non-contrast
Comparison: Previous exam(s).

ACR Breast Density Category a: The breast tissue is almost entirely
fatty.

CLINICAL DATA: Screening.

EXAM:
DIGITAL SCREENING BILATERAL MAMMOGRAM WITH CAD

[L CC]
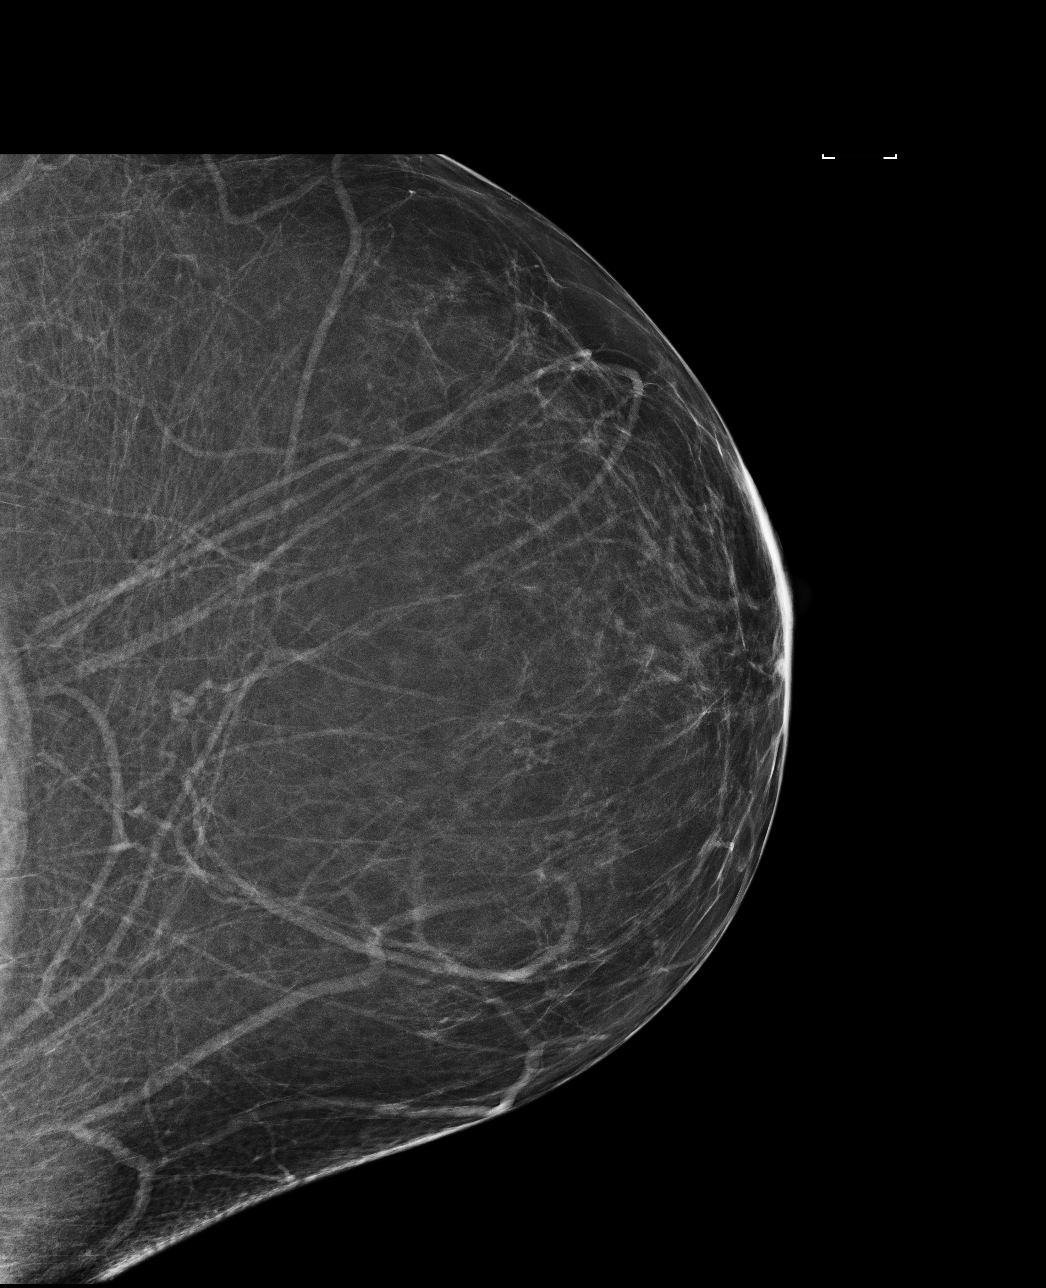

[L MLO]
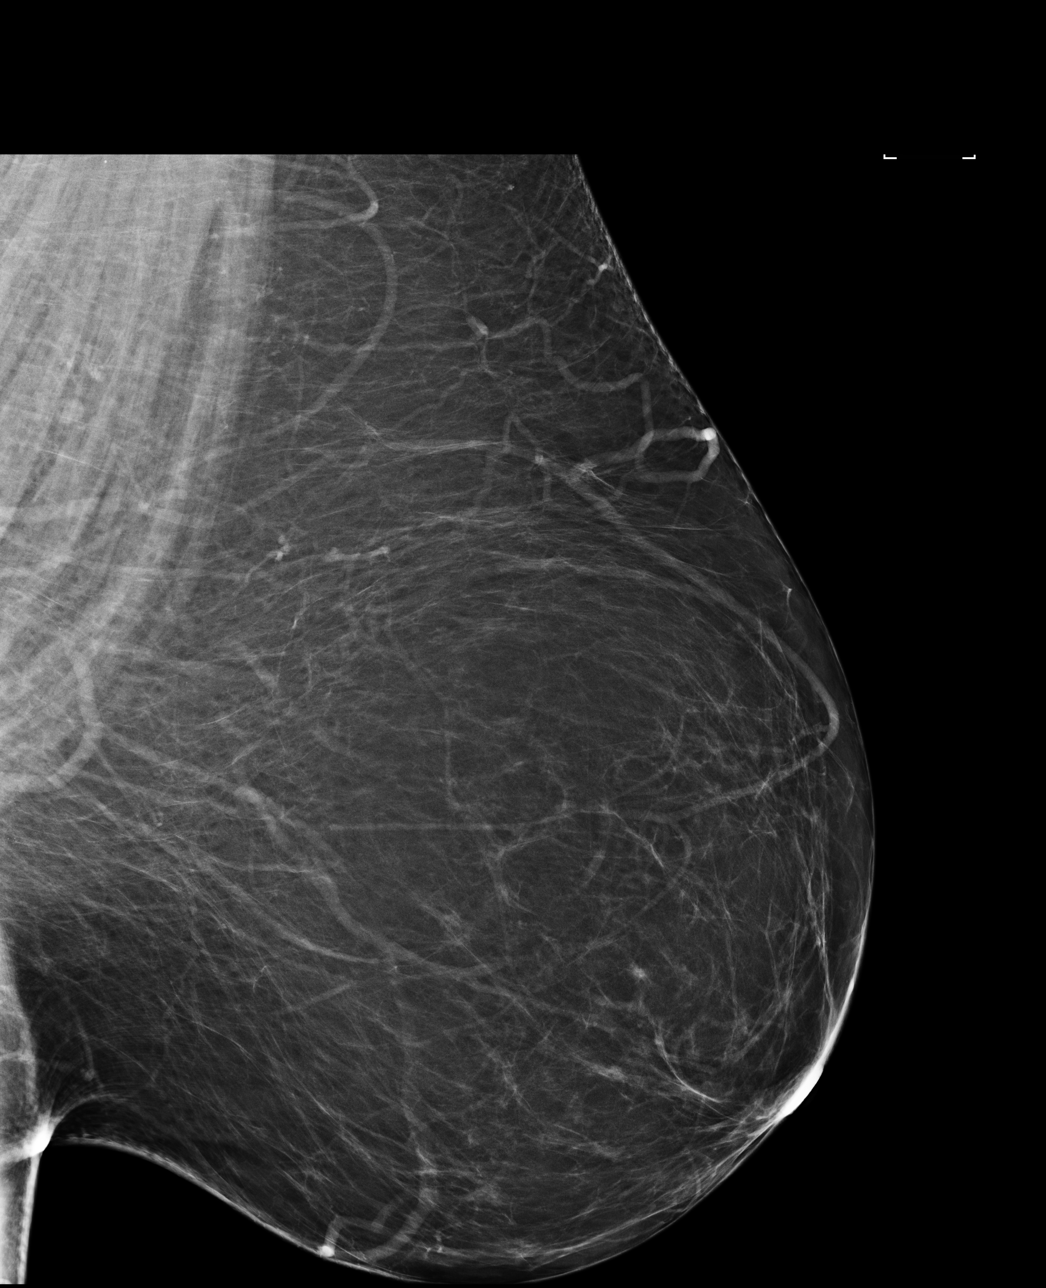

[R MLO]
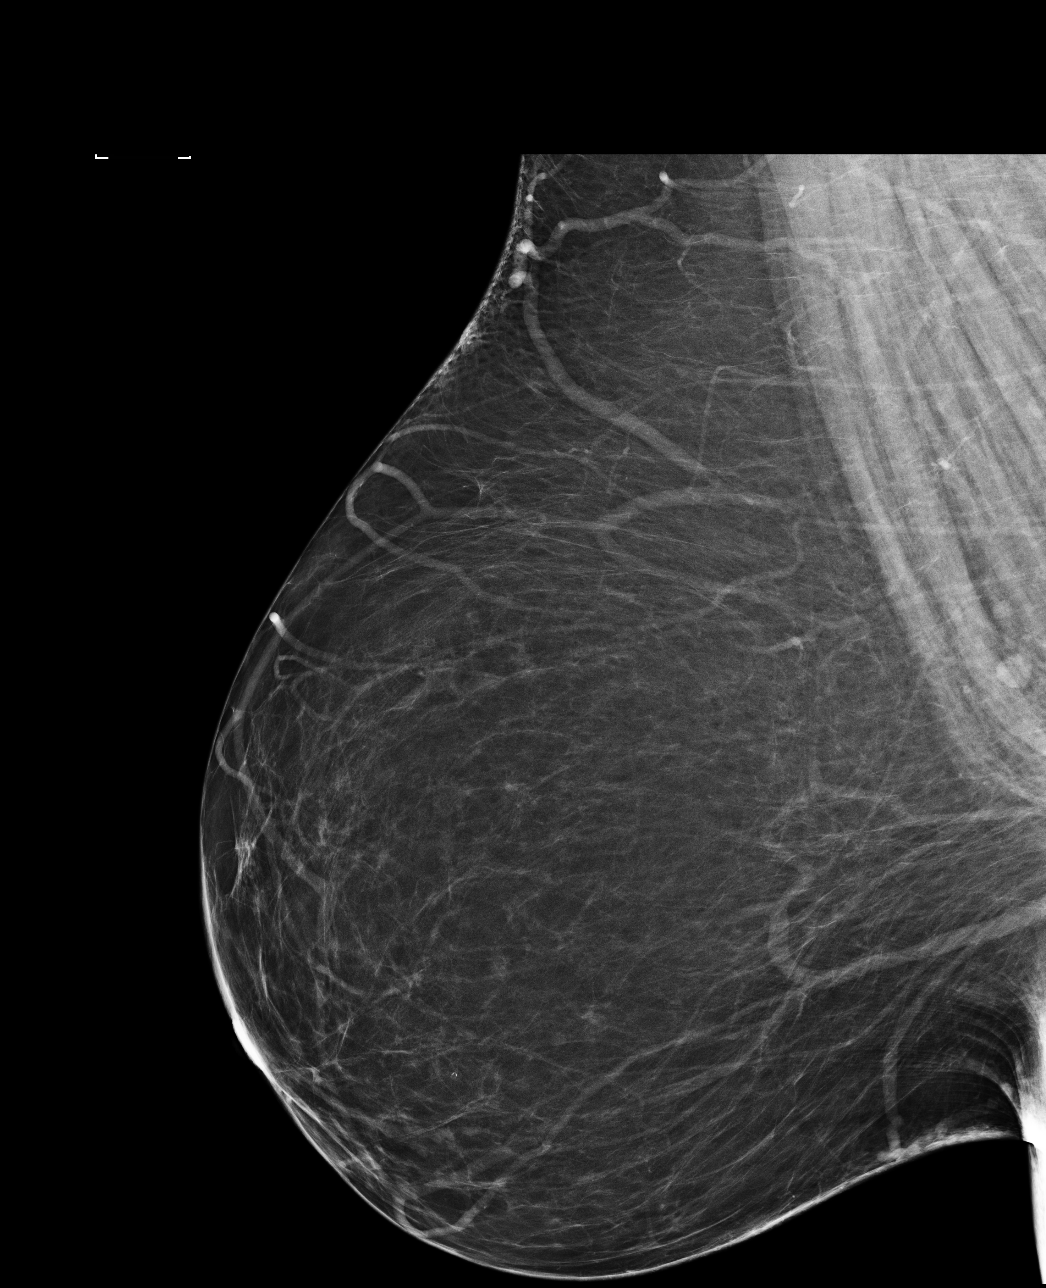

[R CC]
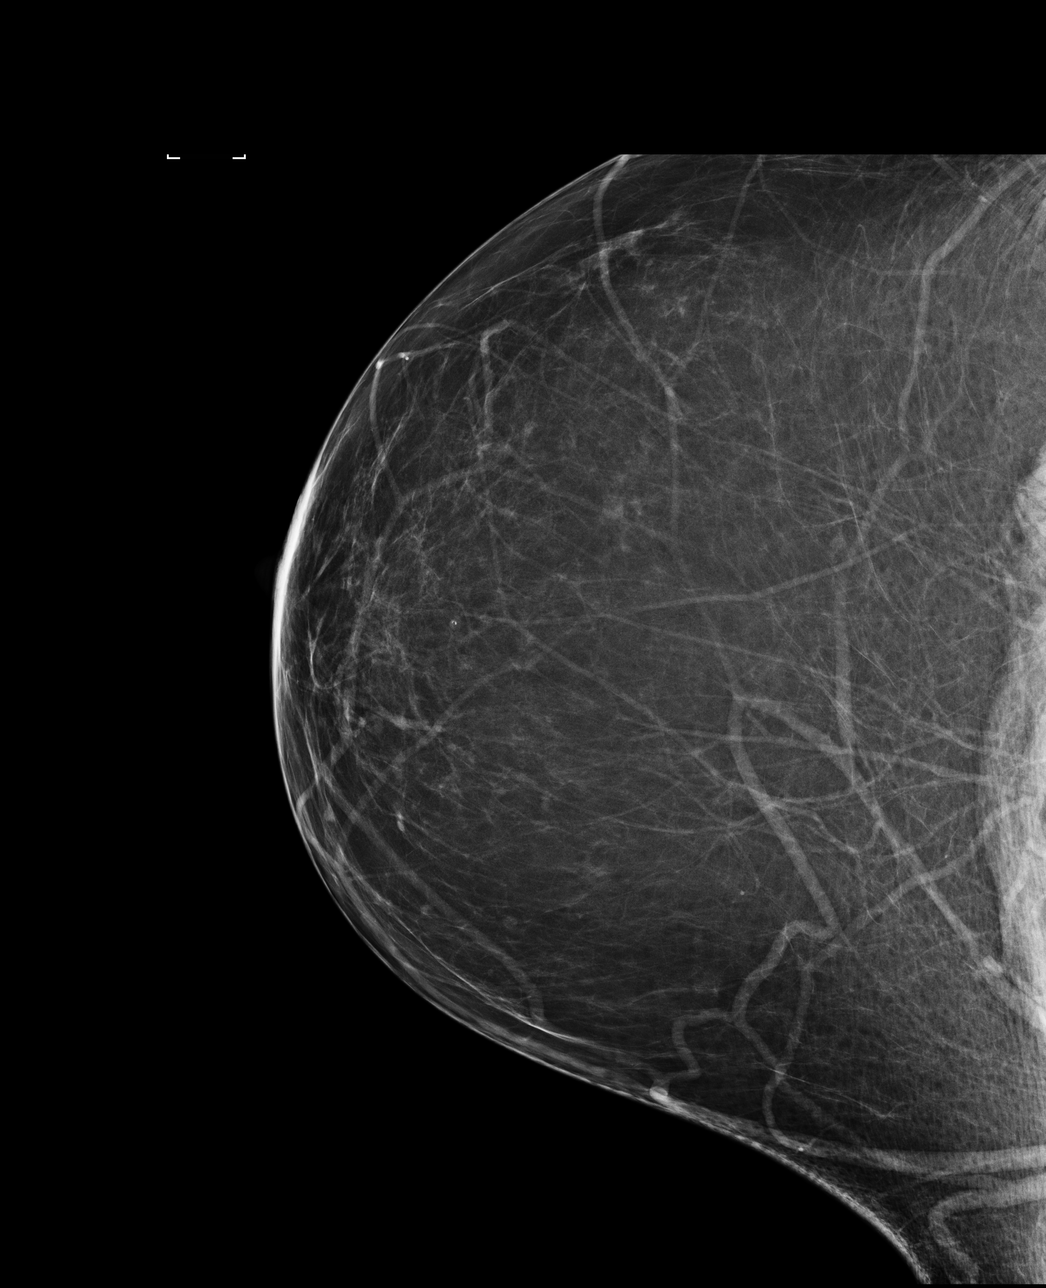

[4 of 4 positions shown; findings below may reference images not displayed]

FINDINGS: There are no findings suspicious for malignancy. Images were
processed with CAD.
IMPRESSION: No mammographic evidence of malignancy. A result letter of this
screening mammogram will be mailed directly to the patient.

RECOMMENDATION:
Screening mammogram in one year. (Code:[id number])

BI-RADS CATEGORY  1: Negative.

## 2019-06-26 ENCOUNTER — Other Ambulatory Visit: Payer: Self-pay | Admitting: Family Medicine

## 2019-06-29 MED FILL — CALCITRIOL 0.25 MCG CAPS: 0.25 | 90 days supply | Qty: 180 | Fill #2

## 2019-06-29 MED FILL — OMEPRAZOLE 40 MG CPDR: 40 | 90 days supply | Qty: 180 | Fill #0

## 2019-07-01 MED FILL — LEVOTHYROXINE SODIUM 200 MC: 200 | 90 days supply | Qty: 90 | Fill #1

## 2019-07-14 MED FILL — SPIRONOLACTONE 25 MG TABS: 25 | 90 days supply | Qty: 90 | Fill #1

## 2019-07-17 ENCOUNTER — Other Ambulatory Visit: Payer: Self-pay | Admitting: Family Medicine

## 2019-07-19 MED FILL — ATORVASTATIN 80 MG TABLET: 80 | 90 days supply | Qty: 90 | Fill #0

## 2019-08-04 MED FILL — METOPROLOL TARTRATE 25 MG T: 25 | 90 days supply | Qty: 180 | Fill #2

## 2019-08-17 MED FILL — FUROSEMIDE 20 MG TABS: 20 | 90 days supply | Qty: 90 | Fill #1

## 2019-08-28 MED FILL — MAGNESIUM OXIDE 400 MG TAB: 400 (240 MG | 90 days supply | Qty: 90 | Fill #3

## 2019-09-06 ENCOUNTER — Encounter: Payer: Self-pay | Admitting: Family Medicine

## 2019-09-07 ENCOUNTER — Other Ambulatory Visit: Payer: Self-pay | Admitting: Family Medicine

## 2019-09-07 MED ORDER — TEMAZEPAM 15 MG PO CAPS: 30.0000 mg | ORAL_CAPSULE | Freq: Every day | ORAL | 1 refills | Status: DC

## 2019-09-07 MED FILL — TEMAZEPAM 15 MG CAPSULE: 15 | 90 days supply | Qty: 180 | Fill #0

## 2019-09-07 NOTE — Telephone Encounter (Signed)
Done

## 2019-09-07 NOTE — Telephone Encounter (Signed)
Last filled 03/12/2019 Last OV 05/25/2019  Ok for refill?

## 2019-09-21 ENCOUNTER — Encounter: Payer: Self-pay | Admitting: Family Medicine

## 2019-09-21 ENCOUNTER — Other Ambulatory Visit: Payer: Self-pay | Admitting: Family Medicine

## 2019-09-21 ENCOUNTER — Ambulatory Visit: Payer: 59 | Admitting: Family Medicine

## 2019-09-21 ENCOUNTER — Other Ambulatory Visit: Payer: Self-pay

## 2019-09-21 VITALS — BP 130/70 | HR 68 | Temp 98.4°F

## 2019-09-21 DIAGNOSIS — E7800 Pure hypercholesterolemia, unspecified: Secondary | ICD-10-CM | POA: Diagnosis not present

## 2019-09-21 DIAGNOSIS — I10 Essential (primary) hypertension: Secondary | ICD-10-CM | POA: Diagnosis not present

## 2019-09-21 DIAGNOSIS — J4540 Moderate persistent asthma, uncomplicated: Secondary | ICD-10-CM

## 2019-09-21 DIAGNOSIS — M791 Myalgia, unspecified site: Secondary | ICD-10-CM

## 2019-09-21 DIAGNOSIS — E039 Hypothyroidism, unspecified: Secondary | ICD-10-CM | POA: Diagnosis not present

## 2019-09-21 DIAGNOSIS — N1831 Chronic kidney disease, stage 3a: Secondary | ICD-10-CM | POA: Diagnosis not present

## 2019-09-21 DIAGNOSIS — R739 Hyperglycemia, unspecified: Secondary | ICD-10-CM | POA: Diagnosis not present

## 2019-09-21 MED ORDER — FLOVENT HFA 110 MCG/ACT IN AERO: 2.0000 | INHALATION_SPRAY | Freq: Two times a day (BID) | RESPIRATORY_TRACT | 11 refills | Status: DC

## 2019-09-21 MED ORDER — ALBUTEROL SULFATE HFA 108 (90 BASE) MCG/ACT IN AERS: 2.0000 | INHALATION_SPRAY | RESPIRATORY_TRACT | 11 refills | Status: DC | PRN

## 2019-09-21 MED ORDER — ALBUTEROL SULFATE (2.5 MG/3ML) 0.083% IN NEBU: 2.5000 mg | INHALATION_SOLUTION | RESPIRATORY_TRACT | 11 refills | Status: DC | PRN

## 2019-09-21 MED FILL — OMEPRAZOLE 40 MG CPDR: 40 | 90 days supply | Qty: 180 | Fill #1

## 2019-09-21 MED FILL — FLOVENT HFA 110 MCG INHALER: 110 | 30 days supply | Qty: 12 | Fill #0

## 2019-09-21 MED FILL — ALBUTEROL SULFATE HFA 108 (: 108 (90 BAS | 17 days supply | Qty: 18 | Fill #0

## 2019-09-21 MED FILL — ALBUTEROL 0.083 MG/ML SOLN: (2.5 MG/3ML | 8 days supply | Qty: 90 | Fill #0

## 2019-09-21 NOTE — Progress Notes (Signed)
   Subjective:    Patient ID: Nancy [name], female    DOB: 1955-08-27, 64 y.o.   MRN: [id number]  HPI Here for a number of issues. First she asks about applying for disability. She has multiple medical issues and especially her asthma/COPD has been causing her great difficulties. She works weekends but she often labors to perform her job duties. She is on her feet most of the time and she often has to stop to catch her breath while working. She needs refills on all her inhalers. Also for the past month she has been getting muscle cramps all over her body. These remind her of how she felt when her hypocalcemia was first found last year.    Review of Systems  Constitutional: Positive for fatigue.  Respiratory: Positive for chest tightness and shortness of breath.   Cardiovascular: Negative.   Gastrointestinal: Negative.   Genitourinary: Negative.   Musculoskeletal: Positive for myalgias.       Objective:   Physical Exam Constitutional:      Appearance: She is obese. She is not ill-appearing.  Cardiovascular:     Rate and Rhythm: Normal rate and regular rhythm.     Pulses: Normal pulses.     Heart sounds: Normal heart sounds.  Pulmonary:     Effort: Pulmonary effort is normal. No respiratory distress.     Breath sounds: Normal breath sounds. No stridor. No wheezing, rhonchi or rales.  Neurological:     General: No focal deficit present.     Mental Status: She is alert and oriented to person, place, and time.           Assessment & Plan:  For her asthma/COPD we refilled her medications. She plans to talk to the [place] office to apply for disability. For the myalgias, we will check calcium and magnesium levels, a CK, and other potential etiologies. Alysia Penna, MD

## 2019-09-22 LAB — BASIC METABOLIC PANEL
BUN/Creatinine Ratio: 14 (calc) (ref 6–22)
BUN: 16 mg/dL (ref 7–25)
CO2: 30 mmol/L (ref 20–32)
Calcium: 8.4 mg/dL — ABNORMAL LOW (ref 8.6–10.4)
Chloride: 104 mmol/L (ref 98–110)
Creat: 1.14 mg/dL — ABNORMAL HIGH (ref 0.50–0.99)
Glucose, Bld: 112 mg/dL — ABNORMAL HIGH (ref 65–99)
Potassium: 4.5 mmol/L (ref 3.5–5.3)
Sodium: 142 mmol/L (ref 135–146)

## 2019-09-22 LAB — CBC WITH DIFFERENTIAL/PLATELET
Absolute Monocytes: 589 cells/uL (ref 200–950)
Basophils Absolute: 57 cells/uL (ref 0–200)
Basophils Relative: 0.8 %
Eosinophils Absolute: 305 cells/uL (ref 15–500)
Eosinophils Relative: 4.3 %
HCT: 46.3 % — ABNORMAL HIGH (ref 35.0–45.0)
Hemoglobin: 15.4 g/dL (ref 11.7–15.5)
Lymphs Abs: 1569 cells/uL (ref 850–3900)
MCH: 29.7 pg (ref 27.0–33.0)
MCHC: 33.3 g/dL (ref 32.0–36.0)
MCV: 89.4 fL (ref 80.0–100.0)
MPV: 8.1 fL (ref 7.5–12.5)
Monocytes Relative: 8.3 %
Neutro Abs: 4580 cells/uL (ref 1500–7800)
Neutrophils Relative %: 64.5 %
Platelets: 163 10*3/uL (ref 140–400)
RBC: 5.18 10*6/uL — ABNORMAL HIGH (ref 3.80–5.10)
RDW: 14.2 % (ref 11.0–15.0)
Total Lymphocyte: 22.1 %
WBC: 7.1 10*3/uL (ref 3.8–10.8)

## 2019-09-22 LAB — HEPATIC FUNCTION PANEL
AG Ratio: 1.7 (calc) (ref 1.0–2.5)
ALT: 31 U/L — ABNORMAL HIGH (ref 6–29)
AST: 19 U/L (ref 10–35)
Albumin: 4.1 g/dL (ref 3.6–5.1)
Alkaline phosphatase (APISO): 73 U/L (ref 37–153)
Bilirubin, Direct: 0.1 mg/dL (ref 0.0–0.2)
Globulin: 2.4 g/dL (calc) (ref 1.9–3.7)
Indirect Bilirubin: 0.4 mg/dL (calc) (ref 0.2–1.2)
Total Bilirubin: 0.5 mg/dL (ref 0.2–1.2)
Total Protein: 6.5 g/dL (ref 6.1–8.1)

## 2019-09-22 LAB — LIPID PANEL
Cholesterol: 172 mg/dL (ref <200)
HDL: 52 mg/dL (ref >=50)
LDL Cholesterol (Calc): 91 mg/dL (calc)
Non-HDL Cholesterol (Calc): 120 mg/dL (calc) (ref <130)
Total CHOL/HDL Ratio: 3.3 (calc) (ref <5.0)
Triglycerides: 197 mg/dL — ABNORMAL HIGH (ref <150)

## 2019-09-22 LAB — TSH: TSH: 0.7 mIU/L (ref 0.40–4.50)

## 2019-09-22 LAB — T3, FREE: T3, Free: 3 pg/mL (ref 2.3–4.2)

## 2019-09-22 LAB — HEMOGLOBIN A1C
Hgb A1c MFr Bld: 6.5 % of total Hgb — ABNORMAL HIGH (ref <5.7)
Mean Plasma Glucose: 140 (calc)
eAG (mmol/L): 7.7 (calc)

## 2019-09-22 LAB — CK: Total CK: 79 U/L (ref 29–143)

## 2019-09-22 LAB — T4, FREE: Free T4: 1.5 ng/dL (ref 0.8–1.8)

## 2019-09-22 LAB — MAGNESIUM: Magnesium: 1.9 mg/dL (ref 1.5–2.5)

## 2019-09-22 LAB — VITAMIN B12: Vitamin B-12: 407 pg/mL (ref 200–1100)

## 2019-09-22 MED ORDER — CALCIUM CITRATE 950 (200 CA) MG PO TABS: 200.0000 mg | ORAL_TABLET | Freq: Three times a day (TID) | ORAL | 0 refills | Status: AC

## 2019-09-22 NOTE — Telephone Encounter (Signed)
Noted  

## 2019-09-27 MED FILL — ALBUTEROL 0.083 MG/ML SOLN: (2.5 MG/3ML | 8 days supply | Qty: 90 | Fill #1

## 2019-09-27 MED FILL — CALCITRIOL 0.25 MCG CAPS: 0.25 | 90 days supply | Qty: 180 | Fill #3

## 2019-09-28 ENCOUNTER — Encounter: Payer: Self-pay | Admitting: Family Medicine

## 2019-09-29 ENCOUNTER — Other Ambulatory Visit: Payer: Self-pay | Admitting: Family Medicine

## 2019-09-29 MED ORDER — ESCITALOPRAM OXALATE 10 MG PO TABS: 10.0000 mg | ORAL_TABLET | Freq: Every day | ORAL | 2 refills | Status: DC

## 2019-09-29 MED FILL — ESCITALOPRAM 10 MG TABLET: 10 | 30 days supply | Qty: 30 | Fill #0

## 2019-09-29 MED FILL — LEVOTHYROXINE SODIUM 200 MC: 200 | 90 days supply | Qty: 90 | Fill #2

## 2019-09-29 NOTE — Telephone Encounter (Signed)
Yes she should apply for short term disability while she is working on permanent disability. I will send in some Lexapro for her to try.

## 2019-10-05 MED FILL — ALBUTEROL 0.083 MG/ML SOLN: (2.5 MG/3ML | 8 days supply | Qty: 90 | Fill #2

## 2019-10-11 MED FILL — ALBUTEROL 0.083 MG/ML SOLN: (2.5 MG/3ML | 5 days supply | Qty: 90 | Fill #3

## 2019-10-11 MED FILL — ATORVASTATIN 80 MG TABLET: 80 | 90 days supply | Qty: 90 | Fill #1

## 2019-10-12 MED FILL — SPIRONOLACTONE 25 MG TABS: 25 | 90 days supply | Qty: 90 | Fill #2

## 2019-10-13 ENCOUNTER — Encounter: Payer: Self-pay | Admitting: Family Medicine

## 2019-10-15 MED FILL — ALBUTEROL 0.083 MG/ML SOLN: (2.5 MG/3ML | 5 days supply | Qty: 90 | Fill #4

## 2019-10-15 MED FILL — FLOVENT HFA 110 MCG INHALER: 110 | 30 days supply | Qty: 12 | Fill #1

## 2019-10-18 ENCOUNTER — Encounter: Payer: Self-pay | Admitting: Family Medicine

## 2019-10-19 MED FILL — ALBUTEROL 0.083 MG/ML SOLN: (2.5 MG/3ML | 5 days supply | Qty: 90 | Fill #5

## 2019-10-20 ENCOUNTER — Encounter: Payer: Self-pay | Admitting: Family Medicine

## 2019-10-20 NOTE — Telephone Encounter (Signed)
Patient called to check on the forms and Dr. Sarajane Jews is out of the office till next week.  The patient is going to bring the forms to the office to make sure that Dr. Sarajane Jews has received the forms.

## 2019-10-23 MED FILL — ESCITALOPRAM 10 MG TABLET: 10 | 30 days supply | Qty: 30 | Fill #1

## 2019-10-24 MED FILL — ALBUTEROL 0.083 MG/ML SOLN: (2.5 MG/3ML | 5 days supply | Qty: 90 | Fill #6

## 2019-10-27 NOTE — Telephone Encounter (Signed)
Patient called and advised forms have been faxed and the original will be at the desk for pickup, she verbalized understanding.

## 2019-10-29 MED FILL — ALBUTEROL 0.083 MG/ML SOLN: (2.5 MG/3ML | 5 days supply | Qty: 90 | Fill #7

## 2019-11-02 MED FILL — METOPROLOL TARTRATE 25 MG T: 25 | 90 days supply | Qty: 180 | Fill #3

## 2019-11-03 MED FILL — ALBUTEROL 0.083 MG/ML SOLN: (2.5 MG/3ML | 5 days supply | Qty: 90 | Fill #8

## 2019-11-08 ENCOUNTER — Encounter: Payer: Self-pay | Admitting: Internal Medicine

## 2019-11-08 MED FILL — ALBUTEROL 0.083 MG/ML SOLN: (2.5 MG/3ML | 5 days supply | Qty: 90 | Fill #9

## 2019-11-14 MED FILL — FLOVENT HFA 110 MCG INHALER: 110 | 30 days supply | Qty: 12 | Fill #2

## 2019-11-15 MED FILL — FUROSEMIDE 20 MG TABS: 20 | 90 days supply | Qty: 90 | Fill #2

## 2019-11-22 MED FILL — ESCITALOPRAM 10 MG TABLET: 10 | 30 days supply | Qty: 30 | Fill #2

## 2019-11-26 ENCOUNTER — Other Ambulatory Visit: Payer: Self-pay | Admitting: Family Medicine

## 2019-11-26 MED FILL — MAGNESIUM OXIDE 400 MG TAB: 400 (240 MG | 90 days supply | Qty: 90 | Fill #0

## 2019-12-07 MED FILL — TEMAZEPAM 15 MG CAPSULE: 15 | 90 days supply | Qty: 180 | Fill #1

## 2019-12-13 ENCOUNTER — Other Ambulatory Visit: Payer: Self-pay

## 2019-12-13 ENCOUNTER — Ambulatory Visit: Payer: 59 | Admitting: Internal Medicine

## 2019-12-13 ENCOUNTER — Encounter: Payer: Self-pay | Admitting: Internal Medicine

## 2019-12-13 VITALS — BP 124/70 | HR 53 | Temp 98.5°F | Ht 67.0 in | Wt 315.4 lb

## 2019-12-13 DIAGNOSIS — R0602 Shortness of breath: Secondary | ICD-10-CM

## 2019-12-13 NOTE — Progress Notes (Signed)
Nancy [name]    [id number]    Jun 03, 1955  Primary Care Physician:Fry, Ishmael Holter, MD  Referring Physician: Laurey Morale, MD [place],  [place] [address] Reason for Consultation: asthma Date of Consultation: 12/13/2019  Chief complaint:   Chief Complaint  Patient presents with  . Consult    COPD/Asthma     HPI: Nancy [name] is a 64 y.o. woman with history of MI and heart failure preserved ejection fraction who presents with dyspnea.  Notes worsening dyspnea with minimal exertion for the past couple of months especially since July 2021. 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 She can't function like she used to anymore. After about 20 minutes of being at [employer] she gets light headed, diaphoretic, short of breath and needs to sit and rest. Occasionally with chest pain and does take nitroglycerin for this and it does help. Occasionally gets wheezing, but no cough. She takes albuterol for this and it helps. She is also on a flovent inhaler but is unsure if this helps. Has been on this for about a year. She has never had PFTs.   She had multiple URIs in Fall 2020 and was treated with multiple rounds of prednisone. Prednisone did not help and she ended up being diagnosed with heart failure.   History of allergy shots - got short of breath, headaches, wheezing. This was about 7-8 years ago.   Diagnosed with Asthma by her PCP earlier this year. Has a history of childhood allergic rhinitis.  She is not interested in CPAP treatment or evaluation.   Current Regimen: flovent, albuterol Asthma Triggers: exercise Exacerbations in the last year: none, last in October 2020 History of hospitalization or intubation: Hives: yes, to sulfa drugs Allergy Testing: yes, previously received allergy shots - allergic to dogs, cats, mold.  GERD: yes, treated with PPI.  Allergic Rhinitis: none currently, but in childhood.  ACT:  Asthma Control  Test ACT Total Score  12/13/2019 12   FeNO: n/a  Social history:  Occupation: respiratory therapist Exposures: lives at home with daughter and grandchildren. Pet dog at home.  Smoking history: quit in 2008. Minimum 30 pack years  Social History   Occupational History  . Occupation: respiratory therapist  Tobacco Use  . Smoking status: Former Smoker    Packs/day: 1.00    Years: 30.00    Pack years: 30.00    Types: Cigarettes    Quit date: 10/24/2006    Years since quitting: 13.1  . Smokeless tobacco: Never Used  Vaping Use  . Vaping Use: Never used  Substance and Sexual Activity  . Alcohol use: No    Alcohol/week: 0.0 standard drinks  . Drug use: No  . Sexual activity: Never    Relevant family history: Family History  Problem Relation Age of Onset  . Breast cancer Mother   . Pulmonary fibrosis Mother   . COPD Mother   . Coronary artery disease Father   . Hypertension Father   . Cancer Other        breast,colon,prostate  . Alcohol abuse Other   . Hyperlipidemia Other   . Hypertension Other   . Stroke Other   . Heart disease Other   . COPD Other   . Diabetes Other   . Stomach cancer Paternal Grandmother   . Colon cancer Paternal Grandfather     Past Medical History:  Diagnosis Date  . Allergic rhinitis  gets shots per Dr. Harold Hedge   . Anemia 2001  . Anginal pain ([place])   . COPD (chronic obstructive pulmonary disease) ([place])    "CXR just showed mild COPD" (10/24/2011)  . Coronary artery disease    had MI in 2001, see[facility] Fletcher [facility]  . GERD (gastroesophageal reflux disease)   . Gynecological examination    sees Dr. Cherylann Banas   . History of cardiac catheterization 07-24-07   showed nonconclusive disease  . Hyperlipidemia   . Hypertension   . Hypothyroidism   . Migraines    "have a history of migraines; haven't had one for years" (10/24/2011)  . Myocardial infarction [facility]) 2001?    Past Surgical History:  Procedure Laterality Date   . CARDIAC CATHETERIZATION  2009  . CESAREAN SECTION  1984  . COLONOSCOPY  10-06-08   per Dr. Henrene Pastor, benign polyps, repeat in 10 yrs   . ESOPHAGOGASTRODUODENOSCOPY  05/29/2017   per Dr. Henrene Pastor, normal except slight gastritis   . HERNIA REPAIR  ~ 2007   ventral hernia repair  . THYROIDECTOMY  1990's  . TONSILLECTOMY     "when I was a kid"     Physical Exam: Blood pressure 124/70, pulse (!) 53, temperature 98.5 F (36.9 C), temperature source Tympanic, height 5' 7"  (1.702 m), weight (!) 315 lb 6 oz (143.1 kg), SpO2 97 %. Gen:      No acute distress ENT:   +cobblestoning in oropharynx, no nasal polyps, mucus membranes moist Lungs:   Breath sounds diminished, No increased respiratory effort, symmetric chest wall excursion, clear to auscultation bilaterally, no wheezes or crackles CV:         Regular rate and rhythm; no murmurs, rubs, or gallops.  Trace pedal edema Abd:      + bowel sounds; soft, non-tender; no distension MSK: no acute synovitis of DIP or PIP joints, no mechanics hands.  Skin:      Warm and dry; no rashes Neuro: normal speech, no focal facial asymmetry Psych: alert and oriented x3, normal mood and affect   Data Reviewed/Medical Decision Making:  Independent interpretation of tests: Imaging: . Review of patient's CT Angio 2019 images revealed enlarged PA, pulmonary edema, no PE. The patient's images have been independently reviewed by me.  Chest xray most recently was clear.   PFTs: None on file  Labs:  Lab Results  Component Value Date   WBC 7.1 09/21/2019   HGB 15.4 09/21/2019   HCT 46.3 (H) 09/21/2019   MCV 89.4 09/21/2019   PLT 163 09/21/2019     Immunization status:  Immunization History  Administered Date(s) Administered  . Influenza, Seasonal, Injecte, Preservative Fre 10/11/2016  . Influenza,inj,Quad PF,6+ Mos 10/07/2017  . Influenza,inj,Quad PF,6-35 Mos 11/15/2019  . Influenza-Unspecified 10/28/2013, 11/22/2014, 10/11/2016  . PFIZER SARS-COV-2  Vaccination 09/29/2019, 10/20/2019  . Tdap 01/14/2012    . I reviewed prior external note(s) from primary care, cardiology . I reviewed the result(s) of the labs and imaging as noted above.  . I have ordered PFTs   Assessment:  Dyspnea on Exertion History of tobacco use Possible Asthma COPD overlap syndrome  Plan/Recommendations: Will obtain a full set of PFTs to further evaluate the nature of her dyspnea and fatigue.  I strongly advise a sleep study to evaluate for OSA, however she declines at this time. Fine to continue flovent and albuterol for now. I will see her back after PFTs.    Return to Care: Return in about 4 weeks (  around 01/10/2020).  Lenice Llamas, MD Pulmonary and [place]  CC: Laurey Morale, MD

## 2019-12-13 NOTE — Patient Instructions (Signed)
The patient should have follow up scheduled with myself in 1 months.   Prior to next visit patient should have: Full set of PFTs

## 2019-12-14 MED FILL — FLOVENT HFA 110 MCG INHALER: 110 | 30 days supply | Qty: 12 | Fill #3

## 2019-12-20 ENCOUNTER — Other Ambulatory Visit: Payer: Self-pay | Admitting: Family Medicine

## 2019-12-20 MED FILL — OMEPRAZOLE 40 MG CPDR: 40 | 90 days supply | Qty: 180 | Fill #0

## 2019-12-22 ENCOUNTER — Other Ambulatory Visit: Payer: Self-pay | Admitting: Family Medicine

## 2019-12-25 ENCOUNTER — Other Ambulatory Visit: Payer: Self-pay | Admitting: Family Medicine

## 2019-12-25 ENCOUNTER — Encounter: Payer: Self-pay | Admitting: Family Medicine

## 2019-12-27 ENCOUNTER — Other Ambulatory Visit: Payer: Self-pay | Admitting: Family Medicine

## 2019-12-27 ENCOUNTER — Encounter: Payer: Self-pay | Admitting: Family Medicine

## 2019-12-27 MED ORDER — ESCITALOPRAM OXALATE 10 MG PO TABS
10.0000 mg | ORAL_TABLET | Freq: Every day | ORAL | 2 refills | Status: DC
Start: 2019-12-27 — End: 2020-03-20

## 2019-12-27 MED FILL — ESCITALOPRAM 10 MG TABLET: 10 | 30 days supply | Qty: 30 | Fill #0

## 2019-12-28 MED FILL — LEVOTHYROXINE SODIUM 200 MC: 200 | 90 days supply | Qty: 90 | Fill #3

## 2019-12-28 MED FILL — CALCITRIOL 0.25 MCG CAPS: 0.25 | 90 days supply | Qty: 180 | Fill #0

## 2019-12-29 ENCOUNTER — Encounter: Payer: Self-pay | Admitting: Family Medicine

## 2019-12-30 NOTE — Telephone Encounter (Signed)
See my note

## 2019-12-30 NOTE — Telephone Encounter (Signed)
I do not have a form from [employer]. As for the [facility] form, we just filled this out 2 months ago. Why do we need to do this again so soon?

## 2019-12-31 ENCOUNTER — Telehealth: Payer: Self-pay | Admitting: Family Medicine

## 2019-12-31 NOTE — Telephone Encounter (Signed)
Pt is calling to check the status of the [employer] form and to see if we had received it and she is aware that we have and Dr. Sarajane Jews is not in the office and will be back in on Monday 01/03/2020.

## 2020-01-03 NOTE — Telephone Encounter (Signed)
Completed, in your red folder to be signed.

## 2020-01-05 NOTE — Telephone Encounter (Signed)
Form faxed

## 2020-01-05 NOTE — Telephone Encounter (Signed)
The form is ready  

## 2020-01-06 ENCOUNTER — Encounter: Payer: Self-pay | Admitting: Family Medicine

## 2020-01-09 MED FILL — ATORVASTATIN 80 MG TABLET: 80 | 90 days supply | Qty: 90 | Fill #2

## 2020-01-10 MED FILL — SPIRONOLACTONE 25 MG TABS: 25 | 90 days supply | Qty: 90 | Fill #3

## 2020-01-13 ENCOUNTER — Encounter: Payer: 59 | Admitting: Internal Medicine

## 2020-01-13 MED FILL — FLOVENT HFA 110 MCG INHALER: 110 | 30 days supply | Qty: 12 | Fill #4

## 2020-01-14 ENCOUNTER — Telehealth: Payer: Self-pay | Admitting: Family Medicine

## 2020-01-14 ENCOUNTER — Encounter: Payer: Self-pay | Admitting: Family Medicine

## 2020-01-14 NOTE — Telephone Encounter (Signed)
Pt call and stated she want to talk to sarah about her FMLA Paper work and requesting a call back.

## 2020-01-14 NOTE — Telephone Encounter (Signed)
See my chart encounter.

## 2020-01-18 ENCOUNTER — Other Ambulatory Visit: Payer: Self-pay | Admitting: Internal Medicine

## 2020-01-18 ENCOUNTER — Encounter: Payer: Self-pay | Admitting: Internal Medicine

## 2020-01-18 ENCOUNTER — Ambulatory Visit (INDEPENDENT_AMBULATORY_CARE_PROVIDER_SITE_OTHER): Payer: 59 | Admitting: Internal Medicine

## 2020-01-18 ENCOUNTER — Ambulatory Visit: Payer: 59 | Admitting: Internal Medicine

## 2020-01-18 ENCOUNTER — Other Ambulatory Visit: Payer: Self-pay

## 2020-01-18 VITALS — BP 188/76 | HR 58 | Temp 97.2°F | Ht 67.0 in | Wt 321.0 lb

## 2020-01-18 DIAGNOSIS — K219 Gastro-esophageal reflux disease without esophagitis: Secondary | ICD-10-CM

## 2020-01-18 DIAGNOSIS — J449 Chronic obstructive pulmonary disease, unspecified: Secondary | ICD-10-CM | POA: Diagnosis not present

## 2020-01-18 DIAGNOSIS — R0602 Shortness of breath: Secondary | ICD-10-CM

## 2020-01-18 DIAGNOSIS — J310 Chronic rhinitis: Secondary | ICD-10-CM | POA: Diagnosis not present

## 2020-01-18 LAB — PULMONARY FUNCTION TEST
DL/VA % pred: 104 %
DL/VA: 4.27 ml/min/mmHg/L
DLCO cor % pred: 99 %
DLCO cor: 22.14 ml/min/mmHg
DLCO unc % pred: 99 %
DLCO unc: 22.14 ml/min/mmHg
FEF 25-75 Post: 1.77 L/sec
FEF 25-75 Pre: 1.29 L/sec
FEF2575-%Change-Post: 36 %
FEF2575-%Pred-Post: 72 %
FEF2575-%Pred-Pre: 53 %
FEV1-%Change-Post: 8 %
FEV1-%Pred-Post: 82 %
FEV1-%Pred-Pre: 76 %
FEV1-Post: 2.31 L
FEV1-Pre: 2.14 L
FEV1FVC-%Change-Post: 4 %
FEV1FVC-%Pred-Pre: 89 %
FEV6-%Change-Post: 3 %
FEV6-%Pred-Post: 90 %
FEV6-%Pred-Pre: 87 %
FEV6-Post: 3.18 L
FEV6-Pre: 3.06 L
FEV6FVC-%Change-Post: 0 %
FEV6FVC-%Pred-Post: 103 %
FEV6FVC-%Pred-Pre: 103 %
FVC-%Change-Post: 3 %
FVC-%Pred-Post: 87 %
FVC-%Pred-Pre: 84 %
FVC-Post: 3.18 L
FVC-Pre: 3.08 L
Post FEV1/FVC ratio: 73 %
Post FEV6/FVC ratio: 100 %
Pre FEV1/FVC ratio: 69 %
Pre FEV6/FVC Ratio: 99 %
RV % pred: 140 %
RV: 3.05 L
TLC % pred: 112 %
TLC: 6.2 L

## 2020-01-18 LAB — POCT EXHALED NITRIC OXIDE: FeNO level (ppb): 12

## 2020-01-18 MED ORDER — FLUTICASONE PROPIONATE 50 MCG/ACT NA SUSP
1.0000 | Freq: Every day | NASAL | 5 refills | Status: DC
Start: 2020-01-18 — End: 2020-12-06

## 2020-01-18 MED ORDER — FLUTICASONE-SALMETEROL 250-50 MCG/DOSE IN AEPB
1.0000 | INHALATION_SPRAY | Freq: Two times a day (BID) | RESPIRATORY_TRACT | 5 refills | Status: DC
Start: 2020-01-18 — End: 2022-05-17

## 2020-01-18 MED FILL — ADVAIR 250/50 DISKUS: 250-50 | 30 days supply | Qty: 60 | Fill #0

## 2020-01-18 MED FILL — FLUTICASONE PROP 50 MCG SPR: 50 | 60 days supply | Qty: 16 | Fill #0

## 2020-01-18 NOTE — Progress Notes (Signed)
Nancy [name]    [id number]    July 03, 1955  Primary Care Physician:Fry, Ishmael Holter, MD Date of Appointment: 01/18/2020 Established Patient Visit  Chief complaint:   Chief Complaint  Patient presents with  . Follow-up    Dry cough/ wheeze/ SOB       HPI:  Nancy [name] is a 64 y.o. woman with asthma copd overlap syndrome.  Interval Updates: Here for follow up today after PFTs. Coughing after PFTs.  She does have red and watery eyes with dry eyes.  Taking nebs 3-4 times/day. Feels nebs help more than MDI.   Current Regimen: flovent, albuterol Asthma Triggers: exercise Exacerbations in the last year: none, last in October 2020 History of hospitalization or intubation: no Hives: yes, to sulfa drugs Allergy Testing: yes, previously received allergy shots - allergic to dogs, cats, mold.  GERD: yes, treated with PPI BID Allergic Rhinitis: none currently, but in childhood.  ACT:  Asthma Control Test ACT Total Score  01/18/2020 9  12/13/2019 12   FeNO: 12 ppb  I have reviewed the patient's family social and past medical history and updated as appropriate.   Past Medical History:  Diagnosis Date  . Allergic rhinitis    gets shots per Dr. Harold Hedge   . Anemia 2001  . Anginal pain ([place])   . COPD (chronic obstructive pulmonary disease) ([place])    "CXR just showed mild COPD" (10/24/2011)  . Coronary artery disease    had MI in 2001, see[facility] Dr. Fletcher [facility]on at [facility]  . GERD (gastroesophageal reflux disease)   . Gynecological examination    sees Dr. Cherylann Banas   . History of cardiac catheterization 07-24-07   showed nonconclusive disease  . Hyperlipidemia   . Hypertension   . Hypothyroidism   . Migraines    "have a history of migraines; haven't had one for years" (10/24/2011)  . Myocardial infarction [facility]) 2001?    Past Surgical History:  Procedure Laterality Date  . CARDIAC CATHETERIZATION  2009  . CESAREAN SECTION  1984  . COLONOSCOPY  10-06-08   per  Dr. Henrene Pastor, benign polyps, repeat in 10 yrs   . ESOPHAGOGASTRODUODENOSCOPY  05/29/2017   per Dr. Henrene Pastor, normal except slight gastritis   . HERNIA REPAIR  ~ 2007   ventral hernia repair  . THYROIDECTOMY  1990's  . TONSILLECTOMY     "when I was a kid"    Family History  Problem Relation Age of Onset  . Breast cancer Mother   . Pulmonary fibrosis Mother   . COPD Mother   . Coronary artery disease Father   . Hypertension Father   . Cancer Other        breast,colon,prostate  . Alcohol abuse Other   . Hyperlipidemia Other   . Hypertension Other   . Stroke Other   . Heart disease Other   . COPD Other   . Diabetes Other   . Stomach cancer Paternal Grandmother   . Colon cancer Paternal Grandfather     Social History   Occupational History  . Occupation: respiratory therapist  Tobacco Use  . Smoking status: Former Smoker    Packs/day: 1.00    Years: 30.00    Pack years: 30.00    Types: Cigarettes    Quit date: 10/24/2006    Years since quitting: 13.2  . Smokeless tobacco: Never Used  Vaping Use  . Vaping Use: Never used  Substance and Sexual Activity  . Alcohol use:  No    Alcohol/week: 0.0 standard drinks  . Drug use: No  . Sexual activity: Never     Physical Exam: Blood pressure (!) 188/76, pulse (!) 58, temperature (!) 97.2 F (36.2 C), height 5' 7"  (1.702 m), weight (!) 321 lb (145.6 kg), SpO2 97 %.  Gen:      No acute distress, frequent cough, frequent throat clearing ENT: watery red eyes, Lungs:    No increased respiratory effort, symmetric chest wall excursion, clear to auscultation bilaterally,  mild upper airway wheezing CV:         Regular rate and rhythm; no murmurs, rubs, or gallops.  No pedal edema   Data Reviewed: Imaging: I have personally reviewed the chest xray October 2021  PFTs:  PFT Results Latest Ref Rng & Units 01/18/2020  FVC-Pre L 3.08  FVC-Predicted Pre % 84  FVC-Post L 3.18  FVC-Predicted Post % 87  Pre FEV1/FVC % % 69  Post  FEV1/FCV % % 73  FEV1-Pre L 2.14  FEV1-Predicted Pre % 76  FEV1-Post L 2.31  DLCO uncorrected ml/min/mmHg 22.14  DLCO UNC% % 99  DLCO corrected ml/min/mmHg 22.14  DLCO COR %Predicted % 99  DLVA Predicted % 104  TLC L 6.20  TLC % Predicted % 112  RV % Predicted % 140   I have personally reviewed the patient's PFTs and they showed mild airflow limitation, no bronchodilator response.   Labs: Lab Results  Component Value Date   WBC 7.1 09/21/2019   HGB 15.4 09/21/2019   HCT 46.3 (H) 09/21/2019   MCV 89.4 09/21/2019   PLT 163 09/21/2019   Lab Results  Component Value Date   NA 142 09/21/2019   K 4.5 09/21/2019   CL 104 09/21/2019   CO2 30 09/21/2019     Immunization status: Immunization History  Administered Date(s) Administered  . Influenza, Seasonal, Injecte, Preservative Fre 10/11/2016  . Influenza,inj,Quad PF,6+ Mos 10/07/2017  . Influenza,inj,Quad PF,6-35 Mos 11/15/2019  . Influenza-Unspecified 10/28/2013, 11/22/2014, 10/11/2016  . PFIZER SARS-COV-2 Vaccination 09/29/2019, 10/20/2019  . Tdap 01/14/2012    Assessment:  Asthma COPD overlap syndrome History of tobacco use Possible OSA GERD  Plan/Recommendations: I do not think she is having an exacerbation at this time of her COPD. However her rhinitis seems not well controlled. Start flonase for rhinitis Continue BID PPI for reflux Switch from flovent to advair diskus. Inhaler teaching provider. Continue prn albuterol.   Return to Care: Return in about 3 months (around 04/17/2020).   Lenice Llamas, MD Pulmonary and [place]

## 2020-01-18 NOTE — Progress Notes (Signed)
PFT done today. 

## 2020-01-18 NOTE — Patient Instructions (Signed)
The patient should have follow up scheduled with myself in 3 months.   Start taking flonase daily. Switch from Flovent to Advair for your asthma  Flonase - 1 spray on each side of your nose twice a day for first week, then 1 spray on each side.   Instructions for use:  If you also use a saline nasal spray or rinse, use that first.  Position the head with the chin slightly tucked. Use the right hand to spray into the left nostril and the right hand to spray into the left nostril.   Point the bottle away from the septum of your nose (cartilage that divides the two sides of your nose).   Hold the nostril closed on the opposite side from where you will spray  Spray once and gently sniff to pull the medicine into the higher parts of your nose.  Don't sniff too hard as the medicine will drain down the back of your throat instead.  Repeat with a second spray on the same side if prescribed.  Repeat on the other side of your nose.  Instructions For Advair Diskus Take the ADVAIR DISKUS out of the box and foil overwrap pouch. Write the "Pouch opened" and "Use by" dates on the label on top of the DISKUS. The "Use by" date is 1 month from date of opening the pouch.     * The DISKUS will be in the closed position when the pouch is opened.     * The dose indicator on the top of the DISKUS tells you how many doses are left. The dose indicator number will decrease each time you use the DISKUS. After you have used 55 doses from the DISKUS, the numbers 5 to 0 will appear in red to warn you that there are only a few doses left. If you are using a "sample" DISKUS, the numbers 5 to 0 will appear in red after 23 doses.  Taking a dose from the DISKUS requires the following 3 simple steps: Open, Click, Inhale.  1. OPEN Hold the DISKUS in one hand and put the thumb of your other hand on the thumbgrip. Push your thumb away from you as far as it will go until the mouthpiece appears and snaps into position.  2.    CLICK Hold the DISKUS in a level, flat position with the mouthpiece toward you. Slide the lever away from you as far as it will go until it clicks. The DISKUS is now ready to use. Every time the lever is pushed back, a dose is ready to be inhaled. This is shown by a decrease in numbers on the dose counter. To avoid releasing or wasting doses once the DISKUS is ready: Do not close the DISKUS. Do not tilt the DISKUS. Do not play with the lever. Do not move the lever more than once.  3.   INHALE Before inhaling your dose from the DISKUS, breathe out (exhale) fully while holding the DISKUS level and away from your mouth. Remember, never breathe out into the DISKUS mouthpiece.  Put the mouthpiece to your lips. Breathe in quickly and deeply through the DISKUS. Do not breathe in through your nose.  Remove the DISKUS from your mouth. Hold your breath for about 10 seconds, or for as long as is comfortable. Breathe out slowly.  The DISKUS delivers your dose of medicine as a very fine powder. Most patients can taste or feel the powder. Do not use another dose from the [facility]  if you do not feel or taste the medicine.  Rinse your mouth with water after breathing in the medicine. Spit the water out. Do not swallow.  4.  CLOSE the DISKUS when you are finished taking a dose so that the DISKUS will be ready for you to take your next dose. Put your thumb on the thumbgrip and slide the thumbgrip back toward you as far as it will go. The DISKUS will click shut. The lever will automatically return to its original position. The DISKUS is now ready for you to take your next scheduled dose, due in about 12 hours. (Repeat the steps 1 to 4.)  REMEMBER: Never breathe into the DISKUS. Never take the DISKUS apart. Always ready and use the DISKUS in a level, flat position. Do not use the DISKUS with a spacer device. After each dose, rinse your mouth with water and spit the water out. Do not swallow. Never wash the  mouthpiece or any part of the DISKUS. Keep it dry. Always keep the DISKUS in a dry place. Never take an extra dose, even if you did not taste or feel the medicine.

## 2020-01-19 ENCOUNTER — Encounter (HOSPITAL_COMMUNITY): Payer: Self-pay | Admitting: *Deleted

## 2020-01-19 NOTE — Progress Notes (Signed)
Received fax from [place] to Dr Haroldine Laws requesting records from 05/29/2019 to present and an Attending Physician Statement Form  Pt has not been seen by Dr Haroldine Laws since March 2020.   Note faxed back to them at [phone] with note pt not seen since 2020 so records available and unable to complete form

## 2020-01-20 MED FILL — ESCITALOPRAM 10 MG TABLET: 10 | 30 days supply | Qty: 30 | Fill #1

## 2020-01-21 ENCOUNTER — Telehealth: Payer: 59 | Admitting: Nurse Practitioner

## 2020-01-21 DIAGNOSIS — J069 Acute upper respiratory infection, unspecified: Secondary | ICD-10-CM | POA: Diagnosis not present

## 2020-01-21 NOTE — Progress Notes (Signed)
We are sorry you are not feeling well.  Here is how we plan to help!  Based on what you have shared with me, it looks like you may have a viral upper respiratory infection.  Upper respiratory infections are caused by a large number of viruses; however, rhinovirus is the most common cause.   Symptoms vary from person to person, with common symptoms including sore throat, cough, fatigue or lack of energy and feeling of general discomfort.  A low-grade fever of up to 100.4 may present, but is often uncommon.  Symptoms vary however, and are closely related to a person's age or underlying illnesses.  The most common symptoms associated with an upper respiratory infection are nasal discharge or congestion, cough, sneezing, headache and pressure in the ears and face.  These symptoms usually persist for about 3 to 10 days, but can last up to 2 weeks.  It is important to know that upper respiratory infections do not cause serious illness or complications in most cases.    Upper respiratory infections can be transmitted from person to person, with the most common method of transmission being a person's hands.  The virus is able to live on the skin and can infect other persons for up to 2 hours after direct contact.  Also, these can be transmitted when someone coughs or sneezes; thus, it is important to cover the mouth to reduce this risk.  To keep the spread of the illness at [place], good hand hygiene is very important.  This is an infection that is most likely caused by a virus. There are no specific treatments other than to help you with the symptoms until the infection runs its course.  We are sorry you are not feeling well.  Here is how we plan to help!   For nasal congestion, you may use an oral decongestants such as Mucinex D or if you have glaucoma or high blood pressure use plain Mucinex.  Saline nasal spray or nasal drops can help and can safely be used as often as needed for congestion.  For your congestion,  I have prescribed Fluticasone nasal spray one spray in each nostril twice a day as previously prescribed  If you do not have a history of heart disease, hypertension, diabetes or thyroid disease, prostate/bladder issues or glaucoma, you may also use Sudafed to treat nasal congestion.  It is highly recommended that you consult with a pharmacist or your primary care physician to ensure this medication is safe for you to take.     If you have a cough, you may use cough suppressants such as Delsym and Robitussin.  If you have glaucoma or high blood pressure, you can also use Coricidin HBP.     If you have a sore or scratchy throat, use a saltwater gargle-  to  teaspoon of salt dissolved in a 4-ounce to 8-ounce glass of warm water.  Gargle the solution for approximately 15-30 seconds and then spit.  It is important not to swallow the solution.  You can also use throat lozenges/cough drops and Chloraseptic spray to help with throat pain or discomfort.  Warm or cold liquids can also be helpful in relieving throat pain.  For headache, pain or general discomfort, you can use Ibuprofen or Tylenol as directed.   Some authorities believe that zinc sprays or the use of Echinacea may shorten the course of your symptoms.   HOME CARE . Only take medications as instructed by your medical team. . Be  sure to drink plenty of fluids. Water is fine as well as fruit juices, sodas and electrolyte beverages. You may want to stay away from caffeine or alcohol. If you are nauseated, try taking small sips of liquids. How do you know if you are getting enough fluid? Your urine should be a pale yellow or almost colorless. . Get rest. . Taking a steamy shower or using a humidifier may help nasal congestion and ease sore throat pain. You can place a towel over your head and breathe in the steam from hot water coming from a faucet. . Using a saline nasal spray works much the same way. . Cough drops, hard candies and sore throat  lozenges may ease your cough. . Avoid close contacts especially the very young and the elderly . Cover your mouth if you cough or sneeze . Always remember to wash your hands.   GET HELP RIGHT AWAY IF: . You develop worsening fever. . If your symptoms do not improve within 10 days . You develop yellow or green discharge from your nose over 3 days. . You have coughing fits . You develop a severe head ache or visual changes. . You develop shortness of breath, difficulty breathing or start having chest pain . Your symptoms persist after you have completed your treatment plan  MAKE SURE YOU   Understand these instructions.  Will watch your condition.  Will get help right away if you are not doing well or get worse.  Your e-visit answers were reviewed by a board certified advanced clinical practitioner to complete your personal care plan. Depending upon the condition, your plan could have included both over the counter or prescription medications. Please review your pharmacy choice. If there is a problem, you may call our nursing hot line at and have the prescription routed to another pharmacy. Your safety is important to [place]. If you have drug allergies check your prescription carefully.   You can use MyChart to ask questions about today's visit, request a non-urgent call back, or ask for a work or school excuse for 24 hours related to this e-Visit. If it has been greater than 24 hours you will need to follow up with your provider, or enter a new e-Visit to address those concerns. You will get an e-mail in the next two days asking about your experience.  I hope that your e-visit has been valuable and will speed your recovery. Thank you for using e-visits.  5-10 minutes spent reviewing and documenting in chart.

## 2020-01-31 ENCOUNTER — Other Ambulatory Visit: Payer: Self-pay | Admitting: Family Medicine

## 2020-01-31 MED FILL — METOPROLOL TARTRATE 25 MG T: 25 | 90 days supply | Qty: 180 | Fill #0

## 2020-02-02 ENCOUNTER — Other Ambulatory Visit: Payer: Self-pay | Admitting: Family Medicine

## 2020-02-04 ENCOUNTER — Other Ambulatory Visit: Payer: Self-pay | Admitting: Family Medicine

## 2020-02-07 ENCOUNTER — Other Ambulatory Visit: Payer: Self-pay | Admitting: Family Medicine

## 2020-02-11 MED FILL — ADVAIR 250/50 DISKUS: 250-50 | 30 days supply | Qty: 60 | Fill #1

## 2020-02-12 MED FILL — FLOVENT HFA 110 MCG INHALER: 110 | 30 days supply | Qty: 12 | Fill #5

## 2020-02-13 MED FILL — FUROSEMIDE 20 MG TABS: 20 | 90 days supply | Qty: 90 | Fill #3

## 2020-02-15 ENCOUNTER — Other Ambulatory Visit: Payer: Self-pay | Admitting: Family Medicine

## 2020-02-15 DIAGNOSIS — Z1231 Encounter for screening mammogram for malignant neoplasm of breast: Secondary | ICD-10-CM

## 2020-02-16 ENCOUNTER — Other Ambulatory Visit: Payer: Self-pay

## 2020-02-16 ENCOUNTER — Encounter: Payer: Self-pay | Admitting: Family Medicine

## 2020-02-16 ENCOUNTER — Telehealth (INDEPENDENT_AMBULATORY_CARE_PROVIDER_SITE_OTHER): Payer: 59 | Admitting: Family Medicine

## 2020-02-16 ENCOUNTER — Other Ambulatory Visit: Payer: Self-pay | Admitting: Internal Medicine

## 2020-02-16 ENCOUNTER — Ambulatory Visit (AMBULATORY_SURGERY_CENTER): Payer: 59 | Admitting: *Deleted

## 2020-02-16 VITALS — Ht 67.0 in | Wt 315.0 lb

## 2020-02-16 DIAGNOSIS — F1190 Opioid use, unspecified, uncomplicated: Secondary | ICD-10-CM

## 2020-02-16 DIAGNOSIS — Z1211 Encounter for screening for malignant neoplasm of colon: Secondary | ICD-10-CM

## 2020-02-16 DIAGNOSIS — M545 Low back pain, unspecified: Secondary | ICD-10-CM

## 2020-02-16 DIAGNOSIS — M79604 Pain in right leg: Secondary | ICD-10-CM | POA: Diagnosis not present

## 2020-02-16 MED ORDER — HYDROCODONE-ACETAMINOPHEN 5-325 MG PO TABS
1.0000 | ORAL_TABLET | Freq: Two times a day (BID) | ORAL | 0 refills | Status: DC | PRN
Start: 2020-04-15 — End: 2020-04-19

## 2020-02-16 MED ORDER — NA SULFATE-K SULFATE-MG SULF 17.5-3.13-1.6 GM/177ML PO SOLN: ORAL | 0 refills | Status: DC

## 2020-02-16 MED ORDER — HYDROCODONE-ACETAMINOPHEN 5-325 MG PO TABS
1.0000 | ORAL_TABLET | Freq: Two times a day (BID) | ORAL | 0 refills | Status: DC | PRN
Start: 2020-02-16 — End: 2020-02-16

## 2020-02-16 MED ORDER — HYDROCODONE-ACETAMINOPHEN 5-325 MG PO TABS
1.0000 | ORAL_TABLET | Freq: Two times a day (BID) | ORAL | 0 refills | Status: DC | PRN
Start: 2020-03-18 — End: 2020-02-16

## 2020-02-16 MED FILL — HYDROCODON-APAP 5-325: 5-325 | 30 days supply | Qty: 60 | Fill #0

## 2020-02-16 MED FILL — SUPREP BOWEL PREP KIT: 17.5-3.13-1 | 1 days supply | Qty: 354 | Fill #0

## 2020-02-16 NOTE — Progress Notes (Signed)
   Subjective:    Patient ID: Nancy [name], female    DOB: 08-16-1955, 65 y.o.   MRN: [id number]  HPI Virtual Visit via Telephone Note  I connected with the patient on 02/16/20 at  3:15 PM EST by telephone and verified that I am speaking with the correct person using two identifiers.   I discussed the limitations, risks, security and privacy concerns of performing an evaluation and management service by telephone and the availability of in person appointments. I also discussed with the patient that there may be a patient responsible charge related to this service. The patient expressed understanding and agreed to proceed.  Location patient: home Location provider: work or home office Participants present for the call: patient, provider Patient did not have a visit in the prior 7 days to address this/these issue(s).   History of Present Illness: Here for pain management, she is doing well.  Indication for chronic opioid: low back pain Medication and dose: Norco 5-325 # pills per month: 60 Last UDS date: 01-21-19 Opioid Treatment Agreement signed (Y/N): 01-21-19 Opioid Treatment Agreement last reviewed with patient:  02-16-20 NCCSRS reviewed this encounter (include red flags): Yes    Observations/Objective: Patient sounds cheerful and well on the phone. I do not appreciate any SOB. Speech and thought processing are grossly intact. Patient reported vitals:  Assessment and Plan: Pain management, meds were refilled.  Alysia Penna, MD   Follow Up Instructions:     [id number] 5-10 [phone] 11-20 [id number] 21-30 I did not refer this patient for an OV in the next 24 hours for this/these issue(s).  I discussed the assessment and treatment plan with the patient. The patient was provided an opportunity to ask questions and all were answered. The patient agreed with the plan and demonstrated an understanding of the instructions.   The patient was advised to call back or seek an in-person  evaluation if the symptoms worsen or if the condition fails to improve as anticipated.  I provided 12 minutes of non-face-to-face time during this encounter.   Alysia Penna, MD    Review of Systems     Objective:   Physical Exam        Assessment & Plan:

## 2020-02-16 NOTE — Progress Notes (Signed)
Patient denies any allergies to eggs or soy. Patient denies any problems with anesthesia/sedation. Pt denies constipation. Patient denies any oxygen use at home. Patient denies taking any diet/weight loss medications or blood thinners. Patient is not being treated for MRSA or C-diff. Patient is aware of our care-partner policy and ZOXWR-60 safety protocol. EMMI education assigned to the patient for the procedure, sent to [place].  Patient's pre-visit was done today over the phone with the patient due to COVID-19 pandemic. Name,DOB and address verified. Insurance verified. Packet of Prep instructions mailed to patient including a copy of a consent form and pre-procedure patient acknowledgement form (with envelope to mail back to us)-pt is aware.  Patient understands to call us back with any questions or concerns. COVID-19 vaccines completed on 10/20/19, per patient. Notified patient of our Covid-19 safety protocols.

## 2020-02-17 ENCOUNTER — Other Ambulatory Visit: Payer: 59

## 2020-02-17 ENCOUNTER — Other Ambulatory Visit: Payer: Self-pay | Admitting: Internal Medicine

## 2020-02-17 DIAGNOSIS — F1190 Opioid use, unspecified, uncomplicated: Secondary | ICD-10-CM | POA: Diagnosis not present

## 2020-02-17 DIAGNOSIS — Z1211 Encounter for screening for malignant neoplasm of colon: Secondary | ICD-10-CM

## 2020-02-18 ENCOUNTER — Other Ambulatory Visit: Payer: Self-pay | Admitting: Internal Medicine

## 2020-02-18 MED FILL — MAGNESIUM OXIDE 400 MG TAB: 400 (240 MG | 90 days supply | Qty: 90 | Fill #1

## 2020-02-19 LAB — DRUG MONITOR, PANEL 1, W/CONF, URINE
Alphahydroxyalprazolam: NEGATIVE ng/mL (ref <25)
Alphahydroxymidazolam: NEGATIVE ng/mL (ref <50)
Alphahydroxytriazolam: NEGATIVE ng/mL (ref <50)
Aminoclonazepam: NEGATIVE ng/mL (ref <25)
Amphetamines: NEGATIVE ng/mL (ref <500)
Barbiturates: NEGATIVE ng/mL (ref <300)
Benzodiazepines: POSITIVE ng/mL — AB (ref <100)
Cocaine Metabolite: NEGATIVE ng/mL (ref <150)
Creatinine: >300 mg/dL
Hydroxyethylflurazepam: NEGATIVE ng/mL (ref <50)
Lorazepam: NEGATIVE ng/mL (ref <50)
Marijuana Metabolite: NEGATIVE ng/mL (ref <20)
Methadone Metabolite: NEGATIVE ng/mL (ref <100)
Nordiazepam: NEGATIVE ng/mL (ref <50)
Opiates: NEGATIVE ng/mL (ref <100)
Oxazepam: 1235 ng/mL — ABNORMAL HIGH (ref <50)
Oxidant: NEGATIVE ug/mL
Oxycodone: NEGATIVE ng/mL (ref <100)
Phencyclidine: NEGATIVE ng/mL (ref <25)
Temazepam: >8000 ng/mL — ABNORMAL HIGH (ref <50)
pH: 6.7 (ref 4.5–9.0)

## 2020-02-19 LAB — DM TEMPLATE

## 2020-02-19 MED FILL — ESCITALOPRAM 10 MG TABLET: 10 | 30 days supply | Qty: 30 | Fill #2

## 2020-02-19 MED FILL — SUPREP BOWEL PREP KIT: 17.5-3.13-1 | 1 days supply | Qty: 354 | Fill #0

## 2020-02-23 ENCOUNTER — Encounter: Payer: Self-pay | Admitting: Internal Medicine

## 2020-03-01 ENCOUNTER — Other Ambulatory Visit: Payer: Self-pay

## 2020-03-01 ENCOUNTER — Encounter: Payer: Self-pay | Admitting: Internal Medicine

## 2020-03-01 ENCOUNTER — Ambulatory Visit (AMBULATORY_SURGERY_CENTER): Payer: 59 | Admitting: Internal Medicine

## 2020-03-01 VITALS — BP 119/51 | HR 54 | Temp 97.5°F | Resp 15 | Ht 67.0 in | Wt 315.0 lb

## 2020-03-01 DIAGNOSIS — Z1211 Encounter for screening for malignant neoplasm of colon: Secondary | ICD-10-CM | POA: Diagnosis not present

## 2020-03-01 DIAGNOSIS — D120 Benign neoplasm of cecum: Secondary | ICD-10-CM | POA: Diagnosis not present

## 2020-03-01 DIAGNOSIS — D125 Benign neoplasm of sigmoid colon: Secondary | ICD-10-CM

## 2020-03-01 DIAGNOSIS — K635 Polyp of colon: Secondary | ICD-10-CM

## 2020-03-01 DIAGNOSIS — D122 Benign neoplasm of ascending colon: Secondary | ICD-10-CM

## 2020-03-01 DIAGNOSIS — D124 Benign neoplasm of descending colon: Secondary | ICD-10-CM

## 2020-03-01 HISTORY — PX: COLONOSCOPY: SHX174

## 2020-03-01 MED ORDER — SODIUM CHLORIDE 0.9 % IV SOLN: 500.0000 mL | Freq: Once | INTRAVENOUS | Status: DC

## 2020-03-01 NOTE — Progress Notes (Signed)
Pt Drowsy. VSS. To PACU, report to RN. No anesthetic complications noted.  

## 2020-03-01 NOTE — Patient Instructions (Signed)
YOU HAD AN ENDOSCOPIC PROCEDURE TODAY AT THE [facility]:   Refer to the procedure report that was given to you for any specific questions about what was found during the examination.  If the procedure report does not answer your questions, please call your gastroenterologist to clarify.  If you requested that your care partner not be given the details of your procedure findings, then the procedure report has been included in a sealed envelope for you to review at your convenience later.  YOU SHOULD EXPECT: Some feelings of bloating in the abdomen. Passage of more gas than usual.  Walking can help get rid of the air that was put into your GI tract during the procedure and reduce the bloating. If you had a lower endoscopy (such as a colonoscopy or flexible sigmoidoscopy) you may notice spotting of blood in your stool or on the toilet paper. If you underwent a bowel prep for your procedure, you may not have a normal bowel movement for a few days.  Please Note:  You might notice some irritation and congestion in your nose or some drainage.  This is from the oxygen used during your procedure.  There is no need for concern and it should clear up in a day or so.  SYMPTOMS TO REPORT IMMEDIATELY:   Following lower endoscopy (colonoscopy or flexible sigmoidoscopy):  Excessive amounts of blood in the stool  Significant tenderness or worsening of abdominal pains  Swelling of the abdomen that is new, acute  Fever of 100F or higher  For urgent or emergent issues, a gastroenterologist can be reached at any hour by calling [phone]. Do not use MyChart messaging for urgent concerns.    DIET:  We do recommend a small meal at first, but then you may proceed to your regular diet.  Drink plenty of fluids but you should avoid alcoholic beverages for 24 hours.  ACTIVITY:  You should plan to take it easy for the rest of today and you should NOT DRIVE or use heavy machinery until tomorrow (because  of the sedation medicines used during the test).    FOLLOW UP: Our staff will call the number listed on your records 48-72 hours following your procedure to check on you and address any questions or concerns that you may have regarding the information given to you following your procedure. If we do not reach you, we will leave a message.  We will attempt to reach you two times.  During this call, we will ask if you have developed any symptoms of COVID 19. If you develop any symptoms (ie: fever, flu-like symptoms, shortness of breath, cough etc.) before then, please call [phone].  If you test positive for Covid 19 in the 2 weeks post procedure, please call and report this information to us.    If any biopsies were taken you will be contacted by phone or by letter within the next 1-3 weeks.  Please call us at [phone] if you have not heard about the biopsies in 3 weeks.    SIGNATURES/CONFIDENTIALITY: You and/or your care partner have signed paperwork which will be entered into your electronic medical record.  These signatures attest to the fact that that the information above on your After Visit Summary has been reviewed and is understood.  Full responsibility of the confidentiality of this discharge information lies with you and/or your care-partner. 

## 2020-03-01 NOTE — Progress Notes (Signed)
VS-SM  Pt's states no medical or surgical changes since previsit or office visit.  

## 2020-03-01 NOTE — Progress Notes (Signed)
Called to room to assist during endoscopic procedure.  Patient ID and intended procedure confirmed with present staff. Received instructions for my participation in the procedure from the performing physician.  

## 2020-03-01 NOTE — Op Note (Signed)
[place] Patient Name: Nancy [name] Procedure Date: 03/01/2020 9:32 AM MRN: [id number] Endoscopist: Docia Chuck. Henrene Pastor , MD Age: 65 Referring MD:  Date of Birth: [birth year] Gender: Female Account #: [id number] Procedure:                Colonoscopy with cold snare polypectomy x 9 Indications:              Screening for colorectal malignant neoplasm.                            Negative index exam 2010 Medicines:                Monitored Anesthesia Care Procedure:                Pre-Anesthesia Assessment:                           - Prior to the procedure, a History and Physical                            was performed, and patient medications and                            allergies were reviewed. The patient's tolerance of                            previous anesthesia was also reviewed. The risks                            and benefits of the procedure and the sedation                            options and risks were discussed with the patient.                            All questions were answered, and informed consent                            was obtained. Prior Anticoagulants: The patient has                            taken no previous anticoagulant or antiplatelet                            agents. ASA Grade Assessment: II - A patient with                            mild systemic disease. After reviewing the risks                            and benefits, the patient was deemed in                            satisfactory condition to undergo the procedure.  After obtaining informed consent, the colonoscope                            was passed under direct vision. Throughout the                            procedure, the patient's blood pressure, pulse, and                            oxygen saturations were monitored continuously. The                            Olympus CF-HQ190 [id number]) Colonoscope was                            introduced  through the anus and advanced to the the                            cecum, identified by appendiceal orifice and                            ileocecal valve. The ileocecal valve, appendiceal                            orifice, and rectum were photographed. The quality                            of the bowel preparation was good. The colonoscopy                            was performed without difficulty. The patient                            tolerated the procedure well. The bowel preparation                            used was SUPREP via split dose instruction. Scope In: 9:43:28 AM Scope Out: 16:60:60 AM Scope Withdrawal Time: 0 hours 18 minutes 21 seconds  Total Procedure Duration: 0 hours 20 minutes 34 seconds  Findings:                 Nine polyps were found in the sigmoid colon,                            descending colon, ascending colon and cecum. The                            polyps were 1 to 6 mm in size. These polyps were                            removed with a cold snare. Resection and retrieval                            were  complete.                           Multiple diverticula were found in the entire colon.                           The exam was otherwise without abnormality on                            direct and retroflexion views. Complications:            No immediate complications. Estimated blood loss:                            None. Estimated Blood Loss:     Estimated blood loss: none. Impression:               - Nine 1 to 6 mm polyps in the sigmoid colon, in                            the descending colon, in the ascending colon and in                            the cecum, removed with a cold snare. Resected and                            retrieved.                           - Diverticulosis in the entire examined colon.                           - The examination was otherwise normal on direct                            and retroflexion  views. Recommendation:           - Repeat colonoscopy in 3 - 5 years for                            surveillance.                           - Patient has a contact number available for                            emergencies. The signs and symptoms of potential                            delayed complications were discussed with the                            patient. Return to normal activities tomorrow.                            Written discharge instructions were provided to the  patient.                           - Resume previous diet.                           - Continue present medications.                           - Await pathology results. Docia Chuck. Henrene Pastor, MD 03/01/2020 10:17:48 AM This report has been signed electronically.

## 2020-03-03 ENCOUNTER — Telehealth: Payer: Self-pay

## 2020-03-03 NOTE — Telephone Encounter (Signed)
  Follow up Call-  Call back number 03/01/2020  Post procedure Call Back phone  # [phone]  Permission to leave phone message Yes  Some recent data might be hidden     Patient questions:  Do you have a fever, pain , or abdominal swelling? No. Pain Score  0 *  Have you tolerated food without any problems? Yes.    Have you been able to return to your normal activities? Yes.    Do you have any questions about your discharge instructions: Diet   No. Medications  No. Follow up visit  No.  Do you have questions or concerns about your Care? No.  Actions: * If pain score is 4 or above: No action needed, pain <4.  1. Have you developed a fever since your procedure? no  2.   Have you had an respiratory symptoms (SOB or cough) since your procedure? no  3.   Have you tested positive for COVID 19 since your procedure no  4.   Have you had any family members/close contacts diagnosed with the COVID 19 since your procedure?  no   If yes to any of these questions please route to Joylene John, RN and Joella Prince, RN

## 2020-03-06 ENCOUNTER — Encounter: Payer: Self-pay | Admitting: Family Medicine

## 2020-03-07 ENCOUNTER — Other Ambulatory Visit: Payer: Self-pay | Admitting: Family Medicine

## 2020-03-07 MED FILL — TEMAZEPAM 15 MG CAPSULE: 15 | 90 days supply | Qty: 180 | Fill #0

## 2020-03-07 NOTE — Telephone Encounter (Signed)
Done

## 2020-03-09 ENCOUNTER — Encounter: Payer: Self-pay | Admitting: Internal Medicine

## 2020-03-12 MED FILL — ADVAIR 250/50 DISKUS: 250-50 | 30 days supply | Qty: 60 | Fill #2

## 2020-03-12 MED FILL — FLUTICASONE PROP 50 MCG SPR: 50 | 60 days supply | Qty: 16 | Fill #1

## 2020-03-13 ENCOUNTER — Other Ambulatory Visit: Payer: Self-pay | Admitting: Family Medicine

## 2020-03-14 ENCOUNTER — Other Ambulatory Visit: Payer: Self-pay | Admitting: Family Medicine

## 2020-03-14 MED FILL — OMEPRAZOLE 40 MG CPDR: 40 | 90 days supply | Qty: 180 | Fill #0

## 2020-03-15 ENCOUNTER — Other Ambulatory Visit: Payer: Self-pay | Admitting: Family Medicine

## 2020-03-18 MED FILL — HYDROCODON-APAP 5-325: 5-325 | 30 days supply | Qty: 60 | Fill #0

## 2020-03-20 ENCOUNTER — Other Ambulatory Visit: Payer: Self-pay | Admitting: Family Medicine

## 2020-03-20 MED FILL — ESCITALOPRAM 10 MG TABLET: 10 | 30 days supply | Qty: 30 | Fill #0

## 2020-03-21 MED FILL — CALCITRIOL 0.25 MCG CAPS: 0.25 | 90 days supply | Qty: 180 | Fill #1

## 2020-03-27 ENCOUNTER — Other Ambulatory Visit: Payer: Self-pay | Admitting: Family Medicine

## 2020-03-27 MED FILL — LEVOTHYROXINE SODIUM 200 MC: 200 | 90 days supply | Qty: 90 | Fill #0

## 2020-03-28 ENCOUNTER — Telehealth: Payer: Self-pay | Admitting: Internal Medicine

## 2020-03-28 NOTE — Telephone Encounter (Signed)
Spoke to patient, who stated that she recently switched insurance and Dr. Shearon Stalls is not in network with her insurance.  Dr. Loanne Drilling is in network per [place]. Patient would like to switch to Dr. Loanne Drilling. Advised patient of office protocol.    Dr. Shearon Stalls and Dr. Loanne Drilling, please advise. thanks

## 2020-03-29 NOTE — Telephone Encounter (Signed)
Spoke with [place] Latino who contacted Nancy [name] in credentialing, Dr. Shearon Stalls is in network. Called and spoke to patient advised her that we will be to file in network benefits if she continues to see Dr. Shearon Stalls. Patient okay with this and r/s patient with Dr. Shearon Stalls on 03/16. -pr

## 2020-03-29 NOTE — Telephone Encounter (Signed)
We are all in the same practice and therefore all in the same network.  Please hold on making any switches on patient care until we can clear this up.

## 2020-03-29 NOTE — Telephone Encounter (Signed)
Nancy [name],  The patient above reports that Dr. Shearon Stalls is out of network with [employer]. Can we look into this and have it corrected?

## 2020-04-05 ENCOUNTER — Telehealth: Payer: Self-pay | Admitting: Internal Medicine

## 2020-04-05 NOTE — Telephone Encounter (Signed)
Called and spoke to patient - advised that per our director Etta Grandchild Dr. Shearon Stalls is in network with [facility] at all locations - he received confirmation of this from our credentialing department - specifically Orlene Och. Patient is understands and will come for her appt. -pr

## 2020-04-05 NOTE — Telephone Encounter (Signed)
Pt is calling in regards to Dr. Shearon Stalls not being in network states that she spoke with [place] today and they said that she is in-network at the [employer] and [employer] but not [place] and she is wanting some clarification before she comes and gets a Engineer, technical sales. Pls regard [phone]

## 2020-04-08 MED FILL — ATORVASTATIN 80 MG TABLET: 80 | 30 days supply | Qty: 30 | Fill #3

## 2020-04-10 ENCOUNTER — Other Ambulatory Visit: Payer: Self-pay | Admitting: Family Medicine

## 2020-04-12 ENCOUNTER — Ambulatory Visit: Payer: 59 | Admitting: Internal Medicine

## 2020-04-12 ENCOUNTER — Encounter: Payer: Self-pay | Admitting: Internal Medicine

## 2020-04-12 ENCOUNTER — Other Ambulatory Visit: Payer: Self-pay

## 2020-04-12 ENCOUNTER — Ambulatory Visit (INDEPENDENT_AMBULATORY_CARE_PROVIDER_SITE_OTHER): Payer: Managed Care, Other (non HMO) | Admitting: Internal Medicine

## 2020-04-12 VITALS — BP 118/70 | HR 66 | Temp 98.4°F | Ht 67.0 in | Wt 323.6 lb

## 2020-04-12 DIAGNOSIS — J449 Chronic obstructive pulmonary disease, unspecified: Secondary | ICD-10-CM | POA: Diagnosis not present

## 2020-04-12 DIAGNOSIS — J309 Allergic rhinitis, unspecified: Secondary | ICD-10-CM

## 2020-04-12 MED ORDER — MONTELUKAST SODIUM 10 MG PO TABS: 10.0000 mg | ORAL_TABLET | Freq: Every day | ORAL | 11 refills | Status: DC

## 2020-04-12 MED ORDER — BREO ELLIPTA 200-25 MCG/INH IN AEPB: 1.0000 | INHALATION_SPRAY | Freq: Every day | RESPIRATORY_TRACT | 3 refills | Status: DC

## 2020-04-12 NOTE — Patient Instructions (Addendum)
The patient should have follow up scheduled with myself in 3 months.    Stop Advair, switch to Breo 1 puff once a day.  Start gargling with an alcohol based mouthwash. (equate, listerine check the ingredients on the back)  Start taking singulair once a day Continue zyrtec.  Take a break from the flonase and see if it is causing your headaches.

## 2020-04-12 NOTE — Progress Notes (Signed)
Nancy [name]    [id number]    [birth year]  Primary Care Physician:Fry, Ishmael Holter, MD Date of Appointment: 04/12/2020 Established Patient Visit  Chief complaint:   Chief Complaint  Patient presents with  . Follow-up    SOB/ cough increased     HPI:  Nancy [name] is a 65 y.o. woman with asthma copd overlap syndrome.  Interval Updates: She is here with worsening voice hoarseness since starting advair. She is gargling after use. Having headaches as well. Still on flonase. She thinks flonase is causing her headaches.  Using albuterol nebulizer and inhalers   Current Regimen: advair, albuterol nebulizer Asthma Triggers: exercise Exacerbations in the last year: none, last in October 2020 History of hospitalization or intubation: no Hives: yes, to sulfa drugs Allergy Testing: yes, previously received allergy shots - allergic to dogs, cats, mold.  GERD: yes, treated with PPI BID Allergic Rhinitis: yes, on flonase.  ACT:  Asthma Control Test ACT Total Score  04/12/2020 12  01/18/2020 9  12/13/2019 12   FeNO: 12 ppb  I have reviewed the patient's family social and past medical history and updated as appropriate.   Past Medical History:  Diagnosis Date  . Allergic rhinitis    gets shots per Dr. Harold Hedge   . Anemia 2001  . Anginal pain ([place])   . Asthma   . COPD (chronic obstructive pulmonary disease) ([place])    "CXR just showed mild COPD" (10/24/2011)  . Coronary artery disease    had MI in 2001, seees Dr. Fletcher [facility] at [facility]  . GERD (gastroesophageal reflux disease)   . Gynecological examination    sees Dr. Cherylann Banas   . History of cardiac catheterization 07-24-07   showed nonconclusive disease  . Hyperlipidemia   . Hypertension   . Hypothyroidism   . Migraines    "have a history of migraines; haven't had one for years" (10/24/2011)  . Myocardial infarction [facility]) 2001?   in her 90's    Past Surgical History:  Procedure Laterality Date  .  CARDIAC CATHETERIZATION  2009  . CESAREAN SECTION  1984  . COLONOSCOPY  10-06-08   per Dr. Henrene Pastor, benign polyps, repeat in 10 yrs   . ESOPHAGOGASTRODUODENOSCOPY  05/29/2017   per Dr. Henrene Pastor, normal except slight gastritis   . HERNIA REPAIR  ~ 2007   ventral hernia repair  . THYROIDECTOMY  1990's  . TONSILLECTOMY     "when I was a kid"    Family History  Problem Relation Age of Onset  . Breast cancer Mother   . Pulmonary fibrosis Mother   . COPD Mother   . Coronary artery disease Father   . Hypertension Father   . Cancer Other        breast,colon,prostate  . Alcohol abuse Other   . Hyperlipidemia Other   . Hypertension Other   . Stroke Other   . Heart disease Other   . COPD Other   . Diabetes Other   . Colon cancer Paternal Grandfather   . Stomach cancer Other   . Esophageal cancer Neg Hx   . Rectal cancer Neg Hx     Social History   Occupational History  . Occupation: respiratory therapist  Tobacco Use  . Smoking status: Former Smoker    Packs/day: 1.00    Years: 30.00    Pack years: 30.00    Types: Cigarettes    Quit date: 10/24/2006    Years since quitting: 13.4  .  Smokeless tobacco: Never Used  Vaping Use  . Vaping Use: Never used  Substance and Sexual Activity  . Alcohol use: No    Alcohol/week: 0.0 standard drinks  . Drug use: No  . Sexual activity: Never     Physical Exam: Blood pressure 118/70, pulse 66, temperature 98.4 F (36.9 C), temperature source Temporal, height 5' 7"  (1.702 m), weight (!) 323 lb 9.6 oz (146.8 kg), SpO2 93 %.  Gen:      No acute distress, hoarse voice ENT:  Conjunctival injection, watery eyes Lungs:    No increased respiratory effort, no wheezes or crackles CV:        RRR, no mrg   Data Reviewed: Imaging: I have personally reviewed the chest xray October 2021  PFTs:  PFT Results Latest Ref Rng & Units 01/18/2020  FVC-Pre L 3.08  FVC-Predicted Pre % 84  FVC-Post L 3.18  FVC-Predicted Post % 87  Pre FEV1/FVC % %  69  Post FEV1/FCV % % 73  FEV1-Pre L 2.14  FEV1-Predicted Pre % 76  FEV1-Post L 2.31  DLCO uncorrected ml/min/mmHg 22.14  DLCO UNC% % 99  DLCO corrected ml/min/mmHg 22.14  DLCO COR %Predicted % 99  DLVA Predicted % 104  TLC L 6.20  TLC % Predicted % 112  RV % Predicted % 140   I have personally reviewed the patient's PFTs and they showed mild airflow limitation, no bronchodilator response.   Labs: Lab Results  Component Value Date   WBC 7.1 09/21/2019   HGB 15.4 09/21/2019   HCT 46.3 (H) 09/21/2019   MCV 89.4 09/21/2019   PLT 163 09/21/2019   Lab Results  Component Value Date   NA 142 09/21/2019   K 4.5 09/21/2019   CL 104 09/21/2019   CO2 30 09/21/2019     Immunization status: Immunization History  Administered Date(s) Administered  . Influenza, Seasonal, Injecte, Preservative Fre 10/11/2016  . Influenza,inj,Quad PF,6+ Mos 10/07/2017  . Influenza,inj,Quad PF,6-35 Mos 11/15/2019  . Influenza-Unspecified 10/28/2013, 11/22/2014, 10/11/2016  . PFIZER(Purple Top)SARS-COV-2 Vaccination 09/29/2019, 10/20/2019  . Tdap 01/14/2012    Assessment:  Asthma COPD overlap syndrome, not well controlled Allergic rhinitis, not well controlled History of tobacco use Possible OSA GERD, controlled  Plan/Recommendations: Stop flonase, if this is causing headaches. Start singulair and continue zyrtect otc.  Continue BID PPI for reflux Switch to Breo. Recommend gargling with alcohol based mouthwash. Continue prn albuterol.   Return to Care: Return in about 3 months (around 07/13/2020).   Lenice Llamas, MD Pulmonary and [place]

## 2020-04-18 ENCOUNTER — Other Ambulatory Visit (HOSPITAL_BASED_OUTPATIENT_CLINIC_OR_DEPARTMENT_OTHER): Payer: Self-pay

## 2020-04-18 ENCOUNTER — Encounter: Payer: Self-pay | Admitting: Family Medicine

## 2020-04-19 ENCOUNTER — Other Ambulatory Visit: Payer: Self-pay

## 2020-04-19 MED ORDER — FUROSEMIDE 20 MG PO TABS: 20.0000 mg | ORAL_TABLET | Freq: Every day | ORAL | 3 refills | Status: DC

## 2020-04-19 MED ORDER — HYDROCODONE-ACETAMINOPHEN 5-325 MG PO TABS
1.0000 | ORAL_TABLET | Freq: Two times a day (BID) | ORAL | 0 refills | Status: DC | PRN
Start: 2020-04-19 — End: 2020-05-04

## 2020-04-19 MED ORDER — METOPROLOL TARTRATE 25 MG PO TABS: 25.0000 mg | ORAL_TABLET | Freq: Two times a day (BID) | ORAL | 3 refills | Status: DC

## 2020-04-19 MED ORDER — OMEPRAZOLE 40 MG PO CPDR: 40.0000 mg | DELAYED_RELEASE_CAPSULE | Freq: Two times a day (BID) | ORAL | 0 refills | Status: DC

## 2020-04-19 MED ORDER — LEVOTHYROXINE SODIUM 200 MCG PO TABS: 200.0000 ug | ORAL_TABLET | Freq: Every day | ORAL | 3 refills | Status: DC

## 2020-04-19 MED ORDER — LEVOTHYROXINE SODIUM 200 MCG PO TABS: 200.0000 ug | ORAL_TABLET | Freq: Every day | ORAL | 1 refills | Status: DC

## 2020-04-19 MED ORDER — METOPROLOL TARTRATE 25 MG PO TABS: 25.0000 mg | ORAL_TABLET | Freq: Two times a day (BID) | ORAL | 0 refills | Status: DC

## 2020-04-19 MED ORDER — OMEPRAZOLE 40 MG PO CPDR: 40.0000 mg | DELAYED_RELEASE_CAPSULE | Freq: Two times a day (BID) | ORAL | 3 refills | Status: DC

## 2020-04-19 NOTE — Telephone Encounter (Signed)
I sent one month supply of Norco to [facility]. After that she will be due for another PMV

## 2020-04-19 NOTE — Telephone Encounter (Signed)
I sent the first 4 to [facility], but where is the Hydrocodone supposed to go?

## 2020-04-26 ENCOUNTER — Encounter: Payer: Self-pay | Admitting: Family Medicine

## 2020-04-26 ENCOUNTER — Other Ambulatory Visit: Payer: Self-pay | Admitting: Internal Medicine

## 2020-04-26 ENCOUNTER — Telehealth: Payer: Self-pay | Admitting: Family Medicine

## 2020-04-26 ENCOUNTER — Ambulatory Visit
Admission: RE | Admit: 2020-04-26 | Discharge: 2020-04-26 | Disposition: A | Discharge status: Home / Self Care | Payer: Disability Insurance | Source: Ambulatory Visit | Attending: Internal Medicine | Admitting: Internal Medicine

## 2020-04-26 DIAGNOSIS — M5137 Other intervertebral disc degeneration, lumbosacral region: Secondary | ICD-10-CM | POA: Diagnosis not present

## 2020-04-26 DIAGNOSIS — M47814 Spondylosis without myelopathy or radiculopathy, thoracic region: Secondary | ICD-10-CM | POA: Diagnosis not present

## 2020-04-26 DIAGNOSIS — Z0271 Encounter for disability determination: Secondary | ICD-10-CM

## 2020-04-26 NOTE — Telephone Encounter (Signed)
Spoke with pt explained that she  was requesting for Dr Sarajane Jews to send x-ray orders to [facility] since [facility] was not responding to her calls. Pt was sent to [facility] by the disability office. Pt stated that while on the phone with her [facility] faxed over the orders for pt x-ray.

## 2020-04-26 NOTE — Telephone Encounter (Addendum)
Patient is calling and stated that she is at [place] and the place where the provider previously referred her she exceeds the weight capacity and wanted to know if an order can be faxed for lumbar and thoracic ap and lateral to [phone] please advise. [phone]

## 2020-05-02 ENCOUNTER — Other Ambulatory Visit: Payer: Self-pay | Admitting: Family Medicine

## 2020-05-02 DIAGNOSIS — Z1231 Encounter for screening mammogram for malignant neoplasm of breast: Secondary | ICD-10-CM

## 2020-05-04 ENCOUNTER — Ambulatory Visit (INDEPENDENT_AMBULATORY_CARE_PROVIDER_SITE_OTHER): Payer: 59 | Admitting: Family Medicine

## 2020-05-04 ENCOUNTER — Other Ambulatory Visit (HOSPITAL_COMMUNITY): Payer: Self-pay

## 2020-05-04 ENCOUNTER — Encounter: Payer: Self-pay | Admitting: Family Medicine

## 2020-05-04 ENCOUNTER — Other Ambulatory Visit: Payer: Self-pay

## 2020-05-04 VITALS — BP 124/70 | HR 55 | Temp 98.6°F | Wt 323.0 lb

## 2020-05-04 DIAGNOSIS — M79604 Pain in right leg: Secondary | ICD-10-CM

## 2020-05-04 DIAGNOSIS — R221 Localized swelling, mass and lump, neck: Secondary | ICD-10-CM

## 2020-05-04 DIAGNOSIS — F1190 Opioid use, unspecified, uncomplicated: Secondary | ICD-10-CM | POA: Diagnosis not present

## 2020-05-04 DIAGNOSIS — M545 Low back pain, unspecified: Secondary | ICD-10-CM

## 2020-05-04 LAB — BASIC METABOLIC PANEL
BUN: 16 mg/dL (ref 6–23)
CO2: 33 mEq/L — ABNORMAL HIGH (ref 19–32)
Calcium: 7.9 mg/dL — ABNORMAL LOW (ref 8.4–10.5)
Chloride: 102 mEq/L (ref 96–112)
Creatinine, Ser: 1.33 mg/dL — ABNORMAL HIGH (ref 0.40–1.20)
GFR: 42.94 mL/min — ABNORMAL LOW (ref >60.00)
Glucose, Bld: 107 mg/dL — ABNORMAL HIGH (ref 70–99)
Potassium: 4.6 mEq/L (ref 3.5–5.1)
Sodium: 143 mEq/L (ref 135–145)

## 2020-05-04 MED ORDER — HYDROCODONE-ACETAMINOPHEN 5-325 MG PO TABS
1.0000 | ORAL_TABLET | Freq: Three times a day (TID) | ORAL | 0 refills | Status: AC | PRN
Start: 2020-05-19 — End: 2020-05-26

## 2020-05-04 NOTE — Progress Notes (Signed)
   Subjective:    Patient ID: Nancy [name], female    DOB: [birth year], 65 y.o.   MRN: [id number]  HPI Here for pain management. Her pain has been flaring up a little more lately and she often needs to take more than 2 doses a day of the Frederic. Also her insurance company is requiring [place] to give her a 7 day supply while we go through the PA process. She has not worked since last fall, and she is going through the process of getting long term disability.  Indication for chronic opioid: low back pain Medication and dose: Norco 5-325 # pills per month: 60 Last UDS date: 02-17-20 Opioid Treatment Agreement signed (Y/N): 01-21-19 Opioid Treatment Agreement last reviewed with patient:  05-04-20 NCCSRS reviewed this encounter (include red flags): Yes     Review of Systems     Objective:   Physical Exam        Assessment & Plan:  Pain management, we will increase the Norco to every 8 hours as needed.  Alysia Penna, MD

## 2020-05-09 ENCOUNTER — Other Ambulatory Visit (HOSPITAL_COMMUNITY): Payer: Self-pay

## 2020-05-11 ENCOUNTER — Encounter: Payer: Self-pay | Admitting: Family Medicine

## 2020-05-11 ENCOUNTER — Telehealth: Payer: Self-pay | Admitting: Internal Medicine

## 2020-05-11 NOTE — Telephone Encounter (Signed)
Called and spoke with [facility] and they stated that the insurance will not let them fill these medications for the 30 days, that it would have to be for 31 days or 90 days.  Given the ok to fill for the 90 days for both medications and with 1 refill for each.  Nothing further is needed.

## 2020-05-18 ENCOUNTER — Encounter: Payer: Self-pay | Admitting: Family Medicine

## 2020-05-19 MED ORDER — MONTELUKAST SODIUM 10 MG PO TABS: 10.0000 mg | ORAL_TABLET | Freq: Every day | ORAL | 3 refills | Status: DC

## 2020-05-19 NOTE — Telephone Encounter (Signed)
Dr. Shearon Stalls, please advise on patient Nancy message about inhalers. Refill for singulair sent in.  Dr Shearon Stalls Attached are the [facility] formulary alternatives we discussed. Please select the preferred generic option (under the "status" tab)   Also- I'm out of my Singulair. My new insurance company needs you to send a new 90 refill. It's the [employer]. They claim they've contacted you and have not received a response.   Thank you  [name]

## 2020-05-19 NOTE — Telephone Encounter (Signed)
Please talk to [place]. She requires Omeprazole 40 mg BID to control breakthrough GERD episodes. She also requires a total of 30 mg Temazepam every night in order to sleep.

## 2020-05-22 MED ORDER — BUDESONIDE-FORMOTEROL FUMARATE 160-4.5 MCG/ACT IN AERO: 2.0000 | INHALATION_SPRAY | Freq: Two times a day (BID) | RESPIRATORY_TRACT | 12 refills | Status: DC

## 2020-05-22 NOTE — Telephone Encounter (Signed)
[facility]udeson[facility]ol [facility]

## 2020-05-23 ENCOUNTER — Encounter: Payer: Self-pay | Admitting: Family Medicine

## 2020-05-23 NOTE — Telephone Encounter (Signed)
Because she cannot sleep on a lower dosage

## 2020-05-24 ENCOUNTER — Other Ambulatory Visit: Payer: Self-pay

## 2020-05-24 MED ORDER — ESCITALOPRAM OXALATE 10 MG PO TABS: 1.0000 | ORAL_TABLET | Freq: Every day | ORAL | 3 refills | Status: DC

## 2020-05-24 NOTE — Telephone Encounter (Signed)
Called pt pharmacy, stated that medication Temazepam/ Omeprazole needed Prior Auth which was submitted via the telephone with an agent,pt pharmacy stated that it would take 40 to 72 hours for review. Pt was notified

## 2020-05-24 NOTE — Telephone Encounter (Signed)
Rx for Lexapro has been sent per Dr Sarajane Jews, aslo spoke with an agent from [place] submitted a telephone prior Authorization for pt Nancy [name] and Omeprazole, states that the quantity exceeds the insurance coverage. Advise that it should take 40 to 72 hour for the review process. Pt notified

## 2020-05-24 NOTE — Telephone Encounter (Signed)
For the Lexapro, please send in a new rx for #90 with 3 rf. As for the Omeprazole and the Temazepam, please call the number she provided to get things straight

## 2020-05-29 ENCOUNTER — Other Ambulatory Visit: Payer: Self-pay | Admitting: Family Medicine

## 2020-05-30 NOTE — Telephone Encounter (Signed)
Pt Prior Auth for Omeprazole was approved. Approval letter was sent to scanning and pt was notified

## 2020-05-31 ENCOUNTER — Other Ambulatory Visit: Payer: Self-pay

## 2020-05-31 ENCOUNTER — Inpatient Hospital Stay: Admission: RE | Admit: 2020-05-31 | Payer: 59 | Source: Ambulatory Visit

## 2020-05-31 ENCOUNTER — Ambulatory Visit (INDEPENDENT_AMBULATORY_CARE_PROVIDER_SITE_OTHER)
Admission: RE | Admit: 2020-05-31 | Discharge: 2020-05-31 | Disposition: A | Discharge status: Home / Self Care | Payer: 59 | Source: Ambulatory Visit | Attending: Family Medicine | Admitting: Family Medicine

## 2020-05-31 DIAGNOSIS — E890 Postprocedural hypothyroidism: Secondary | ICD-10-CM | POA: Diagnosis not present

## 2020-05-31 DIAGNOSIS — R221 Localized swelling, mass and lump, neck: Secondary | ICD-10-CM | POA: Diagnosis not present

## 2020-05-31 DIAGNOSIS — M47812 Spondylosis without myelopathy or radiculopathy, cervical region: Secondary | ICD-10-CM | POA: Diagnosis not present

## 2020-05-31 DIAGNOSIS — J3489 Other specified disorders of nose and nasal sinuses: Secondary | ICD-10-CM | POA: Diagnosis not present

## 2020-05-31 MED ORDER — IOHEXOL 300 MG/ML  SOLN
60.0000 mL | Freq: Once | INTRAMUSCULAR | Status: AC | PRN
  Administered 2020-05-31: 60 mL via INTRAVENOUS

## 2020-06-07 ENCOUNTER — Encounter: Payer: Self-pay | Admitting: Family Medicine

## 2020-06-08 ENCOUNTER — Other Ambulatory Visit (HOSPITAL_COMMUNITY): Payer: Self-pay

## 2020-06-08 MED ORDER — BUDESONIDE-FORMOTEROL FUMARATE 160-4.5 MCG/ACT IN AERO
2.0000 | INHALATION_SPRAY | Freq: Two times a day (BID) | RESPIRATORY_TRACT | 3 refills | Status: DC
Start: 2020-06-08 — End: 2021-08-21

## 2020-06-08 NOTE — Telephone Encounter (Signed)
Pt PA for Hydrocodone was approved, Nancy [name] state that they need a Medical Necessity letter for pt Nancy [name] Rx  explaining reasons  pt needs a higher quantity for this  medication. [place] requests a new Rx for Hydrocodone.

## 2020-06-08 NOTE — Telephone Encounter (Signed)
Received Symbicort RX. It was sent to the mail order pharmacy but with only 1 inhaler being dispensed each month. I have corrected the RX.

## 2020-06-08 NOTE — Telephone Encounter (Signed)
She already has a refill available for the Temazepam to last her until August. As for the hydrocodone, where do we stand on the PA?

## 2020-06-09 ENCOUNTER — Other Ambulatory Visit (HOSPITAL_COMMUNITY): Payer: Self-pay

## 2020-06-09 MED ORDER — HYDROCODONE-ACETAMINOPHEN 5-325 MG PO TABS
1.0000 | ORAL_TABLET | Freq: Two times a day (BID) | ORAL | 0 refills | Status: AC | PRN
  Filled 2020-06-09: qty 60, 30d supply, fill #0

## 2020-06-09 MED ORDER — TEMAZEPAM 30 MG PO CAPS
30.0000 mg | ORAL_CAPSULE | Freq: Every evening | ORAL | 1 refills | Status: DC | PRN
  Filled 2020-06-09: qty 15, 30d supply, fill #0
  Filled 2020-06-28: qty 15, 30d supply, fill #1
  Filled 2020-06-29: qty 90, 90d supply, fill #1
  Filled 2020-09-28: qty 15, 30d supply, fill #2
  Filled 2020-10-05: qty 60, 60d supply, fill #3

## 2020-06-09 MED ORDER — HYDROCODONE-ACETAMINOPHEN 5-325 MG PO TABS: 1.0000 | ORAL_TABLET | Freq: Two times a day (BID) | ORAL | 0 refills | Status: AC | PRN

## 2020-06-09 NOTE — Telephone Encounter (Signed)
Left detailed message regarding her Rx for Hydrocodone/ Temazepam, advised pt to contact her pharmacy for her medication refills

## 2020-06-09 NOTE — Telephone Encounter (Signed)
I sent in 2 months of Norco and I changed the rx for Temazepam to taking one 30 mg capsule at night

## 2020-06-10 ENCOUNTER — Other Ambulatory Visit (HOSPITAL_COMMUNITY): Payer: Self-pay

## 2020-06-12 ENCOUNTER — Other Ambulatory Visit (HOSPITAL_COMMUNITY): Payer: Self-pay

## 2020-06-27 ENCOUNTER — Other Ambulatory Visit: Payer: Self-pay

## 2020-06-27 ENCOUNTER — Ambulatory Visit
Admission: RE | Admit: 2020-06-27 | Discharge: 2020-06-27 | Disposition: A | Discharge status: Home / Self Care | Payer: 59 | Source: Ambulatory Visit

## 2020-06-27 DIAGNOSIS — Z1231 Encounter for screening mammogram for malignant neoplasm of breast: Secondary | ICD-10-CM | POA: Diagnosis not present

## 2020-06-28 ENCOUNTER — Other Ambulatory Visit (HOSPITAL_COMMUNITY): Payer: Self-pay

## 2020-06-29 ENCOUNTER — Other Ambulatory Visit (HOSPITAL_COMMUNITY): Payer: Self-pay

## 2020-07-06 ENCOUNTER — Ambulatory Visit: Payer: 59 | Admitting: Internal Medicine

## 2020-07-13 ENCOUNTER — Ambulatory Visit: Payer: 59 | Admitting: Internal Medicine

## 2020-08-01 DIAGNOSIS — J209 Acute bronchitis, unspecified: Secondary | ICD-10-CM | POA: Diagnosis not present

## 2020-08-01 DIAGNOSIS — Z03818 Encounter for observation for suspected exposure to other biological agents ruled out: Secondary | ICD-10-CM | POA: Diagnosis not present

## 2020-08-01 DIAGNOSIS — Z20822 Contact with and (suspected) exposure to covid-19: Secondary | ICD-10-CM | POA: Diagnosis not present

## 2020-08-28 ENCOUNTER — Encounter: Payer: Self-pay | Admitting: Family Medicine

## 2020-08-28 DIAGNOSIS — Z0279 Encounter for issue of other medical certificate: Secondary | ICD-10-CM

## 2020-09-28 ENCOUNTER — Other Ambulatory Visit (HOSPITAL_COMMUNITY): Payer: Self-pay

## 2020-10-03 ENCOUNTER — Other Ambulatory Visit (HOSPITAL_COMMUNITY): Payer: Self-pay

## 2020-10-05 ENCOUNTER — Other Ambulatory Visit (HOSPITAL_COMMUNITY): Payer: Self-pay

## 2020-10-26 ENCOUNTER — Encounter: Payer: Self-pay | Admitting: Family Medicine

## 2020-10-26 ENCOUNTER — Other Ambulatory Visit: Payer: Self-pay

## 2020-10-26 MED ORDER — OMEPRAZOLE 40 MG PO CPDR: DELAYED_RELEASE_CAPSULE | Freq: Two times a day (BID) | ORAL | 1 refills | Status: DC

## 2020-11-27 ENCOUNTER — Ambulatory Visit: Payer: 59 | Admitting: Family Medicine

## 2020-12-01 DIAGNOSIS — J0120 Acute ethmoidal sinusitis, unspecified: Secondary | ICD-10-CM | POA: Diagnosis not present

## 2020-12-01 DIAGNOSIS — H66001 Acute suppurative otitis media without spontaneous rupture of ear drum, right ear: Secondary | ICD-10-CM | POA: Diagnosis not present

## 2020-12-01 DIAGNOSIS — R062 Wheezing: Secondary | ICD-10-CM | POA: Diagnosis not present

## 2020-12-01 DIAGNOSIS — R52 Pain, unspecified: Secondary | ICD-10-CM | POA: Diagnosis not present

## 2020-12-04 ENCOUNTER — Ambulatory Visit: Payer: 59 | Admitting: Family Medicine

## 2020-12-05 ENCOUNTER — Telehealth: Payer: Self-pay | Admitting: Family Medicine

## 2020-12-05 ENCOUNTER — Telehealth: Payer: Self-pay

## 2020-12-05 ENCOUNTER — Other Ambulatory Visit: Payer: Self-pay

## 2020-12-05 NOTE — Telephone Encounter (Signed)
Patient called to inform Dr. Sarajane Jews she is negative for Covid

## 2020-12-05 NOTE — Telephone Encounter (Signed)
See message.

## 2020-12-05 NOTE — Telephone Encounter (Signed)
Patient calling in with respiratory symptoms: Shortness of breath, chest pain, palpitations or other red words send to Triage  Does the patient have a fever over 100, cough, congestion, sore throat, runny nose, lost of taste/smell within the last 5 days?    Yes: "have you tested for Covid?" [x]     No, patient has not tested for covid: "Do you have access to test from home?" If yes [x]  please ask them to test from home & notify us of the results to determine if VV or in person visit. If no []  schedule patient for an in person visit. Let them know "you will have to arrive 42mns prior to your appt time to be Covid tested. Please park in back of office at the cone & call [phone] let the staff know you have arrived. A staff member will meet you at your car to do a rapid covid test. Once the test has resulted you will be notified by phone of your results to determine if appt will remain an in person visit or be converted to a virtual/phone visit."   [place] If no availability for virtual visit in office,  please schedule another [place] office  If no availability [place], please instruct patient that they can schedule an evisit or virtual visit through their mychart account. Visits up to 8pm  patients can be seen in office 5 days after positive COVID test

## 2020-12-06 ENCOUNTER — Encounter: Payer: Self-pay | Admitting: Family Medicine

## 2020-12-06 ENCOUNTER — Ambulatory Visit (INDEPENDENT_AMBULATORY_CARE_PROVIDER_SITE_OTHER): Payer: 59 | Admitting: Family Medicine

## 2020-12-06 VITALS — BP 120/70 | HR 68 | Temp 99.0°F

## 2020-12-06 DIAGNOSIS — J45909 Unspecified asthma, uncomplicated: Secondary | ICD-10-CM

## 2020-12-06 DIAGNOSIS — J209 Acute bronchitis, unspecified: Secondary | ICD-10-CM | POA: Diagnosis not present

## 2020-12-06 LAB — BASIC METABOLIC PANEL
BUN: 19 mg/dL (ref 6–23)
CO2: 32 mEq/L (ref 19–32)
Calcium: 8.2 mg/dL — ABNORMAL LOW (ref 8.4–10.5)
Chloride: 99 mEq/L (ref 96–112)
Creatinine, Ser: 1.3 mg/dL — ABNORMAL HIGH (ref 0.40–1.20)
GFR: 43.95 mL/min — ABNORMAL LOW (ref >60.00)
Glucose, Bld: 312 mg/dL — ABNORMAL HIGH (ref 70–99)
Potassium: 4.2 mEq/L (ref 3.5–5.1)
Sodium: 140 mEq/L (ref 135–145)

## 2020-12-06 LAB — MAGNESIUM: Magnesium: 1.3 mg/dL — ABNORMAL LOW (ref 1.5–2.5)

## 2020-12-06 MED ORDER — METHYLPREDNISOLONE 4 MG PO TBPK: ORAL_TABLET | ORAL | 0 refills | Status: DC

## 2020-12-06 MED ORDER — CALCITRIOL 0.25 MCG PO CAPS: 0.2500 ug | ORAL_CAPSULE | Freq: Two times a day (BID) | ORAL | 3 refills | Status: DC

## 2020-12-06 MED ORDER — AMOXICILLIN-POT CLAVULANATE 875-125 MG PO TABS: 1.0000 | ORAL_TABLET | Freq: Two times a day (BID) | ORAL | 0 refills | Status: DC

## 2020-12-06 NOTE — Progress Notes (Signed)
   Subjective:    Patient ID: Nancy [name], female    DOB: Oct 15, 1955, 65 y.o.   MRN: [id number]  HPI Here for several issues. First about 10 days ago she developed fevers, body aches, ST, and a dry cough. She went to [facility] on [facility]-20 and tested negative for flu. She was given 5 days of Amoxicillin, but this has not helped. She is wheezing and is SOB, as would be expected given she has asthma. She has been treating herself with her nebulizer several times a day. She tested negative for the Covid-19 virus last night. She notes that her granddaughter was treated for flu 3 weeks ago. In addition, she has a hx of low serum calcium and magnesium, and she asks to have these levels checked today. Lately she has been getting the same type of muscle cramps that she did when the low calcium was discovered a few years ago. She still takes a total of 600 mg of calcium daily and Calcitriol BID.    Review of Systems  Constitutional:  Positive for fever.  HENT:  Positive for congestion, postnasal drip and sore throat.   Eyes: Negative.   Respiratory:  Positive for cough, chest tightness, shortness of breath and wheezing.   Cardiovascular:  Negative for chest pain.  Gastrointestinal: Negative.   Musculoskeletal:  Positive for myalgias.      Objective:   Physical Exam Constitutional:      Appearance: Normal appearance. She is not ill-appearing.  HENT:     Right Ear: Tympanic membrane, ear canal and external ear normal.     Left Ear: Tympanic membrane, ear canal and external ear normal.     Nose: Nose normal.     Mouth/Throat:     Pharynx: Oropharynx is clear.  Eyes:     Conjunctiva/sclera: Conjunctivae normal.  Cardiovascular:     Rate and Rhythm: Normal rate and regular rhythm.     Pulses: Normal pulses.     Heart sounds: Normal heart sounds.  Pulmonary:     Effort: Pulmonary effort is normal. No respiratory distress.     Breath sounds: No rhonchi or rales.     Comments: Scattered  soft wheezes  Lymphadenopathy:     Cervical: No cervical adenopathy.  Neurological:     Mental Status: She is alert.          Assessment & Plan:  She has a bronchitis that is causing her asthma to flare up. We will treat with 10 days of Augmentin and a Medrol dose pack. Use the nebulizer as needed. We will also check a BMET and Mg today to see if the hypocalcemia or hypomagnesemia is controlled. We spent a total of ( 30  ) minutes reviewing records and discussing these issues.  Alysia Penna, MD

## 2020-12-15 ENCOUNTER — Other Ambulatory Visit: Payer: Self-pay | Admitting: Family Medicine

## 2020-12-15 ENCOUNTER — Other Ambulatory Visit (HOSPITAL_COMMUNITY): Payer: Self-pay

## 2020-12-15 NOTE — Telephone Encounter (Signed)
Last refill- 06/09/20 Last office visit-12/06/20  No future office visit scheduled

## 2020-12-18 ENCOUNTER — Other Ambulatory Visit (HOSPITAL_COMMUNITY): Payer: Self-pay

## 2020-12-19 ENCOUNTER — Other Ambulatory Visit (HOSPITAL_COMMUNITY): Payer: Self-pay

## 2020-12-19 ENCOUNTER — Other Ambulatory Visit: Payer: Self-pay | Admitting: Family Medicine

## 2020-12-20 ENCOUNTER — Other Ambulatory Visit (HOSPITAL_COMMUNITY): Payer: Self-pay

## 2020-12-20 MED ORDER — TEMAZEPAM 30 MG PO CAPS
30.0000 mg | ORAL_CAPSULE | Freq: Every evening | ORAL | 0 refills | Status: DC | PRN
  Filled 2020-12-20: qty 15, 30d supply, fill #0
  Filled ????-??-??: fill #1

## 2020-12-20 NOTE — Telephone Encounter (Signed)
Pt LOV was on 12/06/2020 Last refill was done on 06/09/2020 for 90 tablets Pt PCP is out of the office  Please advise

## 2020-12-24 ENCOUNTER — Encounter: Payer: Self-pay | Admitting: Family Medicine

## 2020-12-25 ENCOUNTER — Other Ambulatory Visit (HOSPITAL_COMMUNITY): Payer: Self-pay

## 2020-12-25 NOTE — Telephone Encounter (Signed)
Advised pt that Dr Sarajane Jews was out of the office and another Dr approved a short term refill until Dr Sarajane Jews is back in the office, Pt requests a 90 days supply to her pharmacy. Please advise

## 2020-12-26 ENCOUNTER — Other Ambulatory Visit: Payer: Self-pay | Admitting: Family Medicine

## 2020-12-26 ENCOUNTER — Other Ambulatory Visit (HOSPITAL_COMMUNITY): Payer: Self-pay

## 2020-12-26 MED ORDER — TEMAZEPAM 30 MG PO CAPS
30.0000 mg | ORAL_CAPSULE | Freq: Every evening | ORAL | 1 refills | Status: DC | PRN
  Filled 2020-12-26 – 2021-01-02 (×2): qty 90, 90d supply, fill #0
  Filled 2021-03-28: qty 90, 90d supply, fill #1
  Filled 2021-03-30: qty 15, 30d supply, fill #1
  Filled 2021-04-02: qty 75, 75d supply, fill #2

## 2020-12-26 NOTE — Telephone Encounter (Signed)
I sent in for #90 with one refill

## 2020-12-27 ENCOUNTER — Other Ambulatory Visit: Payer: Self-pay

## 2020-12-27 ENCOUNTER — Encounter: Payer: Self-pay | Admitting: Family Medicine

## 2020-12-27 MED ORDER — LEVOTHYROXINE SODIUM 200 MCG PO TABS: 200.0000 ug | ORAL_TABLET | Freq: Every day | ORAL | 0 refills | Status: DC

## 2021-01-02 ENCOUNTER — Other Ambulatory Visit (HOSPITAL_COMMUNITY): Payer: Self-pay

## 2021-01-02 ENCOUNTER — Other Ambulatory Visit: Payer: Self-pay | Admitting: Family Medicine

## 2021-01-02 NOTE — Telephone Encounter (Signed)
Message sent to pt via My Chart

## 2021-01-02 NOTE — Telephone Encounter (Signed)
She needs a PMV

## 2021-01-03 ENCOUNTER — Other Ambulatory Visit (HOSPITAL_COMMUNITY): Payer: Self-pay

## 2021-01-05 ENCOUNTER — Other Ambulatory Visit (HOSPITAL_COMMUNITY): Payer: Self-pay

## 2021-01-08 ENCOUNTER — Other Ambulatory Visit (HOSPITAL_COMMUNITY): Payer: Self-pay

## 2021-01-12 ENCOUNTER — Other Ambulatory Visit (HOSPITAL_COMMUNITY): Payer: Self-pay

## 2021-01-16 ENCOUNTER — Other Ambulatory Visit (HOSPITAL_COMMUNITY): Payer: Self-pay

## 2021-01-19 ENCOUNTER — Telehealth (INDEPENDENT_AMBULATORY_CARE_PROVIDER_SITE_OTHER): Payer: 59 | Admitting: Family Medicine

## 2021-01-19 ENCOUNTER — Other Ambulatory Visit (HOSPITAL_COMMUNITY): Payer: Self-pay

## 2021-01-19 ENCOUNTER — Encounter: Payer: Self-pay | Admitting: Family Medicine

## 2021-01-19 DIAGNOSIS — R69 Illness, unspecified: Secondary | ICD-10-CM | POA: Diagnosis not present

## 2021-01-19 DIAGNOSIS — M545 Low back pain, unspecified: Secondary | ICD-10-CM

## 2021-01-19 DIAGNOSIS — M79604 Pain in right leg: Secondary | ICD-10-CM

## 2021-01-19 DIAGNOSIS — F1190 Opioid use, unspecified, uncomplicated: Secondary | ICD-10-CM | POA: Diagnosis not present

## 2021-01-19 MED ORDER — HYDROCODONE-ACETAMINOPHEN 5-325 MG PO TABS: 1.0000 | ORAL_TABLET | Freq: Two times a day (BID) | ORAL | 0 refills | Status: DC | PRN

## 2021-01-19 MED ORDER — HYDROCODONE-ACETAMINOPHEN 5-325 MG PO TABS
1.0000 | ORAL_TABLET | Freq: Two times a day (BID) | ORAL | 0 refills | Status: DC | PRN
  Filled 2021-01-19: qty 60, 30d supply, fill #0

## 2021-01-19 NOTE — Progress Notes (Signed)
Subjective:    Patient ID: Nancy [name], female    DOB: [birth year], 65 y.o.   MRN: [id number]  HPI Virtual Visit via Video Note  I connected with the patient on 01/19/21 at  8:30 AM EST by a video enabled telemedicine application and verified that I am speaking with the correct person using two identifiers.  Location patient: home Location provider:work or home office Persons participating in the virtual visit: patient, provider  I discussed the limitations of evaluation and management by telemedicine and the availability of in person appointments. The patient expressed understanding and agreed to proceed.   HPI: Here for pain management, she is doing well.    ROS: See pertinent positives and negatives per HPI.  Past Medical History:  Diagnosis Date   Allergic rhinitis    gets shots per Dr. Harold Hedge    Anemia 2001   Anginal pain ([place])    Asthma    COPD (chronic obstructive pulmonary disease) ([place])    "CXR just showed mild COPD" (10/24/2011)   Coronary artery disease    had MI in 2001, seees Dr. Fletcher Anon at [facility]   GERD (gastroesophageal reflux disease)    Gynecological examination    sees Dr. Cherylann Banas    History of cardiac catheterization 07-24-07   showed nonconclusive disease   Hyperlipidemia    Hypertension    Hypothyroidism    Migraines    "have a history of migraines; haven't had one for years" (10/24/2011)   Myocardial infarction [facility]) 2001?   in her 65's    Past Surgical History:  Procedure Laterality Date   CARDIAC CATHETERIZATION  2009   CESAREAN SECTION  1984   COLONOSCOPY  10-06-08   per Dr. Henrene Pastor, benign polyps, repeat in 10 yrs    ESOPHAGOGASTRODUODENOSCOPY  05/29/2017   per Dr. Henrene Pastor, normal except slight gastritis    HERNIA REPAIR  ~ 2007   ventral hernia repair   THYROIDECTOMY  1990's   TONSILLECTOMY     "when I was a kid"    Family History  Problem Relation Age of Onset   Pulmonary fibrosis Mother    COPD Mother    Breast  cancer Mother        93's   Coronary artery disease Father    Hypertension Father    Cancer Other        breast,colon,prostate   Alcohol abuse Other    Hyperlipidemia Other    Hypertension Other    Stroke Other    Heart disease Other    COPD Other    Diabetes Other    Colon cancer Paternal Grandfather    Stomach cancer Other    Esophageal cancer Neg Hx    Rectal cancer Neg Hx      Current Outpatient Medications:    albuterol (VENTOLIN HFA) 108 (90 Base) MCG/ACT inhaler, Inhale 2 puffs into the lungs every 4 (four) hours as needed for wheezing or shortness of breath., Disp: 18 g, Rfl: 11   amoxicillin-clavulanate (AUGMENTIN) 875-125 MG tablet, Take 1 tablet by mouth 2 (two) times daily., Disp: 20 tablet, Rfl: 0   aspirin 81 MG tablet, Take 81 mg by mouth at bedtime. , Disp: , Rfl:    atorvastatin (LIPITOR) 80 MG tablet, TAKE 1 TABLET DAILY, Disp: 90 tablet, Rfl: 3   budesonide-formoterol (SYMBICORT) 160-4.5 MCG/ACT inhaler, Inhale 2 puffs into the lungs in the morning and at bedtime., Disp: 30.6 g, Rfl: 3   calcitRIOL (ROCALTROL) 0.25 MCG capsule, Take  1 capsule (0.25 mcg total) by mouth 2 (two) times daily., Disp: 180 capsule, Rfl: 3   calcium citrate (CALCITRATE) 950 (200 Ca) MG tablet, Take 1 tablet (200 mg of elemental calcium total) by mouth 3 (three) times daily., Disp: 3 tablet, Rfl: 0   cetirizine (ZYRTEC) 10 MG tablet, Take 10 mg by mouth daily., Disp: , Rfl:    diphenhydramine-acetaminophen (TYLENOL PM) 25-500 MG TABS tablet, Take 1 tablet by mouth at bedtime., Disp: , Rfl:    escitalopram (LEXAPRO) 10 MG tablet, Take 1 tablet (10 mg total) by mouth daily., Disp: 90 tablet, Rfl: 3   furosemide (LASIX) 20 MG tablet, Take 1 tablet (20 mg total) by mouth daily., Disp: 90 tablet, Rfl: 3   ibuprofen (ADVIL) 200 MG tablet, Please hold this medication pending your repeat labs and/or PCP follow-up secondary to kidney function, Disp: , Rfl:    KRILL OIL PO, Take 1,000 mg by mouth at  bedtime. , Disp: , Rfl:    levothyroxine (SYNTHROID) 200 MCG tablet, Take 1 tablet (200 mcg total) by mouth daily., Disp: 90 tablet, Rfl: 0   Magnesium Oxide 400 (240 Mg) MG TABS, TAKE 1 TABLET BY MOUTH ONCE DAILY, Disp: 90 tablet, Rfl: 3   metoprolol tartrate (LOPRESSOR) 25 MG tablet, TAKE 1 TABLET TWICE A DAY, Disp: 180 tablet, Rfl: 3   montelukast (SINGULAIR) 10 MG tablet, Take 1 tablet (10 mg total) by mouth at bedtime., Disp: 90 tablet, Rfl: 3   Multiple Vitamin (MULTIVITAMIN) tablet, Take 1 tablet by mouth at bedtime., Disp: , Rfl:    Multiple Vitamins-Minerals (MULTIVITAMIN WITH MINERALS) tablet, Take 1 tablet by mouth daily., Disp: , Rfl:    omeprazole (PRILOSEC) 40 MG capsule, TAKE 1 CAPSULE BY MOUTH TWICE DAILY, Disp: 180 capsule, Rfl: 1   spironolactone (ALDACTONE) 25 MG tablet, TAKE 1 TABLET DAILY, Disp: 90 tablet, Rfl: 3   temazepam (RESTORIL) 30 MG capsule, Take 1 capsule (30 mg total) by mouth at bedtime as needed for sleep., Disp: 90 capsule, Rfl: 1   albuterol (PROVENTIL) (2.5 MG/3ML) 0.083% nebulizer solution, INHALE 3 MLS (ONE VIAL) BY NEBULIZATION EVERY 4 HOURS AS NEEDED FOR WHEEZING OR SHORTNESS OF BREATH., Disp: 75 mL, Rfl: 11  EXAM:  VITALS per patient if applicable:  GENERAL: alert, oriented, appears well and in no acute distress  HEENT: atraumatic, conjunttiva clear, no obvious abnormalities on inspection of external nose and ears  NECK: normal movements of the head and neck  LUNGS: on inspection no signs of respiratory distress, breathing rate appears normal, no obvious gross SOB, gasping or wheezing  CV: no obvious cyanosis  MS: moves all visible extremities without noticeable abnormality  PSYCH/NEURO: pleasant and cooperative, no obvious depression or anxiety, speech and thought processing grossly intact  ASSESSMENT AND PLAN: Pain management. Indication for chronic opioid: low back pain Medication and dose: Norco 5-325 # pills per month: 60 Last UDS  date: 02-17-20 Opioid Treatment Agreement signed (Y/N): 01-21-19 Opioid Treatment Agreement last reviewed with patient:  01-19-21 [facility] reviewed this encounter (include red flags): Yes Meds were refilled.  Alysia Penna, MD  Discussed the following assessment and plan:  No diagnosis found.     I discussed the assessment and treatment plan with the patient. The patient was provided an opportunity to ask questions and all were answered. The patient agreed with the plan and demonstrated an understanding of the instructions.   The patient was advised to call back or seek an in-person evaluation if the symptoms worsen  or if the condition fails to improve as anticipated.      Review of Systems     Objective:   Physical Exam        Assessment & Plan:

## 2021-01-22 ENCOUNTER — Encounter: Payer: Self-pay | Admitting: Family Medicine

## 2021-01-23 ENCOUNTER — Other Ambulatory Visit (HOSPITAL_COMMUNITY): Payer: Self-pay

## 2021-01-23 MED ORDER — HYDROCODONE-ACETAMINOPHEN 5-325 MG PO TABS
1.0000 | ORAL_TABLET | Freq: Two times a day (BID) | ORAL | 0 refills | Status: DC | PRN
  Filled 2021-01-23 – 2021-01-30 (×2): qty 60, 30d supply, fill #0

## 2021-01-23 NOTE — Telephone Encounter (Signed)
Sorry I forgot that [employer] cannot take 3 months worth of prescriptions. The only one showing would be the third month. I just sent in a refill for one month only dated today

## 2021-01-30 ENCOUNTER — Other Ambulatory Visit (HOSPITAL_COMMUNITY): Payer: Self-pay

## 2021-01-30 ENCOUNTER — Other Ambulatory Visit: Payer: Self-pay | Admitting: Family Medicine

## 2021-01-31 ENCOUNTER — Other Ambulatory Visit (HOSPITAL_COMMUNITY): Payer: Self-pay

## 2021-01-31 MED ORDER — HYDROCODONE-ACETAMINOPHEN 5-325 MG PO TABS
1.0000 | ORAL_TABLET | Freq: Two times a day (BID) | ORAL | 0 refills | Status: AC | PRN
Start: 2021-01-31 — End: 2021-03-03
  Filled 2021-01-31: qty 60, 30d supply, fill #0

## 2021-01-31 NOTE — Telephone Encounter (Signed)
Done

## 2021-02-23 DIAGNOSIS — G441 Vascular headache, not elsewhere classified: Secondary | ICD-10-CM | POA: Diagnosis not present

## 2021-02-23 DIAGNOSIS — R0981 Nasal congestion: Secondary | ICD-10-CM | POA: Diagnosis not present

## 2021-02-23 DIAGNOSIS — J209 Acute bronchitis, unspecified: Secondary | ICD-10-CM | POA: Diagnosis not present

## 2021-02-23 DIAGNOSIS — J029 Acute pharyngitis, unspecified: Secondary | ICD-10-CM | POA: Diagnosis not present

## 2021-03-02 ENCOUNTER — Encounter: Payer: Self-pay | Admitting: Family Medicine

## 2021-03-02 ENCOUNTER — Ambulatory Visit (INDEPENDENT_AMBULATORY_CARE_PROVIDER_SITE_OTHER): Payer: 59 | Admitting: Family Medicine

## 2021-03-02 VITALS — BP 132/80 | HR 60 | Temp 98.6°F

## 2021-03-02 DIAGNOSIS — J209 Acute bronchitis, unspecified: Secondary | ICD-10-CM

## 2021-03-02 MED ORDER — PREDNISONE 10 MG PO TABS: ORAL_TABLET | ORAL | 0 refills | Status: DC

## 2021-03-02 MED ORDER — AMOXICILLIN-POT CLAVULANATE 875-125 MG PO TABS: 1.0000 | ORAL_TABLET | Freq: Two times a day (BID) | ORAL | 0 refills | Status: DC

## 2021-03-02 MED ORDER — IPRATROPIUM-ALBUTEROL 0.5-2.5 (3) MG/3ML IN SOLN
3.0000 mL | Freq: Once | RESPIRATORY_TRACT | Status: AC
  Administered 2021-03-02: 3 mL via RESPIRATORY_TRACT

## 2021-03-02 MED ORDER — CEFTRIAXONE SODIUM 1 G IJ SOLR
1.0000 g | Freq: Once | INTRAMUSCULAR | Status: AC
  Administered 2021-03-02: 1 g via INTRAMUSCULAR

## 2021-03-02 NOTE — Addendum Note (Signed)
Addended by: Wyvonne Lenz on: 03/02/2021 01:54 PM   Modules accepted: Orders

## 2021-03-02 NOTE — Progress Notes (Signed)
° °  Subjective:    Patient ID: Nancy [name], female    DOB: 08-22-1955, 66 y.o.   MRN: [id number]  HPI Here for 10 days of chest congestion, wheezing, and coughing up yellow sputum. No fever or chest pain. She went to [facility] on 02-23-21 and they gave her a Zpack and a Medrol dose pack. These have not helped at all . She is using her nebulizer with albuterol at home 6-8 time a day. This helps for about 30 minutes only.    Review of Systems  Constitutional: Negative.   HENT:  Positive for congestion, postnasal drip and sinus pressure. Negative for sore throat.   Eyes: Negative.   Respiratory:  Positive for cough, chest tightness and shortness of breath.   Cardiovascular: Negative.       Objective:   Physical Exam Constitutional:      Comments: Coughing frequently   HENT:     Right Ear: Tympanic membrane, ear canal and external ear normal.     Left Ear: Tympanic membrane, ear canal and external ear normal.     Nose: Nose normal.     Mouth/Throat:     Pharynx: Oropharynx is clear.  Eyes:     Conjunctiva/sclera: Conjunctivae normal.  Cardiovascular:     Rate and Rhythm: Normal rate and regular rhythm.     Pulses: Normal pulses.     Heart sounds: Normal heart sounds.  Pulmonary:     Breath sounds: No rales.     Comments: Diffuse wheezing and rhonchi  Lymphadenopathy:     Cervical: No cervical adenopathy.  Neurological:     Mental Status: She is alert.   After the nebulizer treatment, her lungs sounded much better with less wheezing and better air flow.        Assessment & Plan:  Bronchitis with a large amount of bronchospasm. Given a shot of Rocephin. She will then take 10 days of Augmentin and a 15 day taper of Prednisone (starting with 60 mg a day). Recheck as needed.  We spent a total of ( 34  ) minutes reviewing records and discussing these issues.   Alysia Penna, MD

## 2021-03-06 MED ORDER — CEFTRIAXONE SODIUM 1 G IJ SOLR
1.0000 g | Freq: Once | INTRAMUSCULAR | Status: AC
  Administered 2021-03-06: 1 g via INTRAMUSCULAR

## 2021-03-06 NOTE — Addendum Note (Signed)
Addended by: Wyvonne Lenz on: 03/06/2021 08:06 AM   Modules accepted: Orders

## 2021-03-28 ENCOUNTER — Other Ambulatory Visit: Payer: Self-pay | Admitting: Family Medicine

## 2021-03-28 ENCOUNTER — Other Ambulatory Visit (HOSPITAL_COMMUNITY): Payer: Self-pay

## 2021-03-28 DIAGNOSIS — E039 Hypothyroidism, unspecified: Secondary | ICD-10-CM

## 2021-03-29 MED ORDER — LEVOTHYROXINE SODIUM 200 MCG PO TABS: 200.0000 ug | ORAL_TABLET | Freq: Every day | ORAL | 0 refills | Status: DC

## 2021-03-30 ENCOUNTER — Other Ambulatory Visit (HOSPITAL_COMMUNITY): Payer: Self-pay

## 2021-04-02 ENCOUNTER — Other Ambulatory Visit (HOSPITAL_COMMUNITY): Payer: Self-pay

## 2021-04-04 ENCOUNTER — Encounter: Payer: Self-pay | Admitting: Family Medicine

## 2021-04-04 ENCOUNTER — Ambulatory Visit (INDEPENDENT_AMBULATORY_CARE_PROVIDER_SITE_OTHER): Payer: 59 | Admitting: Family Medicine

## 2021-04-04 VITALS — BP 124/78 | HR 65 | Temp 98.5°F

## 2021-04-04 DIAGNOSIS — E039 Hypothyroidism, unspecified: Secondary | ICD-10-CM

## 2021-04-04 DIAGNOSIS — F418 Other specified anxiety disorders: Secondary | ICD-10-CM | POA: Diagnosis not present

## 2021-04-04 DIAGNOSIS — E669 Obesity, unspecified: Secondary | ICD-10-CM

## 2021-04-04 DIAGNOSIS — R7303 Prediabetes: Secondary | ICD-10-CM | POA: Diagnosis not present

## 2021-04-04 DIAGNOSIS — I10 Essential (primary) hypertension: Secondary | ICD-10-CM | POA: Diagnosis not present

## 2021-04-04 DIAGNOSIS — I5032 Chronic diastolic (congestive) heart failure: Secondary | ICD-10-CM | POA: Diagnosis not present

## 2021-04-04 DIAGNOSIS — N1831 Chronic kidney disease, stage 3a: Secondary | ICD-10-CM | POA: Diagnosis not present

## 2021-04-04 LAB — CBC WITH DIFFERENTIAL/PLATELET
Basophils Absolute: 0.1 10*3/uL (ref 0.0–0.1)
Basophils Relative: 1.2 % (ref 0.0–3.0)
Eosinophils Absolute: 0.2 10*3/uL (ref 0.0–0.7)
Eosinophils Relative: 3.8 % (ref 0.0–5.0)
HCT: 46.9 % — ABNORMAL HIGH (ref 36.0–46.0)
Hemoglobin: 15.6 g/dL — ABNORMAL HIGH (ref 12.0–15.0)
Lymphocytes Relative: 16.4 % (ref 12.0–46.0)
Lymphs Abs: 0.9 10*3/uL (ref 0.7–4.0)
MCHC: 33.2 g/dL (ref 30.0–36.0)
MCV: 89.8 fl (ref 78.0–100.0)
Monocytes Absolute: 0.5 10*3/uL (ref 0.1–1.0)
Monocytes Relative: 8.4 % (ref 3.0–12.0)
Neutro Abs: 4 10*3/uL (ref 1.4–7.7)
Neutrophils Relative %: 70.2 % (ref 43.0–77.0)
Platelets: 109 10*3/uL — ABNORMAL LOW (ref 150.0–400.0)
RBC: 5.22 Mil/uL — ABNORMAL HIGH (ref 3.87–5.11)
RDW: 16.3 % — ABNORMAL HIGH (ref 11.5–15.5)
WBC: 5.7 10*3/uL (ref 4.0–10.5)

## 2021-04-04 LAB — BASIC METABOLIC PANEL
BUN: 14 mg/dL (ref 6–23)
CO2: 33 mEq/L — ABNORMAL HIGH (ref 19–32)
Calcium: 8.9 mg/dL (ref 8.4–10.5)
Chloride: 98 mEq/L (ref 96–112)
Creatinine, Ser: 1.15 mg/dL (ref 0.40–1.20)
GFR: 50.8 mL/min — ABNORMAL LOW (ref >60.00)
Glucose, Bld: 214 mg/dL — ABNORMAL HIGH (ref 70–99)
Potassium: 4.9 mEq/L (ref 3.5–5.1)
Sodium: 140 mEq/L (ref 135–145)

## 2021-04-04 LAB — T4, FREE: Free T4: 0.93 ng/dL (ref 0.60–1.60)

## 2021-04-04 LAB — HEPATIC FUNCTION PANEL
ALT: 34 U/L (ref 0–35)
AST: 31 U/L (ref 0–37)
Albumin: 3.9 g/dL (ref 3.5–5.2)
Alkaline Phosphatase: 73 U/L (ref 39–117)
Bilirubin, Direct: 0.1 mg/dL (ref 0.0–0.3)
Total Bilirubin: 0.8 mg/dL (ref 0.2–1.2)
Total Protein: 6.5 g/dL (ref 6.0–8.3)

## 2021-04-04 LAB — TSH: TSH: 5.71 u[IU]/mL — ABNORMAL HIGH (ref 0.35–5.50)

## 2021-04-04 LAB — HEMOGLOBIN A1C: Hgb A1c MFr Bld: 9 % — ABNORMAL HIGH (ref 4.6–6.5)

## 2021-04-04 LAB — T3, FREE: T3, Free: 3.4 pg/mL (ref 2.3–4.2)

## 2021-04-04 MED ORDER — TIRZEPATIDE 2.5 MG/0.5ML ~~LOC~~ SOAJ: 2.5000 mg | SUBCUTANEOUS | 5 refills | Status: DC

## 2021-04-04 MED ORDER — ESCITALOPRAM OXALATE 20 MG PO TABS: 20.0000 mg | ORAL_TABLET | Freq: Every day | ORAL | 2 refills | Status: DC

## 2021-04-04 NOTE — Progress Notes (Signed)
? ?  Subjective:  ? ? Patient ID: Nancy [name], female    DOB: July 31, 1955, 66 y.o.   MRN: [id number] ? ?HPI ?Here for several issues. First she has been dealing with depression for a few months. She thinks the Lexapro is not helping as much as it used to. Also she wants to check her BP. Lastly she asks about weight management. She has tried eating a healthy diet and exercising with little success. She has even tried taking Phentermine with no success.  ? ? ?Review of Systems  ?Constitutional:  Positive for fatigue.  ?Respiratory: Negative.    ?Cardiovascular: Negative.   ?Psychiatric/Behavioral:  Positive for dysphoric mood. Negative for agitation, behavioral problems, confusion, decreased concentration, hallucinations, self-injury, sleep disturbance and suicidal ideas. The patient is nervous/anxious.   ? ?   ?Objective:  ? Physical Exam ?Constitutional:   ?   General: She is not in acute distress. ?   Appearance: She is obese.  ?Cardiovascular:  ?   Rate and Rhythm: Normal rate and regular rhythm.  ?   Pulses: Normal pulses.  ?   Heart sounds: Normal heart sounds.  ?Pulmonary:  ?   Effort: Pulmonary effort is normal.  ?   Breath sounds: Normal breath sounds.  ?Lymphadenopathy:  ?   Cervical: No cervical adenopathy.  ?Neurological:  ?   General: No focal deficit present.  ?   Mental Status: She is alert and oriented to person, place, and time.  ?Psychiatric:     ?   Behavior: Behavior normal.     ?   Thought Content: Thought content normal.  ?   Comments: Affect is mildly depressed   ? ? ? ? ? ?   ?Assessment & Plan:  ?Her HTN is well controlled. For the depression, we will increase the Lexapro to 20 mg daily. For aid with weight loss, she will try Mounjaro 2.5 mg injections weekly for 4 weeks. She will follow up in 4 weeks. We will get labs today including a BMET and an A1c. We spent a total of ( 31  ) minutes reviewing records and discussing these issues.  ?Alysia Penna, MD ? ? ?

## 2021-04-09 ENCOUNTER — Other Ambulatory Visit: Payer: Self-pay | Admitting: Family Medicine

## 2021-04-10 ENCOUNTER — Encounter: Payer: Self-pay | Admitting: Family Medicine

## 2021-04-11 NOTE — Telephone Encounter (Signed)
Ask her to speak to her insurance company to find out what diabetic medications they DO cover, and then I can pick one for her to try  ?

## 2021-04-13 NOTE — Telephone Encounter (Signed)
Please find out how the pharmacy wants me to order this, and then I will (what med, what dosage, etc)  ?

## 2021-04-13 NOTE — Telephone Encounter (Signed)
Spoke with an agent from [employer], state that they do not supply medications to clinics/practices that do not have an account with them. Attempted to call pt with no success, will try again later ?

## 2021-04-26 ENCOUNTER — Telehealth: Payer: Self-pay | Admitting: Family Medicine

## 2021-04-26 ENCOUNTER — Encounter: Payer: Self-pay | Admitting: Family Medicine

## 2021-04-26 NOTE — Telephone Encounter (Signed)
Patient is calling to speak with someone. Patient states that the call is complicated and the nurse would understand. ? ?Please advise. ?

## 2021-04-30 ENCOUNTER — Encounter: Payer: Self-pay | Admitting: Family Medicine

## 2021-04-30 NOTE — Telephone Encounter (Signed)
Please call this pharmacy and ask how they want the RX (faxed or phone). If phoned, give them Mounjaro 2.5 mg weekly for 4 weeks. We can go up from there  ?

## 2021-05-01 NOTE — Telephone Encounter (Signed)
Pt is calling back and has sent mychart message concerning mounjaro and would like callback ?

## 2021-05-02 ENCOUNTER — Telehealth: Payer: Self-pay | Admitting: Family Medicine

## 2021-05-02 MED ORDER — SEMAGLUTIDE(0.25 OR 0.5MG/DOS) 2 MG/1.5ML ~~LOC~~ SOPN: 2.5000 mg | PEN_INJECTOR | SUBCUTANEOUS | 1 refills | Status: DC

## 2021-05-02 NOTE — Telephone Encounter (Signed)
Message complete.   See My Chart message from 04/26/2021. ?

## 2021-05-02 NOTE — Telephone Encounter (Signed)
She should be injecting 2.5 mg of Semaglutide a week. Please see what their questions are about  ?

## 2021-05-02 NOTE — Telephone Encounter (Signed)
Done

## 2021-05-02 NOTE — Telephone Encounter (Signed)
Nancy [name] is calling and they have semaglutide 2.5 mg or pyridoxine 10 mg per 1 ml oty needs to say 2 sarah need clarification of dosage .please call sarah at [phone]  ?[facility] [place], [place] - [address] Hansel Starling Phone:  [phone]  ?Fax:  [phone]  ?  ? ?

## 2021-05-03 ENCOUNTER — Other Ambulatory Visit: Payer: Self-pay | Admitting: Family Medicine

## 2021-05-03 ENCOUNTER — Telehealth: Payer: Self-pay | Admitting: Family Medicine

## 2021-05-03 ENCOUNTER — Other Ambulatory Visit: Payer: Self-pay | Admitting: Internal Medicine

## 2021-05-03 DIAGNOSIS — E039 Hypothyroidism, unspecified: Secondary | ICD-10-CM

## 2021-05-03 NOTE — Telephone Encounter (Signed)
FYI only.

## 2021-05-03 NOTE — Telephone Encounter (Signed)
Pharmacist [name] wrpte starter dose protocol 0.3 mgs (12 units) once weekly sub-que for the first 2 weeks.  Increase by 0.3 mgs each week up to 1.25 mgs (50 units).  Just fyi for Dr. Sarajane Jews. ?

## 2021-05-03 NOTE — Telephone Encounter (Signed)
Handwritten Rx was faxed to [facility] at [phone] attn: [name].   ?

## 2021-05-03 NOTE — Telephone Encounter (Signed)
Please fax the RX I wrote to her pharmacy  ?

## 2021-05-03 NOTE — Telephone Encounter (Signed)
Spoke with Judson Roch, the pharmacist and she stated in order for the patient to receive the prescription from their pharmacy the Rx should be written as below: ?Semaglutide 2.53m+Pyridoxine 167mper ml--sig: 2.51m17meekly--quantity: 2ml54mr vial.   ? ?Message sent to PCP. ?

## 2021-05-08 ENCOUNTER — Other Ambulatory Visit: Payer: Self-pay

## 2021-05-08 MED ORDER — FUROSEMIDE 20 MG PO TABS: 20.0000 mg | ORAL_TABLET | Freq: Every day | ORAL | 3 refills | Status: DC

## 2021-05-09 ENCOUNTER — Telehealth: Payer: Self-pay | Admitting: Family Medicine

## 2021-05-09 ENCOUNTER — Other Ambulatory Visit: Payer: Self-pay

## 2021-05-09 MED ORDER — ALBUTEROL SULFATE HFA 108 (90 BASE) MCG/ACT IN AERS: 2.0000 | INHALATION_SPRAY | RESPIRATORY_TRACT | 5 refills | Status: DC | PRN

## 2021-05-09 MED ORDER — FUROSEMIDE 20 MG PO TABS: 20.0000 mg | ORAL_TABLET | Freq: Every day | ORAL | 3 refills | Status: DC

## 2021-05-09 NOTE — Telephone Encounter (Signed)
Refills sent  to pt pharmacy as requested ?

## 2021-05-09 NOTE — Telephone Encounter (Signed)
Patient called because she needs a refill on  furosemide (LASIX) 20 MG tablet and albuterol (VENTOLIN HFA) 108 (90 Base) MCG/ACT inhaler ? ? ?Patient states that the Lasix was sent to the wrong pharmacy and only one prescription needs to go to the one in [facility]  ? ?Please send to ? ?[facility], [place] to [address] Phone:  [phone]  ?Fax:  [phone]  ?  ? ? ? ? ?Please advise  ?

## 2021-06-03 DIAGNOSIS — Z0279 Encounter for issue of other medical certificate: Secondary | ICD-10-CM

## 2021-06-04 ENCOUNTER — Other Ambulatory Visit: Payer: Self-pay

## 2021-06-04 ENCOUNTER — Emergency Department (HOSPITAL_BASED_OUTPATIENT_CLINIC_OR_DEPARTMENT_OTHER): Payer: 59

## 2021-06-04 ENCOUNTER — Encounter (HOSPITAL_BASED_OUTPATIENT_CLINIC_OR_DEPARTMENT_OTHER): Payer: Self-pay | Admitting: Emergency Medicine

## 2021-06-04 ENCOUNTER — Inpatient Hospital Stay (HOSPITAL_BASED_OUTPATIENT_CLINIC_OR_DEPARTMENT_OTHER)
Admission: EM | Admit: 2021-06-04 | Discharge: 2021-06-06 | DRG: 291 | Disposition: A | Discharge status: Home / Self Care | Payer: 59 | Attending: Internal Medicine | Admitting: Internal Medicine

## 2021-06-04 DIAGNOSIS — K219 Gastro-esophageal reflux disease without esophagitis: Secondary | ICD-10-CM | POA: Diagnosis present

## 2021-06-04 DIAGNOSIS — J449 Chronic obstructive pulmonary disease, unspecified: Secondary | ICD-10-CM | POA: Diagnosis present

## 2021-06-04 DIAGNOSIS — J9601 Acute respiratory failure with hypoxia: Secondary | ICD-10-CM | POA: Diagnosis present

## 2021-06-04 DIAGNOSIS — Z7951 Long term (current) use of inhaled steroids: Secondary | ICD-10-CM | POA: Diagnosis not present

## 2021-06-04 DIAGNOSIS — Z800 Family history of malignant neoplasm of digestive organs: Secondary | ICD-10-CM

## 2021-06-04 DIAGNOSIS — Z87891 Personal history of nicotine dependence: Secondary | ICD-10-CM

## 2021-06-04 DIAGNOSIS — N1831 Chronic kidney disease, stage 3a: Secondary | ICD-10-CM | POA: Diagnosis not present

## 2021-06-04 DIAGNOSIS — E7800 Pure hypercholesterolemia, unspecified: Secondary | ICD-10-CM | POA: Diagnosis present

## 2021-06-04 DIAGNOSIS — Z6841 Body Mass Index (BMI) 40.0 and over, adult: Secondary | ICD-10-CM | POA: Diagnosis not present

## 2021-06-04 DIAGNOSIS — E039 Hypothyroidism, unspecified: Secondary | ICD-10-CM | POA: Diagnosis present

## 2021-06-04 DIAGNOSIS — I5032 Chronic diastolic (congestive) heart failure: Secondary | ICD-10-CM | POA: Diagnosis present

## 2021-06-04 DIAGNOSIS — Z8249 Family history of ischemic heart disease and other diseases of the circulatory system: Secondary | ICD-10-CM

## 2021-06-04 DIAGNOSIS — N179 Acute kidney failure, unspecified: Secondary | ICD-10-CM | POA: Diagnosis present

## 2021-06-04 DIAGNOSIS — J811 Chronic pulmonary edema: Secondary | ICD-10-CM | POA: Diagnosis not present

## 2021-06-04 DIAGNOSIS — E8881 Metabolic syndrome: Secondary | ICD-10-CM | POA: Diagnosis not present

## 2021-06-04 DIAGNOSIS — R109 Unspecified abdominal pain: Secondary | ICD-10-CM | POA: Diagnosis not present

## 2021-06-04 DIAGNOSIS — I10 Essential (primary) hypertension: Secondary | ICD-10-CM | POA: Diagnosis not present

## 2021-06-04 DIAGNOSIS — Z882 Allergy status to sulfonamides status: Secondary | ICD-10-CM

## 2021-06-04 DIAGNOSIS — E1122 Type 2 diabetes mellitus with diabetic chronic kidney disease: Secondary | ICD-10-CM | POA: Diagnosis present

## 2021-06-04 DIAGNOSIS — I252 Old myocardial infarction: Secondary | ICD-10-CM

## 2021-06-04 DIAGNOSIS — I272 Pulmonary hypertension, unspecified: Secondary | ICD-10-CM | POA: Diagnosis not present

## 2021-06-04 DIAGNOSIS — I700 Atherosclerosis of aorta: Secondary | ICD-10-CM | POA: Diagnosis present

## 2021-06-04 DIAGNOSIS — I2510 Atherosclerotic heart disease of native coronary artery without angina pectoris: Secondary | ICD-10-CM | POA: Diagnosis not present

## 2021-06-04 DIAGNOSIS — E890 Postprocedural hypothyroidism: Secondary | ICD-10-CM | POA: Diagnosis not present

## 2021-06-04 DIAGNOSIS — K760 Fatty (change of) liver, not elsewhere classified: Secondary | ICD-10-CM | POA: Diagnosis present

## 2021-06-04 DIAGNOSIS — D751 Secondary polycythemia: Secondary | ICD-10-CM | POA: Diagnosis present

## 2021-06-04 DIAGNOSIS — Z79890 Hormone replacement therapy (postmenopausal): Secondary | ICD-10-CM | POA: Diagnosis not present

## 2021-06-04 DIAGNOSIS — Z825 Family history of asthma and other chronic lower respiratory diseases: Secondary | ICD-10-CM

## 2021-06-04 DIAGNOSIS — Z7982 Long term (current) use of aspirin: Secondary | ICD-10-CM

## 2021-06-04 DIAGNOSIS — K5790 Diverticulosis of intestine, part unspecified, without perforation or abscess without bleeding: Secondary | ICD-10-CM | POA: Diagnosis not present

## 2021-06-04 DIAGNOSIS — G43909 Migraine, unspecified, not intractable, without status migrainosus: Secondary | ICD-10-CM | POA: Diagnosis not present

## 2021-06-04 DIAGNOSIS — R0609 Other forms of dyspnea: Secondary | ICD-10-CM | POA: Diagnosis not present

## 2021-06-04 DIAGNOSIS — Z79899 Other long term (current) drug therapy: Secondary | ICD-10-CM | POA: Diagnosis not present

## 2021-06-04 DIAGNOSIS — Z20822 Contact with and (suspected) exposure to covid-19: Secondary | ICD-10-CM | POA: Diagnosis present

## 2021-06-04 DIAGNOSIS — Z803 Family history of malignant neoplasm of breast: Secondary | ICD-10-CM

## 2021-06-04 DIAGNOSIS — N183 Chronic kidney disease, stage 3 unspecified: Secondary | ICD-10-CM | POA: Diagnosis present

## 2021-06-04 DIAGNOSIS — R519 Headache, unspecified: Secondary | ICD-10-CM | POA: Diagnosis not present

## 2021-06-04 DIAGNOSIS — I130 Hypertensive heart and chronic kidney disease with heart failure and stage 1 through stage 4 chronic kidney disease, or unspecified chronic kidney disease: Principal | ICD-10-CM | POA: Diagnosis present

## 2021-06-04 DIAGNOSIS — M545 Low back pain, unspecified: Secondary | ICD-10-CM | POA: Diagnosis present

## 2021-06-04 DIAGNOSIS — I5033 Acute on chronic diastolic (congestive) heart failure: Secondary | ICD-10-CM | POA: Diagnosis not present

## 2021-06-04 DIAGNOSIS — G8929 Other chronic pain: Secondary | ICD-10-CM | POA: Diagnosis present

## 2021-06-04 DIAGNOSIS — R0902 Hypoxemia: Secondary | ICD-10-CM | POA: Diagnosis not present

## 2021-06-04 DIAGNOSIS — E669 Obesity, unspecified: Secondary | ICD-10-CM | POA: Diagnosis present

## 2021-06-04 DIAGNOSIS — R079 Chest pain, unspecified: Secondary | ICD-10-CM | POA: Diagnosis not present

## 2021-06-04 LAB — GLUCOSE, CAPILLARY
Glucose-Capillary: 91 mg/dL (ref 70–99)
Glucose-Capillary: 113 mg/dL — ABNORMAL HIGH (ref 70–99)

## 2021-06-04 LAB — URINALYSIS, ROUTINE W REFLEX MICROSCOPIC
Bilirubin Urine: NEGATIVE
Glucose, UA: NEGATIVE mg/dL
Hgb urine dipstick: NEGATIVE
Ketones, ur: NEGATIVE mg/dL
Leukocytes,Ua: NEGATIVE
Nitrite: NEGATIVE
Protein, ur: 30 mg/dL — AB
Specific Gravity, Urine: 1.031 — ABNORMAL HIGH (ref 1.005–1.030)
pH: 5.5 (ref 5.0–8.0)

## 2021-06-04 LAB — COMPREHENSIVE METABOLIC PANEL
ALT: 26 U/L (ref 0–44)
AST: 18 U/L (ref 15–41)
Albumin: 4 g/dL (ref 3.5–5.0)
Alkaline Phosphatase: 63 U/L (ref 38–126)
Anion gap: 8 (ref 5–15)
BUN: 25 mg/dL — ABNORMAL HIGH (ref 8–23)
CO2: 33 mmol/L — ABNORMAL HIGH (ref 22–32)
Calcium: 9 mg/dL (ref 8.9–10.3)
Chloride: 96 mmol/L — ABNORMAL LOW (ref 98–111)
Creatinine, Ser: 1.51 mg/dL — ABNORMAL HIGH (ref 0.44–1.00)
GFR, Estimated: 39 mL/min — ABNORMAL LOW (ref >60)
Glucose, Bld: 127 mg/dL — ABNORMAL HIGH (ref 70–99)
Potassium: 4.7 mmol/L (ref 3.5–5.1)
Sodium: 137 mmol/L (ref 135–145)
Total Bilirubin: 0.7 mg/dL (ref 0.3–1.2)
Total Protein: 6.8 g/dL (ref 6.5–8.1)

## 2021-06-04 LAB — RESP PANEL BY RT-PCR (FLU A&B, COVID) ARPGX2
Influenza A by PCR: NEGATIVE
Influenza B by PCR: NEGATIVE
SARS Coronavirus 2 by RT PCR: NEGATIVE

## 2021-06-04 LAB — CBC
HCT: 49.4 % — ABNORMAL HIGH (ref 36.0–46.0)
Hemoglobin: 15.8 g/dL — ABNORMAL HIGH (ref 12.0–15.0)
MCH: 29.5 pg (ref 26.0–34.0)
MCHC: 32 g/dL (ref 30.0–36.0)
MCV: 92.2 fL (ref 80.0–100.0)
Platelets: 161 10*3/uL (ref 150–400)
RBC: 5.36 MIL/uL — ABNORMAL HIGH (ref 3.87–5.11)
RDW: 15.3 % (ref 11.5–15.5)
WBC: 7.9 10*3/uL (ref 4.0–10.5)
nRBC: 0.4 % — ABNORMAL HIGH (ref 0.0–0.2)

## 2021-06-04 LAB — HEMOGLOBIN A1C
Hgb A1c MFr Bld: 6.8 % — ABNORMAL HIGH (ref 4.8–5.6)
Mean Plasma Glucose: 148.46 mg/dL

## 2021-06-04 LAB — D-DIMER, QUANTITATIVE: D-Dimer, Quant: 0.27 ug/mL-FEU (ref 0.00–0.50)

## 2021-06-04 LAB — BRAIN NATRIURETIC PEPTIDE: B Natriuretic Peptide: 232.3 pg/mL — ABNORMAL HIGH (ref 0.0–100.0)

## 2021-06-04 LAB — HIV ANTIBODY (ROUTINE TESTING W REFLEX): HIV Screen 4th Generation wRfx: NONREACTIVE

## 2021-06-04 LAB — MAGNESIUM: Magnesium: 1.8 mg/dL (ref 1.7–2.4)

## 2021-06-04 MED ORDER — ONDANSETRON HCL 4 MG PO TABS
4.0000 mg | ORAL_TABLET | Freq: Four times a day (QID) | ORAL | Status: DC | PRN
Start: 2021-06-04 — End: 2021-06-06

## 2021-06-04 MED ORDER — PANTOPRAZOLE SODIUM 40 MG PO TBEC
40.0000 mg | DELAYED_RELEASE_TABLET | Freq: Every day | ORAL | Status: DC
  Administered 2021-06-04 – 2021-06-06 (×3): 40 mg via ORAL
  Filled 2021-06-04 (×3): qty 1

## 2021-06-04 MED ORDER — TEMAZEPAM 15 MG PO CAPS: 30.0000 mg | ORAL_CAPSULE | Freq: Every evening | ORAL | Status: DC | PRN

## 2021-06-04 MED ORDER — SPIRONOLACTONE 25 MG PO TABS
25.0000 mg | ORAL_TABLET | Freq: Every day | ORAL | Status: DC
  Administered 2021-06-04 – 2021-06-06 (×3): 25 mg via ORAL
  Filled 2021-06-04 (×3): qty 1

## 2021-06-04 MED ORDER — SODIUM CHLORIDE 0.9% FLUSH: 3.0000 mL | INTRAVENOUS | Status: DC | PRN

## 2021-06-04 MED ORDER — METOPROLOL TARTRATE 25 MG PO TABS
25.0000 mg | ORAL_TABLET | Freq: Two times a day (BID) | ORAL | Status: DC
  Administered 2021-06-04 – 2021-06-06 (×4): 25 mg via ORAL
  Filled 2021-06-04 (×4): qty 1

## 2021-06-04 MED ORDER — INSULIN ASPART 100 UNIT/ML IJ SOLN: 0.0000 [IU] | Freq: Three times a day (TID) | INTRAMUSCULAR | Status: DC

## 2021-06-04 MED ORDER — DIPHENHYDRAMINE HCL 25 MG PO CAPS
25.0000 mg | ORAL_CAPSULE | Freq: Every day | ORAL | Status: DC
Start: 2021-06-04 — End: 2021-06-06
  Administered 2021-06-04 – 2021-06-05 (×2): 25 mg via ORAL
  Filled 2021-06-04 (×2): qty 1

## 2021-06-04 MED ORDER — SODIUM CHLORIDE 0.9% FLUSH
3.0000 mL | Freq: Two times a day (BID) | INTRAVENOUS | Status: DC
  Administered 2021-06-04 – 2021-06-05 (×3): 3 mL via INTRAVENOUS

## 2021-06-04 MED ORDER — CALCIUM CITRATE 950 (200 CA) MG PO TABS
200.0000 mg | ORAL_TABLET | Freq: Three times a day (TID) | ORAL | Status: DC
  Administered 2021-06-04 – 2021-06-06 (×5): 200 mg via ORAL
  Filled 2021-06-04 (×6): qty 1

## 2021-06-04 MED ORDER — FUROSEMIDE 10 MG/ML IJ SOLN
80.0000 mg | Freq: Two times a day (BID) | INTRAMUSCULAR | Status: DC
  Administered 2021-06-04 – 2021-06-06 (×4): 80 mg via INTRAVENOUS
  Filled 2021-06-04 (×4): qty 8

## 2021-06-04 MED ORDER — LACTATED RINGERS IV BOLUS
1000.0000 mL | Freq: Once | INTRAVENOUS | Status: AC
  Administered 2021-06-04: 1000 mL via INTRAVENOUS

## 2021-06-04 MED ORDER — INSULIN ASPART 100 UNIT/ML IJ SOLN: 0.0000 [IU] | Freq: Every day | INTRAMUSCULAR | Status: DC

## 2021-06-04 MED ORDER — ESCITALOPRAM OXALATE 20 MG PO TABS
20.0000 mg | ORAL_TABLET | Freq: Every day | ORAL | Status: DC
Start: 2021-06-04 — End: 2021-06-06
  Administered 2021-06-04 – 2021-06-06 (×3): 20 mg via ORAL
  Filled 2021-06-04 (×3): qty 1

## 2021-06-04 MED ORDER — LEVOTHYROXINE SODIUM 100 MCG PO TABS
200.0000 ug | ORAL_TABLET | Freq: Every day | ORAL | Status: DC
  Administered 2021-06-05 – 2021-06-06 (×2): 200 ug via ORAL
  Filled 2021-06-04 (×2): qty 2

## 2021-06-04 MED ORDER — ACETAMINOPHEN 500 MG PO TABS
500.0000 mg | ORAL_TABLET | Freq: Every day | ORAL | Status: DC
  Administered 2021-06-04 – 2021-06-05 (×2): 500 mg via ORAL
  Filled 2021-06-04 (×2): qty 1

## 2021-06-04 MED ORDER — DIPHENHYDRAMINE-APAP (SLEEP) 25-500 MG PO TABS: 1.0000 | ORAL_TABLET | Freq: Every day | ORAL | Status: DC

## 2021-06-04 MED ORDER — IOHEXOL 350 MG/ML SOLN
100.0000 mL | Freq: Once | INTRAVENOUS | Status: AC | PRN
  Administered 2021-06-04: 75 mL via INTRAVENOUS

## 2021-06-04 MED ORDER — POLYETHYLENE GLYCOL 3350 17 G PO PACK
17.0000 g | PACK | Freq: Two times a day (BID) | ORAL | Status: AC
  Administered 2021-06-04: 17 g via ORAL
  Filled 2021-06-04 (×2): qty 1

## 2021-06-04 MED ORDER — CALCITRIOL 0.25 MCG PO CAPS
0.2500 ug | ORAL_CAPSULE | Freq: Two times a day (BID) | ORAL | Status: DC
  Administered 2021-06-04 – 2021-06-06 (×4): 0.25 ug via ORAL
  Filled 2021-06-04 (×4): qty 1

## 2021-06-04 MED ORDER — ATORVASTATIN CALCIUM 40 MG PO TABS
80.0000 mg | ORAL_TABLET | Freq: Every day | ORAL | Status: DC
  Administered 2021-06-04 – 2021-06-06 (×3): 80 mg via ORAL
  Filled 2021-06-04 (×3): qty 2

## 2021-06-04 MED ORDER — LEVOTHYROXINE SODIUM 100 MCG PO TABS: 200.0000 ug | ORAL_TABLET | Freq: Every day | ORAL | Status: DC

## 2021-06-04 MED ORDER — ASPIRIN EC 81 MG PO TBEC
81.0000 mg | DELAYED_RELEASE_TABLET | Freq: Every day | ORAL | Status: DC
  Administered 2021-06-04 – 2021-06-05 (×2): 81 mg via ORAL
  Filled 2021-06-04 (×2): qty 1

## 2021-06-04 MED ORDER — ENOXAPARIN SODIUM 80 MG/0.8ML IJ SOSY
80.0000 mg | PREFILLED_SYRINGE | Freq: Every day | INTRAMUSCULAR | Status: DC
  Administered 2021-06-04 – 2021-06-06 (×3): 80 mg via SUBCUTANEOUS
  Filled 2021-06-04 (×3): qty 0.8

## 2021-06-04 MED ORDER — SODIUM CHLORIDE 0.9 % IV SOLN: 250.0000 mL | INTRAVENOUS | Status: DC | PRN

## 2021-06-04 MED ORDER — KETOROLAC TROMETHAMINE 30 MG/ML IJ SOLN
15.0000 mg | Freq: Once | INTRAMUSCULAR | Status: AC
  Administered 2021-06-04: 15 mg via INTRAVENOUS
  Filled 2021-06-04: qty 1

## 2021-06-04 MED ORDER — OXYCODONE HCL 5 MG PO TABS
5.0000 mg | ORAL_TABLET | ORAL | Status: DC | PRN
  Administered 2021-06-04 – 2021-06-05 (×2): 5 mg via ORAL
  Filled 2021-06-04 (×2): qty 1

## 2021-06-04 MED ORDER — DIPHENHYDRAMINE HCL 50 MG/ML IJ SOLN
25.0000 mg | Freq: Once | INTRAMUSCULAR | Status: AC
  Administered 2021-06-04: 25 mg via INTRAVENOUS
  Filled 2021-06-04: qty 1

## 2021-06-04 MED ORDER — MOMETASONE FURO-FORMOTEROL FUM 200-5 MCG/ACT IN AERO: 2.0000 | INHALATION_SPRAY | Freq: Two times a day (BID) | RESPIRATORY_TRACT | Status: DC

## 2021-06-04 MED ORDER — ACETAMINOPHEN 650 MG RE SUPP: 650.0000 mg | Freq: Four times a day (QID) | RECTAL | Status: DC | PRN

## 2021-06-04 MED ORDER — ONDANSETRON HCL 4 MG/2ML IJ SOLN: 4.0000 mg | Freq: Four times a day (QID) | INTRAMUSCULAR | Status: DC | PRN

## 2021-06-04 MED ORDER — ACETAMINOPHEN 325 MG PO TABS
650.0000 mg | ORAL_TABLET | Freq: Four times a day (QID) | ORAL | Status: DC | PRN
  Administered 2021-06-05: 650 mg via ORAL
  Filled 2021-06-04: qty 2

## 2021-06-04 MED ORDER — MAGNESIUM OXIDE -MG SUPPLEMENT 400 (240 MG) MG PO TABS
400.0000 mg | ORAL_TABLET | Freq: Every day | ORAL | Status: DC
  Administered 2021-06-04 – 2021-06-06 (×3): 400 mg via ORAL
  Filled 2021-06-04 (×3): qty 1

## 2021-06-04 MED ORDER — PROCHLORPERAZINE EDISYLATE 10 MG/2ML IJ SOLN
10.0000 mg | Freq: Once | INTRAMUSCULAR | Status: AC
  Administered 2021-06-04: 10 mg via INTRAVENOUS
  Filled 2021-06-04: qty 2

## 2021-06-04 MED ORDER — INSULIN ASPART 100 UNIT/ML IJ SOLN
3.0000 [IU] | Freq: Three times a day (TID) | INTRAMUSCULAR | Status: DC
  Administered 2021-06-05 – 2021-06-06 (×4): 3 [IU] via SUBCUTANEOUS

## 2021-06-04 NOTE — Progress Notes (Signed)
RT note: Nasal swab obtained/labelled/given to lab. ?

## 2021-06-04 NOTE — ED Notes (Signed)
Pt room air O2 saturation at 78%. Pt placed on 2L [place]. She denies SOB.  ?

## 2021-06-04 NOTE — ED Notes (Signed)
RT Note: DR. Kathrynn Humble, MD covering [facility] made aware of ABG refusal and DR. Alroy Dust, MD being notified via [facility], RT to follow. ?

## 2021-06-04 NOTE — ED Triage Notes (Signed)
Pt arrives to ED with c/o flank pain. She reports she has bilateral flank pain that started yesterday, left greater than right. She also reports headache x3 days with associated blurry vision for multiple weeks. Associated symptoms include nausea. No urinary frequency, urgency, hematuria.  ?

## 2021-06-04 NOTE — ED Notes (Signed)
RT note: Pt. refused Arterial blood gas, Dr. Olevia Bowens paged to see if Venous blood gas would be acceptable. RT to monitor. ?

## 2021-06-04 NOTE — ED Provider Notes (Signed)
?[facility] EMERG[facility] DEPT ?Provider Note ? ? ?CSN: [id number] ?Arrival date & time: 06/04/21  0810 ? ?  ? ?History ? ?Chief Complaint  ?Patient presents with  ? Flank Pain  ? ? ?Nancy [name] is a 66 y.o. female. ? ?HPI ? ?  ? ?66 year old female comes in with chief complaint of flank pain, headache, generalized weakness. ? ?Patient has history of CAD, metabolic syndrome, obesity. ? ?She indicates that she has been feeling unwell for the last 5 days.  She started having headaches that are intermittent, but nagging.  Soon after, she started having some subjective fevers with sweats and chills and yesterday she started having bilateral flank pain.  Patient denies any burning with urination, blood in the urine, urinary frequency.  She denies any diarrhea or bloody stools.  Headaches are frontal, behind her left eye.  She thinks there her vision is slightly blurry.  Headaches are new for her, she has migraines, but the current headaches are different than her normal headaches. ? ?Of note, patient was found to be hypoxic at arrival.  She denies any shortness of breath. ? ?Home Medications ?Prior to Admission medications   ?Medication Sig Start Date End Date Taking? Authorizing Provider  ?albuterol (VENTOLIN HFA) 108 (90 Base) MCG/ACT inhaler Inhale 2 puffs into the lungs every 4 (four) hours as needed for wheezing or shortness of breath. 05/09/21  Yes Laurey Morale, MD  ?aspirin 81 MG tablet Take 81 mg by mouth at bedtime.    Yes [provider]  ?atorvastatin (LIPITOR) 80 MG tablet TAKE 1 TABLET DAILY ?Patient taking differently: Take 80 mg by mouth daily. 05/03/21  Yes Laurey Morale, MD  ?calcitRIOL (ROCALTROL) 0.25 MCG capsule Take 1 capsule (0.25 mcg total) by mouth 2 (two) times daily. 12/06/20  Yes Laurey Morale, MD  ?calcium citrate (CALCITRATE) 950 (200 Ca) MG tablet Take 1 tablet (200 mg of elemental calcium total) by mouth 3 (three) times daily. 09/22/19  Yes Laurey Morale, MD  ?cetirizine  (ZYRTEC) 10 MG tablet Take 10 mg by mouth daily.   Yes [provider]  ?diphenhydramine-acetaminophen (TYLENOL PM) 25-500 MG TABS tablet Take 1 tablet by mouth at bedtime.   Yes [provider]  ?escitalopram (LEXAPRO) 20 MG tablet Take 1 tablet (20 mg total) by mouth daily. 04/04/21  Yes Laurey Morale, MD  ?furosemide (LASIX) 20 MG tablet Take 1 tablet (20 mg total) by mouth daily. 05/09/21  Yes Laurey Morale, MD  ?Magnesium Oxide 400 (240 Mg) MG TABS TAKE 1 TABLET BY MOUTH ONCE DAILY ?Patient taking differently: Take 400 mg by mouth daily. 11/26/19  Yes Laurey Morale, MD  ?metoprolol tartrate (LOPRESSOR) 25 MG tablet TAKE 1 TABLET TWICE A DAY 05/03/21  Yes Laurey Morale, MD  ?montelukast (SINGULAIR) 10 MG tablet TAKE 1 TABLET AT BEDTIME ?Patient taking differently: Take 10 mg by mouth at bedtime. 05/03/21  Yes Spero Geralds, MD  ?Multiple Vitamin (MULTIVITAMIN) tablet Take 1 tablet by mouth at bedtime.   Yes [provider]  ?Multiple Vitamins-Minerals (MULTIVITAMIN WITH MINERALS) tablet Take 1 tablet by mouth daily.   Yes [provider]  ?omeprazole (PRILOSEC) 40 MG capsule TAKE 1 CAPSULE TWICE DAILY ?Patient taking differently: Take 40 mg by mouth daily. 04/09/21  Yes Laurey Morale, MD  ?spironolactone (ALDACTONE) 25 MG tablet TAKE 1 TABLET DAILY ?Patient taking differently: Take 25 mg by mouth daily. 05/03/21  Yes Laurey Morale, MD  ?SYNTHROID 200  MCG tablet TAKE 1 TABLET DAILY ?Patient taking differently: Take 200 mcg by mouth daily before breakfast. 05/03/21  Yes Laurey Morale, MD  ?temazepam (RESTORIL) 30 MG capsule Take 1 capsule (30 mg total) by mouth at bedtime as needed for sleep. 12/26/20  Yes Laurey Morale, MD  ?albuterol (PROVENTIL) (2.5 MG/3ML) 0.083% nebulizer solution INHALE 3 MLS (ONE VIAL) BY NEBULIZATION EVERY 4 HOURS AS NEEDED FOR WHEEZING OR SHORTNESS OF BREATH. 09/21/19 12/06/20  Laurey Morale, MD  ?budesonide-formoterol [facility]) 160-4.5 MCG/ACT inhaler  Inhale 2 puffs into the lungs in the morning and at bedtime. ?Patient not taking: Reported on 06/04/2021 06/08/20   Spero Geralds, MD  ?Fluticasone-Salmeterol (ADVAIR DISKUS) 250-50 MCG/DOSE AEPB Inhale 1 puff into the lungs in the morning and at bedtime. 01/18/20 04/12/20  Spero Geralds, MD  ?   ? ?Allergies    ?Sulfa antibiotics and Sulfamethoxazole   ? ?Review of Systems   ?Review of Systems  ?All other systems reviewed and are negative. ? ?Physical Exam ?Updated Vital Signs ?BP 132/74 (BP Location: Right Arm)   Pulse 73   Temp (!) 97.4 ?F (36.3 ?C) (Oral)   Resp (!) 21   Ht 5' 7"  (1.702 m)   Wt (!) 154.2 kg   SpO2 93%   BMI 53.25 kg/m?  ?Physical Exam ?Vitals and nursing note reviewed.  ?Constitutional:   ?   Appearance: She is well-developed.  ?HENT:  ?   Head: Atraumatic.  ?Eyes:  ?   Extraocular Movements: Extraocular movements intact.  ?   Pupils: Pupils are equal, round, and reactive to light.  ?   Comments: No visual field defects, finger counting is normal  ?Cardiovascular:  ?   Rate and Rhythm: Normal rate.  ?Pulmonary:  ?   Effort: Pulmonary effort is normal.  ?Abdominal:  ?   Tenderness: There is no abdominal tenderness.  ?Musculoskeletal:     ?   General: No tenderness.  ?   Cervical back: Normal range of motion and neck supple.  ?Skin: ?   General: Skin is warm and dry.  ?Neurological:  ?   Mental Status: She is alert and oriented to person, place, and time.  ? ? ?ED Results / Procedures / Treatments   ?Labs ?(all labs ordered are listed, but only abnormal results are displayed) ?Labs Reviewed  ?COMPREHENSIVE METABOLIC PANEL - Abnormal; Notable for the following components:  ?    Result Value  ? Chloride 96 (*)   ? CO2 33 (*)   ? Glucose, Bld 127 (*)   ? BUN 25 (*)   ? Creatinine, Ser 1.51 (*)   ? GFR, Estimated 39 (*)   ? All other components within normal limits  ?CBC - Abnormal; Notable for the following components:  ? RBC 5.36 (*)   ? Hemoglobin 15.8 (*)   ? HCT 49.4 (*)   ? nRBC 0.4 (*)    ? All other components within normal limits  ?URINALYSIS, ROUTINE W REFLEX MICROSCOPIC - Abnormal; Notable for the following components:  ? Specific Gravity, Urine 1.031 (*)   ? Protein, ur 30 (*)   ? All other components within normal limits  ?BRAIN NATRIURETIC PEPTIDE - Abnormal; Notable for the following components:  ? B Natriuretic Peptide 232.3 (*)   ? All other components within normal limits  ?BASIC METABOLIC PANEL - Abnormal; Notable for the following components:  ? Chloride 97 (*)   ? CO2 34 (*)   ? Glucose, Bld  100 (*)   ? Creatinine, Ser 1.44 (*)   ? Calcium 8.3 (*)   ? GFR, Estimated 41 (*)   ? All other components within normal limits  ?HEMOGLOBIN A1C - Abnormal; Notable for the following components:  ? Hgb A1c MFr Bld 6.8 (*)   ? All other components within normal limits  ?CBC - Abnormal; Notable for the following components:  ? Hemoglobin 15.5 (*)   ? HCT 47.6 (*)   ? All other components within normal limits  ?GLUCOSE, CAPILLARY - Abnormal; Notable for the following components:  ? Glucose-Capillary 113 (*)   ? All other components within normal limits  ?RESP PANEL BY RT-PCR (FLU A&B, COVID) [id number]  ?D-DIMER, QUANTITATIVE  ?HIV ANTIBODY (ROUTINE TESTING W REFLEX)  ?MAGNESIUM  ?GLUCOSE, CAPILLARY  ?CALCIUM, URINE, RANDOM  ? ? ?EKG ?EKG Interpretation ? ?Date/Time:  Monday Jun 04 2021 12:54:10 EDT ?Ventricular Rate:  64 ?PR Interval:  187 ?QRS Duration: 92 ?QT Interval:  432 ?QTC Calculation: 446 ?R Axis:   -62 ?Text Interpretation: Sinus rhythm Left anterior fascicular block Anterior infarct, old No acute changes Confirmed by Varney Biles [phone]) on 06/04/2021 12:59:36 PM ? ?Radiology ?CT Head Wo Contrast ? ?Result Date: 06/04/2021 ?CLINICAL DATA:  Headache for 3 days. EXAM: CT HEAD WITHOUT CONTRAST TECHNIQUE: Contiguous axial images were obtained from the base of the skull through the vertex without intravenous contrast. RADIATION DOSE REDUCTION: This exam was performed according to the departmental  dose-optimization program which includes automated exposure control, adjustment of the mA and/or kV according to patient size and/or use of iterative reconstruction technique. COMPARISON:  None Available. FIND

## 2021-06-04 NOTE — H&P (Signed)
? ? ?History and Physical ? ?Nancy [name] MWU:[id number] DOB: November 01, 1955 DOA: 06/04/2021 ? ?PCP: Laurey Morale, MD ?Patient coming from: Home ? ?I have personally briefly reviewed patient's old medical records in [place] ? ? ?Chief Complaint: SOB and back pain ? ?HPI: Nancy [name] is a 66 y.o. female past medical history of CAD, metabolic syndrome, hypothyroidism hyperlipidemia low back pain chronic kidney disease stage III, chronic diastolic heart failure, by echo on 2019 with an EF of 50% and grade 1 diastolic heart failure, on diabetes mellitus with a last hemoglobin A1c of 6.5, severe morbid obesity comes into the emergency room complaining of bilateral flank pain left greater than right associated with nausea but no urinary symptoms she relates has been having a headache for the past 3 days in the ED was found to be hypoxic in the 70s when she arrived, her BNP was mildly elevated chest x-ray shows cardiomegaly with mild pulmonary.  She denies any chest pain some dyspnea on exertion and orthopnea. ? ?In the ED: ?She was found to be hypoxic CT angio of the chest showed no PE but it did showed ectasis of the main pulmonary artery and pulmonary edema.  Creatinine of 1.5 BNP of 232 hemoglobin of 16 SARS-CoV-2 and influenza PCR were negative UA showed no signs of infection. ? ? ? ? ?Review of Systems: All systems reviewed and apart from history of presenting illness, are negative. ? ?Past Medical History:  ?Diagnosis Date  ? Allergic rhinitis   ? gets shots per Dr. Harold Hedge   ? Anemia 2001  ? Anginal pain ([place])   ? Asthma   ? COPD (chronic obstructive pulmonary disease) ([place])   ? "CXR just showed mild COPD" (10/24/2011)  ? Coronary artery disease   ? had MI in 2001, see[facility] Dr. Fletcher Anon at [facility]  ? GERD (gastroesophageal reflux disease)   ? Gynecological examination   ? sees Dr. Cherylann Banas   ? History of cardiac catheterization 07-24-07  ? showed nonconclusive disease  ? Hyperlipidemia   ?  Hypertension   ? Hypothyroidism   ? Migraines   ? "have a history of migraines; haven't had one for years" (10/24/2011)  ? Myocardial infarction [facility]) 2001?  ? in her 20's  ? ?Past Surgical History:  ?Procedure Laterality Date  ? CARDIAC CATHETERIZATION  2009  ? [place]  ? COLONOSCOPY  10-06-08  ? per Dr. Henrene Pastor, benign polyps, repeat in 10 yrs   ? ESOPHAGOGASTRODUODENOSCOPY  05/29/2017  ? per Dr. Henrene Pastor, normal except slight gastritis   ? HERNIA REPAIR  ~ 2007  ? ventral hernia repair  ? THYROIDECTOMY  1990's  ? TONSILLECTOMY    ? "when I was a kid"  ? ?Social History:  reports that she quit smoking about 14 years ago. Her smoking use included cigarettes. She has a 30.00 pack-year smoking history. She has never used smokeless tobacco. She reports that she does not drink alcohol and does not use drugs. ? ? ?Allergies  ?Allergen Reactions  ? Sulfa Antibiotics Hives  ? Sulfamethoxazole Hives  ? ? ?Family History  ?Problem Relation Age of Onset  ? Pulmonary fibrosis Mother   ? COPD Mother   ? Breast cancer Mother   ?     41's  ? Coronary artery disease Father   ? Hypertension Father   ? Cancer Other   ?     breast,colon,prostate  ? Alcohol abuse Other   ? Hyperlipidemia Other   ?  Hypertension Other   ? Stroke Other   ? Heart disease Other   ? COPD Other   ? Diabetes Other   ? Colon cancer Paternal Grandfather   ? Stomach cancer Other   ? Esophageal cancer Neg Hx   ? Rectal cancer Neg Hx   ? ? ?Prior to Admission medications   ?Medication Sig Start Date End Date Taking? Authorizing Provider  ?albuterol (PROVENTIL) (2.5 MG/3ML) 0.083% nebulizer solution INHALE 3 MLS (ONE VIAL) BY NEBULIZATION EVERY 4 HOURS AS NEEDED FOR WHEEZING OR SHORTNESS OF BREATH. 09/21/19 12/06/20  Laurey Morale, MD  ?albuterol (VENTOLIN HFA) 108 (90 Base) MCG/ACT inhaler Inhale 2 puffs into the lungs every 4 (four) hours as needed for wheezing or shortness of breath. 05/09/21   Laurey Morale, MD  ?aspirin 81 MG tablet Take 81 mg by mouth  at bedtime.     [provider]  ?atorvastatin (LIPITOR) 80 MG tablet TAKE 1 TABLET DAILY 05/03/21   Laurey Morale, MD  ?budesonide-formoterol [facility]) 160-4.5 MCG/ACT inhaler Inhale 2 puffs into the lungs in the morning and at bedtime. 06/08/20   Spero Geralds, MD  ?calcitRIOL (ROCALTROL) 0.25 MCG capsule Take 1 capsule (0.25 mcg total) by mouth 2 (two) times daily. 12/06/20   Laurey Morale, MD  ?calcium citrate (CALCITRATE) 950 (200 Ca) MG tablet Take 1 tablet (200 mg of elemental calcium total) by mouth 3 (three) times daily. 09/22/19   Laurey Morale, MD  ?cetirizine (ZYRTEC) 10 MG tablet Take 10 mg by mouth daily.    [provider]  ?diphenhydramine-acetaminophen (TYLENOL PM) 25-500 MG TABS tablet Take 1 tablet by mouth at bedtime.    [provider]  ?escitalopram (LEXAPRO) 20 MG tablet Take 1 tablet (20 mg total) by mouth daily. 04/04/21   Laurey Morale, MD  ?furosemide (LASIX) 20 MG tablet Take 1 tablet (20 mg total) by mouth daily. 05/09/21   Laurey Morale, MD  ?KRILL OIL PO Take 1,000 mg by mouth at bedtime.     [provider]  ?Magnesium Oxide 400 (240 Mg) MG TABS TAKE 1 TABLET BY MOUTH ONCE DAILY 11/26/19   Laurey Morale, MD  ?metoprolol tartrate (LOPRESSOR) 25 MG tablet TAKE 1 TABLET TWICE A DAY 05/03/21   Laurey Morale, MD  ?montelukast (SINGULAIR) 10 MG tablet TAKE 1 TABLET AT BEDTIME 05/03/21   Spero Geralds, MD  ?Multiple Vitamin (MULTIVITAMIN) tablet Take 1 tablet by mouth at bedtime.    [provider]  ?Multiple Vitamins-Minerals (MULTIVITAMIN WITH MINERALS) tablet Take 1 tablet by mouth daily.    [provider]  ?omeprazole (PRILOSEC) 40 MG capsule TAKE 1 CAPSULE TWICE DAILY 04/09/21   Laurey Morale, MD  ?spironolactone (ALDACTONE) 25 MG tablet TAKE 1 TABLET DAILY 05/03/21   Laurey Morale, MD  ?SYNTHROID 200 MCG tablet TAKE 1 TABLET DAILY 05/03/21   Laurey Morale, MD  ?temazepam (RESTORIL) 30 MG capsule Take 1 capsule (30 mg total) by mouth  at bedtime as needed for sleep. 12/26/20   Laurey Morale, MD  ?Fluticasone-Salmeterol (ADVAIR DISKUS) 250-50 MCG/DOSE AEPB Inhale 1 puff into the lungs in the morning and at bedtime. 01/18/20 04/12/20  Spero Geralds, MD  ? ?Physical Exam: ?Vitals:  ? 06/04/21 1241 06/04/21 1414 06/04/21 1546 06/04/21 1635  ?BP:  128/70 119/72 (!) 148/75  ?Pulse:  65 69 64  ?Resp:  16 10   ?Temp:   97.6 ?F (36.4 ?C)   ?  TempSrc:   Oral   ?SpO2: 92% 91% 92% 90%  ?Weight:      ?Height:      ? ? ?General exam: Moderately built and nourished patient, lying comfortably supine on the gurney in no obvious distress. ?Head, eyes and ENT: Nontraumatic and normocephalic.  ?Neck: Supple. No JVD, carotid bruit or thyromegaly. ?Lymphatics: No lymphadenopathy. ?Respiratory system: Clear to auscultation. No increased work of breathing. ?Cardiovascular system: S1 and S2 heard, RRR. No JVD, murmurs, gallops, clicks or pedal edema. ?Gastrointestinal system: Abdomen is nondistended, soft and nontender. Normal bowel sounds heard. No organomegaly or masses appreciated. ?Central nervous system: Alert and oriented. No focal neurological deficits. ?Extremities: Trace edema ?Skin: No rashes or acute findings. ?Musculoskeletal system: Negative exam. ?Psychiatry: Pleasant and cooperative. ? ? ?Labs on Admission:  ?Basic Metabolic Panel: ?Recent Labs  ?Lab 06/04/21 ?4536  ?NA 137  ?K 4.7  ?CL 96*  ?CO2 33*  ?GLUCOSE 127*  ?BUN 25*  ?CREATININE 1.51*  ?CALCIUM 9.0  ? ?Liver Function Tests: ?Recent Labs  ?Lab 06/04/21 ?4680  ?AST 18  ?ALT 26  ?ALKPHOS 63  ?BILITOT 0.7  ?PROT 6.8  ?ALBUMIN 4.0  ? ?No results for input(s): LIPASE, AMYLASE in the last 168 hours. ?No results for input(s): AMMONIA in the last 168 hours. ?CBC: ?Recent Labs  ?Lab 06/04/21 ?3212  ?WBC 7.9  ?HGB 15.8*  ?HCT 49.4*  ?MCV 92.2  ?PLT 161  ? ?Cardiac Enzymes: ?No results for input(s): CKTOTAL, CKMB, CKMBINDEX, TROPONINI in the last 168 hours. ? ?BNP (last 3 results) ?No results for input(s):  PROBNP in the last 8760 hours. ?CBG: ?No results for input(s): GLUCAP in the last 168 hours. ? ?Radiological Exams on Admission: ?CT Head Wo Contrast ? ?Result Date: 06/04/2021 ?CLINICAL DATA:  Headache for 3 days

## 2021-06-04 NOTE — ED Notes (Signed)
ED Provider at bedside. 

## 2021-06-04 NOTE — ED Notes (Signed)
Attempt x1 to give report to floor  ? ?

## 2021-06-04 NOTE — ED Notes (Signed)
Pts' O2 sats were 97% on 5L [place], decreased her O2 to 3.5 L [place] sats dropped to 89% but quickly went back up to 92%, increased her O2 back up to 4L [place], sats currently 95% on %L [place]. ? ?Pt reprts her oxygen is usually 89-90% on RA d/t COPD  ?

## 2021-06-04 NOTE — ED Notes (Signed)
Pt escorted to restroom, placed a urinary collection hat in toilet prior to pt going, pt missed hat during collection process, will attempt again later  ? ?

## 2021-06-04 NOTE — Progress Notes (Signed)
Plan of Care Note for accepted transfer ? ? ?Patient: Nancy [name] MRN: [id number]   DOA: 06/04/2021 ? ?Facility requesting transfer: [facility]. ?Requesting Provider: Varney Biles, MD ?Reason for transfer: Hypoxia, flank pain, headache with blurred vision. ?Facility course:  ?66 year old female with a past medical history of CAD, metabolic syndrome, hypothyroidism, hyperlipidemia, GERD headache, lower back pain hypocalcemia, hypomagnesemia, unspecified stage III CKD, prediabetes, depression with anxiety, morbid obesity with BMI of 53.25 kg/m? who presented to the emergency department complaints of bilateral flank pain left greater than right, associated with nausea, but no urinary symptoms that started yesterday..  She also complained of having a headache for the past 3 days associated with blurry vision for multiple weeks.  She was found to be hypoxic in the 70s on room air when she arrived to the emergency department.  She stated she did not feel dyspneic at the moment.  BNP mildly elevated with chest radiograph showing cardiomegaly and mild pulmonary vascular congestion.  CTA chest again showing possible mild pulmonary edema and ataxia of main pulmonary artery suggesting pulmonary arterial hypertension.  The patient is also polycythemic with a hemoglobin of 15.8 g/dL.  D-dimer was negative.  She is currently requiring 4 L of oxygen to keep her O2 sats above 90%.  We are going to add ABG given hypoxia and suspicion for obesity hypoventilation syndrome.  We are also going to request a head CT given recent headache and blurry vision. ? ?Plan of care: ?The patient is accepted for admission to Progressive unit, at [facility]..  ? ?Author: ?Reubin Milan, MD ?06/04/2021 ? ?Check [email or web address] for on-call coverage. ? ?Nursing staff, Please call [place] number on Amion as soon as patient's arrival, so appropriate admitting provider can evaluate the pt. ? ?This document was prepared using  Dragon voice recognition software and may contain some unintended transcription errors. ?

## 2021-06-04 NOTE — ED Notes (Signed)
RT note: Pt. did  not use morning Inhalers/Neb, placed on 5 lpm n/c=92-95%. ?

## 2021-06-05 ENCOUNTER — Inpatient Hospital Stay (HOSPITAL_COMMUNITY): Payer: 59

## 2021-06-05 DIAGNOSIS — J9601 Acute respiratory failure with hypoxia: Secondary | ICD-10-CM | POA: Diagnosis not present

## 2021-06-05 DIAGNOSIS — R0609 Other forms of dyspnea: Secondary | ICD-10-CM

## 2021-06-05 DIAGNOSIS — I5032 Chronic diastolic (congestive) heart failure: Secondary | ICD-10-CM | POA: Diagnosis not present

## 2021-06-05 DIAGNOSIS — I10 Essential (primary) hypertension: Secondary | ICD-10-CM | POA: Diagnosis not present

## 2021-06-05 DIAGNOSIS — N1831 Chronic kidney disease, stage 3a: Secondary | ICD-10-CM | POA: Diagnosis not present

## 2021-06-05 LAB — CBC
HCT: 47.6 % — ABNORMAL HIGH (ref 36.0–46.0)
Hemoglobin: 15.5 g/dL — ABNORMAL HIGH (ref 12.0–15.0)
MCH: 31.1 pg (ref 26.0–34.0)
MCHC: 32.6 g/dL (ref 30.0–36.0)
MCV: 95.4 fL (ref 80.0–100.0)
Platelets: 180 10*3/uL (ref 150–400)
RBC: 4.99 MIL/uL (ref 3.87–5.11)
RDW: 15.4 % (ref 11.5–15.5)
WBC: 7.4 10*3/uL (ref 4.0–10.5)
nRBC: 0 % (ref 0.0–0.2)

## 2021-06-05 LAB — BASIC METABOLIC PANEL
Anion gap: 8 (ref 5–15)
BUN: 23 mg/dL (ref 8–23)
CO2: 34 mmol/L — ABNORMAL HIGH (ref 22–32)
Calcium: 8.3 mg/dL — ABNORMAL LOW (ref 8.9–10.3)
Chloride: 97 mmol/L — ABNORMAL LOW (ref 98–111)
Creatinine, Ser: 1.44 mg/dL — ABNORMAL HIGH (ref 0.44–1.00)
GFR, Estimated: 41 mL/min — ABNORMAL LOW (ref >60)
Glucose, Bld: 100 mg/dL — ABNORMAL HIGH (ref 70–99)
Potassium: 3.9 mmol/L (ref 3.5–5.1)
Sodium: 139 mmol/L (ref 135–145)

## 2021-06-05 LAB — ECHOCARDIOGRAM COMPLETE
Area-P 1/2: 2.29 cm2
Height: 67 in
S' Lateral: 4 cm
Weight: 5440 oz

## 2021-06-05 LAB — GLUCOSE, CAPILLARY
Glucose-Capillary: 115 mg/dL — ABNORMAL HIGH (ref 70–99)
Glucose-Capillary: 106 mg/dL — ABNORMAL HIGH (ref 70–99)
Glucose-Capillary: 128 mg/dL — ABNORMAL HIGH (ref 70–99)
Glucose-Capillary: 115 mg/dL — ABNORMAL HIGH (ref 70–99)

## 2021-06-05 MED ORDER — LORATADINE 10 MG PO TABS
10.0000 mg | ORAL_TABLET | Freq: Every day | ORAL | Status: DC
  Administered 2021-06-06: 10 mg via ORAL
  Filled 2021-06-05 (×2): qty 1

## 2021-06-05 MED ORDER — PERFLUTREN LIPID MICROSPHERE
1.0000 mL | INTRAVENOUS | Status: AC | PRN
  Administered 2021-06-05: 2 mL via INTRAVENOUS
  Filled 2021-06-05: qty 10

## 2021-06-05 MED ORDER — MONTELUKAST SODIUM 10 MG PO TABS
10.0000 mg | ORAL_TABLET | Freq: Every day | ORAL | Status: DC
  Administered 2021-06-05: 10 mg via ORAL
  Filled 2021-06-05: qty 1

## 2021-06-05 NOTE — TOC Initial Note (Signed)
Transition of Care (TOC) - Initial/Assessment Note  ? ? ?Patient Details  ?Name: Nancy [name] ?MRN: [id number] ?Date of Birth: 12/30/1955 ? ?Transition of Care [facility]) CM/SW Contact:    ?Leeroy Cha, RN ?Phone Number: ?06/05/2021, 9:19 AM ? ?Clinical Narrative:                 ? ?Transition of Care (TOC) Screening Note ? ? ?Patient Details  ?Name: Nancy [name] ?Date of Birth: 1955-07-30 ? ? ?Transition of Care [facility]) CM/SW Contact:    ?Leeroy Cha, RN ?Phone Number: ?06/05/2021, 9:19 AM ? ? ? ?Transition of Care Department [facility]) has reviewed patient and no TOC needs have been identified at this time. We will continue to monitor patient advancement through interdisciplinary progression rounds. If new patient transition needs arise, please place a TOC consult. ? ? ? ?Expected Discharge Plan: Home/Self Care ?Barriers to Discharge: No Barriers Identified ? ? ?Patient Goals and CMS Choice ?Patient states their goals for this hospitalization and ongoing recovery are:: to go back my home and be well ?  ?  ? ?Expected Discharge Plan and Services ?Expected Discharge Plan: Home/Self Care ?  ?Discharge Planning Services: CM Consult ?  ?Living arrangements for the past 2 months: [place] ?                ?  ?  ?  ?  ?  ?  ?  ?  ?  ?  ? ?Prior Living Arrangements/Services ?Living arrangements for the past 2 months: [place] ?Lives with:: Self ?Patient language and need for interpreter reviewed:: Yes ?Do you feel safe going back to the place where you live?: Yes      ?  ?  ?  ?Criminal Activity/Legal Involvement Pertinent to Current Situation/Hospitalization: No - Comment as needed ? ?Activities of Daily Living ?Home Assistive Devices/Equipment: None ?ADL Screening (condition at time of admission) ?Patient's cognitive ability adequate to safely complete daily activities?: Yes ?Is the patient deaf or have difficulty hearing?: No ?Does the patient have difficulty seeing, even when wearing  glasses/contacts?: No ?Does the patient have difficulty concentrating, remembering, or making decisions?: No ?Patient able to express need for assistance with ADLs?: Yes ?Does the patient have difficulty dressing or bathing?: No ?Independently performs ADLs?: Yes (appropriate for developmental age) ?Does the patient have difficulty walking or climbing stairs?: No ?Weakness of Legs: None ?Weakness of Arms/Hands: None ? ?Permission Sought/Granted ?  ?  ?   ?   ?   ?   ? ?Emotional Assessment ?Appearance:: Appears stated age ?  ?  ?Orientation: : Oriented to Self, Oriented to Place, Oriented to  Time, Oriented to Situation ?Alcohol / Substance Use: Not Applicable ?Psych Involvement: No (comment) ? ?Admission diagnosis:  Acute respiratory failure with hypoxia ([place]) [J96.01] ?Acute hypoxemic respiratory failure ([place]) [J96.01] ?Patient Active Problem List  ? Diagnosis Date Noted  ? Acute respiratory failure with hypoxia ([place]) 06/04/2021  ? Prediabetes 04/04/2021  ? Depression with anxiety 04/04/2021  ? Chronic narcotic use 01/20/2019  ? Chronic kidney disease (CKD), stage III (moderate) ([place]) 12/07/2018  ? Idiopathic hypoparathyroidism ([place]) 12/07/2018  ? Hypocalcemia 11/26/2018  ? Chronic diastolic CHF (congestive heart failure) ([place]) 11/26/2018  ? Hypomagnesemia 11/26/2018  ? Acute-on-chronic kidney injury ([place]) 11/26/2018  ? Generalized abdominal cramping 11/20/2018  ? Asthma 03/17/2018  ? Bilateral leg edema 03/17/2018  ? Low back pain radiating to right leg 08/14/2017  ? Super obesity 05/16/2017  ? Chest pain with  moderate risk for cardiac etiology 02/19/2017  ? Chest pain 10/24/2011  ? LIPOMA 09/12/2008  ? Coronary atherosclerosis 07/30/2007  ? ANEMIA-NOS 11/10/2006  ? MENOPAUSAL SYNDROME 11/10/2006  ? HEADACHE 11/10/2006  ? Hypothyroidism 09/25/2006  ? Hypercholesterolemia 09/25/2006  ? Essential hypertension 09/25/2006  ? ALLERGIC RHINITIS 09/25/2006  ? GERD 09/25/2006  ? ?PCP:  Laurey Morale, MD ?Pharmacy:    ?[facility], [place] - [address] ?[address] ?[place] [address] ?Phone: [phone] Fax: [phone] ? ?[facility] [place] [place] [facility] Registered [facility] Sites ?[address] ?[place] [address] ?Phone: [phone] Fax: [phone] ? ?[facility] [place] ?[place] ?[place] [address] ?Phone: [phone] Fax: [phone] ? ? ? ? ?Social Determinants of Health (SDOH) Interventions ?  ? ?Readmission Risk Interventions ?   ? View : No data to display.  ?  ?  ?  ? ? ? ?

## 2021-06-05 NOTE — Progress Notes (Signed)
[facility] ?PROGRESS NOTE ? ? ? ?Progress Note  ?Nancy [name]  DZH:[id number] DOB: 27-Dec-1955 DOA: 06/04/2021 ?PCP: Laurey Morale, MD  ? ? ? ?Brief Narrative:  ? ?Nancy [name] is an 66 y.o. female past medical history of CAD, metabolic syndrome, hypothyroidism, low back pain, chronic kidney disease stage III, chronic diastolic heart failure by echo in 2019 with an EF of 41% grade 1 diastolic dysfunction, diabetes mellitus type 2 with a last A1c of 6.5 severely morbid obesity comes in complaining of bilateral flank pain was found to be hypoxic in the ED.  CT angio of the chest showed no PE.  This showed pulmonary edema.  With a BNP of 232 ? ? ? ?Assessment/Plan:  ? ?Acute respiratory failure with hypoxia ([place]) ?Of unclear etiology.  She has been taking her Lasix at home. ?BNP elevated at 200 could be falsely low due to her obesity.  Due to her body habitus cannot evaluate her volume status. ?She was started on IV Lasix, her creatinine has improved, she still desats when taken off the oxygen.Marland Kitchen ?She is negative about 500 cc which she consumed about 2 L of fluid just yesterday. ?2D echo is pending. ?She also has pulmonary hypertension by CT and the fact that her chloride is low in a morbidly obese patient. ?Urinary sodium is pending. ? ?Chronic diastolic heart failure: ?Negative about 500 cc continue IV diuresis Aldactone. ?Continue metoprolol. ? ?Acute kidney injury on chronic kidney disease stage IIIa: ?Creatinine on admission 1.5, baseline is around 1.1. ?Today is 1.4. ? ?Polycythemia: ?Probably secondary to chronic hypoxia due to probable pulmonary hypertension. ? ?Essential hypertension: ?Continue metoprolol continue IV Lasix and Aldactone. ?Blood pressure is relatively stable. ? ?Diabetes mellitus type 2: ?Acute issues started on Ozempic. ?A1c 6.8 continue sliding scale insulin. ? ?Hypothyroidism ?Continue Synthroid. ? ?Hypercholesterolemia ?Continue statins. ? ?Morbidly obese: ?Counseling. ? ? ?DVT  prophylaxis: lovenox ?Family Communication:none ?Status is: Inpatient ?Remains inpatient appropriate because: Acute respiratory failure with hypoxia. ? ? ? ?Code Status:  ? ?  ?Code Status Orders  ?(From admission, onward)  ?  ? ? ?  ? ?  Start     Ordered  ? 06/04/21 1724  Full code  Continuous       ? 06/04/21 1728  ? ?  ?  ? ?  ? ?Code Status History   ? ? Date Active Date Inactive Code Status Order ID Comments User Context  ? 11/26/2018 2350 11/28/2018 1705 Full Code [id number]  Lenore Cordia, MD Inpatient  ? 02/19/2017 1757 02/20/2017 1833 Full Code [id number]  Leanor Kail, PA Inpatient  ? 10/25/2011 0029 10/26/2011 1602 Full Code [id number]  Harlan Stains, RN ED  ? ?  ? ? ? ? ?IV Access:  ? ?Peripheral IV ? ? ?Procedures and diagnostic studies:  ? ?CT Head Wo Contrast ? ?Result Date: 06/04/2021 ?CLINICAL DATA:  Headache for 3 days. EXAM: CT HEAD WITHOUT CONTRAST TECHNIQUE: Contiguous axial images were obtained from the base of the skull through the vertex without intravenous contrast. RADIATION DOSE REDUCTION: This exam was performed according to the departmental dose-optimization program which includes automated exposure control, adjustment of the mA and/or kV according to patient size and/or use of iterative reconstruction technique. COMPARISON:  None Available. FINDINGS: Brain: There is no evidence of an acute infarct, mass, midline shift, or extra-axial fluid collection. Within limitations of residual contrast material, no acute intracranial hemorrhage is identified. The ventricles and sulci are normal. Vascular: Residual intravascular contrast material from today's  earlier chest, abdomen, and pelvis CTs. Skull: No fracture or suspicious osseous lesion. Sinuses/Orbits: Minimal mucosal thickening in the left maxillary sinus. Small volume fluid and/or mucosal thickening in the left sphenoid sinus. Small right mastoid effusion. Unremarkable orbits. Other: None. IMPRESSION: No evidence of acute intracranial  abnormality. Electronically Signed   By: Logan Bores M.D.   On: 06/04/2021 13:49  ? ?CT Angio Chest PE W and/or Wo Contrast ? ?Result Date: 06/04/2021 ?CLINICAL DATA:  Hypoxia, flank pain EXAM: CT ANGIOGRAPHY CHEST WITH CONTRAST TECHNIQUE: Multidetector CT imaging of the chest was performed using the standard protocol during bolus administration of intravenous contrast. Multiplanar CT image reconstructions and MIPs were obtained to evaluate the vascular anatomy. RADIATION DOSE REDUCTION: This exam was performed according to the departmental dose-optimization program which includes automated exposure control, adjustment of the mA and/or kV according to patient size and/or use of iterative reconstruction technique. CONTRAST:  89m OMNIPAQUE IOHEXOL 350 MG/ML SOLN COMPARISON:  11/27/2017 FINDINGS: Cardiovascular: There is homogeneous enhancement in thoracic aorta. There is ectasia of main pulmonary artery measuring 4.2 cm suggesting pulmonary arterial hypertension. There are no intraluminal filling defects in the central pulmonary artery branches. Evaluation of small peripheral pulmonary artery branches is limited by less than optimal contrast enhancement and motion artifacts. Heart is enlarged in size. Scattered coronary artery calcifications are seen. Mediastinum/Nodes: No new significant lymphadenopathy seen. Surgical clips are seen in the thyroid bed. Lungs/Pleura: There are small linear densities in the left lower lung fields suggesting scarring or subsegmental atelectasis. There is no pleural effusion or pneumothorax. Faint ground-glass densities seen in the parahilar regions. Upper Abdomen: Unremarkable. Musculoskeletal: Unremarkable. Review of the MIP images confirms the above findings. IMPRESSION: There is no evidence of central pulmonary artery embolism. Evaluation of small peripheral branches is limited by less than optimal contrast enhancement and breathing motion. There is ectasia of main pulmonary artery  suggesting pulmonary arterial hypertension. There is no evidence of thoracic aortic dissection. Coronary artery calcifications are seen. Faint ground-glass densities in the parahilar regions may suggest scarring or mild pulmonary edema. There is no focal pulmonary consolidation. Electronically Signed   By: PElmer PickerM.D.   On: 06/04/2021 11:59  ? ?CT ABDOMEN PELVIS W CONTRAST ? ?Result Date: 06/04/2021 ?CLINICAL DATA:  Flank pain, started yesterday EXAM: CT ABDOMEN AND PELVIS WITH CONTRAST TECHNIQUE: Multidetector CT imaging of the abdomen and pelvis was performed using the standard protocol following bolus administration of intravenous contrast. RADIATION DOSE REDUCTION: This exam was performed according to the departmental dose-optimization program which includes automated exposure control, adjustment of the mA and/or kV according to patient size and/or use of iterative reconstruction technique. CONTRAST:  784mOMNIPAQUE IOHEXOL 350 MG/ML SOLN COMPARISON:  None Available. FINDINGS: Lower chest: No acute abnormality. Hepatobiliary: Diffuse low attenuation of the liver as can be seen with hepatic steatosis with more focal fatty deposition in the left hepatic lobe. No gallstones, gallbladder wall thickening, or biliary dilatation. Pancreas: Unremarkable. No pancreatic ductal dilatation or surrounding inflammatory changes. Spleen: Normal in size without focal abnormality. Adrenals/Urinary Tract: Adrenal glands are unremarkable. Kidneys are normal, without renal calculi, focal lesion, or hydronephrosis. Bladder is unremarkable. Stomach/Bowel: Stomach is within normal limits. No evidence of bowel wall thickening, distention, or inflammatory changes. Appendix is normal. Diverticulosis without evidence of diverticulitis. Vascular/Lymphatic: Normal caliber abdominal aorta with mild atherosclerosis. No lymphadenopathy. Reproductive: Uterus and bilateral adnexa are unremarkable. Other: Small fat containing umbilical  hernia. No abdominopelvic ascites. Musculoskeletal: No acute osseous abnormality. No aggressive osseous  lesion. Degenerative disease with disc height loss at L5-S1. IMPRESSION: 1. No acute abdominal or pelvic

## 2021-06-05 NOTE — Progress Notes (Signed)
SATURATION QUALIFICATIONS: (This note is used to comply with regulatory documentation for home oxygen) ? ?Patient Saturations on Room Air at Rest = 87% ? ?Patient Saturations on Room Air while Ambulating = 83% ? ?Patient Saturations on 3 Liters of oxygen while Ambulating = 88-90% ? ?Please briefly explain why patient needs home oxygen: ?Patient requiring 3L via Nasal cannula to maintain saturations about 88% ?

## 2021-06-06 DIAGNOSIS — I2510 Atherosclerotic heart disease of native coronary artery without angina pectoris: Secondary | ICD-10-CM | POA: Diagnosis not present

## 2021-06-06 DIAGNOSIS — J9601 Acute respiratory failure with hypoxia: Secondary | ICD-10-CM | POA: Diagnosis not present

## 2021-06-06 MED ORDER — FUROSEMIDE 80 MG PO TABS: 80.0000 mg | ORAL_TABLET | Freq: Every day | ORAL | 0 refills | Status: DC

## 2021-06-06 MED ORDER — FUROSEMIDE 20 MG PO TABS: 80.0000 mg | ORAL_TABLET | Freq: Every day | ORAL | 0 refills | Status: DC

## 2021-06-06 NOTE — Plan of Care (Signed)

## 2021-06-06 NOTE — Discharge Summary (Addendum)
?Discharge Summary ? ?Nancy [name] DOB: [birth year] ? ?PCP: Laurey Morale, MD ? ?Admit date: 06/04/2021 ?Discharge date: 06/06/2021 ? ?Time spent:  73mns ? ?Recommendations for Outpatient Follow-up:  ?F/u with PCP within a week  for hospital discharge follow up, repeat cbc/bmp at follow up, patient prefer to tall to pcp regarding cardiology follow up for diastolic chf and pulmonary htn, recommend outpatient sleep study ? Home 02 ?Advised daily weight and bring in record for pcp to review ?New meds lasix 821mdaily, pcp to follow up with patient to decide further lasix dose adjustment if indicated  ? ?Discharge Diagnoses:  ?Active Hospital Problems  ? Diagnosis Date Noted  ? Acute respiratory failure with hypoxia ([facility]/08[facility]2021  ? Chronic kidney disease (CKD), stage III (moderate) ([facility]/09/2018  ? Chronic diastolic CHF (congestive heart failure) ([facility]/2020  ? Super obesity 05/16/2017  ? Hypothyroidism 09/25/2006  ? Hypercholesterolemia 09/25/2006  ? Essential hypertension 09/25/2006  ?  ?Resolved Hospital Problems  ?No resolved problems to display.  ? ? ?Discharge Condition: stable ? ?Diet recommendation: heart healthy/fluids restriction to 1200cc per day ? ?Filed Weights  ? 06/04/21 0819  ?Weight: (!) 154.2 kg  ? ? ?History of present illness: ( per admitting MD Dr FeAileen Fass?PCP: FrLaurey MoraleMD ?Patient coming from: Home ?  ?I have personally briefly reviewed patient[place]  ?  ?Chief Complaint: SOB and back pain ?  ?HPI: Nancy Lemleys a [age].o. female past medical history of CAD, metabolic syndrome, hypothyroidism hyperlipidemia low back pain chronic kidney disease stage III, chronic diastolic heart failure, by echo on 2019 with an EF of 50% and grade 1 diastolic heart failure, on diabetes mellitus with a last hemoglobin A1c of 6.5, severe morbid obesity comes into the emergency room complaining of bilateral flank pain left greater than right  associated with nausea but no urinary symptoms she relates has been having a headache for the past 3 days in the ED was found to be hypoxic in the 70s when she arrived, her BNP was mildly elevated chest x-ray shows cardiomegaly with mild pulmonary.  She denies any chest pain some dyspnea on exertion and orthopnea. ? ?In the ED: ?She was found to be hypoxic CT angio of the chest showed no PE but it did showed ectasis of the main pulmonary artery and pulmonary edema.  Creatinine of 1.5 BNP of 232 hemoglobin of 16 SARS-CoV-2 and influenza PCR were negative UA showed no signs of infection.  ? ?Hospital Course:  ?Principal Problem: ?  Acute respiratory failure with hypoxia ([facility]?Active Problems: ?  Hypothyroidism ?  Hypercholesterolemia ?  Essential hypertension ?  Super obesity ?  Chronic diastolic CHF (congestive heart failure) ([facility]?  Chronic kidney disease (CKD), stage III (moderate) ([facility]) ? ? ?Assessment and Plan: ? ?Acute respiratory failure with hypoxia ([facility]) likely from acute on chronic diastolic CHF and pulmonary hypertension ?She has been taking her Lasix at home ,but does drink a lot of fluids at home ?CTA chest: ectasia of main pulmonary artery suggesting pulmonary arterial hypertension, Faint ground-glass densities in the parahilar regions may suggest ?CT ab/pel no acute findings  ?scarring or mild pulmonary edema. There is no focal pulmonary ?consolidation. ?BNP elevated at 200 could be falsely low due to her obesity.  Due to her body habitus cannot evaluate her volume status. ?Treated with iv lasix 8062mid in the hospital with great response, symptom has improved, she  walked and meet home o2 criteria, home o2 arranged ?She desires to go home, she is discharged on increase dose of lasix, she is advised to f/u with pcp and cardiology ?  ? ?Acute kidney injury on chronic kidney disease stage IIIa: ?Creatinine on admission 1.5, baseline is around 1.1. ? 1.4 on repeat,  ?F/u with pcp to continue monitor  renal function  ? ?Polycythemia: ?Probably secondary to chronic hypoxia due to probable pulmonary hypertension. ?F/u with pcp ?  ?Essential hypertension: ?Bp stable on current regimen  ? ?Diabetes mellitus type 2, controlled  ?Acute issues started on Ozempic. ?A1c 6.8 continue sliding scale insulin. ?F/u with pcp ? ?Hypothyroidism ?Continue Synthroid. ?  ?Hypercholesterolemia ?Continue statins. ? ?Incidental findings on CT ab/pel: ? Hepatic steatosis. ?Diverticulosis without evidence of diverticulitis. ? Aortic Atherosclerosis  ?F/u with pcp ? ?Body mass index is 53.25 kg/m?Marland Kitchen Meet criteria for class III obesity ?Counseling. ?F/u with pcp ?  ? Discharge Exam: ?BP 106/79 (BP Location: Left Arm)   Pulse 61   Temp 98.4 ?F (36.9 ?C) (Oral)   Resp 18   Ht 5' 7"  (1.702 m)   Wt (!) 154.2 kg   SpO2 95%   BMI 53.25 kg/m?  ? ?General: NAD ?Cardiovascular: RRR, no edema ?Respiratory: CTABL ? ? ? ?Discharge Instructions   ? ? Diet - low sodium heart healthy   Complete by: As directed ?  ? 1200cc fluids restriction  ?Please weight yourself daily, please call your doctor if your weight go up by 3lbs in 24hrs or by 5lbs in a week ?Please  bring in your record for your pcp to review  ? Increase activity slowly   Complete by: As directed ?  ? ?  ? ?Allergies as of 06/06/2021   ? ?   Reactions  ? Sulfa Antibiotics Hives  ? Sulfamethoxazole Hives  ? ?  ? ?  ?Medication List  ?  ? ?TAKE these medications   ? ?albuterol (2.5 MG/3ML) 0.083% nebulizer solution ?Commonly known as: PROVENTIL ?INHALE 3 MLS (ONE VIAL) BY NEBULIZATION EVERY 4 HOURS AS NEEDED FOR WHEEZING OR SHORTNESS OF BREATH. ?  ?albuterol 108 (90 Base) MCG/ACT inhaler ?Commonly known as: VENTOLIN HFA ?Inhale 2 puffs into the lungs every 4 (four) hours as needed for wheezing or shortness of breath. ?  ?aspirin 81 MG tablet ?Take 81 mg by mouth at bedtime. ?  ?atorvastatin 80 MG tablet ?Commonly known as: LIPITOR ?TAKE 1 TABLET DAILY ?  ?budesonide-formoterol 160-4.5  MCG/ACT inhaler ?Commonly known as: SYMBICORT ?Inhale 2 puffs into the lungs in the morning and at bedtime. ?  ?calcitRIOL 0.25 MCG capsule ?Commonly known as: ROCALTROL ?Take 1 capsule (0.25 mcg total) by mouth 2 (two) times daily. ?  ?calcium citrate 950 (200 Ca) MG tablet ?Commonly known as: Calcitrate ?Take 1 tablet (200 mg of elemental calcium total) by mouth 3 (three) times daily. ?  ?cetirizine 10 MG tablet ?Commonly known as: ZYRTEC ?Take 10 mg by mouth daily. ?  ?diphenhydramine-acetaminophen 25-500 MG Tabs tablet ?Commonly known as: TYLENOL PM ?Take 1 tablet by mouth at bedtime. ?  ?escitalopram 20 MG tablet ?Commonly known as: Lexapro ?Take 1 tablet (20 mg total) by mouth daily. ?  ?furosemide 80 MG tablet ?Commonly known as: Lasix ?Take 1 tablet (80 mg total) by mouth daily. ?What changed:  ?medication strength ?how much to take ?  ?furosemide 80 MG tablet ?Commonly known as: Lasix ?Take 1 tablet (80 mg total) by mouth daily. ?What changed: You were  already taking a medication with the same name, and this prescription was added. Make sure you understand how and when to take each. ?  ?magnesium oxide 400 (240 Mg) MG tablet ?Commonly known as: MAG-OX ?TAKE 1 TABLET BY MOUTH ONCE DAILY ?  ?metoprolol tartrate 25 MG tablet ?Commonly known as: LOPRESSOR ?TAKE 1 TABLET TWICE A DAY ?  ?montelukast 10 MG tablet ?Commonly known as: SINGULAIR ?TAKE 1 TABLET AT BEDTIME ?  ?multivitamin tablet ?Take 1 tablet by mouth at bedtime. ?  ?multivitamin with minerals tablet ?Take 1 tablet by mouth daily. ?  ?NONFORMULARY OR COMPOUNDED ITEM ?Inject into the skin every Friday. Ozempic/B12 compounded item from specialty pharmacy for weight loss ?  ?omeprazole 40 MG capsule ?Commonly known as: PRILOSEC ?TAKE 1 CAPSULE TWICE DAILY ?What changed: when to take this ?  ?spironolactone 25 MG tablet ?Commonly known as: ALDACTONE ?TAKE 1 TABLET DAILY ?  ?Synthroid 200 MCG tablet ?Generic drug: levothyroxine ?TAKE 1 TABLET DAILY ?What  changed:  ?how much to take ?when to take this ?  ?temazepam 30 MG capsule ?Commonly known as: RESTORIL ?Take 1 capsule (30 mg total) by mouth at bedtime as needed for sleep. ?  ? ?  ? ?  ?  ? ? ?  ?Durable Medical

## 2021-06-06 NOTE — TOC Progression Note (Signed)
Transition of Care (TOC) - Progression Note  ? ? ?Patient Details  ?Name: Makensie Mulhall ?MRN: [id number] ?Date of Birth: [birth year] ? ?Transition of Care (TOC) CM/SW Contact  ?Purcell Mouton, RN ?Phone Number: ?06/06/2021, 11:20 AM ? ?Clinical Narrative:    ? ?Spoke with pt concerning O2, [employer] was selected. Referral was called to [employer] in house rep.  ? ?Expected Discharge Plan: Home/Self Care ?Barriers to Discharge: No Barriers Identified ? ?Expected Discharge Plan and Services ?Expected Discharge Plan: Home/Self Care ?  ?Discharge Planning Services: CM Consult ?Post Acute Care Choice: Durable Medical Equipment, Home Health ?Living arrangements for the past 2 months: [place] ?Expected Discharge Date: 06/06/21               ?  ?  ?  ?  ?  ?  ?  ?  ?  ?  ? ? ?Social Determinants of Health (SDOH) Interventions ?  ? ?Readmission Risk Interventions ?   ? View : No data to display.  ?  ?  ?  ? ? ?

## 2021-06-07 LAB — CALCIUM, URINE, RANDOM: Calcium, Ur: 6.6 mg/dL

## 2021-06-11 ENCOUNTER — Other Ambulatory Visit: Payer: Self-pay | Admitting: Internal Medicine

## 2021-06-11 ENCOUNTER — Other Ambulatory Visit: Payer: Self-pay | Admitting: Family Medicine

## 2021-06-18 ENCOUNTER — Ambulatory Visit (INDEPENDENT_AMBULATORY_CARE_PROVIDER_SITE_OTHER): Payer: 59 | Admitting: Family Medicine

## 2021-06-18 ENCOUNTER — Other Ambulatory Visit (HOSPITAL_COMMUNITY): Payer: Self-pay

## 2021-06-18 VITALS — BP 130/80 | HR 66 | Temp 98.4°F | Wt 334.0 lb

## 2021-06-18 DIAGNOSIS — N183 Chronic kidney disease, stage 3 unspecified: Secondary | ICD-10-CM | POA: Diagnosis not present

## 2021-06-18 DIAGNOSIS — I10 Essential (primary) hypertension: Secondary | ICD-10-CM | POA: Diagnosis not present

## 2021-06-18 DIAGNOSIS — N1832 Chronic kidney disease, stage 3b: Secondary | ICD-10-CM

## 2021-06-18 DIAGNOSIS — N1831 Chronic kidney disease, stage 3a: Secondary | ICD-10-CM | POA: Diagnosis not present

## 2021-06-18 DIAGNOSIS — I5032 Chronic diastolic (congestive) heart failure: Secondary | ICD-10-CM

## 2021-06-18 DIAGNOSIS — R600 Localized edema: Secondary | ICD-10-CM | POA: Diagnosis not present

## 2021-06-18 DIAGNOSIS — I272 Pulmonary hypertension, unspecified: Secondary | ICD-10-CM

## 2021-06-18 DIAGNOSIS — N179 Acute kidney failure, unspecified: Secondary | ICD-10-CM | POA: Diagnosis not present

## 2021-06-18 DIAGNOSIS — J9601 Acute respiratory failure with hypoxia: Secondary | ICD-10-CM

## 2021-06-18 DIAGNOSIS — E669 Obesity, unspecified: Secondary | ICD-10-CM | POA: Diagnosis not present

## 2021-06-18 DIAGNOSIS — I951 Orthostatic hypotension: Secondary | ICD-10-CM

## 2021-06-18 MED ORDER — MONTELUKAST SODIUM 10 MG PO TABS: 10.0000 mg | ORAL_TABLET | Freq: Every day | ORAL | 3 refills | Status: DC

## 2021-06-18 MED ORDER — TEMAZEPAM 30 MG PO CAPS
30.0000 mg | ORAL_CAPSULE | Freq: Every evening | ORAL | 1 refills | Status: DC | PRN
  Filled 2021-06-18: qty 90, 90d supply, fill #0
  Filled 2021-10-02: qty 90, 90d supply, fill #1

## 2021-06-18 MED ORDER — FUROSEMIDE 80 MG PO TABS: 80.0000 mg | ORAL_TABLET | Freq: Every day | ORAL | 3 refills | Status: DC

## 2021-06-18 NOTE — Progress Notes (Signed)
   Subjective:    Patient ID: Nancy [name], female    DOB: January 06, 1956, 66 y.o.   MRN: [id number]  HPI Here with her daughter to follow up a hospital stay from 06-04-21 to 06-06-21 for weakness, SOB, and bilateral flank pain. She was found to be in acute heart failure and she was diuresed with Lasix. Her initial creatinine was 1.51 and this went done to 1.44 by DC. The BNP was elevated at 232. Her ECHO had not changed since 2019, showing an EF of 55-60% and Grade I diastolic dysfunction. A CTA of the lungs showed her to have pulmonary edema and had evidence of pulmonary HTN. No PE's were seen. Her BP was well controlled, and her A1c was 6.8. She was sent home on 3 liters of continuous oxygen. She is taking Lasix 80 mg daily. She feels better now with less SOB and less ankle swelling. Her weight has been stable since DC. She is on a daily fluid restriction of 1200 cc. Her main complaint now is sudden lightheadedness and weakness when she stands up from a sitting position. This has cause her to pass out for a few seconds on one occasion. Her daughter (who is an Therapist, sports) was able to keep her from falling.   Review of Systems  Constitutional: Negative.   Respiratory:  Positive for shortness of breath. Negative for cough, choking and wheezing.   Cardiovascular:  Positive for leg swelling. Negative for chest pain and palpitations.  Gastrointestinal: Negative.   Genitourinary: Negative.   Neurological:  Positive for syncope and light-headedness.      Objective:   Physical Exam Constitutional:      General: She is not in acute distress.    Appearance: She is obese.     Comments: Wearing [place] oxygen   Cardiovascular:     Rate and Rhythm: Normal rate and regular rhythm.     Pulses: Normal pulses.     Heart sounds: Normal heart sounds.  Pulmonary:     Effort: Pulmonary effort is normal.     Breath sounds: Normal breath sounds.  Musculoskeletal:     Comments: 1+ edema in both ankles   Neurological:      General: No focal deficit present.     Mental Status: She is alert and oriented to person, place, and time. Mental status is at baseline.          Assessment & Plan:  She is recovering from a recent acute exacerbation of chronic diastolic CHF.  She also has pulmonary HTN. We will keep her Lasix dose at 80 mg daily. Check a BMET today. She is having significant orthostasis, so we will refer her to see Cardiology again (Dr. Haroldine Laws). She will continue taking daily weights. We spent a total of (35   ) minutes reviewing records and discussing these issues.  Alysia Penna, MD

## 2021-06-19 ENCOUNTER — Other Ambulatory Visit (INDEPENDENT_AMBULATORY_CARE_PROVIDER_SITE_OTHER): Payer: 59

## 2021-06-19 DIAGNOSIS — N179 Acute kidney failure, unspecified: Secondary | ICD-10-CM | POA: Diagnosis not present

## 2021-06-19 DIAGNOSIS — N183 Chronic kidney disease, stage 3 unspecified: Secondary | ICD-10-CM | POA: Diagnosis not present

## 2021-06-20 LAB — BASIC METABOLIC PANEL
BUN: 16 mg/dL (ref 6–23)
CO2: 39 mEq/L — ABNORMAL HIGH (ref 19–32)
Calcium: 9.6 mg/dL (ref 8.4–10.5)
Chloride: 94 mEq/L — ABNORMAL LOW (ref 96–112)
Creatinine, Ser: 1.5 mg/dL — ABNORMAL HIGH (ref 0.40–1.20)
GFR: 36.88 mL/min — ABNORMAL LOW (ref >60.00)
Glucose, Bld: 126 mg/dL — ABNORMAL HIGH (ref 70–99)
Potassium: 4.4 mEq/L (ref 3.5–5.1)
Sodium: 142 mEq/L (ref 135–145)

## 2021-06-21 DIAGNOSIS — N1832 Chronic kidney disease, stage 3b: Secondary | ICD-10-CM | POA: Insufficient documentation

## 2021-06-21 NOTE — Addendum Note (Signed)
Addended by: Alysia Penna A on: 06/21/2021 01:49 PM   Modules accepted: Orders

## 2021-06-22 ENCOUNTER — Other Ambulatory Visit: Payer: Self-pay

## 2021-06-22 ENCOUNTER — Telehealth: Payer: Self-pay

## 2021-06-22 MED ORDER — DOXYCYCLINE HYCLATE 100 MG PO TABS: 100.0000 mg | ORAL_TABLET | Freq: Two times a day (BID) | ORAL | 0 refills | Status: DC

## 2021-06-22 NOTE — Telephone Encounter (Signed)
Pt request sent to Dr Sarajane Jews for advise

## 2021-06-22 NOTE — Telephone Encounter (Signed)
Call in Doxycycline 100 mg BID for 10 days

## 2021-06-22 NOTE — Telephone Encounter (Signed)
Pt Rx sent to her pharmacy per Dr Sarajane Jews, pt notified

## 2021-06-27 ENCOUNTER — Telehealth: Payer: Self-pay | Admitting: Family Medicine

## 2021-06-27 NOTE — Telephone Encounter (Signed)
Also Fax paperwork upon completion.

## 2021-06-27 NOTE — Telephone Encounter (Signed)
Disability form to be filled out--placed in dr's folder.  Call [phone] upon completion.

## 2021-06-27 NOTE — Telephone Encounter (Signed)
Pt form received and placed on Dr Sarajane Jews red folder

## 2021-07-03 ENCOUNTER — Other Ambulatory Visit: Payer: Self-pay | Admitting: Family Medicine

## 2021-07-03 NOTE — Telephone Encounter (Signed)
Pt paperwork was faxed on 07/02/2021

## 2021-07-04 DIAGNOSIS — Z0279 Encounter for issue of other medical certificate: Secondary | ICD-10-CM

## 2021-07-06 NOTE — Telephone Encounter (Signed)
Spoke with pt aware that disability form was completed and faxed as requested, pt will pick up the original from the office . Form is on Nancy [name]'s desk

## 2021-07-07 DIAGNOSIS — I2510 Atherosclerotic heart disease of native coronary artery without angina pectoris: Secondary | ICD-10-CM | POA: Diagnosis not present

## 2021-07-09 ENCOUNTER — Encounter: Payer: Self-pay | Admitting: Family Medicine

## 2021-07-09 NOTE — Telephone Encounter (Signed)
I called [facility] pharmacy at the number below.  Spoke with CJ, informed him of the message below and he stated he would have to check with the owner as they generally do not provide 75m.  CJ spoke with the owner, stated this was approved and he will contact the patient to inform her of this.  Message also sent via Mychart message.

## 2021-07-09 NOTE — Telephone Encounter (Signed)
I would think the easiest thing for them to do is to make a semaglutide 5 mg and Pyridoxine 10 mg/ ml solution to inject 1 ml weekly

## 2021-07-09 NOTE — Telephone Encounter (Signed)
CJ from [facility] called back to see if Dr.Fry would like to speak with the Pharmacist on the next steps for patient regarding upping the dose on the Semaglutide /Pyridoxine injection.    Good callback number is [phone]     Please advise

## 2021-07-09 NOTE — Telephone Encounter (Signed)
Please call her compounding pharmacy to increase the dose of her Semaglutide /Pyridoxine injection to 5 mg (from 2.5 mg) to inject 1 ml weekly, #4 doses. If they need a new rx we can fax one to them

## 2021-07-10 NOTE — Telephone Encounter (Signed)
Left a message for Nancy [name] to return my call.

## 2021-07-11 NOTE — Telephone Encounter (Signed)
I called the pharmacy and spoke with CJ.  CJ stated the previous dose for Semaglutide was confirmed and approved for 102m per ml; however the pharmacist stating this increase was too high as the dose the patient was receiving from the vial previously around 1.882minstead of 2.67m867m CJ stated the pharmacist recommended 2.67mg91mstead.  Message sent to PCP.

## 2021-07-12 NOTE — Telephone Encounter (Signed)
Spoke with CJ and informed him of the message below.  CJ stated the patient was taking 74 units which would equal 1.29m and he questioned what the next dose should be out of the 2.520mml vial?  Message sent to PCP and CJ also requested Dr FrSarajane Jewsall [phone] speak with the pharmacist.

## 2021-07-12 NOTE — Telephone Encounter (Signed)
I already answered this. Go up to the 2.5 mg dose (or 1 ml) weekly. I do not understand what the problem is

## 2021-07-12 NOTE — Telephone Encounter (Signed)
I understand. Ask them to use the 2.5 mg dose instead

## 2021-07-15 ENCOUNTER — Other Ambulatory Visit: Payer: Self-pay | Admitting: Family Medicine

## 2021-07-27 NOTE — Telephone Encounter (Signed)
Louise from [employer] is calling and would like to know if pt can come back to work part-time sitting down

## 2021-07-30 NOTE — Telephone Encounter (Signed)
This is a question that Cardiology needs to answer. She will see Dr. Haroldine Laws on 08-21-21, and I will let him decide about work

## 2021-07-31 ENCOUNTER — Other Ambulatory Visit: Payer: Self-pay | Admitting: Family Medicine

## 2021-08-03 NOTE — Telephone Encounter (Signed)
Dr Sarajane Jews advise was sent to pt via [facility]

## 2021-08-06 ENCOUNTER — Other Ambulatory Visit: Payer: Self-pay | Admitting: Family Medicine

## 2021-08-06 DIAGNOSIS — I2510 Atherosclerotic heart disease of native coronary artery without angina pectoris: Secondary | ICD-10-CM | POA: Diagnosis not present

## 2021-08-06 DIAGNOSIS — Z1231 Encounter for screening mammogram for malignant neoplasm of breast: Secondary | ICD-10-CM

## 2021-08-17 NOTE — Telephone Encounter (Signed)
Spoke with pt explained the message to pt verbalized understanding

## 2021-08-21 ENCOUNTER — Ambulatory Visit (HOSPITAL_COMMUNITY)
Admission: RE | Admit: 2021-08-21 | Discharge: 2021-08-21 | Disposition: A | Discharge status: Home / Self Care | Payer: 59 | Source: Ambulatory Visit | Attending: Internal Medicine | Admitting: Internal Medicine

## 2021-08-21 ENCOUNTER — Encounter (HOSPITAL_COMMUNITY): Payer: Self-pay | Admitting: Internal Medicine

## 2021-08-21 ENCOUNTER — Other Ambulatory Visit (HOSPITAL_COMMUNITY): Payer: Self-pay | Admitting: Internal Medicine

## 2021-08-21 VITALS — BP 120/78 | HR 68 | Wt 320.0 lb

## 2021-08-21 DIAGNOSIS — I10 Essential (primary) hypertension: Secondary | ICD-10-CM

## 2021-08-21 DIAGNOSIS — K219 Gastro-esophageal reflux disease without esophagitis: Secondary | ICD-10-CM | POA: Insufficient documentation

## 2021-08-21 DIAGNOSIS — R55 Syncope and collapse: Secondary | ICD-10-CM | POA: Diagnosis not present

## 2021-08-21 DIAGNOSIS — Z7982 Long term (current) use of aspirin: Secondary | ICD-10-CM | POA: Insufficient documentation

## 2021-08-21 DIAGNOSIS — Z6841 Body Mass Index (BMI) 40.0 and over, adult: Secondary | ICD-10-CM | POA: Insufficient documentation

## 2021-08-21 DIAGNOSIS — I5032 Chronic diastolic (congestive) heart failure: Secondary | ICD-10-CM | POA: Diagnosis not present

## 2021-08-21 DIAGNOSIS — Z79899 Other long term (current) drug therapy: Secondary | ICD-10-CM | POA: Insufficient documentation

## 2021-08-21 DIAGNOSIS — I493 Ventricular premature depolarization: Secondary | ICD-10-CM

## 2021-08-21 DIAGNOSIS — Z87891 Personal history of nicotine dependence: Secondary | ICD-10-CM | POA: Diagnosis not present

## 2021-08-21 DIAGNOSIS — R002 Palpitations: Secondary | ICD-10-CM | POA: Diagnosis not present

## 2021-08-21 DIAGNOSIS — I110 Hypertensive heart disease with heart failure: Secondary | ICD-10-CM | POA: Insufficient documentation

## 2021-08-21 DIAGNOSIS — I2510 Atherosclerotic heart disease of native coronary artery without angina pectoris: Secondary | ICD-10-CM | POA: Diagnosis not present

## 2021-08-21 DIAGNOSIS — R42 Dizziness and giddiness: Secondary | ICD-10-CM | POA: Insufficient documentation

## 2021-08-21 DIAGNOSIS — J449 Chronic obstructive pulmonary disease, unspecified: Secondary | ICD-10-CM | POA: Insufficient documentation

## 2021-08-21 NOTE — Progress Notes (Signed)
ADVANCED HF CLINIC CONSULT NOTE  Referring Physician: Laurey Morale, MD Primary Care: Laurey Morale, MD Primary Cardiologist: None   HPI:  Nancy [name] is a 66 y.o. female (mother of Yakima Kreitzer, CRNA) with morbid obesity (Body mass index is 50.12 kg/m.), non-obstructive CAD, HTN, HTN mild COPD, former smoker, and GERD. Referred by Dr. Sarajane Jews for further evaluation of her diastolic HF.   We saw her about 4 years ago. Echo 2019 EF 50-55% G1DD  She had a LHC in 2001 that showed no blockages. It was felt she had an atypical heart attack in setting of endometrial ablation. [place] 2009. Mild non-obstructive CAD  Admitted in 5/23 with flank pain. Found to have sats in 57s. Found to have volume overloaded. Started on lasix.   Echo 06/05/21 EF 55-60% G1DD. RV normal. TR insufficient to measure pulmonary pressures   CT angio of the chest showed no PE but it did showed ectasia of the main pulmonary artery and pulmonary edema.    She recently saw Dr. Sarajane Jews and had LE edema and presyncope and now referred back for HF evaluation. Has been having multiple episodes of orthostasis where she feels very lightheaded and presyncopal. Had an episode of syncope on Mother's Day. Her daughter checked orthostatics and BPs were stable in 120s. Has palpitations several times per week but don't correlate to symptoms. Takes lasix 80 daily. Occasional LE edema. No CP. Can do ADLs some days but other days limited by lightheadedness. Has been wearing O2 since May. Lost 30 pounds with Darcel Bayley    Review of Systems: [y] = yes, [ ]  = no   General: Weight gain [ ] ; Weight loss [ ] ; Anorexia [ ] ; Fatigue [ ] ; Fever [ ] ; Chills [ ] ; Weakness [ ]   Cardiac: Chest pain/pressure [ ] ; Resting SOB [ ] ; Exertional SOB Blue.Reese ]; Orthopnea [ ] ; Pedal Edema [ y]; Palpitations [ y]; Syncope [ y]; Presyncope [ y]; Paroxysmal nocturnal dyspnea[ ]   Pulmonary: Cough Blue.Reese ]; Wheezing[ ] ; Hemoptysis[ ] ; Sputum [ ] ; Snoring [ ]   GI: Vomiting[ ] ;  Dysphagia[ ] ; Melena[ ] ; Hematochezia [ ] ; Heartburn[ ] ; Abdominal pain [ ] ; Constipation [ ] ; Diarrhea [ ] ; BRBPR [ ]   GU: Hematuria[ ] ; Dysuria [ ] ; Nocturia[ ]   Vascular: Pain in legs with walking [ ] ; Pain in feet with lying flat [ ] ; Non-healing sores [ ] ; Stroke [ ] ; TIA [ ] ; Slurred speech [ ] ;  Neuro: Headaches[ ] ; Vertigo[ ] ; Seizures[ ] ; Paresthesias[ ] ;Blurred vision [ ] ; Diplopia [ ] ; Vision changes [ ]   Ortho/Skin: Arthritis [ y]; Joint pain Blue.Reese ]; Muscle pain [ ] ; Joint swelling [ ] ; Back Pain [ ] ; Rash [ ]   Psych: Depression[ ] ; Anxiety[ ]   Heme: Bleeding problems [ ] ; Clotting disorders [ ] ; Anemia [ ]   Endocrine: Diabetes [ ] ; Thyroid dysfunction[ ]    Past Medical History:  Diagnosis Date   Allergic rhinitis    gets shots per Dr. Harold Hedge    Anemia 2001   Anginal pain ([place])    Asthma    COPD (chronic obstructive pulmonary disease) ([place])    "CXR just showed mild COPD" (10/24/2011)   Coronary artery disease    had MI in 2001, seees Dr. Fletcher Anon at [facility]   GERD (gastroesophageal reflux disease)    Gynecological examination    sees Dr. Cherylann Banas    History of cardiac catheterization 07-24-07   showed nonconclusive disease   Hyperlipidemia    Hypertension  Hypothyroidism    Migraines    "have a history of migraines; haven't had one for years" (10/24/2011)   Myocardial infarction [facility]) 2001?   in her 60's    Current Outpatient Medications  Medication Sig Dispense Refill   albuterol (PROVENTIL) (2.5 MG/3ML) 0.083% nebulizer solution INHALE 3 MLS (ONE VIAL) BY NEBULIZATION EVERY 4 HOURS AS NEEDED FOR WHEEZING OR SHORTNESS OF BREATH. 75 mL 11   albuterol (VENTOLIN HFA) 108 (90 Base) MCG/ACT inhaler Inhale 2 puffs into the lungs every 4 (four) hours as needed for wheezing or shortness of breath. 18 g 5   aspirin 81 MG tablet Take 81 mg by mouth at bedtime.      atorvastatin (LIPITOR) 80 MG tablet TAKE 1 TABLET DAILY 90 tablet 0   calcitRIOL (ROCALTROL) 0.25  MCG capsule Take 1 capsule (0.25 mcg total) by mouth 2 (two) times daily. 180 capsule 3   calcium citrate (CALCITRATE) 950 (200 Ca) MG tablet Take 1 tablet (200 mg of elemental calcium total) by mouth 3 (three) times daily. 3 tablet 0   cetirizine (ZYRTEC) 10 MG tablet Take 10 mg by mouth daily.     diphenhydramine-acetaminophen (TYLENOL PM) 25-500 MG TABS tablet Take 1 tablet by mouth at bedtime.     escitalopram (LEXAPRO) 20 MG tablet TAKE 1 TABLET BY MOUTH EVERY DAY 90 tablet 1   furosemide (LASIX) 80 MG tablet Take 1 tablet (80 mg total) by mouth daily. 90 tablet 3   magnesium oxide (MAG-OX) 400 (240 Mg) MG tablet Take 400 mg by mouth 2 (two) times daily.     metoprolol tartrate (LOPRESSOR) 25 MG tablet TAKE 1 TABLET TWICE A DAY 180 tablet 0   montelukast (SINGULAIR) 10 MG tablet Take 1 tablet (10 mg total) by mouth at bedtime. 90 tablet 3   Multiple Vitamin (MULTIVITAMIN) tablet Take 1 tablet by mouth at bedtime.     omeprazole (PRILOSEC) 40 MG capsule TAKE 1 CAPSULE TWICE DAILY 180 capsule 0   OXYGEN Inhale 3 Doses into the lungs continuous.     spironolactone (ALDACTONE) 25 MG tablet TAKE 1 TABLET DAILY 90 tablet 0   SYNTHROID 200 MCG tablet TAKE 1 TABLET DAILY 90 tablet 0   temazepam (RESTORIL) 30 MG capsule Take 1 capsule (30 mg total) by mouth at bedtime as needed for sleep. 90 capsule 1   No current facility-administered medications for this encounter.    Allergies  Allergen Reactions   Sulfa Antibiotics Hives   Sulfamethoxazole Hives      Social History   Socioeconomic History   Marital status: Divorced    Spouse name: Not on file   Number of children: 1   Years of education: Not on file   Highest education level: Associate degree: occupational, Hotel manager, or vocational program  Occupational History   Occupation: respiratory therapist  Tobacco Use   Smoking status: Former    Packs/day: 1.00    Years: 30.00    Total pack years: 30.00    Types: Cigarettes    Quit  date: 10/24/2006    Years since quitting: 14.8   Smokeless tobacco: Never  Vaping Use   Vaping Use: Never used  Substance and Sexual Activity   Alcohol use: No    Alcohol/week: 0.0 standard drinks of alcohol   Drug use: No   Sexual activity: Never  Other Topics Concern   Not on file  Social History Narrative   Not on file   Social Determinants of Health   Financial  Resource Strain: Low Risk  (06/18/2021)   Overall Financial Resource Strain (CARDIA)    Difficulty of Paying Living Expenses: Not hard at all  Food Insecurity: No Food Insecurity (06/18/2021)   Hunger Vital Sign    Worried About Running Out of Food in the Last Year: Never true    Ran Out of Food in the Last Year: Never true  Transportation Needs: No Transportation Needs (06/18/2021)   PRAPARE - Hydrologist (Medical): No    Lack of Transportation (Non-Medical): No  Physical Activity: Unknown (06/18/2021)   Exercise Vital Sign    Days of Exercise per Week: 0 days    Minutes of Exercise per Session: Not on file  Stress: No Stress Concern Present (06/18/2021)   [place]    Feeling of Stress : Not at all  Social Connections: Unknown (06/18/2021)   Social Connection and Isolation Panel [NHANES]    Frequency of Communication with Friends and Family: Patient refused    Frequency of Social Gatherings with Friends and Family: Patient refused    Attends Religious Services: Never    Marine scientist or Organizations: No    Attends Music therapist: Not on file    Marital Status: Divorced  Human resources officer Violence: Not on file      Family History  Problem Relation Age of Onset   Pulmonary fibrosis Mother    COPD Mother    Breast cancer Mother        50's   Coronary artery disease Father    Hypertension Father    Cancer Other        breast,colon,prostate   Alcohol abuse Other    Hyperlipidemia Other     Hypertension Other    Stroke Other    Heart disease Other    COPD Other    Diabetes Other    Colon cancer Paternal Grandfather    Stomach cancer Other    Esophageal cancer Neg Hx    Rectal cancer Neg Hx     Vitals:   08/21/21 1435  BP: 120/78  Pulse: 68  SpO2: 94%  Weight: (!) 145.2 kg (320 lb)    Orhtostatics done personally  Sitting 152/98 Standing 158/104  PHYSICAL EXAM: General:  Sitting on exam table wearing O2  No respiratory difficulty HEENT: normal Neck: supple. no JVD. Carotids 2+ bilat; no bruits. No lymphadenopathy or thryomegaly appreciated. Cor: PMI nondisplaced. Regular rate & rhythm. No rubs, gallops or murmurs. Lungs: clear Abdomen: soft, nontender, nondistended. No hepatosplenomegaly. No bruits or masses. Good bowel sounds. Extremities: no cyanosis, clubbing, rash, edema Neuro: alert & oriented x 3, cranial nerves grossly intact. moves all 4 extremities w/o difficulty. Affect pleasant.  ECG: NSR 66 No ST-T wave abnormalities. Personally reviewed  REDS 35%   ASSESSMENT & PLAN:  1. Presyncope/syncope/palpitations - symptoms sound clearly orthostatic but BP readings do not confirm. May have some variation based on diuretic dosing but ReDS 35% today so volume status not on low end - Place compression hose - check Zio - Have her follow BP 2x/day and when symptomatic - At risk for PAH but echo does not support this. Can consider RHC down the road  2. Chronic diastolic HF - Echo 07/348 EF 50-55%, grade 1 DD - Echo 06/05/21 EF 55-60% G1DD RV ok  - Volume status ok today. REDS 35%   3. CAD - Last LHC 2009 and showed mild, nonobstructive CAD. -  Stress test 2013 with no ischemia - Continue ASA, statin, and BB - No s/s angina   4. HTN - Plan as above   4. Obesity - Body mass index is 50.12 kg/m. - Losing weight with [id number]  Glori Bickers, MD  2:47 PM

## 2021-08-21 NOTE — Progress Notes (Signed)
ReDS Vest / Clip - 08/21/21 1400       ReDS Vest / Clip   Station Marker D    Ruler Value 37.5    ReDS Value Range Low volume    ReDS Actual Value 35

## 2021-08-21 NOTE — Patient Instructions (Signed)
NO CHANGES TO MEDICATIONS.  Your provider has recommended that  you wear a Zio Patch for 14 days.  This monitor will record your heart rhythm for our review.  IF you have any symptoms while wearing the monitor please press the button.  If you have any issues with the patch or you notice a red or orange light on it please call the company at [phone].  Once you remove the patch please mail it back to the company as soon as possible so we can get the results.  PLEASE WEAR COMPRESSION HOSE EVERY DAY.  Take your blood pressure Twice daily and also when symptomatic   Your physician recommends that you schedule a follow-up appointment in: 2 months  If you have any questions or concerns before your next appointment please send [place] a message through [place] or call our office at [phone].    TO LEAVE A MESSAGE FOR THE NURSE SELECT OPTION 2, PLEASE LEAVE A MESSAGE INCLUDING: YOUR NAME DATE OF BIRTH CALL BACK NUMBER REASON FOR CALL**this is important as we prioritize the call backs  YOU WILL RECEIVE A CALL BACK THE SAME DAY AS LONG AS YOU CALL BEFORE 4:00 PM  At the [facility], you and your health needs are our priority. As part of our continuing mission to provide you with exceptional heart care, we have created designated Provider Care Teams. These Care Teams include your primary Cardiologist (physician) and Advanced Practice Providers (APPs- Physician Assistants and Nurse Practitioners) who all work together to provide you with the care you need, when you need it.   You may see any of the following providers on your designated Care Team at your next follow up: Dr Glori Bickers Dr Haynes Kerns, NP Lyda Jester, [place] [facility] [place], [place] Audry Riles, PharmD   Please be sure to bring in all your medications bottles to every appointment.

## 2021-08-29 ENCOUNTER — Encounter (HOSPITAL_COMMUNITY): Payer: Self-pay | Admitting: Internal Medicine

## 2021-08-29 ENCOUNTER — Telehealth (HOSPITAL_COMMUNITY): Payer: Self-pay | Admitting: Surgery

## 2021-08-29 NOTE — Telephone Encounter (Signed)
I called patient in reference to her My Chart message indicating that the Zio patch fell off and her issues with wearing it.  I will send [place] an email and request that they send a new device to her.  I have requested that she mail old device back in so that information can be retrieved.

## 2021-08-30 ENCOUNTER — Other Ambulatory Visit: Payer: Self-pay | Admitting: Family Medicine

## 2021-08-30 DIAGNOSIS — E039 Hypothyroidism, unspecified: Secondary | ICD-10-CM

## 2021-09-05 ENCOUNTER — Telehealth (INDEPENDENT_AMBULATORY_CARE_PROVIDER_SITE_OTHER): Payer: 59 | Admitting: Family Medicine

## 2021-09-05 ENCOUNTER — Encounter: Payer: Self-pay | Admitting: Family Medicine

## 2021-09-05 DIAGNOSIS — R69 Illness, unspecified: Secondary | ICD-10-CM | POA: Diagnosis not present

## 2021-09-05 DIAGNOSIS — M79604 Pain in right leg: Secondary | ICD-10-CM

## 2021-09-05 DIAGNOSIS — F1190 Opioid use, unspecified, uncomplicated: Secondary | ICD-10-CM

## 2021-09-05 DIAGNOSIS — M545 Low back pain, unspecified: Secondary | ICD-10-CM | POA: Diagnosis not present

## 2021-09-05 MED ORDER — HYDROCODONE-ACETAMINOPHEN 5-325 MG PO TABS: 1.0000 | ORAL_TABLET | Freq: Two times a day (BID) | ORAL | 0 refills | Status: AC | PRN

## 2021-09-05 MED ORDER — GABAPENTIN 100 MG PO CAPS: 100.0000 mg | ORAL_CAPSULE | Freq: Every day | ORAL | 0 refills | Status: DC

## 2021-09-05 NOTE — Progress Notes (Signed)
Subjective:    Patient ID: Nancy [name], female    DOB: 1955-06-03, 66 y.o.   MRN: [id number]  HPI Virtual Visit via Video Note  I connected with the patient on 09/05/21 at  1:00 PM EDT by a video enabled telemedicine application and verified that I am speaking with the correct person using two identifiers.  Location patient: home Location provider:work or home office Persons participating in the virtual visit: patient, provider  I discussed the limitations of evaluation and management by telemedicine and the availability of in person appointments. The patient expressed understanding and agreed to proceed.   HPI: Here for pain management. She says her back pain has worsened over time, and she asks if she could try Gabapentin. She has never had this before.    ROS: See pertinent positives and negatives per HPI.  Past Medical History:  Diagnosis Date   Allergic rhinitis    gets shots per Dr. Harold Hedge    Anemia 2001   Anginal pain ([place])    Asthma    COPD (chronic obstructive pulmonary disease) ([place])    "CXR just showed mild COPD" (10/24/2011)   Coronary artery disease    had MI in 2001, see[facility] Dr[facility] Fletcher Anon at [facility]   GERD (gastroesophageal reflux disease)    Gynecological examination    sees Dr. Cherylann Banas    History of cardiac catheterization 07-24-07   showed nonconclusive disease   Hyperlipidemia    Hypertension    Hypothyroidism    Migraines    "have a history of migraines; haven't had one for years" (10/24/2011)   Myocardial infarction [facility]) 2001?   in her 15's    Past Surgical History:  Procedure Laterality Date   CARDIAC CATHETERIZATION  2009   [place]   COLONOSCOPY  10-06-08   per Dr. Henrene Pastor, benign polyps, repeat in 10 yrs    ESOPHAGOGASTRODUODENOSCOPY  05/29/2017   per Dr. Henrene Pastor, normal except slight gastritis    HERNIA REPAIR  ~ 2007   ventral hernia repair   THYROIDECTOMY  1990's   TONSILLECTOMY     "when I was a kid"     Family History  Problem Relation Age of Onset   Pulmonary fibrosis Mother    COPD Mother    Breast cancer Mother        20's   Coronary artery disease Father    Hypertension Father    Cancer Other        breast,colon,prostate   Alcohol abuse Other    Hyperlipidemia Other    Hypertension Other    Stroke Other    Heart disease Other    COPD Other    Diabetes Other    Colon cancer Paternal Grandfather    Stomach cancer Other    Esophageal cancer Neg Hx    Rectal cancer Neg Hx      Current Outpatient Medications:    albuterol (PROVENTIL) (2.5 MG/3ML) 0.083% nebulizer solution, INHALE 3 MLS (ONE VIAL) BY NEBULIZATION EVERY 4 HOURS AS NEEDED FOR WHEEZING OR SHORTNESS OF BREATH., Disp: 75 mL, Rfl: 11   albuterol (VENTOLIN HFA) 108 (90 Base) MCG/ACT inhaler, Inhale 2 puffs into the lungs every 4 (four) hours as needed for wheezing or shortness of breath., Disp: 18 g, Rfl: 5   aspirin 81 MG tablet, Take 81 mg by mouth at bedtime. , Disp: , Rfl:    atorvastatin (LIPITOR) 80 MG tablet, TAKE 1 TABLET DAILY, Disp: 90 tablet, Rfl: 0  calcitRIOL (ROCALTROL) 0.25 MCG capsule, Take 1 capsule (0.25 mcg total) by mouth 2 (two) times daily., Disp: 180 capsule, Rfl: 3   calcium citrate (CALCITRATE) 950 (200 Ca) MG tablet, Take 1 tablet (200 mg of elemental calcium total) by mouth 3 (three) times daily., Disp: 3 tablet, Rfl: 0   cetirizine (ZYRTEC) 10 MG tablet, Take 10 mg by mouth daily., Disp: , Rfl:    diphenhydramine-acetaminophen (TYLENOL PM) 25-500 MG TABS tablet, Take 1 tablet by mouth at bedtime., Disp: , Rfl:    escitalopram (LEXAPRO) 20 MG tablet, TAKE 1 TABLET BY MOUTH EVERY DAY, Disp: 90 tablet, Rfl: 1   furosemide (LASIX) 80 MG tablet, Take 1 tablet (80 mg total) by mouth daily., Disp: 90 tablet, Rfl: 3   magnesium oxide (MAG-OX) 400 (240 Mg) MG tablet, Take 400 mg by mouth 2 (two) times daily., Disp: , Rfl:    metoprolol tartrate (LOPRESSOR) 25 MG tablet, TAKE 1 TABLET TWICE A DAY,  Disp: 180 tablet, Rfl: 0   montelukast (SINGULAIR) 10 MG tablet, Take 1 tablet (10 mg total) by mouth at bedtime., Disp: 90 tablet, Rfl: 3   Multiple Vitamin (MULTIVITAMIN) tablet, Take 1 tablet by mouth at bedtime., Disp: , Rfl:    omeprazole (PRILOSEC) 40 MG capsule, TAKE 1 CAPSULE TWICE DAILY, Disp: 180 capsule, Rfl: 0   OXYGEN, Inhale 3 Doses into the lungs continuous., Disp: , Rfl:    spironolactone (ALDACTONE) 25 MG tablet, TAKE 1 TABLET DAILY, Disp: 90 tablet, Rfl: 0   SYNTHROID 200 MCG tablet, TAKE 1 TABLET DAILY, Disp: 90 tablet, Rfl: 0   temazepam (RESTORIL) 30 MG capsule, Take 1 capsule (30 mg total) by mouth at bedtime as needed for sleep., Disp: 90 capsule, Rfl: 1  EXAM:  VITALS per patient if applicable:  GENERAL: alert, oriented, appears well and in no acute distress  HEENT: atraumatic, conjunttiva clear, no obvious abnormalities on inspection of external nose and ears  NECK: normal movements of the head and neck  LUNGS: on inspection no signs of respiratory distress, breathing rate appears normal, no obvious gross SOB, gasping or wheezing  CV: no obvious cyanosis  MS: moves all visible extremities without noticeable abnormality  PSYCH/NEURO: pleasant and cooperative, no obvious depression or anxiety, speech and thought processing grossly intact  ASSESSMENT AND PLAN: Pain management. Indication for chronic opioid: low back pain Medication and dose: Norco 5-325 # pills per month: 60 Last UDS date: 02-17-20 Opioid Treatment Agreement signed (Y/N): 01-21-19 Opioid Treatment Agreement last reviewed with patient:  09-05-21 [facility] reviewed this encounter (include red flags): Yes We wrote for another #60 of the Norco, and we will add Gabapentin 100 mg every night at bedtime. She will report back in 2 weeks.  Alysia Penna, MD  Discussed the following assessment and plan:  No diagnosis found.     I discussed the assessment and treatment plan with the patient. The  patient was provided an opportunity to ask questions and all were answered. The patient agreed with the plan and demonstrated an understanding of the instructions.   The patient was advised to call back or seek an in-person evaluation if the symptoms worsen or if the condition fails to improve as anticipated.      Review of Systems     Objective:   Physical Exam        Assessment & Plan:

## 2021-09-06 DIAGNOSIS — I2510 Atherosclerotic heart disease of native coronary artery without angina pectoris: Secondary | ICD-10-CM | POA: Diagnosis not present

## 2021-09-13 ENCOUNTER — Ambulatory Visit
Admission: RE | Admit: 2021-09-13 | Discharge: 2021-09-13 | Disposition: A | Discharge status: Home / Self Care | Payer: 59 | Source: Ambulatory Visit | Attending: Family Medicine | Admitting: Family Medicine

## 2021-09-13 DIAGNOSIS — Z1231 Encounter for screening mammogram for malignant neoplasm of breast: Secondary | ICD-10-CM

## 2021-09-24 DIAGNOSIS — I493 Ventricular premature depolarization: Secondary | ICD-10-CM | POA: Diagnosis not present

## 2021-09-24 NOTE — Addendum Note (Signed)
Encounter addended by: Micki Riley, RN on: 09/24/2021 11:03 AM  Actions taken: Imaging Exam ended

## 2021-10-02 ENCOUNTER — Other Ambulatory Visit (HOSPITAL_COMMUNITY): Payer: Self-pay

## 2021-10-03 ENCOUNTER — Other Ambulatory Visit (HOSPITAL_COMMUNITY): Payer: Self-pay

## 2021-10-07 DIAGNOSIS — I2510 Atherosclerotic heart disease of native coronary artery without angina pectoris: Secondary | ICD-10-CM | POA: Diagnosis not present

## 2021-10-09 DIAGNOSIS — L538 Other specified erythematous conditions: Secondary | ICD-10-CM | POA: Diagnosis not present

## 2021-10-09 DIAGNOSIS — L821 Other seborrheic keratosis: Secondary | ICD-10-CM | POA: Diagnosis not present

## 2021-10-09 DIAGNOSIS — L820 Inflamed seborrheic keratosis: Secondary | ICD-10-CM | POA: Diagnosis not present

## 2021-10-09 DIAGNOSIS — D492 Neoplasm of unspecified behavior of bone, soft tissue, and skin: Secondary | ICD-10-CM | POA: Diagnosis not present

## 2021-10-09 DIAGNOSIS — R208 Other disturbances of skin sensation: Secondary | ICD-10-CM | POA: Diagnosis not present

## 2021-10-09 LAB — HM DIABETES EYE EXAM

## 2021-10-11 ENCOUNTER — Encounter (HOSPITAL_COMMUNITY): Payer: Self-pay | Admitting: *Deleted

## 2021-10-11 NOTE — Progress Notes (Signed)
Received signed ROI from [facility] requesting records from 06/28/21 to 09/28/21, records faxed back to them at [phone]

## 2021-10-13 ENCOUNTER — Other Ambulatory Visit: Payer: Self-pay | Admitting: Family Medicine

## 2021-10-15 ENCOUNTER — Encounter (HOSPITAL_COMMUNITY): Payer: Self-pay | Admitting: Internal Medicine

## 2021-10-17 ENCOUNTER — Encounter: Payer: Self-pay | Admitting: Family Medicine

## 2021-10-17 ENCOUNTER — Telehealth: Payer: Self-pay | Admitting: Family Medicine

## 2021-10-17 NOTE — Telephone Encounter (Signed)
[phone] ext [phone]  Nancy [name] with [facility] Disability insurance   Calling to follow up on fax from 10/12/21 requesting medical records from 05/2021 to 09/2021.  She was also transferred to medical records [phone]

## 2021-10-18 ENCOUNTER — Encounter: Payer: Self-pay | Admitting: Family Medicine

## 2021-10-18 ENCOUNTER — Other Ambulatory Visit: Payer: Self-pay

## 2021-10-18 ENCOUNTER — Telehealth (INDEPENDENT_AMBULATORY_CARE_PROVIDER_SITE_OTHER): Payer: 59 | Admitting: Family Medicine

## 2021-10-18 DIAGNOSIS — M79604 Pain in right leg: Secondary | ICD-10-CM

## 2021-10-18 DIAGNOSIS — J209 Acute bronchitis, unspecified: Secondary | ICD-10-CM

## 2021-10-18 MED ORDER — GABAPENTIN 300 MG PO CAPS: 300.0000 mg | ORAL_CAPSULE | Freq: Two times a day (BID) | ORAL | 3 refills | Status: DC

## 2021-10-18 MED ORDER — METHYLPREDNISOLONE 4 MG PO TBPK
ORAL_TABLET | ORAL | 1 refills | Status: DC
Start: 2021-10-18 — End: 2021-10-26

## 2021-10-18 MED ORDER — HYDROCODONE BIT-HOMATROP MBR 5-1.5 MG/5ML PO SOLN: 5.0000 mL | ORAL | 0 refills | Status: DC | PRN

## 2021-10-18 MED ORDER — AZITHROMYCIN 250 MG PO TABS: ORAL_TABLET | ORAL | 0 refills | Status: DC

## 2021-10-18 NOTE — Progress Notes (Signed)
Subjective:    Patient ID: Nancy [name], female    DOB: 1955-03-26, 66 y.o.   MRN: [id number]  HPI Virtual Visit via Video Note  I connected with the patient on 10/18/21 at  9:30 AM EDT by a video enabled telemedicine application and verified that I am speaking with the correct person using two identifiers.  Location patient: home Location provider:work or home office Persons participating in the virtual visit: patient, provider  I discussed the limitations of evaluation and management by telemedicine and the availability of in person appointments. The patient expressed understanding and agreed to proceed.   HPI: Here for 4 days of chest tightness, coughing up yellow sputum, ST, and headache. No fever or chest pain. Using her nebulizer several times a day. She tested negative for the Covid virus 2 days ago.    ROS: See pertinent positives and negatives per HPI.  Past Medical History:  Diagnosis Date   Allergic rhinitis    gets shots per Dr. Harold Hedge    Anemia 2001   Anginal pain ([place])    Asthma    COPD (chronic obstructive pulmonary disease) ([place])    "CXR just showed mild COPD" (10/24/2011)   Coronary artery disease    had MI in 2001, seees Dr. Fletcher Anon at [facility]   GERD (gastroesophageal reflux disease)    Gynecological examination    sees Dr. Cherylann Banas    History of cardiac catheterization 07-24-07   showed nonconclusive disease   Hyperlipidemia    Hypertension    Hypothyroidism    Migraines    "have a history of migraines; haven't had one for years" (10/24/2011)   Myocardial infarction [facility]) 2001?   in her 21's    Past Surgical History:  Procedure Laterality Date   CARDIAC CATHETERIZATION  2009   CESAREAN SECTION  1984   COLONOSCOPY  10-06-08   per Dr. Henrene Pastor, benign polyps, repeat in 10 yrs    ESOPHAGOGASTRODUODENOSCOPY  05/29/2017   per Dr. Henrene Pastor, normal except slight gastritis    HERNIA REPAIR  ~ 2007   ventral hernia repair   THYROIDECTOMY  1990's    TONSILLECTOMY     "when I was a kid"    Family History  Problem Relation Age of Onset   Pulmonary fibrosis Mother    COPD Mother    Breast cancer Mother        26's   Coronary artery disease Father    Hypertension Father    Cancer Other        breast,colon,prostate   Alcohol abuse Other    Hyperlipidemia Other    Hypertension Other    Stroke Other    Heart disease Other    COPD Other    Diabetes Other    Colon cancer Paternal Grandfather    Stomach cancer Other    Esophageal cancer Neg Hx    Rectal cancer Neg Hx      Current Outpatient Medications:    azithromycin (ZITHROMAX Z-PAK) 250 MG tablet, As directed, Disp: 6 each, Rfl: 0   gabapentin (NEURONTIN) 300 MG capsule, Take 1 capsule (300 mg total) by mouth 2 (two) times daily., Disp: 90 capsule, Rfl: 3   HYDROcodone bit-homatropine (HYCODAN) 5-1.5 MG/5ML syrup, Take 5 mLs by mouth every 4 (four) hours as needed for cough., Disp: 240 mL, Rfl: 0   methylPREDNISolone (MEDROL DOSEPAK) 4 MG TBPK tablet, As directed, Disp: 21 tablet, Rfl: 1   albuterol (PROVENTIL) (2.5 MG/3ML) 0.083% nebulizer solution, INHALE 3  MLS (ONE VIAL) BY NEBULIZATION EVERY 4 HOURS AS NEEDED FOR WHEEZING OR SHORTNESS OF BREATH., Disp: 75 mL, Rfl: 11   albuterol (VENTOLIN HFA) 108 (90 Base) MCG/ACT inhaler, Inhale 2 puffs into the lungs every 4 (four) hours as needed for wheezing or shortness of breath., Disp: 18 g, Rfl: 5   aspirin 81 MG tablet, Take 81 mg by mouth at bedtime. , Disp: , Rfl:    atorvastatin (LIPITOR) 80 MG tablet, TAKE 1 TABLET DAILY, Disp: 90 tablet, Rfl: 0   calcitRIOL (ROCALTROL) 0.25 MCG capsule, Take 1 capsule (0.25 mcg total) by mouth 2 (two) times daily., Disp: 180 capsule, Rfl: 3   calcium citrate (CALCITRATE) 950 (200 Ca) MG tablet, Take 1 tablet (200 mg of elemental calcium total) by mouth 3 (three) times daily., Disp: 3 tablet, Rfl: 0   cetirizine (ZYRTEC) 10 MG tablet, Take 10 mg by mouth daily., Disp: , Rfl:     diphenhydramine-acetaminophen (TYLENOL PM) 25-500 MG TABS tablet, Take 1 tablet by mouth at bedtime., Disp: , Rfl:    escitalopram (LEXAPRO) 20 MG tablet, TAKE 1 TABLET BY MOUTH EVERY DAY, Disp: 90 tablet, Rfl: 1   furosemide (LASIX) 80 MG tablet, Take 1 tablet (80 mg total) by mouth daily., Disp: 90 tablet, Rfl: 3   gabapentin (NEURONTIN) 300 MG capsule, Take 1 capsule (300 mg total) by mouth 2 (two) times daily., Disp: 180 capsule, Rfl: 3   magnesium oxide (MAG-OX) 400 (240 Mg) MG tablet, Take 400 mg by mouth 2 (two) times daily., Disp: , Rfl:    metoprolol tartrate (LOPRESSOR) 25 MG tablet, TAKE 1 TABLET TWICE A DAY, Disp: 180 tablet, Rfl: 0   montelukast (SINGULAIR) 10 MG tablet, Take 1 tablet (10 mg total) by mouth at bedtime., Disp: 90 tablet, Rfl: 3   Multiple Vitamin (MULTIVITAMIN) tablet, Take 1 tablet by mouth at bedtime., Disp: , Rfl:    omeprazole (PRILOSEC) 40 MG capsule, TAKE 1 CAPSULE TWICE DAILY, Disp: 180 capsule, Rfl: 0   OXYGEN, Inhale 3 Doses into the lungs continuous., Disp: , Rfl:    spironolactone (ALDACTONE) 25 MG tablet, TAKE 1 TABLET DAILY, Disp: 90 tablet, Rfl: 0   SYNTHROID 200 MCG tablet, TAKE 1 TABLET DAILY, Disp: 90 tablet, Rfl: 0   temazepam (RESTORIL) 30 MG capsule, Take 1 capsule (30 mg total) by mouth at bedtime as needed for sleep., Disp: 90 capsule, Rfl: 1  EXAM:  VITALS per patient if applicable:  GENERAL: alert, oriented, appears well and in no acute distress  HEENT: atraumatic, conjunttiva clear, no obvious abnormalities on inspection of external nose and ears  NECK: normal movements of the head and neck  LUNGS: on inspection no signs of respiratory distress, breathing rate appears normal, no obvious gross SOB, gasping or wheezing  CV: no obvious cyanosis  MS: moves all visible extremities without noticeable abnormality  PSYCH/NEURO: pleasant and cooperative, no obvious depression or anxiety, speech and thought processing grossly  intact  ASSESSMENT AND PLAN: Bronchitis, treat with a Zpack and a Medrol dose pack. Recheck as needed.  Alysia Penna, MD   Discussed the following assessment and plan:  No diagnosis found.     I discussed the assessment and treatment plan with the patient. The patient was provided an opportunity to ask questions and all were answered. The patient agreed with the plan and demonstrated an understanding of the instructions.   The patient was advised to call back or seek an in-person evaluation if the symptoms worsen or if  the condition fails to improve as anticipated.      Review of Systems     Objective:   Physical Exam        Assessment & Plan:

## 2021-10-18 NOTE — Telephone Encounter (Signed)
I increased this to 300 mg BID

## 2021-10-19 NOTE — Telephone Encounter (Signed)
[place] paperwork was faxed several times with no success, Attempted to call Nancy [name] but voice message was full, will try later

## 2021-10-26 ENCOUNTER — Ambulatory Visit (HOSPITAL_COMMUNITY)
Admission: RE | Admit: 2021-10-26 | Discharge: 2021-10-26 | Disposition: A | Discharge status: Home / Self Care | Payer: 59 | Source: Ambulatory Visit | Attending: Internal Medicine | Admitting: Internal Medicine

## 2021-10-26 ENCOUNTER — Other Ambulatory Visit (HOSPITAL_COMMUNITY): Payer: Self-pay

## 2021-10-26 ENCOUNTER — Telehealth (HOSPITAL_COMMUNITY): Payer: Self-pay

## 2021-10-26 ENCOUNTER — Encounter (HOSPITAL_COMMUNITY): Payer: Self-pay | Admitting: Internal Medicine

## 2021-10-26 VITALS — BP 120/70 | HR 64 | Wt 308.0 lb

## 2021-10-26 DIAGNOSIS — J9611 Chronic respiratory failure with hypoxia: Secondary | ICD-10-CM | POA: Insufficient documentation

## 2021-10-26 DIAGNOSIS — I130 Hypertensive heart and chronic kidney disease with heart failure and stage 1 through stage 4 chronic kidney disease, or unspecified chronic kidney disease: Secondary | ICD-10-CM | POA: Insufficient documentation

## 2021-10-26 DIAGNOSIS — Z6841 Body Mass Index (BMI) 40.0 and over, adult: Secondary | ICD-10-CM | POA: Diagnosis not present

## 2021-10-26 DIAGNOSIS — N1832 Chronic kidney disease, stage 3b: Secondary | ICD-10-CM | POA: Insufficient documentation

## 2021-10-26 DIAGNOSIS — R55 Syncope and collapse: Secondary | ICD-10-CM | POA: Diagnosis not present

## 2021-10-26 DIAGNOSIS — Z7982 Long term (current) use of aspirin: Secondary | ICD-10-CM | POA: Diagnosis not present

## 2021-10-26 DIAGNOSIS — J449 Chronic obstructive pulmonary disease, unspecified: Secondary | ICD-10-CM | POA: Diagnosis not present

## 2021-10-26 DIAGNOSIS — I5032 Chronic diastolic (congestive) heart failure: Secondary | ICD-10-CM

## 2021-10-26 DIAGNOSIS — Z87891 Personal history of nicotine dependence: Secondary | ICD-10-CM | POA: Insufficient documentation

## 2021-10-26 DIAGNOSIS — I10 Essential (primary) hypertension: Secondary | ICD-10-CM | POA: Diagnosis not present

## 2021-10-26 DIAGNOSIS — R002 Palpitations: Secondary | ICD-10-CM | POA: Insufficient documentation

## 2021-10-26 DIAGNOSIS — Z79899 Other long term (current) drug therapy: Secondary | ICD-10-CM | POA: Insufficient documentation

## 2021-10-26 DIAGNOSIS — R634 Abnormal weight loss: Secondary | ICD-10-CM | POA: Diagnosis not present

## 2021-10-26 DIAGNOSIS — R42 Dizziness and giddiness: Secondary | ICD-10-CM

## 2021-10-26 DIAGNOSIS — K219 Gastro-esophageal reflux disease without esophagitis: Secondary | ICD-10-CM | POA: Insufficient documentation

## 2021-10-26 DIAGNOSIS — I252 Old myocardial infarction: Secondary | ICD-10-CM | POA: Diagnosis not present

## 2021-10-26 DIAGNOSIS — I2510 Atherosclerotic heart disease of native coronary artery without angina pectoris: Secondary | ICD-10-CM | POA: Insufficient documentation

## 2021-10-26 LAB — BRAIN NATRIURETIC PEPTIDE: B Natriuretic Peptide: 13.9 pg/mL (ref 0.0–100.0)

## 2021-10-26 LAB — BASIC METABOLIC PANEL
Anion gap: 10 (ref 5–15)
BUN: 23 mg/dL (ref 8–23)
CO2: 31 mmol/L (ref 22–32)
Calcium: 8.3 mg/dL — ABNORMAL LOW (ref 8.9–10.3)
Chloride: 99 mmol/L (ref 98–111)
Creatinine, Ser: 1.41 mg/dL — ABNORMAL HIGH (ref 0.44–1.00)
GFR, Estimated: 42 mL/min — ABNORMAL LOW (ref >60)
Glucose, Bld: 105 mg/dL — ABNORMAL HIGH (ref 70–99)
Potassium: 3.7 mmol/L (ref 3.5–5.1)
Sodium: 140 mmol/L (ref 135–145)

## 2021-10-26 MED ORDER — EMPAGLIFLOZIN 10 MG PO TABS
10.0000 mg | ORAL_TABLET | Freq: Every day | ORAL | 11 refills | Status: DC
Start: 2021-10-26 — End: 2022-05-21

## 2021-10-26 NOTE — Addendum Note (Signed)
Encounter addended by: Jerl Mina, RN on: 10/26/2021 10:45 AM  Actions taken: Letter saved

## 2021-10-26 NOTE — Telephone Encounter (Signed)
Advanced Heart Failure Patient Advocate Encounter  Prior Authorization for Nancy [name] has been approved.   Effective: 10/26/2021 to 10/27/2022  Test billing returns $0 copay.  Clista Bernhardt, CPhT Rx Patient Advocate Phone: [phone]

## 2021-10-26 NOTE — Telephone Encounter (Signed)
Advanced Heart Failure Patient Advocate Encounter  Prior authorization is required for Jardiance.  Submitted: 10/26/2021 Key Templeton, CPhT Rx Patient Advocate Phone: [phone]

## 2021-10-26 NOTE — Addendum Note (Signed)
Encounter addended by: Jerl Mina, RN on: 10/26/2021 10:30 AM  Actions taken: Diagnosis association updated, Order list changed, Medication long-term status modified, Charge Capture section accepted, Clinical Note Signed

## 2021-10-26 NOTE — Progress Notes (Signed)
ADVANCED HF CLINIC NOTE  Referring Physician: Laurey Morale, MD Primary Care: Laurey Morale, MD Primary Cardiologist: None   HPI:  Nancy [name] is a 66 y.o. female (mother of Marquasia Schmieder, CRNA) with morbid obesity (Body mass index is 48.24 kg/m.), non-obstructive CAD, HTN, HTN mild COPD, former smoker, and GERD. Referred by Dr. Sarajane Jews for further evaluation of her diastolic HF.   Echo 2019 EF 50-55% G1DD  She had a LHC in 2001 that showed no blockages. It was felt she had an atypical heart attack in setting of endometrial ablation. [place] 2009. Mild non-obstructive CAD  Admitted in 5/23 with flank pain. Found to have sats in 24s. Found to have volume overloaded. Started on lasix.   Echo 06/05/21 EF 55-60% G1DD. RV normal. TR insufficient to measure pulmonary pressures   CT angio of the chest showed no PE but it did showed ectasia of the main pulmonary artery and pulmonary edema.   Here for f/u. Still with some LE edema but improved with lasix. Hasn't required extra lasix. Get's around well with her 4LO2.  Sats occasionally go to mid 80s. No CP, orthopnea or PND. SBP 120-140. (Mostly 135-140) Still gets woozy/presyncopal on standing on occasion. Legs get wobbly.     Zio 7/23 Monitor 1 1. Sinus rhythm - avg HR of 68 bpm. 2. First Degree AV Block was present.  3. One run of supraventricular tachycardia occurred lasting 4 beats with a max rate of 129 bpm (avg 112  bpm).  4. Rare PACs and PVCs   Monitor 2 1. Sinus rhythm - avg HR of 69 bpm. Predominant underlying rhythm was Sinus Rhythm. ] 2. Three runs of nonsustained ventricular tachycardia occurred, the run with the fastest interval lasting 6 beats with a max rate of 135 bpm, the longest lasting 7 beats with an avg rate of 110 bpm. 3. Three runs of supraventricular tachycardia occurred, the run with the fastest interval lasting 4 beats with a max rate of 128 bpm, the longest lasting 8 beats with an avg rate of 104 bpm. 4. Rare  PACs and PVCs   Past Medical History:  Diagnosis Date   Allergic rhinitis    gets shots per Dr. Harold Hedge    Anemia 2001   Anginal pain ([place])    Asthma    COPD (chronic obstructive pulmonary disease) ([place])    "CXR just showed mild COPD" (10/24/2011)   Coronary artery disease    had MI in 2001, see[facility]. Fletcher [facility] at [facility]   GERD (gastroesophageal reflux disease)    Gynecological examination    sees Dr. Cherylann Banas    History of cardiac catheterization 07-24-07   showed nonconclusive disease   Hyperlipidemia    Hypertension    Hypothyroidism    Migraines    "have a history of migraines; haven't had one for years" (10/24/2011)   Myocardial infarction [facility]) 2001?   in her 70's    Current Outpatient Medications  Medication Sig Dispense Refill   albuterol (PROVENTIL) (2.5 MG/3ML) 0.083% nebulizer solution INHALE 3 MLS (ONE VIAL) BY NEBULIZATION EVERY 4 HOURS AS NEEDED FOR WHEEZING OR SHORTNESS OF BREATH. 75 mL 11   albuterol (VENTOLIN HFA) 108 (90 Base) MCG/ACT inhaler Inhale 2 puffs into the lungs every 4 (four) hours as needed for wheezing or shortness of breath. 18 g 5   aspirin 81 MG tablet Take 81 mg by mouth at bedtime.      atorvastatin (LIPITOR) 80 MG tablet TAKE 1  TABLET DAILY 90 tablet 0   calcitRIOL (ROCALTROL) 0.25 MCG capsule Take 1 capsule (0.25 mcg total) by mouth 2 (two) times daily. 180 capsule 3   calcium citrate (CALCITRATE) 950 (200 Ca) MG tablet Take 1 tablet (200 mg of elemental calcium total) by mouth 3 (three) times daily. 3 tablet 0   cetirizine (ZYRTEC) 10 MG tablet Take 10 mg by mouth daily.     diphenhydramine-acetaminophen (TYLENOL PM) 25-500 MG TABS tablet Take 1 tablet by mouth at bedtime. As needed     escitalopram (LEXAPRO) 20 MG tablet TAKE 1 TABLET BY MOUTH EVERY DAY 90 tablet 1   furosemide (LASIX) 80 MG tablet Take 1 tablet (80 mg total) by mouth daily. 90 tablet 3   gabapentin (NEURONTIN) 300 MG capsule Take 1 capsule (300 mg total) by  mouth 2 (two) times daily. 180 capsule 3   HYDROcodone bit-homatropine (HYCODAN) 5-1.5 MG/5ML syrup Take 5 mLs by mouth every 4 (four) hours as needed for cough. 240 mL 0   magnesium oxide (MAG-OX) 400 (240 Mg) MG tablet Take 400 mg by mouth 2 (two) times daily.     metoprolol tartrate (LOPRESSOR) 25 MG tablet TAKE 1 TABLET TWICE A DAY 180 tablet 0   montelukast (SINGULAIR) 10 MG tablet Take 1 tablet (10 mg total) by mouth at bedtime. 90 tablet 3   Multiple Vitamin (MULTIVITAMIN) tablet Take 1 tablet by mouth at bedtime.     omeprazole (PRILOSEC) 40 MG capsule TAKE 1 CAPSULE TWICE DAILY 180 capsule 0   OXYGEN Inhale 3-4 L into the lungs continuous.     spironolactone (ALDACTONE) 25 MG tablet TAKE 1 TABLET DAILY 90 tablet 0   SYNTHROID 200 MCG tablet TAKE 1 TABLET DAILY 90 tablet 0   temazepam (RESTORIL) 30 MG capsule Take 1 capsule (30 mg total) by mouth at bedtime as needed for sleep. 90 capsule 1   No current facility-administered medications for this encounter.    Allergies  Allergen Reactions   Sulfa Antibiotics Hives   Sulfamethoxazole Hives      Social History   Socioeconomic History   Marital status: Divorced    Spouse name: Not on file   Number of children: 1   Years of education: Not on file   Highest education level: Associate degree: occupational, Hotel manager, or vocational program  Occupational History   Occupation: respiratory therapist  Tobacco Use   Smoking status: Former    Packs/day: 1.00    Years: 30.00    Total pack years: 30.00    Types: Cigarettes    Quit date: 10/24/2006    Years since quitting: 15.0   Smokeless tobacco: Never  Vaping Use   Vaping Use: Never used  Substance and Sexual Activity   Alcohol use: No    Alcohol/week: 0.0 standard drinks of alcohol   Drug use: No   Sexual activity: Never  Other Topics Concern   Not on file  Social History Narrative   Not on file   Social Determinants of Health   Financial Resource Strain: Low Risk   (06/18/2021)   Overall Financial Resource Strain (CARDIA)    Difficulty of Paying Living Expenses: Not hard at all  Food Insecurity: No Food Insecurity (06/18/2021)   Hunger Vital Sign    Worried About Running Out of Food in the Last Year: Never true    [place] in the Last Year: Never true  Transportation Needs: No Transportation Needs (06/18/2021)   PRAPARE - Transportation  Lack of Transportation (Medical): No    Lack of Transportation (Non-Medical): No  Physical Activity: Unknown (06/18/2021)   Exercise Vital Sign    Days of Exercise per Week: 0 days    Minutes of Exercise per Session: Not on file  Stress: No Stress Concern Present (06/18/2021)   [place]    Feeling of Stress : Not at all  Social Connections: Unknown (06/18/2021)   Social Connection and Isolation Panel [NHANES]    Frequency of Communication with Friends and Family: Patient refused    Frequency of Social Gatherings with Friends and Family: Patient refused    Attends Religious Services: Never    Marine scientist or Organizations: No    Attends Music therapist: Not on file    Marital Status: Divorced  Human resources officer Violence: Not on file      Family History  Problem Relation Age of Onset   Pulmonary fibrosis Mother    COPD Mother    Breast cancer Mother        50's   Coronary artery disease Father    Hypertension Father    Cancer Other        breast,colon,prostate   Alcohol abuse Other    Hyperlipidemia Other    Hypertension Other    Stroke Other    Heart disease Other    COPD Other    Diabetes Other    Colon cancer Paternal Grandfather    Stomach cancer Other    Esophageal cancer Neg Hx    Rectal cancer Neg Hx     Vitals:   10/26/21 0941  BP: 120/70  Pulse: 64  SpO2: 98%  Weight: (!) 139.7 kg (308 lb)   Body mass index is 48.24 kg/m.  PHYSICAL EXAM: General:  Well appearing. No resp  difficulty HEENT: normal + O2 Neck: supple. no JVD. Carotids 2+ bilat; no bruits. No lymphadenopathy or thryomegaly appreciated. Cor: PMI nondisplaced. Regular rate & rhythm. No rubs, gallops or murmurs. Lungs: clear Abdomen: obese soft, nontender, nondistended. No hepatosplenomegaly. No bruits or masses. Good bowel sounds. Extremities: no cyanosis, clubbing, rash, tr edema Neuro: alert & orientedx3, cranial nerves grossly intact. moves all 4 extremities w/o difficulty. Affect pleasant    ASSESSMENT & PLAN:  1. Presyncope/syncope/palpitations - symptoms sound clearly orthostatic but BP readings do not confirm. May have some variation based on volume status - Encouraged compression compression hose - Zio 5/23 ok - reassuring  2. Chronic diastolic HF - Echo 08/6759 EF 50-55%, grade 1 DD - Echo 06/05/21 EF 55-60% G1DD RV ok  - Volume status ok today.  - Switch lasix to Jardiance    3. CAD - Last LHC 2009 and showed mild, nonobstructive CAD. - Stress test 2013 with no ischemia - Continue ASA, statin, and BB - Continues with exertional dyspnea. Plan R/L cath as below   4. HTN -Ideally would like her to be SBP 120-130 but with orthostasis and frequent presyncope will tolerate 130-140. Continue to follow   5. Obesity - Body mass index is 48.24 kg/m. - Losing weight with GLP1RA (Ozempic)  6. CKD 3b - start jardiance  7. Chronic hypoxic respiratory failure - concern for PAH - will plan R/L HC  Glori Bickers, MD  10:02 AM

## 2021-10-26 NOTE — Patient Instructions (Signed)
Stop Lasix  Start Jardiance 4m daily  Labs done today, your results will be available in MyChart, we will contact you for abnormal readings.  PLEASE CALL SO WE CAN SCHEDULE YOUR CATH ONCE YOU HAVE SPOKEN WITH YOUR DAUGHTER.  Your physician recommends that you schedule a follow-up appointment in: 6 months ( March 2024)  ** please call the office in January to arrange your follow up appointment **  If you have any questions or concerns before your next appointment please send [place] message through [place] call our office at [phone]    TO LEAVE A MESSAGE FOR THE NURSE SELECT OPTION 2, PLEASE LEAVE A MESSAGE INCLUDING: YOUR NAME DATE OF BIRTH CALL BACK NUMBER REASON FOR CALL**this is important as we prioritize the call backs  YOU WILL RECEIVE A CALL BACK THE SAME DAY AS LONG AS YOU CALL BEFORE 4:00 PM  At the [facility] you and your health needs are our priority. As part of our continuing mission to provide you with exceptional heart care, we have created designated Provider Care Teams. These Care Teams include your primary Cardiologist (physician) and Advanced Practice Providers (APPs- Physician Assistants and Nurse Practitioners) who all work together to provide you with the care you need, when you need it.   You may see any of the following providers on your designated Care Team at your next follow up: Dr DGlori BickersDr DLoralie ChampagneDr. ARoxana Hires NP BLyda Jester PUtahJBanner Churchill Community HospitalLBelleville PUtahAForestine Na NP LAudry Riles PharmD   Please be sure to bring in all your medications bottles to every appointment.

## 2021-11-02 ENCOUNTER — Other Ambulatory Visit: Payer: Self-pay | Admitting: Family Medicine

## 2021-11-02 DIAGNOSIS — E039 Hypothyroidism, unspecified: Secondary | ICD-10-CM

## 2021-11-05 ENCOUNTER — Encounter: Payer: Self-pay | Admitting: Family Medicine

## 2021-11-05 NOTE — Telephone Encounter (Signed)
Please refill the Semaglutide for 6 months

## 2021-11-05 NOTE — Telephone Encounter (Signed)
Please refill this for 6 months

## 2021-11-06 ENCOUNTER — Other Ambulatory Visit: Payer: Self-pay

## 2021-11-06 ENCOUNTER — Telehealth: Payer: Self-pay | Admitting: Family Medicine

## 2021-11-06 DIAGNOSIS — I2510 Atherosclerotic heart disease of native coronary artery without angina pectoris: Secondary | ICD-10-CM | POA: Diagnosis not present

## 2021-11-06 DIAGNOSIS — E669 Obesity, unspecified: Secondary | ICD-10-CM

## 2021-11-06 MED ORDER — SEMAGLUTIDE(0.25 OR 0.5MG/DOS) 2 MG/1.5ML ~~LOC~~ SOPN: 2.5000 mg | PEN_INJECTOR | SUBCUTANEOUS | 2 refills | Status: DC

## 2021-11-06 NOTE — Telephone Encounter (Signed)
Nancy [name]  with [facility] is  calling and the rx forSemaglutide,0.25 or 0.5MG/DOS, 2 MG/1.5ML SOPN is not correct they are compound pharm and it needs to say semaglutide 2.5 mg pryidoxine 10 mg. The rx is also in s-drive folder

## 2021-11-06 NOTE — Telephone Encounter (Signed)
Previous refill I sent was incorrect.   Please assist.  Thanks

## 2021-11-06 NOTE — Telephone Encounter (Signed)
Please call this in according to her directions

## 2021-11-07 ENCOUNTER — Encounter: Payer: Self-pay | Admitting: Family Medicine

## 2021-11-07 ENCOUNTER — Telehealth (HOSPITAL_COMMUNITY): Payer: Self-pay | Admitting: *Deleted

## 2021-11-07 DIAGNOSIS — E669 Obesity, unspecified: Secondary | ICD-10-CM

## 2021-11-07 NOTE — Telephone Encounter (Signed)
Cath auth request faxed to [employer]

## 2021-11-08 ENCOUNTER — Telehealth (HOSPITAL_COMMUNITY): Payer: Self-pay

## 2021-11-08 NOTE — Telephone Encounter (Signed)
Spoke with patient and informed her tha Dr. Sarajane Jews her PCP will have to keep filling out her forms till after her Heart Catheterization on 11/06. As it stands now we are unable to fill them until after we get the result of the cath.

## 2021-11-08 NOTE — Telephone Encounter (Signed)
I sent in a for 3 month supply 2 days ago

## 2021-11-09 MED ORDER — SEMAGLUTIDE(0.25 OR 0.5MG/DOS) 2 MG/1.5ML ~~LOC~~ SOPN: 2.5000 mg | PEN_INJECTOR | SUBCUTANEOUS | 2 refills | Status: DC

## 2021-11-09 NOTE — Telephone Encounter (Signed)
[facility] called to say nothing has come through yet via escribe, and is also asking that sender also ensure the correct spelling of Patient's name.

## 2021-11-09 NOTE — Telephone Encounter (Signed)
We are faxing over the RX

## 2021-11-09 NOTE — Telephone Encounter (Signed)
Nancy [name] is calling and the rx is still not correct it needs to said semaglutide 2.5 mg pryidoxine 10 mg.

## 2021-11-09 NOTE — Telephone Encounter (Signed)
I just sent it in again

## 2021-11-12 NOTE — Telephone Encounter (Signed)
Amy with [facility] is aware we will fax over the rx

## 2021-11-13 NOTE — Telephone Encounter (Signed)
Pt Rx was faxed successfully, confirmation received

## 2021-11-16 ENCOUNTER — Encounter: Payer: Self-pay | Admitting: Family Medicine

## 2021-11-20 ENCOUNTER — Other Ambulatory Visit: Payer: Self-pay | Admitting: Family Medicine

## 2021-11-20 DIAGNOSIS — E039 Hypothyroidism, unspecified: Secondary | ICD-10-CM

## 2021-11-21 NOTE — Telephone Encounter (Signed)
Nancy [name] make a lab appt so we can check a BMET and an ionized calcium to see exactly where she is. In the meantime Nancy [name] take 2000 mg of calcium a day

## 2021-11-22 ENCOUNTER — Other Ambulatory Visit: Payer: 59

## 2021-11-22 NOTE — Telephone Encounter (Signed)
Pt is aware.  

## 2021-11-23 LAB — BASIC METABOLIC PANEL
BUN/Creatinine Ratio: 10 (calc) (ref 6–22)
BUN: 19 mg/dL (ref 7–25)
CO2: 33 mmol/L — ABNORMAL HIGH (ref 20–32)
Calcium: 9.4 mg/dL (ref 8.6–10.4)
Chloride: 95 mmol/L — ABNORMAL LOW (ref 98–110)
Creat: 1.81 mg/dL — ABNORMAL HIGH (ref 0.50–1.05)
Glucose, Bld: 112 mg/dL — ABNORMAL HIGH (ref 65–99)
Potassium: 3.7 mmol/L (ref 3.5–5.3)
Sodium: 141 mmol/L (ref 135–146)

## 2021-11-23 LAB — CALCIUM, IONIZED: Calcium, Ion: 4.7 mg/dL (ref 4.7–5.5)

## 2021-11-28 ENCOUNTER — Telehealth (HOSPITAL_COMMUNITY): Payer: Self-pay

## 2021-11-28 NOTE — Telephone Encounter (Signed)
Called patient as requested. Patient states that her insurance needs additional information for  catheterization. Jasmine, authorization coordinator made aware. Information re faxed. Patient gave phone number to this RN. Jasmine to call tomorrow and will update patient.

## 2021-11-30 ENCOUNTER — Encounter: Payer: Self-pay | Admitting: Family Medicine

## 2021-11-30 ENCOUNTER — Telehealth (INDEPENDENT_AMBULATORY_CARE_PROVIDER_SITE_OTHER): Payer: 59 | Admitting: Family Medicine

## 2021-11-30 VITALS — Temp 100.2°F | Ht 67.0 in | Wt 303.0 lb

## 2021-11-30 DIAGNOSIS — J440 Chronic obstructive pulmonary disease with acute lower respiratory infection: Secondary | ICD-10-CM

## 2021-11-30 DIAGNOSIS — J209 Acute bronchitis, unspecified: Secondary | ICD-10-CM

## 2021-11-30 MED ORDER — AMOXICILLIN-POT CLAVULANATE 875-125 MG PO TABS: 1.0000 | ORAL_TABLET | Freq: Two times a day (BID) | ORAL | 0 refills | Status: DC

## 2021-11-30 NOTE — Progress Notes (Signed)
Subjective:    Patient ID: Nancy [name], female    DOB: 14-Aug-1955, 66 y.o.   MRN: [id number]  HPI Virtual Visit via Video Note  I connected with the patient on 11/30/21 at 10:30 AM EDT by a video enabled telemedicine application and verified that I am speaking with the correct person using two identifiers.  Location patient: home Location provider:work or home office Persons participating in the virtual visit: patient, provider  I discussed the limitations of evaluation and management by telemedicine and the availability of in person appointments. The patient expressed understanding and agreed to proceed.   HPI: Here for 4 days of fever to 100.2 degrees, stuffy head, ST, chest congestion and coughing up green sputum. No chest pain. She has some SOB but is managing this by using her nebulizer. No NVD. She is scheduled for a cardiac cath on Monday. She had a taper pack of Prednisone at home, and she has taken 3 days of this.    ROS: See pertinent positives and negatives per HPI.  Past Medical History:  Diagnosis Date   Allergic rhinitis    gets shots per Dr. Harold Hedge    Anemia 2001   Anginal pain ([place])    Asthma    COPD (chronic obstructive pulmonary disease) ([place])    "CXR just showed mild COPD" (10/24/2011)   Coronary artery disease    had MI in 2001, see[facility] Dr. Fletcher Anon at [facility]   GERD (gastroesophageal reflux disease)    Gynecological examination    sees Dr. Cherylann Banas    History of cardiac catheterization 07-24-07   showed nonconclusive disease   Hyperlipidemia    Hypertension    Hypothyroidism    Migraines    "have a history of migraines; haven't had one for years" (10/24/2011)   Myocardial infarction [facility]) 2001?   in her 20's    Past Surgical History:  Procedure Laterality Date   CARDIAC CATHETERIZATION  2009   [place]   COLONOSCOPY  10-06-08   per Dr. Henrene Pastor, benign polyps, repeat in 10 yrs    ESOPHAGOGASTRODUODENOSCOPY  05/29/2017    per Dr. Henrene Pastor, normal except slight gastritis    HERNIA REPAIR  ~ 2007   ventral hernia repair   THYROIDECTOMY  1990's   TONSILLECTOMY     "when I was a kid"    Family History  Problem Relation Age of Onset   Pulmonary fibrosis Mother    COPD Mother    Breast cancer Mother        56's   Coronary artery disease Father    Hypertension Father    Cancer Other        breast,colon,prostate   Alcohol abuse Other    Hyperlipidemia Other    Hypertension Other    Stroke Other    Heart disease Other    COPD Other    Diabetes Other    Colon cancer Paternal Grandfather    Stomach cancer Other    Esophageal cancer Neg Hx    Rectal cancer Neg Hx      Current Outpatient Medications:    albuterol (PROVENTIL) (2.5 MG/3ML) 0.083% nebulizer solution, INHALE 3 MLS (ONE VIAL) BY NEBULIZATION EVERY 4 HOURS AS NEEDED FOR WHEEZING OR SHORTNESS OF BREATH., Disp: 75 mL, Rfl: 11   albuterol (VENTOLIN HFA) 108 (90 Base) MCG/ACT inhaler, Inhale 2 puffs into the lungs every 4 (four) hours as needed for wheezing or shortness of breath., Disp: 18 g, Rfl: 5  amoxicillin-clavulanate (AUGMENTIN) 875-125 MG tablet, Take 1 tablet by mouth 2 (two) times daily., Disp: 20 tablet, Rfl: 0   aspirin 81 MG tablet, Take 81 mg by mouth at bedtime. , Disp: , Rfl:    atorvastatin (LIPITOR) 80 MG tablet, TAKE 1 TABLET DAILY, Disp: 90 tablet, Rfl: 0   calcitRIOL (ROCALTROL) 0.25 MCG capsule, TAKE 1 CAPSULE TWICE DAILY, Disp: 180 capsule, Rfl: 3   calcium citrate (CALCITRATE) 950 (200 Ca) MG tablet, Take 1 tablet (200 mg of elemental calcium total) by mouth 3 (three) times daily. (Patient taking differently: Take 200-400 mg of elemental calcium by mouth See admin instructions. 200 mg in the morning, 400 mg in the evening), Disp: 3 tablet, Rfl: 0   cetirizine (ZYRTEC) 10 MG tablet, Take 10 mg by mouth daily., Disp: , Rfl:    empagliflozin (JARDIANCE) 10 MG TABS tablet, Take 1 tablet (10 mg total) by mouth daily before  breakfast., Disp: 30 tablet, Rfl: 11   escitalopram (LEXAPRO) 20 MG tablet, TAKE 1 TABLET BY MOUTH EVERY DAY, Disp: 90 tablet, Rfl: 1   furosemide (LASIX) 80 MG tablet, Take 80 mg by mouth daily., Disp: , Rfl:    gabapentin (NEURONTIN) 300 MG capsule, Take 1 capsule (300 mg total) by mouth 2 (two) times daily., Disp: 180 capsule, Rfl: 3   HYDROcodone-acetaminophen (NORCO/VICODIN) 5-325 MG tablet, Take 1 tablet by mouth every 4 (four) hours as needed for moderate pain., Disp: , Rfl:    magnesium oxide (MAG-OX) 400 (240 Mg) MG tablet, Take 400 mg by mouth 2 (two) times daily., Disp: , Rfl:    methylPREDNISolone (MEDROL DOSEPAK) 4 MG TBPK tablet, Take by mouth., Disp: , Rfl:    metoprolol tartrate (LOPRESSOR) 25 MG tablet, TAKE 1 TABLET TWICE A DAY, Disp: 180 tablet, Rfl: 0   montelukast (SINGULAIR) 10 MG tablet, Take 1 tablet (10 mg total) by mouth at bedtime., Disp: 90 tablet, Rfl: 3   Multiple Vitamin (MULTIVITAMIN) tablet, Take 1 tablet by mouth at bedtime., Disp: , Rfl:    omeprazole (PRILOSEC) 40 MG capsule, TAKE 1 CAPSULE TWICE DAILY, Disp: 180 capsule, Rfl: 0   OXYGEN, Inhale 3-4 L into the lungs continuous., Disp: , Rfl:    Semaglutide,0.25 or 0.5MG/DOS, 2 MG/1.5ML SOPN, Inject 2.5 mg into the skin once a week. (Patient taking differently: Inject 0.65 mg into the skin every Saturday.), Disp: 1.5 mL, Rfl: 2   spironolactone (ALDACTONE) 25 MG tablet, TAKE 1 TABLET DAILY, Disp: 90 tablet, Rfl: 0   SYNTHROID 200 MCG tablet, TAKE 1 TABLET DAILY, Disp: 90 tablet, Rfl: 0   temazepam (RESTORIL) 30 MG capsule, Take 1 capsule (30 mg total) by mouth at bedtime as needed for sleep. (Patient taking differently: Take 30 mg by mouth at bedtime.), Disp: 90 capsule, Rfl: 1  EXAM:  VITALS per patient if applicable:  GENERAL: alert, oriented, appears well and in no acute distress  HEENT: atraumatic, conjunttiva clear, no obvious abnormalities on inspection of external nose and ears  NECK: normal  movements of the head and neck  LUNGS: she has a deep frequent cough, breathing rate appears normal, no obvious gross SOB, gasping or wheezing  CV: no obvious cyanosis  MS: moves all visible extremities without noticeable abnormality  PSYCH/NEURO: pleasant and cooperative, no obvious depression or anxiety, speech and thought processing grossly intact  ASSESSMENT AND PLAN: Bronchitis, treat with 10  days of Augmentin. Finish up the steroid pack. I advised her to let her cardiologist knw about this infection,  because they will likely want to postpone the cath procedure. Alysia Penna, MD  Discussed the following assessment and plan:  No diagnosis found.     I discussed the assessment and treatment plan with the patient. The patient was provided an opportunity to ask questions and all were answered. The patient agreed with the plan and demonstrated an understanding of the instructions.   The patient was advised to call back or seek an in-person evaluation if the symptoms worsen or if the condition fails to improve as anticipated.      Review of Systems     Objective:   Physical Exam        Assessment & Plan:

## 2021-12-03 ENCOUNTER — Other Ambulatory Visit: Payer: Self-pay

## 2021-12-03 ENCOUNTER — Encounter (HOSPITAL_COMMUNITY)
Admission: RE | Disposition: A | Discharge status: Home / Self Care | Payer: Self-pay | Source: Home / Self Care | Attending: Internal Medicine

## 2021-12-03 ENCOUNTER — Ambulatory Visit (HOSPITAL_COMMUNITY)
Admission: RE | Admit: 2021-12-03 | Discharge: 2021-12-03 | Disposition: A | Discharge status: Home / Self Care | Payer: 59 | Attending: Internal Medicine | Admitting: Internal Medicine

## 2021-12-03 ENCOUNTER — Encounter: Payer: Self-pay | Admitting: Family Medicine

## 2021-12-03 DIAGNOSIS — I130 Hypertensive heart and chronic kidney disease with heart failure and stage 1 through stage 4 chronic kidney disease, or unspecified chronic kidney disease: Secondary | ICD-10-CM | POA: Insufficient documentation

## 2021-12-03 DIAGNOSIS — J9611 Chronic respiratory failure with hypoxia: Secondary | ICD-10-CM | POA: Diagnosis not present

## 2021-12-03 DIAGNOSIS — Z7982 Long term (current) use of aspirin: Secondary | ICD-10-CM | POA: Insufficient documentation

## 2021-12-03 DIAGNOSIS — I2510 Atherosclerotic heart disease of native coronary artery without angina pectoris: Secondary | ICD-10-CM | POA: Diagnosis not present

## 2021-12-03 DIAGNOSIS — R002 Palpitations: Secondary | ICD-10-CM | POA: Diagnosis not present

## 2021-12-03 DIAGNOSIS — I5032 Chronic diastolic (congestive) heart failure: Secondary | ICD-10-CM | POA: Diagnosis not present

## 2021-12-03 DIAGNOSIS — Z6841 Body Mass Index (BMI) 40.0 and over, adult: Secondary | ICD-10-CM | POA: Diagnosis not present

## 2021-12-03 DIAGNOSIS — Z79899 Other long term (current) drug therapy: Secondary | ICD-10-CM | POA: Insufficient documentation

## 2021-12-03 DIAGNOSIS — N1832 Chronic kidney disease, stage 3b: Secondary | ICD-10-CM | POA: Insufficient documentation

## 2021-12-03 DIAGNOSIS — I272 Pulmonary hypertension, unspecified: Secondary | ICD-10-CM

## 2021-12-03 DIAGNOSIS — I2721 Secondary pulmonary arterial hypertension: Secondary | ICD-10-CM | POA: Diagnosis not present

## 2021-12-03 DIAGNOSIS — R55 Syncope and collapse: Secondary | ICD-10-CM | POA: Diagnosis not present

## 2021-12-03 DIAGNOSIS — R0789 Other chest pain: Secondary | ICD-10-CM

## 2021-12-03 HISTORY — PX: RIGHT/LEFT HEART CATH AND CORONARY ANGIOGRAPHY: CATH118266

## 2021-12-03 LAB — POCT I-STAT EG7
Acid-Base Excess: 3 mmol/L — ABNORMAL HIGH (ref 0.0–2.0)
Acid-Base Excess: 6 mmol/L — ABNORMAL HIGH (ref 0.0–2.0)
Bicarbonate: 31.2 mmol/L — ABNORMAL HIGH (ref 20.0–28.0)
Bicarbonate: 34.7 mmol/L — ABNORMAL HIGH (ref 20.0–28.0)
Calcium, Ion: 0.97 mmol/L — ABNORMAL LOW (ref 1.15–1.40)
Calcium, Ion: 1.16 mmol/L (ref 1.15–1.40)
HCT: 40 % (ref 36.0–46.0)
HCT: 43 % (ref 36.0–46.0)
Hemoglobin: 13.6 g/dL (ref 12.0–15.0)
Hemoglobin: 14.6 g/dL (ref 12.0–15.0)
O2 Saturation: 64 %
O2 Saturation: 66 %
Potassium: 3.2 mmol/L — ABNORMAL LOW (ref 3.5–5.1)
Potassium: 3.7 mmol/L (ref 3.5–5.1)
Sodium: 141 mmol/L (ref 135–145)
Sodium: 144 mmol/L (ref 135–145)
TCO2: 33 mmol/L — ABNORMAL HIGH (ref 22–32)
TCO2: 37 mmol/L — ABNORMAL HIGH (ref 22–32)
pCO2, Ven: 62.3 mmHg — ABNORMAL HIGH (ref 44–60)
pCO2, Ven: 69.6 mmHg — ABNORMAL HIGH (ref 44–60)
pH, Ven: 7.306 (ref 7.25–7.43)
pH, Ven: 7.308 (ref 7.25–7.43)
pO2, Ven: 38 mmHg (ref 32–45)
pO2, Ven: 39 mmHg (ref 32–45)

## 2021-12-03 LAB — BASIC METABOLIC PANEL
Anion gap: 14 (ref 5–15)
BUN: 25 mg/dL — ABNORMAL HIGH (ref 8–23)
CO2: 32 mmol/L (ref 22–32)
Calcium: 9 mg/dL (ref 8.9–10.3)
Chloride: 97 mmol/L — ABNORMAL LOW (ref 98–111)
Creatinine, Ser: 1.5 mg/dL — ABNORMAL HIGH (ref 0.44–1.00)
GFR, Estimated: 39 mL/min — ABNORMAL LOW (ref >60)
Glucose, Bld: 87 mg/dL (ref 70–99)
Potassium: 3.6 mmol/L (ref 3.5–5.1)
Sodium: 143 mmol/L (ref 135–145)

## 2021-12-03 LAB — CBC
HCT: 48.2 % — ABNORMAL HIGH (ref 36.0–46.0)
Hemoglobin: 15.5 g/dL — ABNORMAL HIGH (ref 12.0–15.0)
MCH: 30.5 pg (ref 26.0–34.0)
MCHC: 32.2 g/dL (ref 30.0–36.0)
MCV: 94.9 fL (ref 80.0–100.0)
Platelets: 203 10*3/uL (ref 150–400)
RBC: 5.08 MIL/uL (ref 3.87–5.11)
RDW: 13.1 % (ref 11.5–15.5)
WBC: 11.6 10*3/uL — ABNORMAL HIGH (ref 4.0–10.5)
nRBC: 0 % (ref 0.0–0.2)

## 2021-12-03 LAB — GLUCOSE, CAPILLARY: Glucose-Capillary: 73 mg/dL (ref 70–99)

## 2021-12-03 LAB — POCT I-STAT 7, (LYTES, BLD GAS, ICA,H+H)
Acid-Base Excess: 1 mmol/L (ref 0.0–2.0)
Bicarbonate: 28.6 mmol/L — ABNORMAL HIGH (ref 20.0–28.0)
Calcium, Ion: 0.88 mmol/L — CL (ref 1.15–1.40)
HCT: 37 % (ref 36.0–46.0)
Hemoglobin: 12.6 g/dL (ref 12.0–15.0)
O2 Saturation: 88 %
Potassium: 3 mmol/L — ABNORMAL LOW (ref 3.5–5.1)
Sodium: 146 mmol/L — ABNORMAL HIGH (ref 135–145)
TCO2: 30 mmol/L (ref 22–32)
pCO2 arterial: 55.1 mmHg — ABNORMAL HIGH (ref 32–48)
pH, Arterial: 7.323 — ABNORMAL LOW (ref 7.35–7.45)
pO2, Arterial: 61 mmHg — ABNORMAL LOW (ref 83–108)

## 2021-12-03 SURGERY — RIGHT/LEFT HEART CATH AND CORONARY ANGIOGRAPHY
Anesthesia: LOCAL

## 2021-12-03 MED ORDER — SODIUM CHLORIDE 0.9 % IV SOLN: 250.0000 mL | INTRAVENOUS | Status: DC | PRN

## 2021-12-03 MED ORDER — FENTANYL CITRATE (PF) 100 MCG/2ML IJ SOLN
INTRAMUSCULAR | Status: AC
  Filled 2021-12-03: qty 2

## 2021-12-03 MED ORDER — ASPIRIN 81 MG PO CHEW
CHEWABLE_TABLET | ORAL | Status: AC
  Filled 2021-12-03: qty 1

## 2021-12-03 MED ORDER — VERAPAMIL HCL 2.5 MG/ML IV SOLN
INTRAVENOUS | Status: DC | PRN
  Administered 2021-12-03: 10 mL via INTRA_ARTERIAL

## 2021-12-03 MED ORDER — HEPARIN (PORCINE) IN NACL 1000-0.9 UT/500ML-% IV SOLN
INTRAVENOUS | Status: DC | PRN
  Administered 2021-12-03 (×2): 500 mL

## 2021-12-03 MED ORDER — LIDOCAINE HCL (PF) 1 % IJ SOLN
INTRAMUSCULAR | Status: DC | PRN
  Administered 2021-12-03 (×2): 2 mL

## 2021-12-03 MED ORDER — MIDAZOLAM HCL 2 MG/2ML IJ SOLN
INTRAMUSCULAR | Status: DC | PRN
  Administered 2021-12-03 (×2): 1 mg via INTRAVENOUS

## 2021-12-03 MED ORDER — SODIUM CHLORIDE 0.9 % IV SOLN: INTRAVENOUS | Status: DC

## 2021-12-03 MED ORDER — MIDAZOLAM HCL 2 MG/2ML IJ SOLN
INTRAMUSCULAR | Status: AC
  Filled 2021-12-03: qty 2

## 2021-12-03 MED ORDER — ACETAMINOPHEN 325 MG PO TABS: 650.0000 mg | ORAL_TABLET | ORAL | Status: DC | PRN

## 2021-12-03 MED ORDER — SODIUM CHLORIDE 0.9% FLUSH: 3.0000 mL | INTRAVENOUS | Status: DC | PRN

## 2021-12-03 MED ORDER — HEPARIN SODIUM (PORCINE) 1000 UNIT/ML IJ SOLN
INTRAMUSCULAR | Status: DC | PRN
  Administered 2021-12-03: 6000 [IU] via INTRAVENOUS

## 2021-12-03 MED ORDER — IOHEXOL 350 MG/ML SOLN
INTRAVENOUS | Status: DC | PRN
  Administered 2021-12-03: 20 mL

## 2021-12-03 MED ORDER — LABETALOL HCL 5 MG/ML IV SOLN: 10.0000 mg | INTRAVENOUS | Status: DC | PRN

## 2021-12-03 MED ORDER — LIDOCAINE HCL (PF) 1 % IJ SOLN
INTRAMUSCULAR | Status: AC
  Filled 2021-12-03: qty 30

## 2021-12-03 MED ORDER — SODIUM CHLORIDE 0.9% FLUSH: 3.0000 mL | Freq: Two times a day (BID) | INTRAVENOUS | Status: DC

## 2021-12-03 MED ORDER — HEPARIN (PORCINE) IN NACL 1000-0.9 UT/500ML-% IV SOLN
INTRAVENOUS | Status: AC
  Filled 2021-12-03: qty 1000

## 2021-12-03 MED ORDER — ONDANSETRON HCL 4 MG/2ML IJ SOLN: 4.0000 mg | Freq: Four times a day (QID) | INTRAMUSCULAR | Status: DC | PRN

## 2021-12-03 MED ORDER — HYDRALAZINE HCL 20 MG/ML IJ SOLN: 10.0000 mg | INTRAMUSCULAR | Status: DC | PRN

## 2021-12-03 MED ORDER — VERAPAMIL HCL 2.5 MG/ML IV SOLN
INTRAVENOUS | Status: AC
  Filled 2021-12-03: qty 2

## 2021-12-03 MED ORDER — FENTANYL CITRATE (PF) 100 MCG/2ML IJ SOLN
INTRAMUSCULAR | Status: DC | PRN
  Administered 2021-12-03: 25 ug via INTRAVENOUS

## 2021-12-03 MED ORDER — HEPARIN SODIUM (PORCINE) 1000 UNIT/ML IJ SOLN
INTRAMUSCULAR | Status: AC
  Filled 2021-12-03: qty 10

## 2021-12-03 MED ORDER — ASPIRIN 81 MG PO CHEW
81.0000 mg | CHEWABLE_TABLET | Freq: Once | ORAL | Status: AC
  Administered 2021-12-03: 81 mg via ORAL
  Filled 2021-12-03: qty 1

## 2021-12-03 SURGICAL SUPPLY — 12 items
BAND CMPR LRG ZPHR (HEMOSTASIS) ×1
BAND ZEPHYR COMPRESS 30 LONG (HEMOSTASIS) IMPLANT
CATH INFINITI 5 FR JL3.5 (CATHETERS) IMPLANT
CATH INFINITI 5 FR JR3.5 (CATHETERS) IMPLANT
CATH SWAN GANZ 7F STRAIGHT (CATHETERS) IMPLANT
GLIDESHEATH SLEND SS 6F .021 (SHEATH) IMPLANT
GLIDESHEATH SLENDER 7FR .021G (SHEATH) IMPLANT
GUIDEWIRE .025 260CM (WIRE) IMPLANT
GUIDEWIRE INQWIRE 1.5J.035X260 (WIRE) IMPLANT
INQWIRE 1.5J .035X260CM (WIRE) ×1
PACK CARDIAC CATHETERIZATION (CUSTOM PROCEDURE TRAY) ×2 IMPLANT
TRANSDUCER W/STOPCOCK (MISCELLANEOUS) ×2 IMPLANT

## 2021-12-03 NOTE — Progress Notes (Signed)
TR band removed, no s/s of bleeding or hematoma noted. Will continue to monitor until pt d/c

## 2021-12-03 NOTE — H&P (Signed)
ADVANCED HF CLINIC NOTE  Referring Physician: Laurey Morale, MD Primary Care: Nancy Morale, MD Primary Cardiologist: None   HPI:  Nancy [name] is a 66 y.o. female (mother of Nancy Dolecki, CRNA) with morbid obesity (Body mass index is 47.3 kg/m.), non-obstructive CAD, HTN, HTN mild COPD, former smoker, and GERD. Referred by Nancy [name] for further evaluation of her diastolic HF.   Echo 2019 EF 50-55% G1DD  She had a LHC in 2001 that showed no blockages. It was felt she had an atypical heart attack in setting of endometrial ablation. Nancy [name] 2009. Mild non-obstructive CAD  Admitted in 5/23 with flank pain. Found to have sats in 70s. Found to have volume overloaded. Started on lasix.   Echo 06/05/21 EF 55-60% G1DD. RV normal. TR insufficient to measure pulmonary pressures   CT angio of the chest showed no PE but it did showed ectasia of the main pulmonary artery and pulmonary edema.   At most recent office visit was c/o persistent exertional dyspnea and chest pressure. Continues to wear O2. Mild edema at times. Concerned about PAH as contributing factor to dyspnea.    Studies:  Zio 7/23 Monitor 1 1. Sinus rhythm - avg HR of 68 bpm. 2. First Degree AV Block was present.  3. One run of supraventricular tachycardia occurred lasting 4 beats with a max rate of 129 bpm (avg 112  bpm).  4. Rare PACs and PVCs   Monitor 2 1. Sinus rhythm - avg HR of 69 bpm. Predominant underlying rhythm was Sinus Rhythm. ] 2. Three runs of nonsustained ventricular tachycardia occurred, the run with the fastest interval lasting 6 beats with a max rate of 135 bpm, the longest lasting 7 beats with an avg rate of 110 bpm. 3. Three runs of supraventricular tachycardia occurred, the run with the fastest interval lasting 4 beats with a max rate of 128 bpm, the longest lasting 8 beats with an avg rate of 104 bpm. 4. Rare PACs and PVCs   Past Medical History:  Diagnosis Date   Allergic rhinitis    gets shots  per Nancy [name]    Anemia 2001   Anginal pain ([place])    Asthma    COPD (chronic obstructive pulmonary disease) ([place])    "CXR just showed mild COPD" (10/24/2011)   Coronary artery disease    had MI in 2001, see[facility] Dr. Fletcher [name] [facility]   GERD (gastroesophageal reflux disease)    Gynecological examination    sees Dr. Cherylann [name]    History of cardiac catheterization 07-24-07   showed nonconclusive disease   Hyperlipidemia    Hypertension    Hypothyroidism    Migraines    "have a history of migraines; haven't had one for years" (10/24/2011)   Myocardial infarction [facility]) 2001?   in her 14's    Current Facility-Administered Medications  Medication Dose Route Frequency Provider Last Rate Last Admin   0.9 %  sodium chloride infusion  250 mL Intravenous PRN Nancy [name], Nancy Pascal, MD       0.9 %  sodium chloride infusion   Intravenous Continuous Nancy [name], Nancy Pascal, MD 10 mL/hr at 12/03/21 0622 New Bag at 12/03/21 0622   aspirin 81 MG chewable tablet            sodium chloride flush (NS) 0.9 % injection 3 mL  3 mL Intravenous Q12H Nancy [name], Nancy Pascal, MD       sodium chloride flush (NS) 0.9 % injection 3 mL  3 mL Intravenous PRN Nancy [name], Nancy Pascal, MD        Allergies  Allergen Reactions   Sulfa Antibiotics Hives      Social History   Socioeconomic History   Marital status: Divorced    Spouse name: Not on file   Number of children: 1   Years of education: Not on file   Highest education level: Associate degree: occupational, Hotel manager, or vocational program  Occupational History   Occupation: respiratory Nancy [name]  Tobacco Use   Smoking status: Former    Packs/day: 1.00    Years: 30.00    Total pack years: 30.00    Types: Cigarettes    Quit date: 10/24/2006    Years since quitting: 15.1   Smokeless tobacco: Never  Vaping Use   Vaping Use: Never used  Substance and Sexual Activity   Alcohol use: No    Alcohol/week: 0.0 standard drinks of alcohol   Drug use: No    Sexual activity: Never  Other Topics Concern   Not on file  Social History Narrative   Not on file   Social Determinants of Health   Financial Resource Strain: Low Risk  (06/18/2021)   Overall Financial Resource Strain (CARDIA)    Difficulty of Paying Living Expenses: Not hard at all  Food Insecurity: No Food Insecurity (06/18/2021)   Hunger Vital Sign    Worried About Running Out of Food in the Last Year: Never true    [place] in the Last Year: Never true  Transportation Needs: No Transportation Needs (06/18/2021)   PRAPARE - Hydrologist (Medical): No    Lack of Transportation (Non-Medical): No  Physical Activity: Unknown (06/18/2021)   Exercise Vital Sign    Days of Exercise per Week: 0 days    Minutes of Exercise per Session: Not on file  Stress: No Stress Concern Present (06/18/2021)   [place]    Feeling of Stress : Not at all  Social Connections: Unknown (06/18/2021)   Social Connection and Isolation Panel [NHANES]    Frequency of Communication with Friends and Family: Patient refused    Frequency of Social Gatherings with Friends and Family: Patient refused    Attends Religious Services: Never    Marine scientist or Organizations: No    Attends Music Nancy [name]: Not on file    Marital Status: Divorced  Human resources officer Violence: Not on file      Family History  Problem Relation Age of Onset   Pulmonary fibrosis Mother    COPD Mother    Breast cancer Mother        50's   Coronary artery disease Father    Hypertension Father    Cancer Other        breast,colon,prostate   Alcohol abuse Other    Hyperlipidemia Other    Hypertension Other    Stroke Other    Heart disease Other    COPD Other    Diabetes Other    Colon cancer Paternal Grandfather    Stomach cancer Other    Esophageal cancer Neg Hx    Rectal cancer Neg Hx     Vitals:    12/03/21 0620  BP: 129/79  Pulse: (!) 55  Resp: 17  Temp: (!) 97.3 F (36.3 C)  TempSrc: Oral  SpO2: 94%  Weight: (!) 137 kg  Height: 5' 7"  (1.702 m)   Body mass index  is 47.3 kg/m.  PHYSICAL EXAM: General:  Well appearing. No resp difficulty HEENT: normal + O2 Neck: supple. Hard to see JVD. Carotids 2+ bilat; no bruits. No lymphadenopathy or thryomegaly appreciated. Cor: PMI nondisplaced. Regular rate & rhythm. No rubs, gallops or murmurs. Lungs: clear Abdomen: obese soft, nontender, nondistended. No hepatosplenomegaly. No bruits or masses. Good bowel sounds. Extremities: no cyanosis, clubbing, rash, tr edema Neuro: alert & orientedx3, cranial nerves grossly intact. moves all 4 extremities w/o difficulty. Affect pleasant   ASSESSMENT & PLAN:  1. Presyncope/syncope/palpitations - symptoms sound clearly orthostatic but BP readings do not confirm. May have some variation based on volume status.  If [place] contributing.  - Encouraged compression compression hose - Zio 5/23 ok - reassuring  2. Chronic diastolic HF - Echo 01/5174 EF 50-55%, grade 1 DD - Echo 06/05/21 EF 55-60% G1DD RV ok  - Remains NYHA III with exertional dyspnea and some chest pressure. R/L cath planned - Volume status up and down  - Switch lasix to Jardiance    3. CAD - Last LHC 2009 and showed mild, nonobstructive CAD. - Stress test 2013 with no ischemia - Continue ASA, statin, and BB - Continues with exertional dyspnea and some chest pressure Plan R/L cath as below   4. HTN -Ideally would like her to be SBP 120-130 but with orthostasis and frequent presyncope will tolerate 130-140. Continue to follow   5. Obesity - Body mass index is 47.3 kg/m. - Losing weight with GLP1RA (Ozempic)  6. CKD 3b - continue Jardiance  7. Chronic hypoxic respiratory failure - concern for PAH - will plan R/L HC  Glori Bickers, MD  7:34 AM

## 2021-12-03 NOTE — Progress Notes (Signed)
Pt and caretaker/daughter received d/c instructions, written and verbal. All questions and concerns addressed. PIVs removed and intact. Dressing in place, no beeling or hematoma noted. Pt left w/ at home 02 and all belongings. Left in stable condition.

## 2021-12-04 ENCOUNTER — Encounter (HOSPITAL_COMMUNITY): Payer: Self-pay | Admitting: Internal Medicine

## 2021-12-06 ENCOUNTER — Encounter: Payer: Self-pay | Admitting: Family Medicine

## 2021-12-07 ENCOUNTER — Other Ambulatory Visit: Payer: Self-pay

## 2021-12-07 DIAGNOSIS — I10 Essential (primary) hypertension: Secondary | ICD-10-CM

## 2021-12-07 DIAGNOSIS — E039 Hypothyroidism, unspecified: Secondary | ICD-10-CM

## 2021-12-07 DIAGNOSIS — I2510 Atherosclerotic heart disease of native coronary artery without angina pectoris: Secondary | ICD-10-CM | POA: Diagnosis not present

## 2021-12-07 MED ORDER — ATORVASTATIN CALCIUM 80 MG PO TABS: 80.0000 mg | ORAL_TABLET | Freq: Every day | ORAL | 0 refills | Status: DC

## 2021-12-07 MED ORDER — SPIRONOLACTONE 25 MG PO TABS: 25.0000 mg | ORAL_TABLET | Freq: Every day | ORAL | 0 refills | Status: DC

## 2021-12-07 MED ORDER — LEVOTHYROXINE SODIUM 200 MCG PO TABS: 200.0000 ug | ORAL_TABLET | Freq: Every day | ORAL | 0 refills | Status: DC

## 2021-12-13 ENCOUNTER — Telehealth: Payer: Self-pay | Admitting: Family Medicine

## 2021-12-13 ENCOUNTER — Telehealth (HOSPITAL_COMMUNITY): Payer: Self-pay

## 2021-12-13 NOTE — Telephone Encounter (Signed)
This person wanted to talk with the provider regarding the person's level of disability.

## 2021-12-13 NOTE — Telephone Encounter (Signed)
Pt has been scheduled.  °

## 2021-12-13 NOTE — Telephone Encounter (Signed)
Seeking info on patient's level of disability, seeking sedentary

## 2021-12-13 NOTE — Telephone Encounter (Signed)
Louise with [employer] called wanting to know if she could get clearance for patient to

## 2021-12-13 NOTE — Telephone Encounter (Signed)
[employer] disability paperwork scanned in on 12/03/21.

## 2021-12-13 NOTE — Telephone Encounter (Signed)
Set up an OV with me to discuss this

## 2021-12-14 NOTE — Telephone Encounter (Signed)
Sorry didn't realize my message sent without me finishing it but she wants clearance for patient to do sit down desk duty at work.

## 2021-12-15 ENCOUNTER — Other Ambulatory Visit: Payer: Self-pay | Admitting: Family Medicine

## 2021-12-18 ENCOUNTER — Ambulatory Visit (INDEPENDENT_AMBULATORY_CARE_PROVIDER_SITE_OTHER): Payer: 59 | Admitting: Family Medicine

## 2021-12-18 ENCOUNTER — Encounter: Payer: Self-pay | Admitting: Family Medicine

## 2021-12-18 VITALS — BP 110/76 | HR 74 | Temp 99.2°F | Wt 309.0 lb

## 2021-12-18 DIAGNOSIS — J4540 Moderate persistent asthma, uncomplicated: Secondary | ICD-10-CM | POA: Diagnosis not present

## 2021-12-18 DIAGNOSIS — J209 Acute bronchitis, unspecified: Secondary | ICD-10-CM | POA: Diagnosis not present

## 2021-12-18 DIAGNOSIS — J440 Chronic obstructive pulmonary disease with acute lower respiratory infection: Secondary | ICD-10-CM | POA: Diagnosis not present

## 2021-12-18 LAB — POCT INFLUENZA A/B
Influenza A, POC: NEGATIVE
Influenza B, POC: NEGATIVE

## 2021-12-18 LAB — POC COVID19 BINAXNOW: SARS Coronavirus 2 Ag: NEGATIVE

## 2021-12-18 MED ORDER — FUROSEMIDE 80 MG PO TABS: 80.0000 mg | ORAL_TABLET | Freq: Every day | ORAL | 3 refills | Status: DC

## 2021-12-18 MED ORDER — LEVOFLOXACIN 500 MG PO TABS: 500.0000 mg | ORAL_TABLET | Freq: Every day | ORAL | 0 refills | Status: AC

## 2021-12-18 MED ORDER — METHYLPREDNISOLONE ACETATE 40 MG/ML IJ SUSP
40.0000 mg | Freq: Once | INTRAMUSCULAR | Status: AC
  Administered 2021-12-18: 40 mg via INTRAMUSCULAR

## 2021-12-18 MED ORDER — ALBUTEROL SULFATE (2.5 MG/3ML) 0.083% IN NEBU: INHALATION_SOLUTION | RESPIRATORY_TRACT | 11 refills | Status: AC

## 2021-12-18 MED ORDER — TEMAZEPAM 30 MG PO CAPS: 30.0000 mg | ORAL_CAPSULE | Freq: Every evening | ORAL | 1 refills | Status: DC | PRN

## 2021-12-18 MED ORDER — METHYLPREDNISOLONE ACETATE 80 MG/ML IJ SUSP
80.0000 mg | Freq: Once | INTRAMUSCULAR | Status: AC
  Administered 2021-12-18: 80 mg via INTRAMUSCULAR

## 2021-12-18 NOTE — Addendum Note (Signed)
Addended by: Wyvonne Lenz on: 12/18/2021 03:51 PM   Modules accepted: Orders

## 2021-12-18 NOTE — Addendum Note (Signed)
Addended by: Wyvonne Lenz on: 12/18/2021 04:50 PM   Modules accepted: Orders

## 2021-12-18 NOTE — Progress Notes (Signed)
   Subjective:    Patient ID: Nancy [name], female    DOB: [birth year], 66 y.o.   MRN: [id number]  HPI Here for 3 days of fever to 101.4 degrees, headache, ST, and a dry cough. Some SOB but no chest pain. She is drinking fluids and taking Alka Seltzer. Using her Harmony oxygen.    Review of Systems  Constitutional:  Positive for fever.  HENT:  Positive for sore throat. Negative for congestion, ear pain and postnasal drip.   Eyes: Negative.   Respiratory:  Positive for cough, shortness of breath and wheezing.   Cardiovascular: Negative.   Gastrointestinal: Negative.   Neurological:  Positive for headaches.       Objective:   Physical Exam Constitutional:      Appearance: She is not ill-appearing.     Comments: Coughing frequently   HENT:     Right Ear: Tympanic membrane, ear canal and external ear normal.     Left Ear: Tympanic membrane, ear canal and external ear normal.     Nose: Nose normal.     Mouth/Throat:     Pharynx: Oropharynx is clear.  Eyes:     Conjunctiva/sclera: Conjunctivae normal.  Cardiovascular:     Rate and Rhythm: Normal rate and regular rhythm.     Pulses: Normal pulses.     Heart sounds: Normal heart sounds.  Pulmonary:     Effort: Pulmonary effort is normal.     Breath sounds: Wheezing and rhonchi present. No rales.  Lymphadenopathy:     Cervical: No cervical adenopathy.  Neurological:     Mental Status: She is alert.           Assessment & Plan:  Bronchitis, treat with 10 days of Levaquin. Given a shot of DepoMedrol.  Alysia Penna, MD

## 2021-12-19 ENCOUNTER — Encounter: Payer: Self-pay | Admitting: Family Medicine

## 2021-12-19 NOTE — Telephone Encounter (Signed)
Pt  states that [facility] only send in 7 tablets for Temazepam, pt requests for Dr Sarajane Jews to send in 90 days supply to [facility] so she can pay for it out of pocket. Pt is aware that Dr Sarajane Jews will back in the office on Monday 12/24/21.

## 2021-12-24 ENCOUNTER — Other Ambulatory Visit (HOSPITAL_COMMUNITY): Payer: Self-pay

## 2021-12-24 ENCOUNTER — Other Ambulatory Visit (HOSPITAL_BASED_OUTPATIENT_CLINIC_OR_DEPARTMENT_OTHER): Payer: Self-pay

## 2021-12-24 ENCOUNTER — Encounter: Payer: Self-pay | Admitting: Family Medicine

## 2021-12-24 MED ORDER — TEMAZEPAM 30 MG PO CAPS
30.0000 mg | ORAL_CAPSULE | Freq: Every evening | ORAL | 1 refills | Status: DC | PRN
  Filled 2021-12-24 – 2022-02-18 (×7): qty 90, 90d supply, fill #0

## 2021-12-24 NOTE — Telephone Encounter (Signed)
Done

## 2021-12-25 NOTE — Telephone Encounter (Signed)
[employer] has called several times , lvm on triad line regarding clarification, please call [place] [phone], or she will deny disability in a couple of hours   Returned call to [facility], they need clarification if pt can do sedentary job, advised per Dr Haroldine Laws pt can do sit down/sedentary desk work, she is aware and will update chart

## 2021-12-26 ENCOUNTER — Other Ambulatory Visit (HOSPITAL_COMMUNITY): Payer: Self-pay

## 2021-12-26 ENCOUNTER — Encounter: Payer: Self-pay | Admitting: Family Medicine

## 2021-12-28 DIAGNOSIS — I5032 Chronic diastolic (congestive) heart failure: Secondary | ICD-10-CM | POA: Diagnosis not present

## 2021-12-28 DIAGNOSIS — J9611 Chronic respiratory failure with hypoxia: Secondary | ICD-10-CM | POA: Diagnosis not present

## 2021-12-28 DIAGNOSIS — E039 Hypothyroidism, unspecified: Secondary | ICD-10-CM | POA: Diagnosis not present

## 2021-12-28 DIAGNOSIS — Z9981 Dependence on supplemental oxygen: Secondary | ICD-10-CM | POA: Diagnosis not present

## 2021-12-28 DIAGNOSIS — N1832 Chronic kidney disease, stage 3b: Secondary | ICD-10-CM | POA: Diagnosis not present

## 2021-12-31 ENCOUNTER — Telehealth: Payer: Self-pay

## 2021-12-31 NOTE — Telephone Encounter (Signed)
Pt physician statement and pt last office notes was completed and faxed to [facility]) confirmation received. Original copies sent to scanning

## 2022-01-01 ENCOUNTER — Other Ambulatory Visit (HOSPITAL_COMMUNITY): Payer: Self-pay

## 2022-01-04 NOTE — Telephone Encounter (Signed)
Spoke with pt aware that Rx was sent and the [facility] paperwork was faxed to [place]

## 2022-01-06 DIAGNOSIS — I2510 Atherosclerotic heart disease of native coronary artery without angina pectoris: Secondary | ICD-10-CM | POA: Diagnosis not present

## 2022-01-11 ENCOUNTER — Other Ambulatory Visit: Payer: Self-pay | Admitting: Nephrology

## 2022-01-11 ENCOUNTER — Other Ambulatory Visit: Payer: Self-pay | Admitting: Family Medicine

## 2022-01-11 DIAGNOSIS — N1832 Chronic kidney disease, stage 3b: Secondary | ICD-10-CM

## 2022-01-18 ENCOUNTER — Encounter: Payer: Self-pay | Admitting: Family Medicine

## 2022-01-22 ENCOUNTER — Other Ambulatory Visit: Payer: Self-pay

## 2022-01-22 DIAGNOSIS — I10 Essential (primary) hypertension: Secondary | ICD-10-CM

## 2022-01-22 DIAGNOSIS — E039 Hypothyroidism, unspecified: Secondary | ICD-10-CM

## 2022-01-22 MED ORDER — ATORVASTATIN CALCIUM 80 MG PO TABS: 80.0000 mg | ORAL_TABLET | Freq: Every day | ORAL | 0 refills | Status: DC

## 2022-01-22 MED ORDER — METOPROLOL TARTRATE 25 MG PO TABS: 25.0000 mg | ORAL_TABLET | Freq: Two times a day (BID) | ORAL | 0 refills | Status: DC

## 2022-01-22 MED ORDER — SPIRONOLACTONE 25 MG PO TABS: 25.0000 mg | ORAL_TABLET | Freq: Every day | ORAL | 0 refills | Status: DC

## 2022-01-22 MED ORDER — LEVOTHYROXINE SODIUM 200 MCG PO TABS: 200.0000 ug | ORAL_TABLET | Freq: Every day | ORAL | 0 refills | Status: DC

## 2022-01-23 ENCOUNTER — Other Ambulatory Visit (HOSPITAL_COMMUNITY): Payer: Self-pay

## 2022-01-23 ENCOUNTER — Ambulatory Visit
Admission: RE | Admit: 2022-01-23 | Discharge: 2022-01-23 | Disposition: A | Discharge status: Home / Self Care | Payer: 59 | Source: Ambulatory Visit | Attending: Nephrology | Admitting: Nephrology

## 2022-01-23 DIAGNOSIS — N189 Chronic kidney disease, unspecified: Secondary | ICD-10-CM | POA: Diagnosis not present

## 2022-01-23 DIAGNOSIS — N1832 Chronic kidney disease, stage 3b: Secondary | ICD-10-CM

## 2022-01-29 ENCOUNTER — Other Ambulatory Visit (HOSPITAL_COMMUNITY): Payer: Self-pay

## 2022-02-06 DIAGNOSIS — I2510 Atherosclerotic heart disease of native coronary artery without angina pectoris: Secondary | ICD-10-CM | POA: Diagnosis not present

## 2022-02-14 ENCOUNTER — Telehealth (INDEPENDENT_AMBULATORY_CARE_PROVIDER_SITE_OTHER): Payer: 59 | Admitting: Family Medicine

## 2022-02-14 ENCOUNTER — Encounter: Payer: Self-pay | Admitting: Family Medicine

## 2022-02-14 DIAGNOSIS — J40 Bronchitis, not specified as acute or chronic: Secondary | ICD-10-CM

## 2022-02-14 MED ORDER — LEVOFLOXACIN 500 MG PO TABS: 500.0000 mg | ORAL_TABLET | Freq: Every day | ORAL | 0 refills | Status: AC

## 2022-02-14 NOTE — Progress Notes (Signed)
Subjective:    Patient ID: Nancy [name], female    DOB: 10/13/1955, 67 y.o.   MRN: [id number]  HPI Virtual Visit via Video Note  I connected with the patient on 02/14/22 at 10:30 AM EST by a video enabled telemedicine application and verified that I am speaking with the correct person using two identifiers.  Location patient: home Location provider:work or home office Persons participating in the virtual visit: patient, provider  I discussed the limitations of evaluation and management by telemedicine and the availability of in person appointments. The patient expressed understanding and agreed to proceed.   HPI: Here for 3 days of stuffy head, ST, chest congestion and coughing up yellow sputum. No fever. No more SOB than usual. She wears continuous [place] oxygen. Using her nebulizer with albuterol.    ROS: See pertinent positives and negatives per HPI.  Past Medical History:  Diagnosis Date   Allergic rhinitis    gets shots per Dr. Harold Hedge    Anemia 2001   Anginal pain ([place])    Asthma    COPD (chronic obstructive pulmonary disease) ([place])    "CXR just showed mild COPD" (10/24/2011)   Coronary artery disease    had MI in 2001, seees Dr. Fletcher Anon at [facility]   GERD (gastroesophageal reflux disease)    Gynecological examination    sees Dr. Cherylann Banas    History of cardiac catheterization 07-24-07   showed nonconclusive disease   Hyperlipidemia    Hypertension    Hypothyroidism    Migraines    "have a history of migraines; haven't had one for years" (10/24/2011)   Myocardial infarction [facility]) 2001?   in her 24's    Past Surgical History:  Procedure Laterality Date   CARDIAC CATHETERIZATION  2009   [place]   COLONOSCOPY  10-06-08   per Dr. Henrene Pastor, benign polyps, repeat in 10 yrs    ESOPHAGOGASTRODUODENOSCOPY  05/29/2017   per Dr. Henrene Pastor, normal except slight gastritis    HERNIA REPAIR  ~ 2007   ventral hernia repair   RIGHT/LEFT HEART CATH AND  CORONARY ANGIOGRAPHY N/A 12/03/2021   Procedure: RIGHT/LEFT HEART CATH AND CORONARY ANGIOGRAPHY;  Surgeon: Jolaine Artist, MD;  Location: [facility];  Service: Cardiovascular;  Laterality: N/A;   THYROIDECTOMY  1990's   TONSILLECTOMY     "when I was a kid"    Family History  Problem Relation Age of Onset   Pulmonary fibrosis Mother    COPD Mother    Breast cancer Mother        42's   Coronary artery disease Father    Hypertension Father    Cancer Other        breast,colon,prostate   Alcohol abuse Other    Hyperlipidemia Other    Hypertension Other    Stroke Other    Heart disease Other    COPD Other    Diabetes Other    Colon cancer Paternal Grandfather    Stomach cancer Other    Esophageal cancer Neg Hx    Rectal cancer Neg Hx      Current Outpatient Medications:    albuterol (PROVENTIL) (2.5 MG/3ML) 0.083% nebulizer solution, INHALE 3 MLS (ONE VIAL) BY NEBULIZATION EVERY 4 HOURS AS NEEDED FOR WHEEZING OR SHORTNESS OF BREATH., Disp: 75 mL, Rfl: 11   albuterol (VENTOLIN HFA) 108 (90 Base) MCG/ACT inhaler, Inhale 2 puffs into the lungs every 4 (four) hours as needed for wheezing or shortness of breath.,  Disp: 18 g, Rfl: 5   aspirin 81 MG tablet, Take 81 mg by mouth at bedtime. , Disp: , Rfl:    atorvastatin (LIPITOR) 80 MG tablet, Take 1 tablet (80 mg total) by mouth daily., Disp: 90 tablet, Rfl: 0   calcitRIOL (ROCALTROL) 0.25 MCG capsule, TAKE 1 CAPSULE TWICE DAILY, Disp: 180 capsule, Rfl: 3   calcium citrate (CALCITRATE) 950 (200 Ca) MG tablet, Take 1 tablet (200 mg of elemental calcium total) by mouth 3 (three) times daily. (Patient taking differently: Take 200-400 mg of elemental calcium by mouth See admin instructions. 200 mg in the morning, 400 mg in the evening), Disp: 3 tablet, Rfl: 0   cetirizine (ZYRTEC) 10 MG tablet, Take 10 mg by mouth daily., Disp: , Rfl:    empagliflozin (JARDIANCE) 10 MG TABS tablet, Take 1 tablet (10 mg total) by mouth daily before  breakfast., Disp: 30 tablet, Rfl: 11   escitalopram (LEXAPRO) 20 MG tablet, TAKE 1 TABLET BY MOUTH EVERY DAY, Disp: 90 tablet, Rfl: 2   furosemide (LASIX) 80 MG tablet, Take 1 tablet (80 mg total) by mouth daily., Disp: 90 tablet, Rfl: 3   gabapentin (NEURONTIN) 300 MG capsule, Take 1 capsule (300 mg total) by mouth 2 (two) times daily., Disp: 180 capsule, Rfl: 3   HYDROcodone-acetaminophen (NORCO/VICODIN) 5-325 MG tablet, Take 1 tablet by mouth every 4 (four) hours as needed for moderate pain., Disp: , Rfl:    levofloxacin (LEVAQUIN) 500 MG tablet, Take 1 tablet (500 mg total) by mouth daily for 10 days., Disp: 10 tablet, Rfl: 0   levothyroxine (SYNTHROID) 200 MCG tablet, Take 1 tablet (200 mcg total) by mouth daily., Disp: 90 tablet, Rfl: 0   magnesium oxide (MAG-OX) 400 (240 Mg) MG tablet, Take 400 mg by mouth 2 (two) times daily., Disp: , Rfl:    metoprolol tartrate (LOPRESSOR) 25 MG tablet, Take 1 tablet (25 mg total) by mouth 2 (two) times daily., Disp: 180 tablet, Rfl: 0   montelukast (SINGULAIR) 10 MG tablet, Take 1 tablet (10 mg total) by mouth at bedtime., Disp: 90 tablet, Rfl: 3   Multiple Vitamin (MULTIVITAMIN) tablet, Take 1 tablet by mouth at bedtime., Disp: , Rfl:    omeprazole (PRILOSEC) 40 MG capsule, TAKE 1 CAPSULE TWICE DAILY, Disp: 180 capsule, Rfl: 0   OXYGEN, Inhale 3-4 L into the lungs continuous., Disp: , Rfl:    Semaglutide,0.25 or 0.'5MG'$ /DOS, 2 MG/1.5ML SOPN, Inject 2.5 mg into the skin once a week. (Patient taking differently: Inject 0.65 mg into the skin every Saturday.), Disp: 1.5 mL, Rfl: 2   spironolactone (ALDACTONE) 25 MG tablet, Take 1 tablet (25 mg total) by mouth daily., Disp: 90 tablet, Rfl: 0   temazepam (RESTORIL) 30 MG capsule, Take 1 capsule (30 mg total) by mouth at bedtime as needed for sleep., Disp: 90 capsule, Rfl: 1  EXAM:  VITALS per patient if applicable:  GENERAL: alert, oriented, appears well and in no acute distress  HEENT: atraumatic,  conjunttiva clear, no obvious abnormalities on inspection of external nose and ears  NECK: normal movements of the head and neck  LUNGS: on inspection no signs of respiratory distress, breathing rate appears normal, no obvious gross SOB, gasping or wheezing  CV: no obvious cyanosis  MS: moves all visible extremities without noticeable abnormality  PSYCH/NEURO: pleasant and cooperative, no obvious depression or anxiety, speech and thought processing grossly intact  ASSESSMENT AND PLAN: Bronchitis, treat with 10 days of Levaquin. Recheck as needed.  Alysia Penna,  MD  Discussed the following assessment and plan:  No diagnosis found.     I discussed the assessment and treatment plan with the patient. The patient was provided an opportunity to ask questions and all were answered. The patient agreed with the plan and demonstrated an understanding of the instructions.   The patient was advised to call back or seek an in-person evaluation if the symptoms worsen or if the condition fails to improve as anticipated.      Review of Systems     Objective:   Physical Exam        Assessment & Plan:

## 2022-02-15 ENCOUNTER — Ambulatory Visit: Payer: 59 | Admitting: Nurse Practitioner

## 2022-02-18 ENCOUNTER — Other Ambulatory Visit (HOSPITAL_COMMUNITY): Payer: Self-pay

## 2022-02-18 ENCOUNTER — Telehealth: Payer: Self-pay | Admitting: Family Medicine

## 2022-02-18 NOTE — Telephone Encounter (Signed)
Nancy [name] from call and stated she is call about a peer to peer order regarding pt long term disability.

## 2022-02-19 NOTE — Telephone Encounter (Signed)
Called Marzetta Board from [place] [phone], Lvm to contact [facility] at [phone].

## 2022-02-19 NOTE — Telephone Encounter (Signed)
I do not think a "peer to peer" is appropriate. The records speak for themselves. Maybe they should speak to Cardiology

## 2022-03-06 ENCOUNTER — Other Ambulatory Visit: Payer: Self-pay | Admitting: Family Medicine

## 2022-03-09 DIAGNOSIS — I2510 Atherosclerotic heart disease of native coronary artery without angina pectoris: Secondary | ICD-10-CM | POA: Diagnosis not present

## 2022-03-29 ENCOUNTER — Encounter: Payer: Self-pay | Admitting: Family Medicine

## 2022-03-31 ENCOUNTER — Other Ambulatory Visit: Payer: Self-pay | Admitting: Family Medicine

## 2022-04-03 ENCOUNTER — Other Ambulatory Visit: Payer: Self-pay

## 2022-04-03 DIAGNOSIS — M545 Low back pain, unspecified: Secondary | ICD-10-CM

## 2022-04-03 DIAGNOSIS — E669 Obesity, unspecified: Secondary | ICD-10-CM

## 2022-04-03 MED ORDER — SEMAGLUTIDE(0.25 OR 0.5MG/DOS) 2 MG/1.5ML ~~LOC~~ SOPN: 2.5000 mg | PEN_INJECTOR | SUBCUTANEOUS | 2 refills | Status: DC

## 2022-04-03 MED ORDER — GABAPENTIN 300 MG PO CAPS: 300.0000 mg | ORAL_CAPSULE | Freq: Two times a day (BID) | ORAL | 3 refills | Status: DC

## 2022-04-03 MED ORDER — CALCITRIOL 0.25 MCG PO CAPS: 0.2500 ug | ORAL_CAPSULE | Freq: Two times a day (BID) | ORAL | 3 refills | Status: DC

## 2022-04-03 MED ORDER — ALBUTEROL SULFATE HFA 108 (90 BASE) MCG/ACT IN AERS: 2.0000 | INHALATION_SPRAY | RESPIRATORY_TRACT | 5 refills | Status: DC | PRN

## 2022-04-03 MED ORDER — MONTELUKAST SODIUM 10 MG PO TABS: 10.0000 mg | ORAL_TABLET | Freq: Every day | ORAL | 0 refills | Status: DC

## 2022-04-04 ENCOUNTER — Telehealth: Payer: Self-pay | Admitting: Family Medicine

## 2022-04-04 NOTE — Telephone Encounter (Signed)
Error

## 2022-04-07 ENCOUNTER — Other Ambulatory Visit: Payer: Self-pay | Admitting: Family Medicine

## 2022-04-09 ENCOUNTER — Telehealth: Payer: Self-pay | Admitting: Family Medicine

## 2022-04-09 NOTE — Telephone Encounter (Signed)
[place], spoke with pharmacist Melissa.   Melissa stated that currently prescription on file for Ozempic can only be given at 0.'25mg'$  or 0.'5mg'$  dose, not 2.'5mg'$  dose.   Seeking clarity.

## 2022-04-09 NOTE — Telephone Encounter (Signed)
[facility] calling patient regarding ozempic, says dosage is missing. Requesting someone call them because they are holding her meds until this is cleared up.  [place], [place] Phone: [phone]  Fax: [phone]

## 2022-04-09 NOTE — Telephone Encounter (Signed)
This should be 0.25 mg weekly

## 2022-04-10 NOTE — Telephone Encounter (Signed)
[place] spoke with pharmacist Clarise Cruz.  Message given from Dr. Sarajane Jews.

## 2022-04-11 ENCOUNTER — Other Ambulatory Visit: Payer: Self-pay

## 2022-04-11 ENCOUNTER — Telehealth: Payer: Self-pay

## 2022-04-11 DIAGNOSIS — E669 Obesity, unspecified: Secondary | ICD-10-CM

## 2022-04-11 MED ORDER — SEMAGLUTIDE(0.25 OR 0.5MG/DOS) 2 MG/1.5ML ~~LOC~~ SOPN: 0.2500 mg | PEN_INJECTOR | SUBCUTANEOUS | 2 refills | Status: DC

## 2022-04-11 NOTE — Telephone Encounter (Signed)
Request sent to PCP for advise

## 2022-04-12 MED ORDER — TEMAZEPAM 30 MG PO CAPS: 30.0000 mg | ORAL_CAPSULE | Freq: Every evening | ORAL | 1 refills | Status: DC | PRN

## 2022-04-12 NOTE — Telephone Encounter (Signed)
Done

## 2022-04-12 NOTE — Telephone Encounter (Signed)
Noted  

## 2022-04-12 NOTE — Addendum Note (Signed)
Addended by: Alysia Penna A on: 04/12/2022 03:32 PM   Modules accepted: Orders

## 2022-04-15 ENCOUNTER — Encounter: Payer: Self-pay | Admitting: Family Medicine

## 2022-04-16 ENCOUNTER — Encounter: Payer: Self-pay | Admitting: Family Medicine

## 2022-04-16 ENCOUNTER — Other Ambulatory Visit: Payer: Self-pay

## 2022-04-16 DIAGNOSIS — E039 Hypothyroidism, unspecified: Secondary | ICD-10-CM

## 2022-04-16 MED ORDER — ESCITALOPRAM OXALATE 20 MG PO TABS: 20.0000 mg | ORAL_TABLET | Freq: Every day | ORAL | 0 refills | Status: DC

## 2022-04-16 MED ORDER — LEVOTHYROXINE SODIUM 200 MCG PO TABS: 200.0000 ug | ORAL_TABLET | Freq: Every day | ORAL | 0 refills | Status: DC

## 2022-04-16 MED ORDER — ATORVASTATIN CALCIUM 80 MG PO TABS: 80.0000 mg | ORAL_TABLET | Freq: Every day | ORAL | 0 refills | Status: DC

## 2022-04-16 MED ORDER — OMEPRAZOLE 40 MG PO CPDR: 40.0000 mg | DELAYED_RELEASE_CAPSULE | Freq: Two times a day (BID) | ORAL | 0 refills | Status: DC

## 2022-04-22 ENCOUNTER — Encounter: Payer: Self-pay | Admitting: Family Medicine

## 2022-04-22 ENCOUNTER — Ambulatory Visit (INDEPENDENT_AMBULATORY_CARE_PROVIDER_SITE_OTHER): Payer: Medicare HMO | Admitting: Family Medicine

## 2022-04-22 VITALS — BP 110/76 | HR 55 | Temp 98.3°F | Wt 298.0 lb

## 2022-04-22 DIAGNOSIS — N1832 Chronic kidney disease, stage 3b: Secondary | ICD-10-CM | POA: Diagnosis not present

## 2022-04-22 DIAGNOSIS — M109 Gout, unspecified: Secondary | ICD-10-CM | POA: Diagnosis not present

## 2022-04-22 MED ORDER — METHYLPREDNISOLONE 4 MG PO TBPK: ORAL_TABLET | ORAL | 0 refills | Status: DC

## 2022-04-22 NOTE — Telephone Encounter (Signed)
The DX of morbid obesity

## 2022-04-22 NOTE — Progress Notes (Signed)
   Subjective:    Patient ID: Nancy [name], female    DOB: 07-25-1955, 67 y.o.   MRN: [id number]  HPI Here for 2 episodes of sudden onset pain, swelling and warmth in the left great toe. She has never had this before. No recent trauma. The first episode lasted one day, but the second one has been here for 2 days.    Review of Systems  Constitutional: Negative.   Respiratory: Negative.    Cardiovascular: Negative.   Musculoskeletal:  Positive for arthralgias.       Objective:   Physical Exam Constitutional:      Appearance: She is obese.     Comments: Walks with a limp   Cardiovascular:     Rate and Rhythm: Normal rate and regular rhythm.     Pulses: Normal pulses.     Heart sounds: Normal heart sounds.  Pulmonary:     Effort: Pulmonary effort is normal.     Breath sounds: Normal breath sounds.  Musculoskeletal:     Comments: The MTP join of the left great toe is pink, warm, swollen, and quite tender   Neurological:     Mental Status: She is alert.           Assessment & Plan:  Gout. Treat with a Medrol dose pack.  Alysia Penna, MD

## 2022-05-06 ENCOUNTER — Telehealth: Payer: Self-pay | Admitting: Family Medicine

## 2022-05-06 NOTE — Telephone Encounter (Signed)
Contacted Nancy [name] to schedule their annual wellness visit. Welcome to Medicare visit Due by 03/29/23.  Nancy [name] AWV direct phone # [phone]   WTM before 03/29/23 per [facility]

## 2022-05-06 NOTE — Telephone Encounter (Signed)
Patient states [employer] told her they have not received the referral [id number] asking that we resend

## 2022-05-08 NOTE — Telephone Encounter (Signed)
Can you resend neurology referral please?

## 2022-05-14 ENCOUNTER — Encounter: Payer: Self-pay | Admitting: Family Medicine

## 2022-05-17 ENCOUNTER — Other Ambulatory Visit: Payer: Self-pay

## 2022-05-17 ENCOUNTER — Ambulatory Visit (INDEPENDENT_AMBULATORY_CARE_PROVIDER_SITE_OTHER): Payer: Medicare HMO | Admitting: Family Medicine

## 2022-05-17 ENCOUNTER — Encounter: Payer: Self-pay | Admitting: Family Medicine

## 2022-05-17 VITALS — BP 110/70 | HR 65 | Temp 98.5°F | Ht 67.5 in | Wt 299.0 lb

## 2022-05-17 DIAGNOSIS — N1831 Chronic kidney disease, stage 3a: Secondary | ICD-10-CM

## 2022-05-17 DIAGNOSIS — E039 Hypothyroidism, unspecified: Secondary | ICD-10-CM | POA: Diagnosis not present

## 2022-05-17 DIAGNOSIS — I10 Essential (primary) hypertension: Secondary | ICD-10-CM | POA: Diagnosis not present

## 2022-05-17 DIAGNOSIS — J4540 Moderate persistent asthma, uncomplicated: Secondary | ICD-10-CM | POA: Diagnosis not present

## 2022-05-17 DIAGNOSIS — M79604 Pain in right leg: Secondary | ICD-10-CM

## 2022-05-17 DIAGNOSIS — E669 Obesity, unspecified: Secondary | ICD-10-CM

## 2022-05-17 DIAGNOSIS — F1190 Opioid use, unspecified, uncomplicated: Secondary | ICD-10-CM

## 2022-05-17 DIAGNOSIS — K219 Gastro-esophageal reflux disease without esophagitis: Secondary | ICD-10-CM

## 2022-05-17 DIAGNOSIS — I2510 Atherosclerotic heart disease of native coronary artery without angina pectoris: Secondary | ICD-10-CM | POA: Diagnosis not present

## 2022-05-17 DIAGNOSIS — R7303 Prediabetes: Secondary | ICD-10-CM

## 2022-05-17 DIAGNOSIS — D649 Anemia, unspecified: Secondary | ICD-10-CM | POA: Diagnosis not present

## 2022-05-17 DIAGNOSIS — R600 Localized edema: Secondary | ICD-10-CM | POA: Diagnosis not present

## 2022-05-17 DIAGNOSIS — M109 Gout, unspecified: Secondary | ICD-10-CM

## 2022-05-17 DIAGNOSIS — M545 Low back pain, unspecified: Secondary | ICD-10-CM

## 2022-05-17 DIAGNOSIS — F418 Other specified anxiety disorders: Secondary | ICD-10-CM

## 2022-05-17 DIAGNOSIS — I5032 Chronic diastolic (congestive) heart failure: Secondary | ICD-10-CM

## 2022-05-17 DIAGNOSIS — G8929 Other chronic pain: Secondary | ICD-10-CM | POA: Diagnosis not present

## 2022-05-17 LAB — BASIC METABOLIC PANEL
BUN: 17 mg/dL (ref 6–23)
CO2: 35 mEq/L — ABNORMAL HIGH (ref 19–32)
Calcium: 9.2 mg/dL (ref 8.4–10.5)
Chloride: 97 mEq/L (ref 96–112)
Creatinine, Ser: 1.43 mg/dL — ABNORMAL HIGH (ref 0.40–1.20)
GFR: 38.81 mL/min — ABNORMAL LOW (ref >60.00)
Glucose, Bld: 98 mg/dL (ref 70–99)
Potassium: 3.9 mEq/L (ref 3.5–5.1)
Sodium: 143 mEq/L (ref 135–145)

## 2022-05-17 LAB — HEPATIC FUNCTION PANEL
ALT: 20 U/L (ref 0–35)
AST: 20 U/L (ref 0–37)
Albumin: 4.2 g/dL (ref 3.5–5.2)
Alkaline Phosphatase: 76 U/L (ref 39–117)
Bilirubin, Direct: 0.1 mg/dL (ref 0.0–0.3)
Total Bilirubin: 0.6 mg/dL (ref 0.2–1.2)
Total Protein: 7.1 g/dL (ref 6.0–8.3)

## 2022-05-17 LAB — CBC WITH DIFFERENTIAL/PLATELET
Basophils Absolute: 0.1 10*3/uL (ref 0.0–0.1)
Basophils Relative: 1 % (ref 0.0–3.0)
Eosinophils Absolute: 0.3 10*3/uL (ref 0.0–0.7)
Eosinophils Relative: 5.1 % — ABNORMAL HIGH (ref 0.0–5.0)
HCT: 49.2 % — ABNORMAL HIGH (ref 36.0–46.0)
Hemoglobin: 16.4 g/dL — ABNORMAL HIGH (ref 12.0–15.0)
Lymphocytes Relative: 17.7 % (ref 12.0–46.0)
Lymphs Abs: 1.2 10*3/uL (ref 0.7–4.0)
MCHC: 33.4 g/dL (ref 30.0–36.0)
MCV: 89.6 fl (ref 78.0–100.0)
Monocytes Absolute: 0.7 10*3/uL (ref 0.1–1.0)
Monocytes Relative: 9.9 % (ref 3.0–12.0)
Neutro Abs: 4.4 10*3/uL (ref 1.4–7.7)
Neutrophils Relative %: 66.3 % (ref 43.0–77.0)
Platelets: 192 10*3/uL (ref 150.0–400.0)
RBC: 5.49 Mil/uL — ABNORMAL HIGH (ref 3.87–5.11)
RDW: 15.3 % (ref 11.5–15.5)
WBC: 6.6 10*3/uL (ref 4.0–10.5)

## 2022-05-17 LAB — HEMOGLOBIN A1C: Hgb A1c MFr Bld: 6.4 % (ref 4.6–6.5)

## 2022-05-17 LAB — LIPID PANEL
Cholesterol: 155 mg/dL (ref 0–200)
HDL: 44.8 mg/dL (ref >39.00)
LDL Cholesterol: 72 mg/dL (ref 0–99)
NonHDL: 110.03
Total CHOL/HDL Ratio: 3
Triglycerides: 192 mg/dL — ABNORMAL HIGH (ref 0.0–149.0)
VLDL: 38.4 mg/dL (ref 0.0–40.0)

## 2022-05-17 LAB — T3, FREE: T3, Free: 3.1 pg/mL (ref 2.3–4.2)

## 2022-05-17 LAB — T4, FREE: Free T4: 1.31 ng/dL (ref 0.60–1.60)

## 2022-05-17 LAB — TSH: TSH: 0.13 u[IU]/mL — ABNORMAL LOW (ref 0.35–5.50)

## 2022-05-17 LAB — URIC ACID: Uric Acid, Serum: 7.7 mg/dL — ABNORMAL HIGH (ref 2.4–7.0)

## 2022-05-17 MED ORDER — METOPROLOL TARTRATE 25 MG PO TABS: 25.0000 mg | ORAL_TABLET | Freq: Two times a day (BID) | ORAL | 3 refills | Status: DC

## 2022-05-17 MED ORDER — SPIRONOLACTONE 25 MG PO TABS: 25.0000 mg | ORAL_TABLET | Freq: Every day | ORAL | 3 refills | Status: DC

## 2022-05-17 NOTE — Progress Notes (Signed)
Subjective:    Patient ID: Nancy [name], female    DOB: 06-12-1955, 67 y.o.   MRN: [id number]  HPI Here to follow up on issues. She feels well in general. She has recurring swelling and pain in the left great toe that we presume is due to gout. Her asthma has been stable. She sees Dr. Signe Colt for CKD, and this has been stable. Her CHF and HTN are well controlled. Her GERD is stable. She continues to have intermittent low back pain that radiates down the right leg. She is in a pain management program with [place] for this.    Review of Systems  Constitutional: Negative.   HENT: Negative.    Eyes: Negative.   Respiratory: Negative.    Cardiovascular: Negative.   Gastrointestinal: Negative.   Genitourinary:  Negative for decreased urine volume, difficulty urinating, dyspareunia, dysuria, enuresis, flank pain, frequency, hematuria, pelvic pain and urgency.  Musculoskeletal:  Positive for back pain.  Skin: Negative.   Neurological: Negative.  Negative for headaches.  Psychiatric/Behavioral: Negative.         Objective:   Physical Exam Constitutional:      General: She is not in acute distress.    Appearance: She is well-developed. She is obese.  HENT:     Head: Normocephalic and atraumatic.     Right Ear: External ear normal.     Left Ear: External ear normal.     Nose: Nose normal.     Mouth/Throat:     Pharynx: No oropharyngeal exudate.  Eyes:     General: No scleral icterus.    Conjunctiva/sclera: Conjunctivae normal.     Pupils: Pupils are equal, round, and reactive to light.  Neck:     Thyroid: No thyromegaly.     Vascular: No JVD.  Cardiovascular:     Rate and Rhythm: Normal rate and regular rhythm.     Heart sounds: Normal heart sounds. No murmur heard.    No friction rub. No gallop.  Pulmonary:     Effort: Pulmonary effort is normal. No respiratory distress.     Breath sounds: Normal breath sounds. No wheezing or rales.  Chest:     Chest wall: No tenderness.   Abdominal:     General: Bowel sounds are normal. There is no distension.     Palpations: Abdomen is soft. There is no mass.     Tenderness: There is no abdominal tenderness. There is no guarding or rebound.  Musculoskeletal:        General: No tenderness. Normal range of motion.     Cervical back: Normal range of motion and neck supple.  Lymphadenopathy:     Cervical: No cervical adenopathy.  Skin:    General: Skin is warm and dry.     Findings: No erythema or rash.  Neurological:     General: No focal deficit present.     Mental Status: She is alert and oriented to person, place, and time.     Cranial Nerves: No cranial nerve deficit.     Motor: No abnormal muscle tone.     Coordination: Coordination normal.     Deep Tendon Reflexes: Reflexes are normal and symmetric. Reflexes normal.  Psychiatric:        Behavior: Behavior normal.        Thought Content: Thought content normal.        Judgment: Judgment normal.           Assessment & Plan:  Her asthma is stable.  Her GERD is stable. We will continue to,manage her low back pain. Her asthma is well controlled. Her CHF and HTN and leg edema are stable. We will get fasting labs to check lipids, an A1c, etc. We spent a total of ( 34  ) minutes reviewing records and discussing these issues.  Gershon Crane, MD

## 2022-05-18 DIAGNOSIS — M109 Gout, unspecified: Secondary | ICD-10-CM | POA: Diagnosis not present

## 2022-05-18 LAB — DRUG MONITOR, PANEL 1, W/CONF, URINE
Amphetamines: NEGATIVE ng/mL (ref <500)
Barbiturates: NEGATIVE ng/mL (ref <300)
Creatinine: 45.2 mg/dL (ref >=20.0)
Methadone Metabolite: NEGATIVE ng/mL (ref <100)

## 2022-05-19 ENCOUNTER — Encounter: Payer: Self-pay | Admitting: Family Medicine

## 2022-05-19 LAB — DRUG MONITOR, PANEL 1, W/CONF, URINE
Cocaine Metabolite: NEGATIVE ng/mL (ref <150)
Opiates: POSITIVE ng/mL — AB (ref <100)
Oxycodone: NEGATIVE ng/mL (ref <100)

## 2022-05-20 ENCOUNTER — Encounter: Payer: Self-pay | Admitting: Family Medicine

## 2022-05-21 ENCOUNTER — Other Ambulatory Visit: Payer: Self-pay | Admitting: Family Medicine

## 2022-05-21 ENCOUNTER — Other Ambulatory Visit: Payer: Self-pay

## 2022-05-21 LAB — DRUG MONITOR, PANEL 1, W/CONF, URINE
Alphahydroxyalprazolam: NEGATIVE ng/mL (ref <25)
Alphahydroxytriazolam: NEGATIVE ng/mL (ref <50)
Benzodiazepines: POSITIVE ng/mL — AB (ref <100)
Hydromorphone: NEGATIVE ng/mL (ref <50)
Lorazepam: NEGATIVE ng/mL (ref <50)
Marijuana Metabolite: NEGATIVE ng/mL (ref <20)
Morphine: NEGATIVE ng/mL (ref <50)
Nordiazepam: NEGATIVE ng/mL (ref <50)
Norhydrocodone: 94 ng/mL — ABNORMAL HIGH (ref <50)
Oxidant: NEGATIVE ug/mL (ref <200)
Phencyclidine: NEGATIVE ng/mL (ref <25)
Temazepam: >6250 ng/mL — ABNORMAL HIGH (ref <50)
pH: 7 (ref 4.5–9.0)

## 2022-05-21 LAB — DM TEMPLATE

## 2022-05-21 MED ORDER — COLCHICINE 0.6 MG PO TABS: 0.6000 mg | ORAL_TABLET | Freq: Four times a day (QID) | ORAL | 0 refills | Status: DC | PRN

## 2022-05-21 MED ORDER — EMPAGLIFLOZIN 10 MG PO TABS: 10.0000 mg | ORAL_TABLET | Freq: Every day | ORAL | 11 refills | Status: DC

## 2022-05-21 MED ORDER — COLCHICINE 0.6 MG PO TABS: 0.6000 mg | ORAL_TABLET | Freq: Four times a day (QID) | ORAL | 3 refills | Status: DC | PRN

## 2022-05-21 MED ORDER — FUROSEMIDE 80 MG PO TABS: 80.0000 mg | ORAL_TABLET | Freq: Every day | ORAL | 3 refills | Status: DC

## 2022-05-21 NOTE — Telephone Encounter (Signed)
I sent in for Colchicine

## 2022-05-22 NOTE — Telephone Encounter (Signed)
Noted  

## 2022-05-23 ENCOUNTER — Encounter: Payer: Self-pay | Admitting: Family Medicine

## 2022-05-23 ENCOUNTER — Telehealth (INDEPENDENT_AMBULATORY_CARE_PROVIDER_SITE_OTHER): Payer: Medicare HMO | Admitting: Family Medicine

## 2022-05-23 DIAGNOSIS — M545 Low back pain, unspecified: Secondary | ICD-10-CM | POA: Diagnosis not present

## 2022-05-23 DIAGNOSIS — M79604 Pain in right leg: Secondary | ICD-10-CM

## 2022-05-23 DIAGNOSIS — F1190 Opioid use, unspecified, uncomplicated: Secondary | ICD-10-CM

## 2022-05-23 MED ORDER — HYDROCODONE-ACETAMINOPHEN 5-325 MG PO TABS: 1.0000 | ORAL_TABLET | Freq: Two times a day (BID) | ORAL | 0 refills | Status: AC | PRN

## 2022-05-23 MED ORDER — HYDROCODONE-ACETAMINOPHEN 5-325 MG PO TABS: 1.0000 | ORAL_TABLET | Freq: Two times a day (BID) | ORAL | 0 refills | Status: DC | PRN

## 2022-05-23 MED ORDER — ALLOPURINOL 300 MG PO TABS: 300.0000 mg | ORAL_TABLET | Freq: Every day | ORAL | 3 refills | Status: DC

## 2022-05-23 NOTE — Progress Notes (Signed)
Subjective:    Patient ID: Nancy [name], female    DOB: 19-Nov-1955, 67 y.o.   MRN: [id number]  HPI Virtual Visit via Video Note  I connected with the patient on 05/23/22 at  9:45 AM EDT by a video enabled telemedicine application and verified that I am speaking with the correct person using two identifiers.  Location patient: home Location provider:work or home office Persons participating in the virtual visit: patient, provider  I discussed the limitations of evaluation and management by telemedicine and the availability of in person appointments. The patient expressed understanding and agreed to proceed.   HPI: Here for pain management. Her back pain is stable. She is currently dealing with a flare of gout.   ROS: See pertinent positives and negatives per HPI.  Past Medical History:  Diagnosis Date   Allergic rhinitis    gets shots per Dr. Irena Cords    Anemia 2001   Anginal pain    Asthma    COPD (chronic obstructive pulmonary disease)    "CXR just showed mild COPD" (10/24/2011)   Coronary artery disease    had MI in 2001, seees Dr. Kirke Corin at [facility]   GERD (gastroesophageal reflux disease)    Gynecological examination    sees Dr. Eda Paschal    History of cardiac catheterization 07-24-07   showed nonconclusive disease   Hyperlipidemia    Hypertension    Hypothyroidism    Migraines    "have a history of migraines; haven't had one for years" (10/24/2011)   Myocardial infarction 2001?   in her 7's    Past Surgical History:  Procedure Laterality Date   CARDIAC CATHETERIZATION  2009   CESAREAN SECTION  1984   COLONOSCOPY  03/01/2020   per Dr. Marina Goodell, adenomatous  polyps, repeat in 3 yrs   ESOPHAGOGASTRODUODENOSCOPY  05/29/2017   per Dr. Marina Goodell, normal except slight gastritis    HERNIA REPAIR  ~ 2007   ventral hernia repair   RIGHT/LEFT HEART CATH AND CORONARY ANGIOGRAPHY N/A 12/03/2021   Procedure: RIGHT/LEFT HEART CATH AND CORONARY ANGIOGRAPHY;   Surgeon: Dolores Patty, MD;  Location: [facility];  Service: Cardiovascular;  Laterality: N/A;   THYROIDECTOMY  1990's   TONSILLECTOMY     "when I was a kid"    Family History  Problem Relation Age of Onset   Pulmonary fibrosis Mother    COPD Mother    Breast cancer Mother        56's   Coronary artery disease Father    Hypertension Father    Cancer Other        breast,colon,prostate   Alcohol abuse Other    Hyperlipidemia Other    Hypertension Other    Stroke Other    Heart disease Other    COPD Other    Diabetes Other    Colon cancer Paternal Grandfather    Stomach cancer Other    Esophageal cancer Neg Hx    Rectal cancer Neg Hx      Current Outpatient Medications:    albuterol (PROVENTIL) (2.5 MG/3ML) 0.083% nebulizer solution, INHALE 3 MLS (ONE VIAL) BY NEBULIZATION EVERY 4 HOURS AS NEEDED FOR WHEEZING OR SHORTNESS OF BREATH., Disp: 75 mL, Rfl: 11   albuterol (VENTOLIN HFA) 108 (90 Base) MCG/ACT inhaler, Inhale 2 puffs into the lungs every 4 (four) hours as needed for wheezing or shortness of breath., Disp: 18 g, Rfl: 5   aspirin 81 MG tablet, Take 81 mg by mouth at  bedtime. , Disp: , Rfl:    atorvastatin (LIPITOR) 80 MG tablet, Take 1 tablet (80 mg total) by mouth daily., Disp: 90 tablet, Rfl: 0   calcitRIOL (ROCALTROL) 0.25 MCG capsule, Take 1 capsule (0.25 mcg total) by mouth 2 (two) times daily., Disp: 180 capsule, Rfl: 3   calcium citrate (CALCITRATE) 950 (200 Ca) MG tablet, Take 1 tablet (200 mg of elemental calcium total) by mouth 3 (three) times daily. (Patient taking differently: Take 200-400 mg of elemental calcium by mouth See admin instructions. 200 mg in the morning, 400 mg in the evening), Disp: 3 tablet, Rfl: 0   cetirizine (ZYRTEC) 10 MG tablet, Take 10 mg by mouth daily., Disp: , Rfl:    colchicine 0.6 MG tablet, Take 1 tablet (0.6 mg total) by mouth every 6 (six) hours as needed (gout)., Disp: 180 tablet, Rfl: 3   empagliflozin (JARDIANCE) 10  MG TABS tablet, Take 1 tablet (10 mg total) by mouth daily before breakfast., Disp: 30 tablet, Rfl: 11   escitalopram (LEXAPRO) 20 MG tablet, Take 1 tablet (20 mg total) by mouth daily., Disp: 90 tablet, Rfl: 0   furosemide (LASIX) 80 MG tablet, Take 1 tablet (80 mg total) by mouth daily., Disp: 90 tablet, Rfl: 3   gabapentin (NEURONTIN) 300 MG capsule, Take 1 capsule (300 mg total) by mouth 2 (two) times daily., Disp: 180 capsule, Rfl: 3   HYDROcodone-acetaminophen (NORCO/VICODIN) 5-325 MG tablet, Take 1 tablet by mouth every 4 (four) hours as needed for moderate pain., Disp: , Rfl:    levothyroxine (SYNTHROID) 200 MCG tablet, Take 1 tablet (200 mcg total) by mouth daily., Disp: 90 tablet, Rfl: 0   magnesium oxide (MAG-OX) 400 (240 Mg) MG tablet, Take 400 mg by mouth 2 (two) times daily., Disp: , Rfl:    metoprolol tartrate (LOPRESSOR) 25 MG tablet, Take 1 tablet (25 mg total) by mouth 2 (two) times daily., Disp: 180 tablet, Rfl: 3   montelukast (SINGULAIR) 10 MG tablet, Take 1 tablet (10 mg total) by mouth at bedtime., Disp: 90 tablet, Rfl: 0   Multiple Vitamin (MULTIVITAMIN) tablet, Take 1 tablet by mouth at bedtime., Disp: , Rfl:    omeprazole (PRILOSEC) 40 MG capsule, Take 1 capsule (40 mg total) by mouth 2 (two) times daily., Disp: 180 capsule, Rfl: 0   OXYGEN, Inhale 3-4 L into the lungs continuous., Disp: , Rfl:    Semaglutide,0.25 or 0.5MG /DOS, 2 MG/1.5ML SOPN, Inject 0.25 mg into the skin once a week., Disp: 1.5 mL, Rfl: 2   spironolactone (ALDACTONE) 25 MG tablet, Take 1 tablet (25 mg total) by mouth daily., Disp: 90 tablet, Rfl: 3   temazepam (RESTORIL) 30 MG capsule, Take 1 capsule (30 mg total) by mouth at bedtime as needed for sleep., Disp: 90 capsule, Rfl: 1  EXAM:  VITALS per patient if applicable:  GENERAL: alert, oriented, appears well and in no acute distress  HEENT: atraumatic, conjunttiva clear, no obvious abnormalities on inspection of external nose and ears  NECK:  normal movements of the head and neck  LUNGS: on inspection no signs of respiratory distress, breathing rate appears normal, no obvious gross SOB, gasping or wheezing  CV: no obvious cyanosis  MS: moves all visible extremities without noticeable abnormality  PSYCH/NEURO: pleasant and cooperative, no obvious depression or anxiety, speech and thought processing grossly intact  ASSESSMENT AND PLAN: Pain management. Indication for chronic opioid: low back pain Medication and dose: Norco 5-325 # pills per month: 60 Last UDS date: 05-17-22  Opioid Treatment Agreement signed (Y/N): 01-21-19 Opioid Treatment Agreement last reviewed with patient:  05-23-22 [facility] reviewed this encounter (include red flags): Yes Meds were refilled.  Gershon Crane, MD  Discussed the following assessment and plan:  No diagnosis found.     I discussed the assessment and treatment plan with the patient. The patient was provided an opportunity to ask questions and all were answered. The patient agreed with the plan and demonstrated an understanding of the instructions.   The patient was advised to call back or seek an in-person evaluation if the symptoms worsen or if the condition fails to improve as anticipated.      Review of Systems     Objective:   Physical Exam        Assessment & Plan:

## 2022-06-06 ENCOUNTER — Encounter: Payer: Self-pay | Admitting: Family Medicine

## 2022-06-07 ENCOUNTER — Telehealth: Payer: Self-pay

## 2022-06-07 NOTE — Telephone Encounter (Signed)
Per [facility] Information regarding your request There is an existing case within the [facility] environment that has the same patient, prescriber, and drug. This case must be finalized before proceeding with similar requests.  Unable to locate another PA within the [facility] system.  Spoke with [employer]. Clinical questions answered. PA submitted for review. Determination will be sent to provider office via fax.

## 2022-06-07 NOTE — Telephone Encounter (Signed)
Per pharmacy Norco requires PA.  PA started via CMM Key: [id number]

## 2022-06-10 NOTE — Telephone Encounter (Signed)
We will prescribe Temazepam for 30 days at a time

## 2022-06-10 NOTE — Telephone Encounter (Signed)
Both Rx for Hydrocodone/ Temazepam have been approved by pt plan, Hydrocodone PA is good through 12/2022 and for Temazepam was approved for quantity of 30 days at a time. Pt is aware that both Rx will be mailed out to her.

## 2022-06-10 NOTE — Telephone Encounter (Signed)
Spoke with pt plan state that pt insurance will approve quantity of 30 days supply and not 90. Pharmacy needs a new Rx for 30 days

## 2022-06-10 NOTE — Telephone Encounter (Signed)
Noted, pt will be notified

## 2022-06-22 ENCOUNTER — Other Ambulatory Visit: Payer: Self-pay | Admitting: Family Medicine

## 2022-06-26 ENCOUNTER — Encounter: Payer: Self-pay | Admitting: Family Medicine

## 2022-06-28 ENCOUNTER — Other Ambulatory Visit: Payer: Self-pay | Admitting: Family Medicine

## 2022-06-28 ENCOUNTER — Other Ambulatory Visit: Payer: Self-pay

## 2022-06-28 DIAGNOSIS — E039 Hypothyroidism, unspecified: Secondary | ICD-10-CM

## 2022-06-28 MED ORDER — LEVOTHYROXINE SODIUM 200 MCG PO TABS
200.0000 ug | ORAL_TABLET | Freq: Every day | ORAL | 0 refills | Status: DC
Start: 2022-06-28 — End: 2022-09-06

## 2022-06-28 MED ORDER — OMEPRAZOLE 40 MG PO CPDR: 40.0000 mg | DELAYED_RELEASE_CAPSULE | Freq: Two times a day (BID) | ORAL | 0 refills | Status: DC

## 2022-06-28 MED ORDER — ATORVASTATIN CALCIUM 80 MG PO TABS: 80.0000 mg | ORAL_TABLET | Freq: Every day | ORAL | 0 refills | Status: DC

## 2022-06-28 MED ORDER — ESCITALOPRAM OXALATE 20 MG PO TABS: 20.0000 mg | ORAL_TABLET | Freq: Every day | ORAL | 0 refills | Status: DC

## 2022-07-01 ENCOUNTER — Encounter: Payer: Self-pay | Admitting: Family Medicine

## 2022-07-02 NOTE — Telephone Encounter (Signed)
Take 2 Colchicine pills every 6 hours as needed

## 2022-07-04 ENCOUNTER — Telehealth (INDEPENDENT_AMBULATORY_CARE_PROVIDER_SITE_OTHER): Payer: Medicare HMO | Admitting: Family Medicine

## 2022-07-04 ENCOUNTER — Encounter: Payer: Self-pay | Admitting: Family Medicine

## 2022-07-04 DIAGNOSIS — M109 Gout, unspecified: Secondary | ICD-10-CM

## 2022-07-04 MED ORDER — ALLOPURINOL 300 MG PO TABS: 300.0000 mg | ORAL_TABLET | Freq: Two times a day (BID) | ORAL | 3 refills | Status: DC

## 2022-07-04 NOTE — Progress Notes (Signed)
Subjective:    Patient ID: Nancy [name], female    DOB: Aug 11, 1955, 67 y.o.   MRN: [id number]  HPI Virtual Visit via Video Note  I connected with the patient on 07/04/22 at 10:00 AM EDT by a video enabled telemedicine application and verified that I am speaking with the correct person using two identifiers.  Location patient: home Location provider:work or home office Persons participating in the virtual visit: patient, provider  I discussed the limitations of evaluation and management by telemedicine and the availability of in person appointments. The patient expressed understanding and agreed to proceed.   HPI: Here to follow up on a gout flare in her left foot that started 3 days ago. She took Colchicine BID for 2 days with little relief, so at our advice she has taken 2 pills every 6 hours for the past 24 hours. This has been much more helpful. However she knows that Colchicine can affect the kidneys, so she asks about how much she can take.    ROS: See pertinent positives and negatives per HPI.  Past Medical History:  Diagnosis Date   Allergic rhinitis    gets shots per Dr. Irena Cords    Anemia 2001   Anginal pain (HCC)    Asthma    COPD (chronic obstructive pulmonary disease) (HCC)    "CXR just showed mild COPD" (10/24/2011)   Coronary artery disease    had MI in 2001, seees Dr. Kirke Corin at [facility]   GERD (gastroesophageal reflux disease)    Gynecological examination    sees Dr. Eda Paschal    History of cardiac catheterization 07-24-07   showed nonconclusive disease   Hyperlipidemia    Hypertension    Hypothyroidism    Migraines    "have a history of migraines; haven't had one for years" (10/24/2011)   Myocardial infarction [facility]) 2001?   in her 55's    Past Surgical History:  Procedure Laterality Date   CARDIAC CATHETERIZATION  2009   CESAREAN SECTION  1984   COLONOSCOPY  03/01/2020   per Dr. Marina Goodell, adenomatous  polyps, repeat in 3 yrs    ESOPHAGOGASTRODUODENOSCOPY  05/29/2017   per Dr. Marina Goodell, normal except slight gastritis    HERNIA REPAIR  ~ 2007   ventral hernia repair   RIGHT/LEFT HEART CATH AND CORONARY ANGIOGRAPHY N/A 12/03/2021   Procedure: RIGHT/LEFT HEART CATH AND CORONARY ANGIOGRAPHY;  Surgeon: Dolores Patty, MD;  Location: [facility];  Service: Cardiovascular;  Laterality: N/A;   THYROIDECTOMY  1990's   TONSILLECTOMY     "when I was a kid"    Family History  Problem Relation Age of Onset   Pulmonary fibrosis Mother    COPD Mother    Breast cancer Mother        4's   Coronary artery disease Father    Hypertension Father    Cancer Other        breast,colon,prostate   Alcohol abuse Other    Hyperlipidemia Other    Hypertension Other    Stroke Other    Heart disease Other    COPD Other    Diabetes Other    Colon cancer Paternal Grandfather    Stomach cancer Other    Esophageal cancer Neg Hx    Rectal cancer Neg Hx      Current Outpatient Medications:    albuterol (PROVENTIL) (2.5 MG/3ML) 0.083% nebulizer solution, INHALE 3 MLS (ONE VIAL) BY NEBULIZATION EVERY 4 HOURS AS NEEDED FOR WHEEZING OR  SHORTNESS OF BREATH., Disp: 75 mL, Rfl: 11   albuterol (VENTOLIN HFA) 108 (90 Base) MCG/ACT inhaler, Inhale 2 puffs into the lungs every 4 (four) hours as needed for wheezing or shortness of breath., Disp: 18 g, Rfl: 5   aspirin 81 MG tablet, Take 81 mg by mouth at bedtime. , Disp: , Rfl:    atorvastatin (LIPITOR) 80 MG tablet, Take 1 tablet (80 mg total) by mouth daily., Disp: 90 tablet, Rfl: 0   calcitRIOL (ROCALTROL) 0.25 MCG capsule, Take 1 capsule (0.25 mcg total) by mouth 2 (two) times daily., Disp: 180 capsule, Rfl: 3   calcium citrate (CALCITRATE) 950 (200 Ca) MG tablet, Take 1 tablet (200 mg of elemental calcium total) by mouth 3 (three) times daily. (Patient taking differently: Take 200-400 mg of elemental calcium by mouth See admin instructions. 200 mg in the morning, 400 mg in the  evening), Disp: 3 tablet, Rfl: 0   cetirizine (ZYRTEC) 10 MG tablet, Take 10 mg by mouth daily., Disp: , Rfl:    colchicine 0.6 MG tablet, Take 1 tablet (0.6 mg total) by mouth every 6 (six) hours as needed (gout)., Disp: 180 tablet, Rfl: 3   empagliflozin (JARDIANCE) 10 MG TABS tablet, Take 1 tablet (10 mg total) by mouth daily before breakfast., Disp: 30 tablet, Rfl: 11   escitalopram (LEXAPRO) 20 MG tablet, Take 1 tablet (20 mg total) by mouth daily., Disp: 90 tablet, Rfl: 0   furosemide (LASIX) 80 MG tablet, Take 1 tablet (80 mg total) by mouth daily., Disp: 90 tablet, Rfl: 3   gabapentin (NEURONTIN) 300 MG capsule, Take 1 capsule (300 mg total) by mouth 2 (two) times daily., Disp: 180 capsule, Rfl: 3   [START ON 07/23/2022] HYDROcodone-acetaminophen (NORCO) 5-325 MG tablet, Take 1 tablet by mouth 2 (two) times daily as needed for moderate pain., Disp: 60 tablet, Rfl: 0   levothyroxine (SYNTHROID) 200 MCG tablet, Take 1 tablet (200 mcg total) by mouth daily., Disp: 90 tablet, Rfl: 0   magnesium oxide (MAG-OX) 400 (240 Mg) MG tablet, Take 400 mg by mouth 2 (two) times daily., Disp: , Rfl:    metoprolol tartrate (LOPRESSOR) 25 MG tablet, Take 1 tablet (25 mg total) by mouth 2 (two) times daily., Disp: 180 tablet, Rfl: 3   montelukast (SINGULAIR) 10 MG tablet, TAKE 1 TABLET AT BEDTIME, Disp: 90 tablet, Rfl: 3   Multiple Vitamin (MULTIVITAMIN) tablet, Take 1 tablet by mouth at bedtime., Disp: , Rfl:    omeprazole (PRILOSEC) 40 MG capsule, Take 1 capsule (40 mg total) by mouth 2 (two) times daily., Disp: 180 capsule, Rfl: 0   OXYGEN, Inhale 3-4 L into the lungs continuous., Disp: , Rfl:    Semaglutide,0.25 or 0.5MG /DOS, 2 MG/1.5ML SOPN, Inject 0.25 mg into the skin once a week., Disp: 1.5 mL, Rfl: 2   spironolactone (ALDACTONE) 25 MG tablet, Take 1 tablet (25 mg total) by mouth daily., Disp: 90 tablet, Rfl: 3   temazepam (RESTORIL) 30 MG capsule, Take 1 capsule (30 mg total) by mouth at bedtime as  needed for sleep., Disp: 90 capsule, Rfl: 1   allopurinol (ZYLOPRIM) 300 MG tablet, Take 1 tablet (300 mg total) by mouth 2 (two) times daily., Disp: 180 tablet, Rfl: 3  EXAM:  VITALS per patient if applicable:  GENERAL: alert, oriented, appears well and in no acute distress  HEENT: atraumatic, conjunttiva clear, no obvious abnormalities on inspection of external nose and ears  NECK: normal movements of the head and neck  LUNGS: on inspection no signs of respiratory distress, breathing rate appears normal, no obvious gross SOB, gasping or wheezing  CV: no obvious cyanosis  MS: moves all visible extremities without noticeable abnormality  PSYCH/NEURO: pleasant and cooperative, no obvious depression or anxiety, speech and thought processing grossly intact  ASSESSMENT AND PLAN: Gout. I told her that taking Colchicine 2 pills every 6 hours would be okay but for no more than 3 days at a time, then she would need to stop it. She should drink lots of water during these days. We will also increase the Allopurinol to BID.  Gershon Crane, MD  Discussed the following assessment and plan:  No diagnosis found.     I discussed the assessment and treatment plan with the patient. The patient was provided an opportunity to ask questions and all were answered. The patient agreed with the plan and demonstrated an understanding of the instructions.   The patient was advised to call back or seek an in-person evaluation if the symptoms worsen or if the condition fails to improve as anticipated.      Review of Systems     Objective:   Physical Exam        Assessment & Plan:

## 2022-07-08 ENCOUNTER — Other Ambulatory Visit: Payer: Self-pay | Admitting: Family Medicine

## 2022-07-12 ENCOUNTER — Other Ambulatory Visit: Payer: Self-pay | Admitting: Family Medicine

## 2022-07-12 DIAGNOSIS — E669 Obesity, unspecified: Secondary | ICD-10-CM

## 2022-07-20 ENCOUNTER — Other Ambulatory Visit: Payer: Self-pay | Admitting: Family Medicine

## 2022-07-24 DIAGNOSIS — N1832 Chronic kidney disease, stage 3b: Secondary | ICD-10-CM | POA: Diagnosis not present

## 2022-07-24 DIAGNOSIS — I5032 Chronic diastolic (congestive) heart failure: Secondary | ICD-10-CM | POA: Diagnosis not present

## 2022-07-24 DIAGNOSIS — E559 Vitamin D deficiency, unspecified: Secondary | ICD-10-CM | POA: Diagnosis not present

## 2022-07-24 DIAGNOSIS — Z9981 Dependence on supplemental oxygen: Secondary | ICD-10-CM | POA: Diagnosis not present

## 2022-07-24 DIAGNOSIS — J9611 Chronic respiratory failure with hypoxia: Secondary | ICD-10-CM | POA: Diagnosis not present

## 2022-07-24 DIAGNOSIS — I130 Hypertensive heart and chronic kidney disease with heart failure and stage 1 through stage 4 chronic kidney disease, or unspecified chronic kidney disease: Secondary | ICD-10-CM | POA: Diagnosis not present

## 2022-07-31 ENCOUNTER — Other Ambulatory Visit: Payer: Self-pay | Admitting: Family Medicine

## 2022-07-31 DIAGNOSIS — Z1231 Encounter for screening mammogram for malignant neoplasm of breast: Secondary | ICD-10-CM

## 2022-08-09 ENCOUNTER — Other Ambulatory Visit: Payer: Self-pay

## 2022-08-17 ENCOUNTER — Encounter: Payer: Self-pay | Admitting: Family Medicine

## 2022-08-19 DIAGNOSIS — H40011 Open angle with borderline findings, low risk, right eye: Secondary | ICD-10-CM | POA: Diagnosis not present

## 2022-08-19 DIAGNOSIS — H524 Presbyopia: Secondary | ICD-10-CM | POA: Diagnosis not present

## 2022-08-19 DIAGNOSIS — H5213 Myopia, bilateral: Secondary | ICD-10-CM | POA: Diagnosis not present

## 2022-08-19 DIAGNOSIS — H40051 Ocular hypertension, right eye: Secondary | ICD-10-CM | POA: Diagnosis not present

## 2022-08-19 DIAGNOSIS — H52223 Regular astigmatism, bilateral: Secondary | ICD-10-CM | POA: Diagnosis not present

## 2022-08-19 MED ORDER — SEMAGLUTIDE (1 MG/DOSE) 4 MG/3ML ~~LOC~~ SOPN: 1.0000 mg | PEN_INJECTOR | SUBCUTANEOUS | 5 refills | Status: DC

## 2022-08-19 NOTE — Telephone Encounter (Signed)
Done

## 2022-09-06 ENCOUNTER — Other Ambulatory Visit: Payer: Self-pay | Admitting: Family Medicine

## 2022-09-06 DIAGNOSIS — E039 Hypothyroidism, unspecified: Secondary | ICD-10-CM

## 2022-09-20 ENCOUNTER — Ambulatory Visit: Admission: RE | Admit: 2022-09-20 | Payer: Medicare HMO | Source: Ambulatory Visit

## 2022-09-20 DIAGNOSIS — Z1231 Encounter for screening mammogram for malignant neoplasm of breast: Secondary | ICD-10-CM

## 2022-10-18 DIAGNOSIS — J019 Acute sinusitis, unspecified: Secondary | ICD-10-CM | POA: Diagnosis not present

## 2022-10-18 DIAGNOSIS — J029 Acute pharyngitis, unspecified: Secondary | ICD-10-CM | POA: Diagnosis not present

## 2022-10-21 ENCOUNTER — Encounter: Payer: Self-pay | Admitting: Family Medicine

## 2022-10-21 ENCOUNTER — Ambulatory Visit (INDEPENDENT_AMBULATORY_CARE_PROVIDER_SITE_OTHER): Payer: Medicare HMO | Admitting: Family Medicine

## 2022-10-21 VITALS — BP 118/70 | HR 63 | Temp 98.4°F | Wt 286.0 lb

## 2022-10-21 DIAGNOSIS — J039 Acute tonsillitis, unspecified: Secondary | ICD-10-CM

## 2022-10-21 MED ORDER — TEMAZEPAM 30 MG PO CAPS: 30.0000 mg | ORAL_CAPSULE | Freq: Every evening | ORAL | 1 refills | Status: DC | PRN

## 2022-10-21 MED ORDER — CEFUROXIME AXETIL 500 MG PO TABS: 500.0000 mg | ORAL_TABLET | Freq: Two times a day (BID) | ORAL | 0 refills | Status: AC

## 2022-10-21 NOTE — Progress Notes (Signed)
Subjective:    Patient ID: Nancy [name], female    DOB: [birth year], 67 y.o.   MRN: [id number]  HPI Here for 10 days of headache, a right sided ST, right ear pain, swollen right sided neck nodes, and a dry cough. She had been around her grandchildren who all had fevers and a cough. She had an E visit with someone from her insurance company on 10-18-22, and they prescribed Benzonatate and Azelastine spray.    Review of Systems  Constitutional: Negative.   HENT:  Positive for ear pain and sore throat. Negative for congestion, postnasal drip and sinus pressure.   Eyes: Negative.   Respiratory:  Positive for cough. Negative for shortness of breath and wheezing.        Objective:   Physical Exam Constitutional:      Appearance: Normal appearance.  HENT:     Right Ear: Tympanic membrane, ear canal and external ear normal.     Left Ear: Tympanic membrane, ear canal and external ear normal.     Mouth/Throat:     Pharynx: No oropharyngeal exudate.     Comments: There is some redness around the right palatine area  Eyes:     Conjunctiva/sclera: Conjunctivae normal.  Pulmonary:     Effort: Pulmonary effort is normal.     Breath sounds: Normal breath sounds.  Lymphadenopathy:     Cervical: Cervical adenopathy present.  Neurological:     Mental Status: She is alert.           Assessment & Plan:  Tonsillitis, treat with 10 days of Cefuroxime.  Gershon Crane, MD

## 2022-11-08 DIAGNOSIS — H35371 Puckering of macula, right eye: Secondary | ICD-10-CM | POA: Diagnosis not present

## 2022-11-08 DIAGNOSIS — H18413 Arcus senilis, bilateral: Secondary | ICD-10-CM | POA: Diagnosis not present

## 2022-11-08 DIAGNOSIS — H2511 Age-related nuclear cataract, right eye: Secondary | ICD-10-CM | POA: Diagnosis not present

## 2022-11-08 DIAGNOSIS — H2513 Age-related nuclear cataract, bilateral: Secondary | ICD-10-CM | POA: Diagnosis not present

## 2022-11-08 DIAGNOSIS — H25043 Posterior subcapsular polar age-related cataract, bilateral: Secondary | ICD-10-CM | POA: Diagnosis not present

## 2022-11-11 ENCOUNTER — Other Ambulatory Visit (HOSPITAL_COMMUNITY): Payer: Self-pay

## 2022-11-13 ENCOUNTER — Other Ambulatory Visit (HOSPITAL_COMMUNITY): Payer: Self-pay

## 2022-11-26 DIAGNOSIS — M65341 Trigger finger, right ring finger: Secondary | ICD-10-CM | POA: Diagnosis not present

## 2022-12-28 ENCOUNTER — Other Ambulatory Visit: Payer: Self-pay | Admitting: Family Medicine

## 2023-01-07 DIAGNOSIS — M65341 Trigger finger, right ring finger: Secondary | ICD-10-CM | POA: Diagnosis not present

## 2023-01-10 DIAGNOSIS — I5032 Chronic diastolic (congestive) heart failure: Secondary | ICD-10-CM | POA: Diagnosis not present

## 2023-01-10 DIAGNOSIS — J9611 Chronic respiratory failure with hypoxia: Secondary | ICD-10-CM | POA: Diagnosis not present

## 2023-01-10 DIAGNOSIS — Z9981 Dependence on supplemental oxygen: Secondary | ICD-10-CM | POA: Diagnosis not present

## 2023-01-10 DIAGNOSIS — E785 Hyperlipidemia, unspecified: Secondary | ICD-10-CM | POA: Diagnosis not present

## 2023-01-10 DIAGNOSIS — I130 Hypertensive heart and chronic kidney disease with heart failure and stage 1 through stage 4 chronic kidney disease, or unspecified chronic kidney disease: Secondary | ICD-10-CM | POA: Diagnosis not present

## 2023-01-10 DIAGNOSIS — N1832 Chronic kidney disease, stage 3b: Secondary | ICD-10-CM | POA: Diagnosis not present

## 2023-01-13 DIAGNOSIS — H2511 Age-related nuclear cataract, right eye: Secondary | ICD-10-CM | POA: Diagnosis not present

## 2023-01-14 DIAGNOSIS — H25042 Posterior subcapsular polar age-related cataract, left eye: Secondary | ICD-10-CM | POA: Diagnosis not present

## 2023-01-14 DIAGNOSIS — H2512 Age-related nuclear cataract, left eye: Secondary | ICD-10-CM | POA: Diagnosis not present

## 2023-01-14 DIAGNOSIS — H25012 Cortical age-related cataract, left eye: Secondary | ICD-10-CM | POA: Diagnosis not present

## 2023-01-23 ENCOUNTER — Telehealth: Payer: Medicare HMO | Admitting: Family Medicine

## 2023-01-23 DIAGNOSIS — J069 Acute upper respiratory infection, unspecified: Secondary | ICD-10-CM

## 2023-01-23 MED ORDER — FLUTICASONE PROPIONATE 50 MCG/ACT NA SUSP
2.0000 | Freq: Every day | NASAL | 0 refills | Status: AC
Start: 2023-01-23 — End: ?

## 2023-01-23 MED ORDER — BENZONATATE 100 MG PO CAPS: 100.0000 mg | ORAL_CAPSULE | Freq: Three times a day (TID) | ORAL | 0 refills | Status: DC | PRN

## 2023-01-23 NOTE — Progress Notes (Signed)

## 2023-01-24 ENCOUNTER — Telehealth: Payer: Self-pay | Admitting: Family Medicine

## 2023-01-24 NOTE — Telephone Encounter (Signed)
Handicap placard form to be filled out--placed in dr's folder.  Call patient upon completion for pick-up.

## 2023-01-29 IMAGING — MG MM DIGITAL SCREENING BILAT W/ TOMO AND CAD
6 of 12 series · 6 of 36 positions shown · non-contrast
Comparison: Previous exam(s).

ACR Breast Density Category a: The breast tissue is almost entirely
fatty.

CLINICAL DATA: Screening.

EXAM:
DIGITAL SCREENING BILATERAL MAMMOGRAM WITH TOMOSYNTHESIS AND CAD
TECHNIQUE: Bilateral screening digital craniocaudal and mediolateral oblique
mammograms were obtained. Bilateral screening digital breast
tomosynthesis was performed. The images were evaluated with
computer-aided detection.

[R MLO synth-2D]
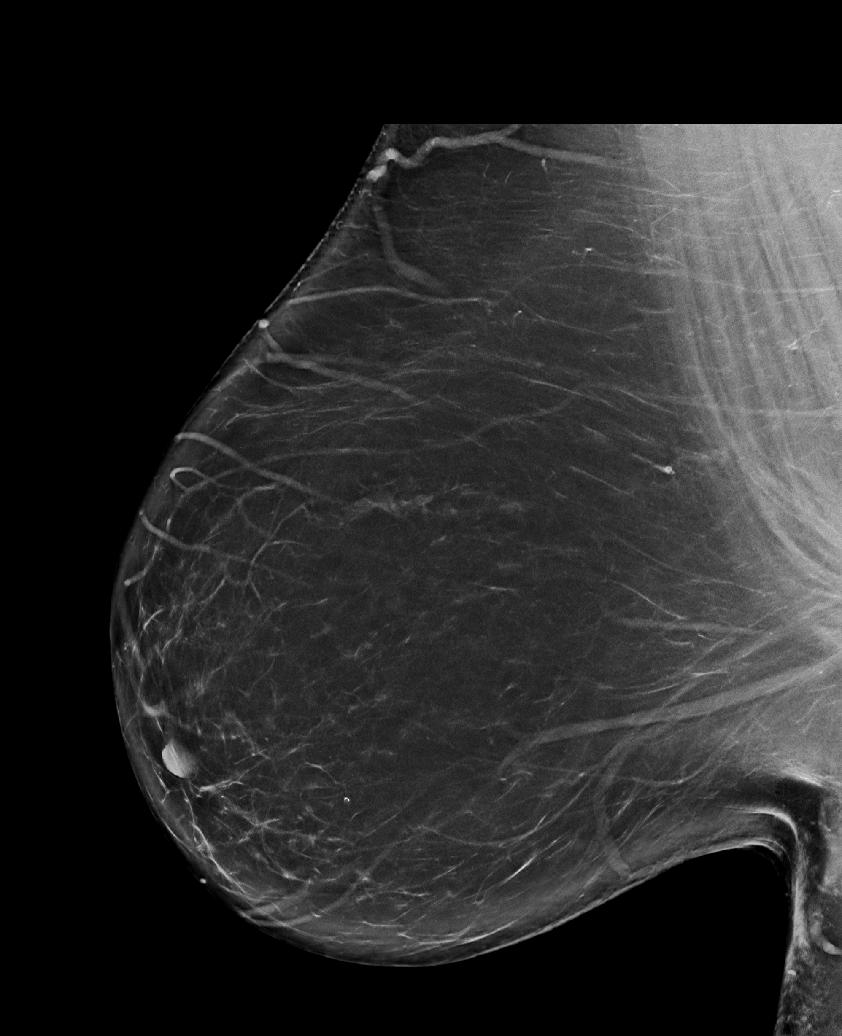

[R CC synth-2D (1 of 2)]
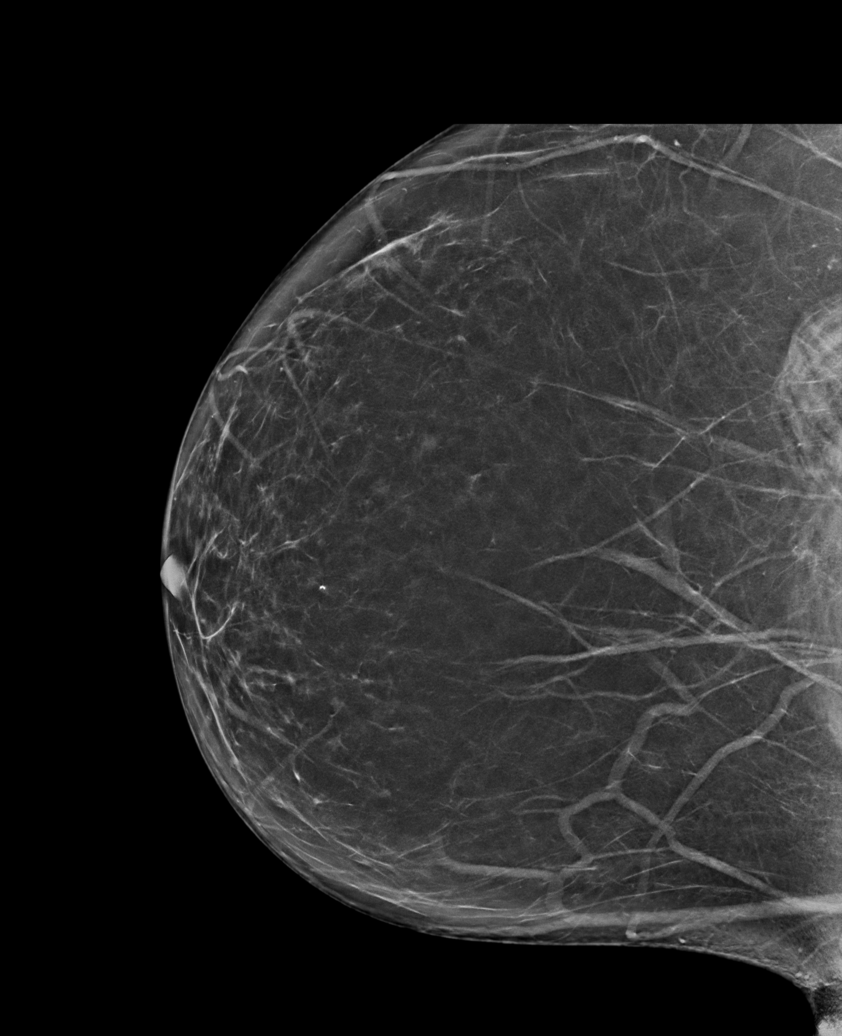

[L CC synth-2D (1 of 2)]
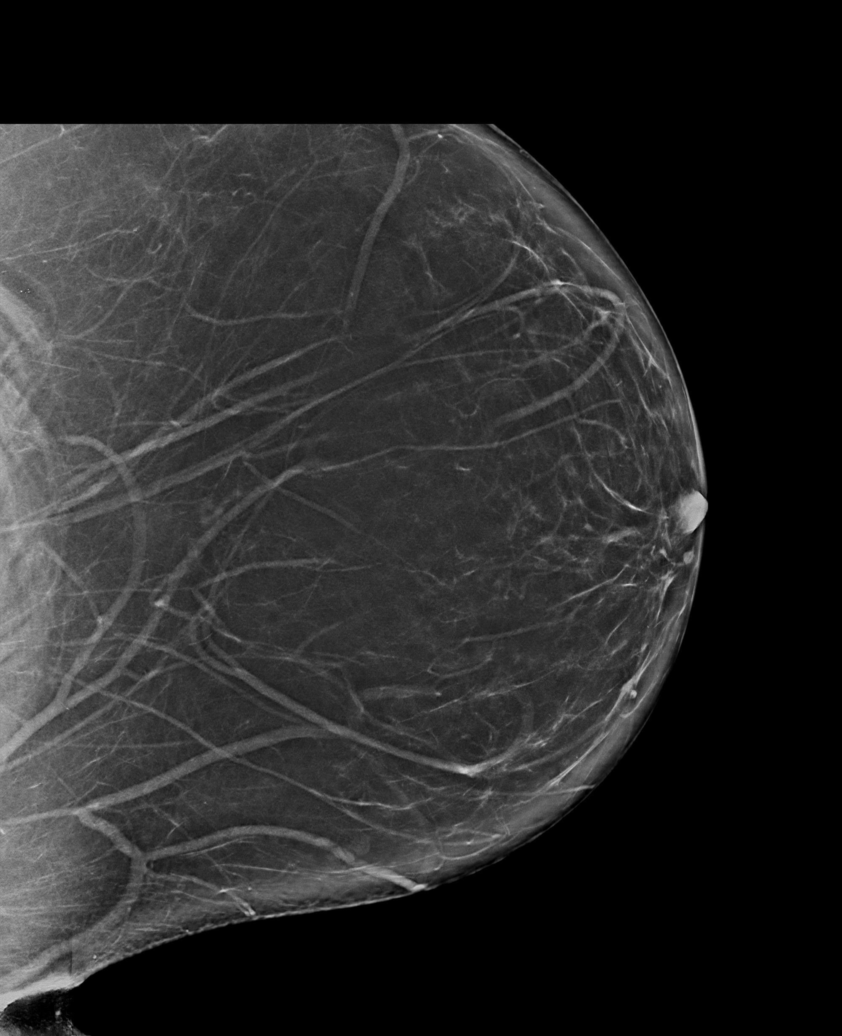

[L CC synth-2D (2 of 2)]
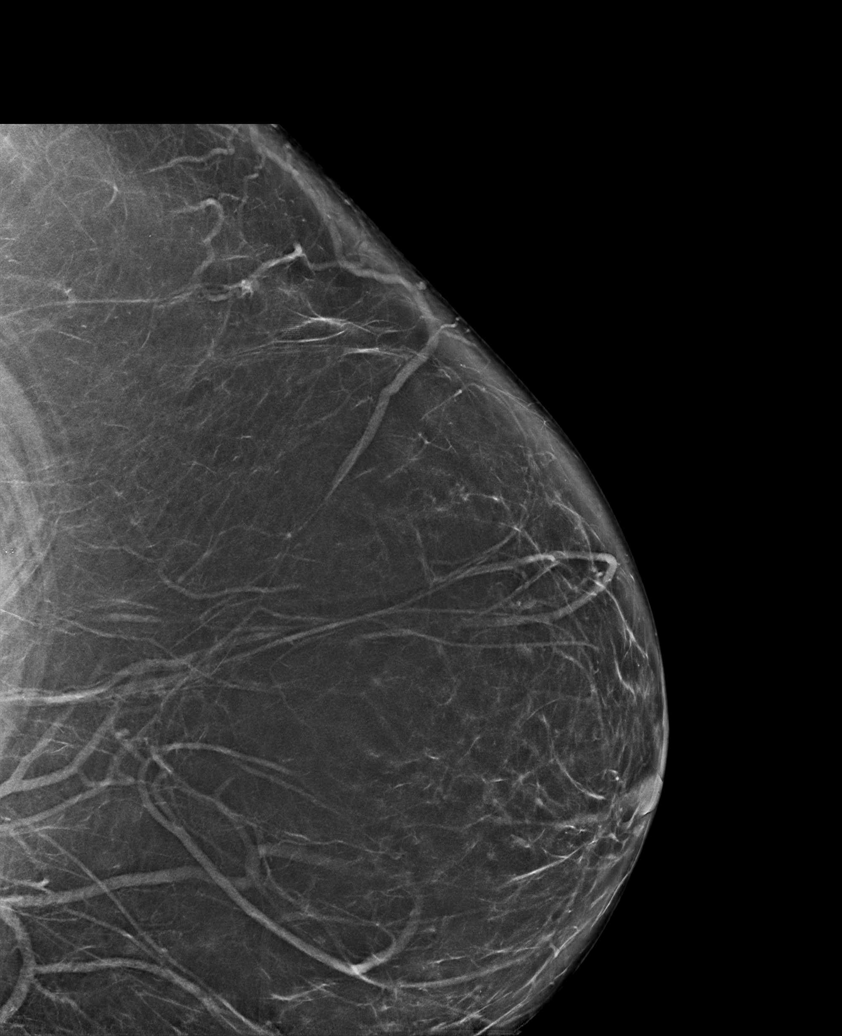

[R CC synth-2D (2 of 2)]
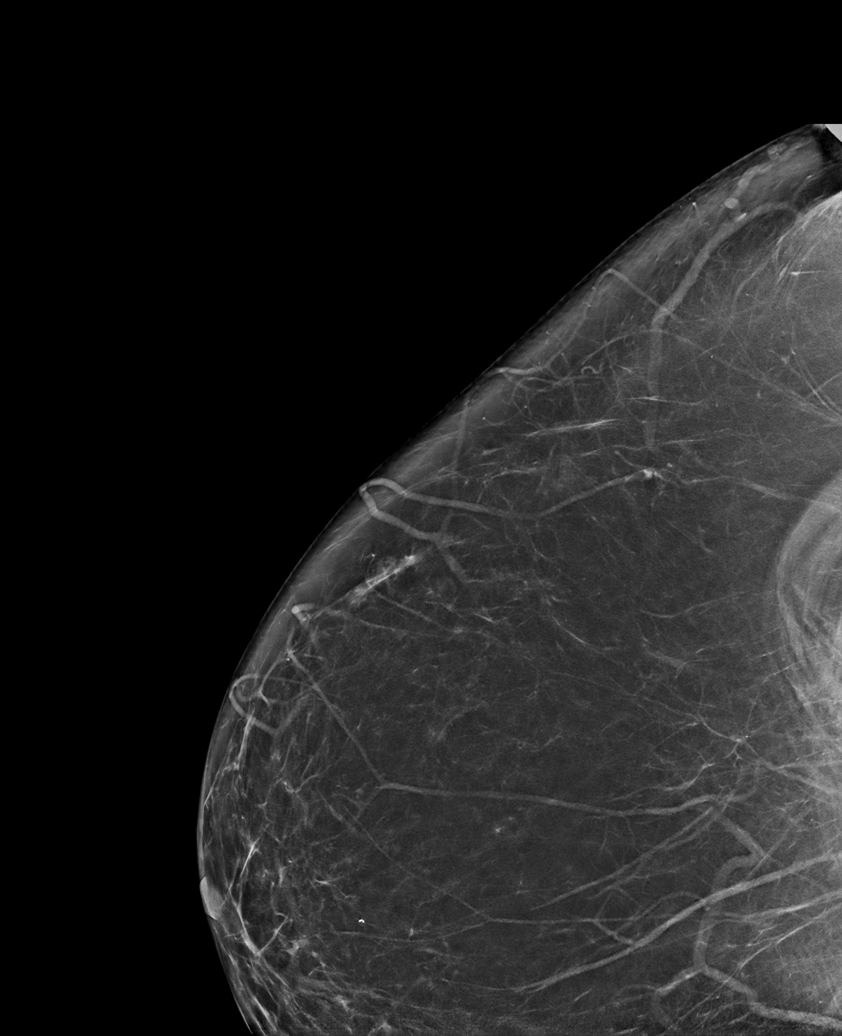

[L MLO synth-2D]
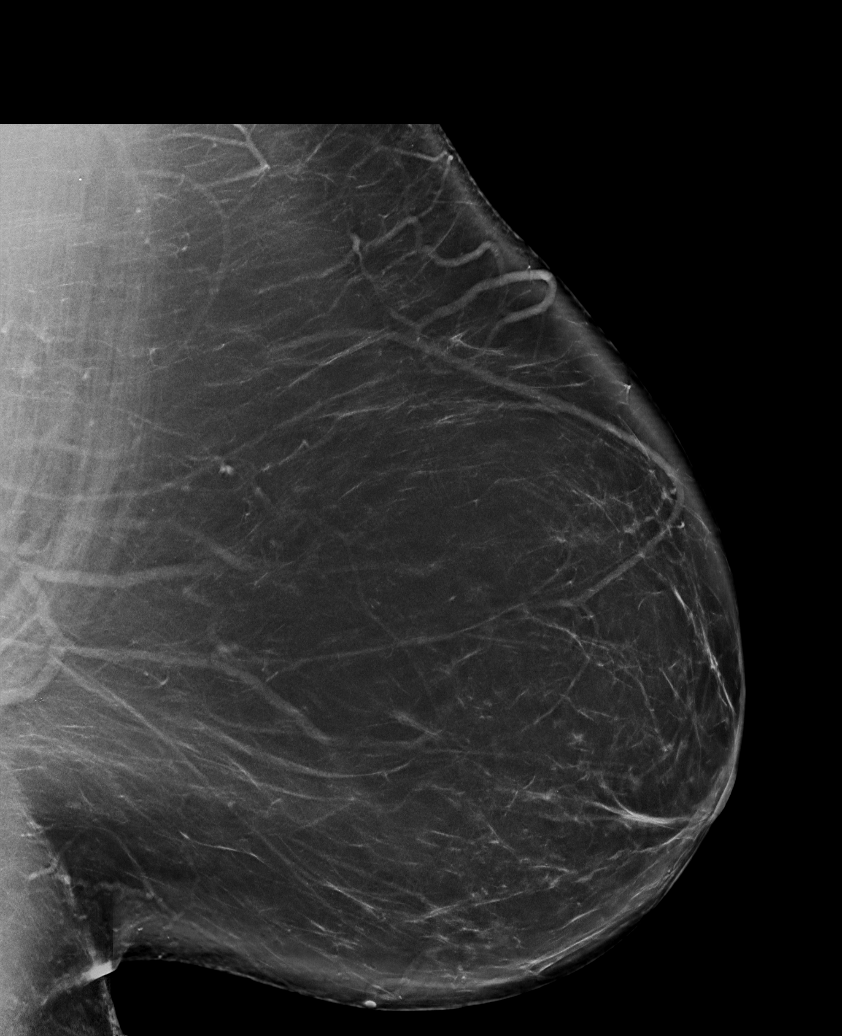

[6 of 36 positions shown; findings below may reference images not displayed]

FINDINGS: There are no findings suspicious for malignancy. The images were
evaluated with computer-aided detection.
IMPRESSION: No mammographic evidence of malignancy. A result letter of this
screening mammogram will be mailed directly to the patient.

RECOMMENDATION:
Screening mammogram in one year. (Code:JP-J-DD5)

BI-RADS CATEGORY  1: Negative.

## 2023-01-30 ENCOUNTER — Other Ambulatory Visit: Payer: Self-pay | Admitting: Family Medicine

## 2023-01-31 DIAGNOSIS — H2512 Age-related nuclear cataract, left eye: Secondary | ICD-10-CM | POA: Diagnosis not present

## 2023-02-04 NOTE — Telephone Encounter (Signed)
 Handicap placard completed and pt picked up fro the office

## 2023-02-12 ENCOUNTER — Encounter: Payer: Self-pay | Admitting: Family Medicine

## 2023-02-14 NOTE — Telephone Encounter (Signed)
Pt LOV was 10/21/22 Please advise

## 2023-02-14 NOTE — Telephone Encounter (Signed)
I can refill the Temazepam, but did she mean send it to [facility] pharmacy? (She has always used [name] in the past). As for the pain medication, she needs another PMV

## 2023-02-18 ENCOUNTER — Other Ambulatory Visit (HOSPITAL_COMMUNITY): Payer: Self-pay

## 2023-02-18 MED ORDER — TEMAZEPAM 30 MG PO CAPS
30.0000 mg | ORAL_CAPSULE | Freq: Every evening | ORAL | 1 refills | Status: DC | PRN
  Filled 2023-02-18: qty 30, 30d supply, fill #0
  Filled 2023-02-18: qty 90, 90d supply, fill #0
  Filled 2023-03-01: qty 30, 30d supply, fill #0
  Filled 2023-03-04: qty 90, 90d supply, fill #0
  Filled 2023-03-04: qty 30, 30d supply, fill #0
  Filled 2023-03-04: qty 90, 90d supply, fill #0
  Filled 2023-03-04: qty 30, 30d supply, fill #0
  Filled 2023-06-02: qty 30, 30d supply, fill #1
  Filled 2023-06-02 – 2023-06-03 (×2): qty 90, 90d supply, fill #1

## 2023-02-18 NOTE — Telephone Encounter (Signed)
I refilled the Temazepam. No need for another urine drug test, just the OV

## 2023-02-24 DIAGNOSIS — M65341 Trigger finger, right ring finger: Secondary | ICD-10-CM | POA: Diagnosis not present

## 2023-02-24 DIAGNOSIS — M6530 Trigger finger, unspecified finger: Secondary | ICD-10-CM | POA: Diagnosis not present

## 2023-03-01 ENCOUNTER — Other Ambulatory Visit (HOSPITAL_COMMUNITY): Payer: Self-pay

## 2023-03-04 ENCOUNTER — Other Ambulatory Visit (HOSPITAL_COMMUNITY): Payer: Self-pay

## 2023-03-11 DIAGNOSIS — M65341 Trigger finger, right ring finger: Secondary | ICD-10-CM | POA: Diagnosis not present

## 2023-03-12 ENCOUNTER — Other Ambulatory Visit: Payer: Self-pay | Admitting: Family Medicine

## 2023-03-12 DIAGNOSIS — I10 Essential (primary) hypertension: Secondary | ICD-10-CM

## 2023-04-13 ENCOUNTER — Other Ambulatory Visit: Payer: Self-pay | Admitting: Adult Health

## 2023-04-14 ENCOUNTER — Ambulatory Visit (INDEPENDENT_AMBULATORY_CARE_PROVIDER_SITE_OTHER)

## 2023-04-14 VITALS — Ht 67.5 in | Wt 280.0 lb

## 2023-04-14 DIAGNOSIS — Z0000 Encounter for general adult medical examination without abnormal findings: Secondary | ICD-10-CM

## 2023-04-14 NOTE — Progress Notes (Signed)
 Subjective:   Nancy [name] is a 68 y.o. who presents for a Medicare Wellness preventive visit.  Visit Complete: Virtual I connected with  Kerrin Champagne on 04/14/23 by a audio enabled telemedicine application and verified that I am speaking with the correct person using two identifiers.  Patient Location: Home  Provider Location: Home Office  I discussed the limitations of evaluation and management by telemedicine. The patient expressed understanding and agreed to proceed.  Vital Signs: Because this visit was a virtual/telehealth visit, some criteria may be missing or patient reported. Any vitals not documented were not able to be obtained and vitals that have been documented are patient reported.  VideoError- Librarian, academic were attempted between this provider and patient, however failed, due to patient having technical difficulties OR patient did not have access to video capability.  We continued and completed visit with audio only.   Persons Participating in Visit: Patient.  AWV Questionnaire: No: Patient Medicare AWV questionnaire was not completed prior to this visit.  Cardiac Risk Factors include: advanced age (>70men, >66 women);hypertension     Objective:    Today's Vitals   04/14/23 0922  Weight: 280 lb (127 kg)  Height: 5' 7.5" (1.715 m)   Body mass index is 43.21 kg/m.     04/14/2023    9:28 AM 12/03/2021    6:15 AM 06/04/2021    8:21 AM 11/26/2018   11:40 PM 11/26/2018    2:36 PM 09/17/2017    9:31 AM 07/23/2017    6:47 PM  Advanced Directives  Does Patient Have a Medical Advance Directive? Yes Yes No  No Yes No  Type of Estate agent of [place];Living will Healthcare Power of [place];Living will    Living will   Does patient want to make changes to medical advance directive?      No - Patient declined   Copy of Healthcare Power of Attorney in Chart? No - copy requested        Would patient like information  on creating a medical advance directive?   No - Patient declined No - Patient declined   No - Patient declined    Current Medications (verified) Outpatient Encounter Medications as of 04/14/2023  Medication Sig   albuterol (PROVENTIL) (2.5 MG/3ML) 0.083% nebulizer solution INHALE 3 MLS (ONE VIAL) BY NEBULIZATION EVERY 4 HOURS AS NEEDED FOR WHEEZING OR SHORTNESS OF BREATH.   albuterol (VENTOLIN HFA) 108 (90 Base) MCG/ACT inhaler Inhale 2 puffs into the lungs every 4 (four) hours as needed for wheezing or shortness of breath.   allopurinol (ZYLOPRIM) 300 MG tablet Take 1 tablet (300 mg total) by mouth 2 (two) times daily.   aspirin 81 MG tablet Take 81 mg by mouth at bedtime.    atorvastatin (LIPITOR) 80 MG tablet TAKE 1 TABLET EVERY DAY   benzonatate (TESSALON) 100 MG capsule Take 1 capsule (100 mg total) by mouth 3 (three) times daily as needed for cough.   calcitRIOL (ROCALTROL) 0.25 MCG capsule TAKE 1 CAPSULE TWICE DAILY   calcium citrate (CALCITRATE) 950 (200 Ca) MG tablet Take 1 tablet (200 mg of elemental calcium total) by mouth 3 (three) times daily. (Patient taking differently: Take 200-400 mg of elemental calcium by mouth See admin instructions. 200 mg in the morning, 400 mg in the evening)   cetirizine (ZYRTEC) 10 MG tablet Take 10 mg by mouth daily.   colchicine 0.6 MG tablet TAKE 1 TABLET BY MOUTH EVERY 6 (SIX) HOURS AS  NEEDED FOR GOUT   escitalopram (LEXAPRO) 20 MG tablet TAKE 1 TABLET EVERY DAY   fluticasone (FLONASE) 50 MCG/ACT nasal spray Place 2 sprays into both nostrils daily.   furosemide (LASIX) 80 MG tablet TAKE 1 TABLET EVERY DAY   gabapentin (NEURONTIN) 300 MG capsule Take 1 capsule (300 mg total) by mouth 2 (two) times daily.   JARDIANCE 10 MG TABS tablet TAKE 1 TABLET EVERY DAY BEFORE BREAKFAST   levothyroxine (SYNTHROID) 200 MCG tablet TAKE 1 TABLET EVERY DAY   magnesium oxide (MAG-OX) 400 (240 Mg) MG tablet Take 400 mg by mouth 2 (two) times daily.   metoprolol  tartrate (LOPRESSOR) 25 MG tablet TAKE 1 TABLET TWICE DAILY   montelukast (SINGULAIR) 10 MG tablet TAKE 1 TABLET AT BEDTIME   Multiple Vitamin (MULTIVITAMIN) tablet Take 1 tablet by mouth at bedtime.   omeprazole (PRILOSEC) 40 MG capsule TAKE 1 CAPSULE TWICE DAILY   OXYGEN Inhale 3-4 L into the lungs continuous.   Semaglutide, 1 MG/DOSE, (OZEMPIC, 1 MG/DOSE,) 4 MG/3ML SOPN INJECT 1MG  UNDER THE SKIN ONE TIME WEEKLY   spironolactone (ALDACTONE) 25 MG tablet TAKE 1 TABLET EVERY DAY   temazepam (RESTORIL) 30 MG capsule Take 1 capsule (30 mg total) by mouth at bedtime as needed for sleep.   No facility-administered encounter medications on file as of 04/14/2023.    Allergies (verified) Sulfa antibiotics   History: Past Medical History:  Diagnosis Date   Allergic rhinitis    gets shots per Dr. Irena Cords    Anemia 2001   Anginal pain (HCC)    Asthma    COPD (chronic obstructive pulmonary disease) (HCC)    "CXR just showed mild COPD" (10/24/2011)   Coronary artery disease    had MI in 2001, see[facility] Kirke Cor[facility]   GERD (gastroesophageal reflux disease)    Gynecological examination    sees Dr. Eda Paschal    History of cardiac catheterization 07-24-07   showed nonconclusive disease   Hyperlipidemia    Hypertension    Hypothyroidism    Migraines    "have a history of migraines; haven't had one for years" (10/24/2011)   Myocardial infarction [facility]) 2001?   in her 89's   Past Surgical History:  Procedure Laterality Date   CARDIAC CATHETERIZATION  2009   CESAREAN SECTION  1984   COLONOSCOPY  03/01/2020   per Dr. Marina Goodell, adenomatous  polyps, repeat in 3 yrs   ESOPHAGOGASTRODUODENOSCOPY  05/29/2017   per Dr. Marina Goodell, normal except slight gastritis    HERNIA REPAIR  ~ 2007   ventral hernia repair   RIGHT/LEFT HEART CATH AND CORONARY ANGIOGRAPHY N/A 12/03/2021   Procedure: RIGHT/LEFT HEART CATH AND CORONARY ANGIOGRAPHY;  Surgeon: Dolores Patty, MD;  [facility];  Service: Cardiovascular;  Laterality: N/A;   THYROIDECTOMY  1990's   TONSILLECTOMY     "when I was a kid"   Family History  Problem Relation Age of Onset   Pulmonary fibrosis Mother    COPD Mother    Breast cancer Mother        76's   Coronary artery disease Father    Hypertension Father    Cancer Other        breast,colon,prostate   Alcohol abuse Other    Hyperlipidemia Other    Hypertension Other    Stroke Other    Heart disease Other    COPD Other    Diabetes Other    Colon cancer Paternal Grandfather  Stomach cancer Other    Esophageal cancer Neg Hx    Rectal cancer Neg Hx    Social History   Socioeconomic History   Marital status: Divorced    Spouse name: Not on file   Number of children: 1   Years of education: Not on file   Highest education level: Associate degree: occupational, Scientist, product/process development, or vocational program  Occupational History   Occupation: respiratory therapist  Tobacco Use   Smoking status: Former    Current packs/day: 0.00    Average packs/day: 1 pack/day for 30.0 years (30.0 ttl pk-yrs)    Types: Cigarettes    Start date: 10/23/1976    Quit date: 10/24/2006    Years since quitting: 16.4   Smokeless tobacco: Never  Vaping Use   Vaping status: Never Used  Substance and Sexual Activity   Alcohol use: No    Alcohol/week: 0.0 standard drinks of alcohol   Drug use: No   Sexual activity: Never  Other Topics Concern   Not on file  Social History Narrative   Not on file   Social Drivers of Health   Financial Resource Strain: Low Risk  (04/14/2023)   Overall Financial Resource Strain (CARDIA)    Difficulty of Paying Living Expenses: Not hard at all  Food Insecurity: No Food Insecurity (04/14/2023)   Hunger Vital Sign    Worried About Running Out of Food in the Last Year: Never true    Ran Out of Food in the Last Year: Never true  Transportation Needs: No Transportation Needs (04/14/2023)   PRAPARE - Scientist, research (physical sciences) (Medical): No    Lack of Transportation (Non-Medical): No  Physical Activity: Inactive (04/14/2023)   Exercise Vital Sign    Days of Exercise per Week: 0 days    Minutes of Exercise per Session: 0 min  Stress: No Stress Concern Present (04/14/2023)   [employer] of Occupational Health - Occupational Stress Questionnaire    Feeling of Stress : Not at all  Social Connections: Socially Isolated (04/14/2023)   Social Connection and Isolation Panel [NHANES]    Frequency of Communication with Friends and Family: More than three times a week    Frequency of Social Gatherings with Friends and Family: More than three times a week    Attends Religious Services: Never    Database administrator or Organizations: No    Attends Engineer, structural: Never    Marital Status: Divorced    Tobacco Counseling Counseling given: Not Answered    Clinical Intake:  Pre-visit preparation completed: Yes  Pain : No/denies pain     BMI - recorded: 43.21 Nutritional Status: BMI > 30  Obese Nutritional Risks: None Diabetes: No  How often do you need to have someone help you when you read instructions, pamphlets, or other written materials from your doctor or pharmacy?: 1 - Never  Interpreter Needed?: No  Information entered by :: Theresa Mulligan LPN   Activities of Daily Living      04/14/2023    9:27 AM  In your present state of health, do you have any difficulty performing the following activities:  Hearing? 0  Vision? 0  Difficulty concentrating or making decisions? 0  Walking or climbing stairs? 0  Dressing or bathing? 0  Doing errands, shopping? 0  Preparing Food and eating ? N  Using the Toilet? N  In the past six months, have you accidently leaked urine? N  Do you have problems  with loss of bowel control? N  Managing your Medications? N  Managing your Finances? N  Housekeeping or managing your Housekeeping? N    Patient Care Team: Nelwyn Salisbury, MD  as PCP - General  Indicate any recent Medical Services you may have received from other than [facility] providers in the past year (date may be approximate).     Assessment:   This is a routine wellness examination for [employer].  Hearing/Vision screen Hearing Screening - Comments:: Denies hearing difficulties   Vision Screening - Comments:: Wears rx glasses - up to date with routine eye exams with  [facility]   Goals Addressed               This Visit's Progress     Increase physical activity (pt-stated)        Lose weight       Depression Screen     04/14/2023    9:26 AM 10/21/2022    9:08 AM 05/17/2022    9:04 AM 04/22/2022    1:56 PM 12/18/2021    3:51 PM 06/18/2021    4:33 PM 04/04/2021   11:03 AM  PHQ 2/9 Scores  PHQ - 2 Score 0 0 0 0 0 0 6  PHQ- 9 Score  2 0 0 0 0 12    Fall Risk     04/14/2023    9:27 AM 10/21/2022    9:07 AM 05/17/2022    9:04 AM 04/22/2022    1:56 PM 12/18/2021    3:51 PM  Fall Risk   Falls in the past year? 0 0 0 0 0  Number falls in past yr: 0 0 0 0 0  Injury with Fall? 0 0 0 0 0  Risk for fall due to : No Fall Risks No Fall Risks No Fall Risks No Fall Risks No Fall Risks  Follow up Falls prevention discussed;Falls evaluation completed Falls evaluation completed Falls evaluation completed Falls evaluation completed Falls evaluation completed    MEDICARE RISK AT HOME:  Medicare Risk at Home Any stairs in or around the home?: Yes If so, are there any without handrails?: No Home free of loose throw rugs in walkways, pet beds, electrical cords, etc?: Yes Adequate lighting in your home to reduce risk of falls?: Yes Life alert?: No Use of a cane, walker or w/c?: No Grab bars in the bathroom?: No Shower chair or bench in shower?: Yes Elevated toilet seat or a handicapped toilet?: No  TIMED UP AND GO:  Was the test performed?  No  Cognitive Function: 6CIT completed        04/14/2023    9:29 AM  6CIT Screen  What Year? 0 points  What  month? 0 points  What time? 0 points  Count back from 20 0 points  Months in reverse 0 points  Repeat phrase 0 points  Total Score 0 points    Immunizations Immunization History  Administered Date(s) Administered   Influenza, Seasonal, Injecte, Preservative Fre 10/11/2016   Influenza,inj,Quad PF,6+ Mos 10/07/2017   Influenza,inj,Quad PF,6-35 Mos 11/15/2019   Influenza-Unspecified 10/28/2013, 11/22/2014, 10/11/2016, 10/29/2022   PFIZER(Purple Top)SARS-COV-2 Vaccination 09/29/2019, 10/20/2019   Tdap 01/14/2012    Screening Tests Health Maintenance  Topic Date Due   Pneumonia Vaccine 16+ Years old (1 of 2 - PCV) Never done   Hepatitis C Screening  Never done   Cervical Cancer Screening (HPV/Pap Cotest)  05/29/2013   COVID-19 Vaccine (3 - 2024-25 season) 09/29/2022   DEXA  SCAN  Never done   Colonoscopy  03/02/2023   Zoster Vaccines- Shingrix (1 of 2) 05/28/2023 (Originally 01/15/2008)   Medicare Annual Wellness (AWV)  04/13/2024   MAMMOGRAM  09/19/2024   INFLUENZA VACCINE  Completed   HIV Screening  Completed   HPV VACCINES  Aged Out   DTaP/Tdap/Td  Discontinued    Health Maintenance  Health Maintenance Due  Topic Date Due   Pneumonia Vaccine 79+ Years old (1 of 2 - PCV) Never done   Hepatitis C Screening  Never done   Cervical Cancer Screening (HPV/Pap Cotest)  05/29/2013   COVID-19 Vaccine (3 - 2024-25 season) 09/29/2022   DEXA SCAN  Never done   Colonoscopy  03/02/2023   Health Maintenance Items Addressed:    Additional Screening:  Vision Screening: Recommended annual ophthalmology exams for early detection of glaucoma and other disorders of the eye.  Dental Screening: Recommended annual dental exams for proper oral hygiene  Community Resource Referral / Chronic Care Management: CRR required this visit?  No   CCM required this visit?  No     Plan:     I have personally reviewed and noted the following in the patient's chart:   Medical and social  history Use of alcohol, tobacco or illicit drugs  Current medications and supplements including opioid prescriptions. Patient is not currently taking opioid prescriptions. Functional ability and status Nutritional status Physical activity Advanced directives List of other physicians Hospitalizations, surgeries, and ER visits in previous 12 months Vitals Screenings to include cognitive, depression, and falls Referrals and appointments  In addition, I have reviewed and discussed with patient certain preventive protocols, quality metrics, and best practice recommendations. A written personalized care plan for preventive services as well as general preventive health recommendations were provided to patient.     Tillie Rung, LPN   [id number]/[id number]   After Visit Summary: (MyChart) Due to this being a telephonic visit, the after visit summary with patients personalized plan was offered to patient via MyChart   Notes: Nothing significant to report at this time.

## 2023-04-14 NOTE — Patient Instructions (Addendum)
 Nancy [name] , Thank you for taking time to come for your Medicare Wellness Visit. I appreciate your ongoing commitment to your health goals. Please review the following plan we discussed and let me know if I can assist you in the future.   Referrals/Orders/Follow-Ups/Clinician Recommendations:   This is a list of the screening recommended for you and due dates:  Health Maintenance  Topic Date Due   Pneumonia Vaccine (1 of 2 - PCV) Never done   Hepatitis C Screening  Never done   Pap with HPV screening  05/29/2013   COVID-19 Vaccine (3 - 2024-25 season) 09/29/2022   DEXA scan (bone density measurement)  Never done   Colon Cancer Screening  03/02/2023   Zoster (Shingles) Vaccine (1 of 2) 05/28/2023*   Medicare Annual Wellness Visit  04/13/2024   Mammogram  09/19/2024   Flu Shot  Completed   HIV Screening  Completed   HPV Vaccine  Aged Out   DTaP/Tdap/Td vaccine  Discontinued  *Topic was postponed. The date shown is not the original due date.    Advanced directives: (Copy Requested) Please bring a copy of your health care power of attorney and living will to the office to be added to your chart at your convenience. You can mail to [facility] [address] [place], [place] [address] or email to [email or web address]  Next Medicare Annual Wellness Visit scheduled for next year: Yes

## 2023-04-23 ENCOUNTER — Other Ambulatory Visit: Payer: Self-pay | Admitting: Family Medicine

## 2023-04-23 ENCOUNTER — Encounter: Payer: Self-pay | Admitting: Family Medicine

## 2023-04-23 DIAGNOSIS — M545 Low back pain, unspecified: Secondary | ICD-10-CM

## 2023-04-23 MED ORDER — GABAPENTIN 300 MG PO CAPS: 300.0000 mg | ORAL_CAPSULE | Freq: Two times a day (BID) | ORAL | 0 refills | Status: DC

## 2023-04-29 ENCOUNTER — Encounter: Payer: Self-pay | Admitting: Internal Medicine

## 2023-06-02 ENCOUNTER — Other Ambulatory Visit (HOSPITAL_COMMUNITY): Payer: Self-pay

## 2023-06-02 ENCOUNTER — Other Ambulatory Visit: Payer: Self-pay

## 2023-06-03 ENCOUNTER — Other Ambulatory Visit: Payer: Self-pay

## 2023-06-03 ENCOUNTER — Other Ambulatory Visit (HOSPITAL_COMMUNITY): Payer: Self-pay

## 2023-06-16 ENCOUNTER — Other Ambulatory Visit (HOSPITAL_COMMUNITY): Payer: Self-pay

## 2023-06-24 ENCOUNTER — Encounter: Payer: Self-pay | Admitting: Nurse Practitioner

## 2023-06-27 ENCOUNTER — Other Ambulatory Visit: Payer: Self-pay | Admitting: Family Medicine

## 2023-06-27 DIAGNOSIS — E039 Hypothyroidism, unspecified: Secondary | ICD-10-CM

## 2023-08-06 DIAGNOSIS — I129 Hypertensive chronic kidney disease with stage 1 through stage 4 chronic kidney disease, or unspecified chronic kidney disease: Secondary | ICD-10-CM | POA: Diagnosis not present

## 2023-08-06 DIAGNOSIS — J9611 Chronic respiratory failure with hypoxia: Secondary | ICD-10-CM | POA: Diagnosis not present

## 2023-08-06 DIAGNOSIS — E7800 Pure hypercholesterolemia, unspecified: Secondary | ICD-10-CM | POA: Diagnosis not present

## 2023-08-06 DIAGNOSIS — E669 Obesity, unspecified: Secondary | ICD-10-CM | POA: Diagnosis not present

## 2023-08-06 DIAGNOSIS — I5032 Chronic diastolic (congestive) heart failure: Secondary | ICD-10-CM | POA: Diagnosis not present

## 2023-08-06 DIAGNOSIS — I130 Hypertensive heart and chronic kidney disease with heart failure and stage 1 through stage 4 chronic kidney disease, or unspecified chronic kidney disease: Secondary | ICD-10-CM | POA: Diagnosis not present

## 2023-08-06 DIAGNOSIS — N1832 Chronic kidney disease, stage 3b: Secondary | ICD-10-CM | POA: Diagnosis not present

## 2023-08-06 DIAGNOSIS — Z9981 Dependence on supplemental oxygen: Secondary | ICD-10-CM | POA: Diagnosis not present

## 2023-08-11 LAB — LAB REPORT - SCANNED
Albumin, Urine POC: <3
Creatinine, POC: 34.5 mg/dL
EGFR: 39
Microalb Creat Ratio: <9

## 2023-08-13 ENCOUNTER — Telehealth (INDEPENDENT_AMBULATORY_CARE_PROVIDER_SITE_OTHER): Admitting: Family Medicine

## 2023-08-13 ENCOUNTER — Other Ambulatory Visit: Payer: Self-pay

## 2023-08-13 ENCOUNTER — Encounter: Payer: Self-pay | Admitting: Family Medicine

## 2023-08-13 ENCOUNTER — Other Ambulatory Visit (HOSPITAL_COMMUNITY): Payer: Self-pay

## 2023-08-13 DIAGNOSIS — M79604 Pain in right leg: Secondary | ICD-10-CM | POA: Diagnosis not present

## 2023-08-13 DIAGNOSIS — M545 Low back pain, unspecified: Secondary | ICD-10-CM | POA: Diagnosis not present

## 2023-08-13 DIAGNOSIS — G8929 Other chronic pain: Secondary | ICD-10-CM | POA: Diagnosis not present

## 2023-08-13 MED ORDER — HYDROCODONE-ACETAMINOPHEN 10-325 MG PO TABS
1.0000 | ORAL_TABLET | Freq: Two times a day (BID) | ORAL | 0 refills | Status: DC | PRN
  Filled 2023-08-13 – 2023-08-14 (×2): qty 60, 30d supply, fill #0

## 2023-08-13 NOTE — Progress Notes (Signed)
 Subjective:    Patient ID: Nancy [name], female    DOB: 14-Dec-1955, 68 y.o.   MRN: [id number]  HPI Virtual Visit via Video Note  I connected with the patient on 08/13/23 at  2:15 PM EDT by a video enabled telemedicine application and verified that I am speaking with the correct person using two identifiers.  Location patient: home Location provider:work or home office Persons participating in the virtual visit: patient, provider  I discussed the limitations of evaluation and management by telemedicine and the availability of in person appointments. The patient expressed understanding and agreed to proceed.   HPI: Here for pain management. He back pain has gotten worse since she injured it recently by going bowling.    ROS: See pertinent positives and negatives per HPI.  Past Medical History:  Diagnosis Date   Allergic rhinitis    gets shots per Dr. Fleeta Smock    Anemia 2001   Anginal pain (HCC)    Asthma    COPD (chronic obstructive pulmonary disease) (HCC)    CXR just showed mild COPD (10/24/2011)   Coronary artery disease    had MI in 2001, seees Dr. Darron at [facility]   GERD (gastroesophageal reflux disease)    Gynecological examination    sees Dr. Norval    History of cardiac catheterization 07-24-07   showed nonconclusive disease   Hyperlipidemia    Hypertension    Hypothyroidism    Migraines    have a history of migraines; haven't had one for years (10/24/2011)   Myocardial infarction [facility]) 2001?   in her 45's    Past Surgical History:  Procedure Laterality Date   CARDIAC CATHETERIZATION  2009   CESAREAN SECTION  1984   COLONOSCOPY  03/01/2020   per Dr. Abran, adenomatous  polyps, repeat in 3 yrs   ESOPHAGOGASTRODUODENOSCOPY  05/29/2017   per Dr. Abran, normal except slight gastritis    HERNIA REPAIR  ~ 2007   ventral hernia repair   RIGHT/LEFT HEART CATH AND CORONARY ANGIOGRAPHY N/A 12/03/2021   Procedure: RIGHT/LEFT HEART CATH AND  CORONARY ANGIOGRAPHY;  Surgeon: Cherrie Toribio SAUNDERS, MD;  Location: [facility] INVAS[facility] CV LAB;  Service: Cardiovascular;  Laterality: N/A;   THYROIDECTOMY  1990's   TONSILLECTOMY     when I was a kid    Family History  Problem Relation Age of Onset   Pulmonary fibrosis Mother    COPD Mother    Breast cancer Mother        41's   Coronary artery disease Father    Hypertension Father    Cancer Other        breast,colon,prostate   Alcohol abuse Other    Hyperlipidemia Other    Hypertension Other    Stroke Other    Heart disease Other    COPD Other    Diabetes Other    Colon cancer Paternal Grandfather    Stomach cancer Other    Esophageal cancer Neg Hx    Rectal cancer Neg Hx      Current Outpatient Medications:    albuterol  (PROVENTIL ) (2.5 MG/3ML) 0.083% nebulizer solution, INHALE 3 MLS (ONE VIAL) BY NEBULIZATION EVERY 4 HOURS AS NEEDED FOR WHEEZING OR SHORTNESS OF BREATH., Disp: 75 mL, Rfl: 11   albuterol  (VENTOLIN  HFA) 108 (90 Base) MCG/ACT inhaler, Inhale 2 puffs into the lungs every 4 (four) hours as needed for wheezing or shortness of breath., Disp: 18 g, Rfl: 5   allopurinol  (ZYLOPRIM ) 300 MG tablet, TAKE  1 TABLET TWICE DAILY, Disp: 180 tablet, Rfl: 3   aspirin  81 MG tablet, Take 81 mg by mouth at bedtime. , Disp: , Rfl:    atorvastatin  (LIPITOR) 80 MG tablet, TAKE 1 TABLET EVERY DAY, Disp: 90 tablet, Rfl: 3   benzonatate  (TESSALON ) 100 MG capsule, Take 1 capsule (100 mg total) by mouth 3 (three) times daily as needed for cough., Disp: 30 capsule, Rfl: 0   calcitRIOL  (ROCALTROL ) 0.25 MCG capsule, TAKE 1 CAPSULE TWICE DAILY, Disp: 180 capsule, Rfl: 3   calcium  citrate (CALCITRATE) 950 (200 Ca) MG tablet, Take 1 tablet (200 mg of elemental calcium  total) by mouth 3 (three) times daily. (Patient taking differently: Take 200-400 mg of elemental calcium  by mouth See admin instructions. 200 mg in the morning, 400 mg in the evening), Disp: 3 tablet, Rfl: 0   cetirizine (ZYRTEC) 10 MG  tablet, Take 10 mg by mouth daily., Disp: , Rfl:    colchicine  0.6 MG tablet, TAKE 1 TABLET BY MOUTH EVERY 6 (SIX) HOURS AS NEEDED FOR GOUT, Disp: 360 tablet, Rfl: 1   escitalopram  (LEXAPRO ) 20 MG tablet, TAKE 1 TABLET EVERY DAY, Disp: 90 tablet, Rfl: 3   fluticasone  (FLONASE ) 50 MCG/ACT nasal spray, Place 2 sprays into both nostrils daily., Disp: 16 g, Rfl: 0   furosemide  (LASIX ) 80 MG tablet, TAKE 1 TABLET EVERY DAY, Disp: 90 tablet, Rfl: 3   gabapentin  (NEURONTIN ) 300 MG capsule, Take 1 capsule (300 mg total) by mouth 2 (two) times daily., Disp: 180 capsule, Rfl: 0   HYDROcodone -acetaminophen  (NORCO) 10-325 MG tablet, Take 1 tablet by mouth 2 (two) times daily as needed for moderate pain (pain score 4-6)., Disp: 60 tablet, Rfl: 0   JARDIANCE  10 MG TABS tablet, TAKE 1 TABLET EVERY DAY BEFORE BREAKFAST, Disp: 90 tablet, Rfl: 3   levothyroxine  (SYNTHROID ) 200 MCG tablet, TAKE 1 TABLET EVERY DAY, Disp: 90 tablet, Rfl: 3   magnesium  oxide (MAG-OX) 400 (240 Mg) MG tablet, Take 400 mg by mouth 2 (two) times daily., Disp: , Rfl:    metoprolol  tartrate (LOPRESSOR ) 25 MG tablet, TAKE 1 TABLET TWICE DAILY, Disp: 180 tablet, Rfl: 3   montelukast  (SINGULAIR ) 10 MG tablet, TAKE 1 TABLET AT BEDTIME, Disp: 90 tablet, Rfl: 3   Multiple Vitamin (MULTIVITAMIN) tablet, Take 1 tablet by mouth at bedtime., Disp: , Rfl:    omeprazole  (PRILOSEC) 40 MG capsule, TAKE 1 CAPSULE TWICE DAILY, Disp: 180 capsule, Rfl: 3   OXYGEN , Inhale 3-4 L into the lungs continuous., Disp: , Rfl:    spironolactone  (ALDACTONE ) 25 MG tablet, TAKE 1 TABLET EVERY DAY, Disp: 90 tablet, Rfl: 3   temazepam  (RESTORIL ) 30 MG capsule, Take 1 capsule (30 mg total) by mouth at bedtime as needed for sleep., Disp: 90 capsule, Rfl: 1  EXAM:  VITALS per patient if applicable:  GENERAL: alert, oriented, appears well and in no acute distress  HEENT: atraumatic, conjunttiva clear, no obvious abnormalities on inspection of external nose and  ears  NECK: normal movements of the head and neck  LUNGS: on inspection no signs of respiratory distress, breathing rate appears normal, no obvious gross SOB, gasping or wheezing  CV: no obvious cyanosis  MS: moves all visible extremities without noticeable abnormality  PSYCH/NEURO: pleasant and cooperative, no obvious depression or anxiety, speech and thought processing grossly intact  ASSESSMENT AND PLAN: Pain management. Indication for chronic opioid: low back pain Medication and dose: Norco 10-325 # pills per month: 60 Last UDS date: 05-17-22 Opioid Treatment  Agreement signed (Y/N): 01-21-19 Opioid Treatment Agreement last reviewed with patient:  08-13-23 [facility] reviewed this encounter (include red flags): Yes We will increase the Norco dose to 10-325 and we will send in a 30 day supply only. She is past due for a UDS, so she will come by the lab tomorrow for this.  Garnette Olmsted, MD  Discussed the following assessment and plan:  No diagnosis found.     I discussed the assessment and treatment plan with the patient. The patient was provided an opportunity to ask questions and all were answered. The patient agreed with the plan and demonstrated an understanding of the instructions.   The patient was advised to call back or seek an in-person evaluation if the symptoms worsen or if the condition fails to improve as anticipated.      Review of Systems     Objective:   Physical Exam        Assessment & Plan:

## 2023-08-13 NOTE — Addendum Note (Signed)
 Addended by: JOHNNY SENIOR A on: 08/13/2023 02:49 PM   Modules accepted: Orders

## 2023-08-14 ENCOUNTER — Ambulatory Visit: Admitting: Nurse Practitioner

## 2023-08-14 ENCOUNTER — Encounter: Payer: Self-pay | Admitting: Nurse Practitioner

## 2023-08-14 ENCOUNTER — Other Ambulatory Visit (HOSPITAL_COMMUNITY): Payer: Self-pay

## 2023-08-14 ENCOUNTER — Other Ambulatory Visit: Payer: Self-pay

## 2023-08-14 ENCOUNTER — Telehealth: Payer: Self-pay | Admitting: Family Medicine

## 2023-08-14 VITALS — BP 132/80 | HR 63 | Ht 67.5 in | Wt 280.0 lb

## 2023-08-14 DIAGNOSIS — Z800 Family history of malignant neoplasm of digestive organs: Secondary | ICD-10-CM | POA: Diagnosis not present

## 2023-08-14 DIAGNOSIS — R1310 Dysphagia, unspecified: Secondary | ICD-10-CM

## 2023-08-14 DIAGNOSIS — K219 Gastro-esophageal reflux disease without esophagitis: Secondary | ICD-10-CM | POA: Diagnosis not present

## 2023-08-14 DIAGNOSIS — M25561 Pain in right knee: Secondary | ICD-10-CM | POA: Diagnosis not present

## 2023-08-14 DIAGNOSIS — Z860100 Personal history of colon polyps, unspecified: Secondary | ICD-10-CM

## 2023-08-14 MED ORDER — TEMAZEPAM 30 MG PO CAPS
30.0000 mg | ORAL_CAPSULE | Freq: Every evening | ORAL | 1 refills | Status: DC | PRN
  Filled 2023-08-14: qty 30, 30d supply, fill #0
  Filled 2023-08-14: qty 90, 90d supply, fill #0
  Filled 2023-11-26: qty 90, 90d supply, fill #1

## 2023-08-14 MED ORDER — NA SULFATE-K SULFATE-MG SULF 17.5-3.13-1.6 GM/177ML PO SOLN: 1.0000 | Freq: Once | ORAL | 0 refills | Status: AC

## 2023-08-14 NOTE — Progress Notes (Signed)
 08/14/2023 Nancy [name] [id number] May 25, 1955   CHIEF COMPLAINT: Discuss scheduling a colonoscopy  HISTORY OF PRESENT ILLNESS: Nancy [name] is a 68 year old female with a past medical history of hypertension, hyperlipidemia, coronary artery disease status post MI 2001, diastolic CHF, asthma, minimal pulmonary hypertension per cardiac cath 11/2021, CKD, hypothyroidism, anemia, GERD and colon polyps. She is known by Dr. Abran. She presents today to discuss scheduling an upper endoscopy and colonoscopy. She describes having dysphagia, gets choked on food and coughs which occurs on daily basis for the past 7 to 12 months. She describes having food which gets stuck in the throat or upper esophagus which triggers coughing and sometimes she wheezes.  She is able to speak and breathe during these episodes.  She starts to cough which results in coughing up the stuck food and sometimes she gargles with water to bring up the stuck food. No specific food triggers. She has a prior history of GERD for which she takes Omeprazole  40 daily for at least the past 5 years, initially took Omeprazole  20 mg then 40 mg for the past 2 to 3 years. She has acid reflux if she eats late at night especially if she eats peanut butter products she is relieved by taking Tums. No upper or lower abdominal pain. Her bowel pattern varies. She sometimes has constipation for day one or 2 then passes loose stool then the next day passes a good formed brown stool. She sometimes has rockhard stools. No bloody or black stools. She underwent an EGD due to having atypical chest pain 05/29/2017 which showed a normal esophagus, possible early GAVE otherwise was normal. She underwent a colonoscopy 03/01/2020 which identified 9 polyps removed from the colon, path report consistent with tubular adenomas, hyperplastic and sessile serrated polyps. She was advised to repeat a colonoscopy in 3 years. Paternal grandfather with history of colon cancer. She  has a history of CAD with past MI in 2001. No angina or shortness of breath. On ASA 81 mg daily.  History of CHF on Furosemide  80 mg daily and Spironolactone  50 mg daily.  ECHO 05/2021 showed LV EF 55 to 60% with grade 1 diastolic dysfunction.  History of asthma with COPD overlap syndrome. She reported presenting to urgent care in 2022 with respiratory symptoms and her oxygen  saturations were low therefore she was prescribed home oxygen  for a few months which was discontinued more than one year ago.  He stated asthma is well-controlled at this time, no cough, wheezing or shortness of breath.     Latest Ref Rng & Units 05/17/2022    9:31 AM 12/03/2021    9:25 AM 12/03/2021    9:16 AM  CBC  WBC 4.0 - 10.5 K/uL 6.6     Hemoglobin 12.0 - 15.0 g/dL 83.5  86.3    85.3  87.3   Hematocrit 36.0 - 46.0 % 49.2  40.0    43.0  37.0   Platelets 150.0 - 400.0 K/uL 192.0          Latest Ref Rng & Units 05/17/2022    9:31 AM 12/03/2021    9:25 AM 12/03/2021    9:16 AM  CMP  Glucose 70 - 99 mg/dL 98     BUN 6 - 23 mg/dL 17     Creatinine 9.59 - 1.20 mg/dL 8.56     Sodium 864 - 854 mEq/L 143  144    141  146   Potassium 3.5 - 5.1 mEq/L 3.9  3.2  3.7  3.0   Chloride 96 - 112 mEq/L 97     CO2 19 - 32 mEq/L 35     Calcium  8.4 - 10.5 mg/dL 9.2     Total Protein 6.0 - 8.3 g/dL 7.1     Total Bilirubin 0.2 - 1.2 mg/dL 0.6     Alkaline Phos 39 - 117 U/L 76     AST 0 - 37 U/L 20     ALT 0 - 35 U/L 20        Cardiac cath 12/03/2021: 1. Minimal CAD 2. Normal EF 3. Mild pulmonary HTN with normal PVR  ECHO 06/05/2021:  Left ventricular ejection fraction, by estimation, is 55 to 60%. The left ventricle has normal function. The left ventricle has no regional wall motion abnormalities. Left ventricular diastolic parameters are consistent with Grade I diastolic dysfunction (impaired relaxation). 1. Right ventricular systolic function is normal. The right ventricular size is normal. Tricuspid regurgitation signal  is inadequate for assessing PA pressure. 2. The mitral valve is grossly normal. No evidence of mitral valve regurgitation. No evidence of mitral stenosis. 3. The aortic valve is tricuspid. Aortic valve regurgitation is not visualized. No aortic stenosis is present.   PAST GI PROCEDURES:  Colonoscopy 03/01/2020: - Nine 1 to 6 mm polyps in the sigmoid colon, in the descending colon, in the ascending colon and in the cecum, removed with a cold snare. Resected and retrieved. - Diverticulosis in the entire examined colon. - The examination was otherwise normal on direct and retroflexion views.  - 3 year recall colonoscopy  1. Surgical [P], colon, cecum, polyp (2) - TUBULAR ADENOMA(S). - NO HIGH GRADE DYSPLASIA OR CARCINOMA. 2. Surgical [P], colon, ascending, polyp (5) - TUBULAR ADENOMA(S). - NO HIGH GRADE DYSPLASIA OR CARCINOMA. 3. Surgical [P], colon, descending, polyp - HYPERPLASTIC POLYP. - NO ADENOMATOUS CHANGE OR CARCINOMA. 4. Surgical [P], colon, sigmoid, polyp - SESSILE SERRATED POLYP WITHOUT CYTOLOGIC DYSPLASIA  EGD 05/29/2017: - Normal esophagus. - Possibly early GAVE.  - Otherwise normal exam.  - No specimens collected.  Past Medical History:  Diagnosis Date   Allergic rhinitis    gets shots per Dr. Fleeta Smock    Anemia 2001   Anginal pain (HCC)    Asthma    COPD (chronic obstructive pulmonary disease) (HCC)    CXR just showed mild COPD (10/24/2011)   Coronary artery disease    had MI in 2001, seees Dr. Darron at [facility]   GERD (gastroesophageal reflux disease)    Gynecological examination    sees Dr. Norval    History of cardiac catheterization 07-24-07   showed nonconclusive disease   Hyperlipidemia    Hypertension    Hypothyroidism    Migraines    have a history of migraines; haven't had one for years (10/24/2011)   Myocardial infarction [facility]) 2001?   in her 58's   Past Surgical History:  Procedure Laterality Date   CARDIAC CATHETERIZATION  2009    CESAREAN SECTION  1984   COLONOSCOPY  03/01/2020   per Dr. Abran, adenomatous  polyps, repeat in 3 yrs   ESOPHAGOGASTRODUODENOSCOPY  05/29/2017   per Dr. Abran, normal except slight gastritis    HERNIA REPAIR  ~ 2007   ventral hernia repair   RIGHT/LEFT HEART CATH AND CORONARY ANGIOGRAPHY N/A 12/03/2021   Procedure: RIGHT/LEFT HEART CATH AND CORONARY ANGIOGRAPHY;  Surgeon: Cherrie Toribio SAUNDERS, MD;  Location: [facility] INVASIVE [facility];  Service: Cardiovascular;  Laterality: N/A;   THYROIDECTOMY  1990's   TONSILLECTOMY     when I was a kid   Social History: She is divorced.  She is retired.  She has 1 daughter.  Past smoker, quit smoking cigarettes 16 years ago.  No alcohol use.  No drug use.  Family History: Paternal grandfather with history of colon cancer.  Father with heart disease and diabetes.  Mother with pulmonary fibrosis.   Allergies  Allergen Reactions   Sulfa Antibiotics Hives      Outpatient Encounter Medications as of 08/14/2023  Medication Sig   albuterol  (PROVENTIL ) (2.5 MG/3ML) 0.083% nebulizer solution INHALE 3 MLS (ONE VIAL) BY NEBULIZATION EVERY 4 HOURS AS NEEDED FOR WHEEZING OR SHORTNESS OF BREATH.   albuterol  (VENTOLIN  HFA) 108 (90 Base) MCG/ACT inhaler Inhale 2 puffs into the lungs every 4 (four) hours as needed for wheezing or shortness of breath.   allopurinol  (ZYLOPRIM ) 300 MG tablet TAKE 1 TABLET TWICE DAILY   aspirin  81 MG tablet Take 81 mg by mouth at bedtime.    atorvastatin  (LIPITOR) 80 MG tablet TAKE 1 TABLET EVERY DAY   benzonatate  (TESSALON ) 100 MG capsule Take 1 capsule (100 mg total) by mouth 3 (three) times daily as needed for cough.   calcitRIOL  (ROCALTROL ) 0.25 MCG capsule TAKE 1 CAPSULE TWICE DAILY   calcium  citrate (CALCITRATE) 950 (200 Ca) MG tablet Take 1 tablet (200 mg of elemental calcium  total) by mouth 3 (three) times daily. (Patient taking differently: Take 200-400 mg of elemental calcium  by mouth See admin instructions. 200 mg in the  morning, 400 mg in the evening)   cetirizine (ZYRTEC) 10 MG tablet Take 10 mg by mouth daily.   colchicine  0.6 MG tablet TAKE 1 TABLET BY MOUTH EVERY 6 (SIX) HOURS AS NEEDED FOR GOUT   escitalopram  (LEXAPRO ) 20 MG tablet TAKE 1 TABLET EVERY DAY   fluticasone  (FLONASE ) 50 MCG/ACT nasal spray Place 2 sprays into both nostrils daily.   furosemide  (LASIX ) 80 MG tablet TAKE 1 TABLET EVERY DAY   gabapentin  (NEURONTIN ) 300 MG capsule Take 1 capsule (300 mg total) by mouth 2 (two) times daily.   HYDROcodone -acetaminophen  (NORCO) 10-325 MG tablet Take 1 tablet by mouth 2 (two) times daily as needed for moderate pain (pain score 4-6).   JARDIANCE  10 MG TABS tablet TAKE 1 TABLET EVERY DAY BEFORE BREAKFAST   levothyroxine  (SYNTHROID ) 200 MCG tablet TAKE 1 TABLET EVERY DAY   magnesium  oxide (MAG-OX) 400 (240 Mg) MG tablet Take 400 mg by mouth 2 (two) times daily.   metoprolol  tartrate (LOPRESSOR ) 25 MG tablet TAKE 1 TABLET TWICE DAILY   montelukast  (SINGULAIR ) 10 MG tablet TAKE 1 TABLET AT BEDTIME   Multiple Vitamin (MULTIVITAMIN) tablet Take 1 tablet by mouth at bedtime.   Na Sulfate-K Sulfate-Mg Sulfate concentrate (SUPREP) 17.5-3.13-1.6 GM/177ML SOLN Take 1 kit (354 mLs total) by mouth once for 1 dose.   omeprazole  (PRILOSEC) 40 MG capsule TAKE 1 CAPSULE TWICE DAILY   spironolactone  (ALDACTONE ) 25 MG tablet TAKE 1 TABLET EVERY DAY   temazepam  (RESTORIL ) 30 MG capsule Take 1 capsule (30 mg total) by mouth at bedtime as needed for sleep.   OXYGEN  Inhale 3-4 L into the lungs continuous. (Patient not taking: Reported on 08/14/2023)   [DISCONTINUED] temazepam  (RESTORIL ) 30 MG capsule Take 1 capsule (30 mg total) by mouth at bedtime as needed for sleep.   No facility-administered encounter medications on file as of 08/14/2023.    REVIEW OF SYSTEMS:  Gen: Denies fever, sweats or chills. No weight  loss.  CV: Denies chest pain, palpitations or edema. Resp: Denies cough, shortness of breath of hemoptysis.  GI:  See HPI. GU: Denies urinary burning, blood in urine, increased urinary frequency or incontinence. MS: Denies joint pain, muscles aches or weakness. Derm: Denies rash, itchiness, skin lesions or unhealing ulcers. Psych: Denies depression, anxiety, memory loss or confusion. Heme: Denies bruising, easy bleeding. Neuro:  Denies headaches, dizziness or paresthesias. Endo:  Denies any problems with DM, thyroid  or adrenal function.  PHYSICAL EXAM: BP 132/80   Pulse 63   Ht 5' 7.5 (1.715 m)   Wt 280 lb (127 kg)   BMI 43.21 kg/m  General: in no acute distress. Head: Normocephalic and atraumatic. Eyes:  Sclerae non-icteric, conjunctive pink. Ears: Normal auditory acuity. Mouth: Dentition intact. No ulcers or lesions.  Neck: Supple, no lymphadenopathy or thyromegaly.  Lungs: Clear bilaterally to auscultation without wheezes, crackles or rhonchi. Heart: Regular rate and rhythm. No murmur, rub or gallop appreciated.  Abdomen: Soft, nontender, nondistended. No masses. No hepatosplenomegaly. Normoactive bowel sounds x 4 quadrants.  Rectal: Deferred.  Musculoskeletal: Symmetrical with no gross deformities. Skin: Warm and dry. No rash or lesions on visible extremities. Extremities: Mild bilateral ankle edema.  Neurological: Alert oriented x 4, no focal deficits.  Psychological: Alert and cooperative. Normal mood and affect.  ASSESSMENT AND PLAN:  68 year old female with a history of colon polyps.  Paternal grandfather with history of colon cancer. -Colonoscopy benefits and risks discussed including risk with sedation, risk of bleeding, perforation and infection   History of GERD with new onset dysphagia for the past 7 to 12 months.  On Omeprazole  40 mg daily.  EGD 05/2017 showed a normal esophagus with possible GAVE. -EGD with possible esophageal dilatation benefits and risks discussed including risk with sedation, risk of bleeding, perforation and infection  -I discussed scheduling a barium  swallow study with tablet if EGD unrevealing -Patient instructed to avoid eating large pieces of meat, bread or rice  Coronary artery disease s/p MI 2001. No angina.   CHF.  LVEF 55 to 60% per echo 05/2021.  On furosemide  and spironolactone .  Asthma, mild COPD.  Asymptomatic.  CC:  Johnny Garnette LABOR, MD

## 2023-08-14 NOTE — Patient Instructions (Addendum)
 Avoid eating large pieces of meat , bread or , rice.  You have been scheduled for a colonoscopy. Please follow written instructions given to you at your visit today.   If you use inhalers (even only as needed), please bring them with you on the day of your procedure.  DO NOT TAKE 7 DAYS PRIOR TO TEST- Trulicity (dulaglutide) Ozempic , Wegovy  (semaglutide ) Mounjaro  (tirzepatide ) Bydureon Bcise (exanatide extended release)  DO NOT TAKE 1 DAY PRIOR TO YOUR TEST Rybelsus  (semaglutide ) Adlyxin (lixisenatide) Victoza (liraglutide) Byetta (exanatide) ___________________________________________________________________________   _______________________________________________________  If your blood pressure at your visit was 140/90 or greater, please contact your primary care physician to follow up on this.  _______________________________________________________  If you are age 61 or older, your body mass index should be between 23-30. Your Body mass index is 43.21 kg/m. If this is out of the aforementioned range listed, please consider follow up with your Primary Care Provider.  If you are age 17 or younger, your body mass index should be between 19-25. Your Body mass index is 43.21 kg/m. If this is out of the aformentioned range listed, please consider follow up with your Primary Care Provider.   ________________________________________________________  The [facility] providers would like to encourage you to use MYCHART to communicate with providers for non-urgent requests or questions.  Due to long hold times on the telephone, sending your provider a message by [facility] may be a faster and more efficient way to get a response.  Please allow 48 business hours for a response.  Please remember that this is for non-urgent requests.  _______________________________________________________   It was a pleasure to see you today!  Thank you for trusting me with your gastrointestinal care!

## 2023-08-14 NOTE — Progress Notes (Signed)
 Noted

## 2023-08-14 NOTE — Telephone Encounter (Signed)
 Copied from CRM [id number]. Topic: Clinical - Medication Refill >> Aug 14, 2023  9:28 AM Viola F wrote: Medication: temazepam  (RESTORIL ) 30 MG capsule [[id number]]  Has the patient contacted their pharmacy? Yes (Agent: If no, request that the patient contact the pharmacy for the refill. If patient does not wish to contact the pharmacy document the reason why and proceed with request.) (Agent: If yes, when and what did the pharmacy advise?)  This is the patient's preferred pharmacy:    [facility] [address] [place] [address] Phone: [phone] Fax: [phone]  Is this the correct pharmacy for this prescription? Yes If no, delete pharmacy and type the correct one.   Has the prescription been filled recently? Yes  Is the patient out of the medication? Yes  Has the patient been seen for an appointment in the last year OR does the patient have an upcoming appointment? Yes  Can we respond through MyChart? Yes  Agent: Please be advised that Rx refills may take up to 3 business days. We ask that you follow-up with your pharmacy.

## 2023-08-15 ENCOUNTER — Other Ambulatory Visit: Payer: Self-pay

## 2023-08-15 ENCOUNTER — Other Ambulatory Visit

## 2023-08-15 DIAGNOSIS — G8929 Other chronic pain: Secondary | ICD-10-CM | POA: Diagnosis not present

## 2023-08-16 ENCOUNTER — Other Ambulatory Visit: Payer: Self-pay | Admitting: Family Medicine

## 2023-08-16 DIAGNOSIS — M545 Low back pain, unspecified: Secondary | ICD-10-CM

## 2023-08-17 LAB — DRUG MONITORING, PANEL 8 WITH CONFIRMATION, URINE
6 Acetylmorphine: NEGATIVE ng/mL (ref <10)
Alcohol Metabolites: NEGATIVE ng/mL (ref <500)
Alphahydroxyalprazolam: NEGATIVE ng/mL (ref <25)
Alphahydroxymidazolam: NEGATIVE ng/mL (ref <50)
Alphahydroxytriazolam: NEGATIVE ng/mL (ref <50)
Aminoclonazepam: NEGATIVE ng/mL (ref <25)
Amphetamines: NEGATIVE ng/mL (ref <500)
Benzodiazepines: POSITIVE ng/mL — AB (ref <100)
Buprenorphine, Urine: NEGATIVE ng/mL (ref <5)
Cocaine Metabolite: NEGATIVE ng/mL (ref <150)
Creatinine: 13.9 mg/dL — ABNORMAL LOW (ref >=20.0)
Hydroxyethylflurazepam: NEGATIVE ng/mL (ref <50)
Lorazepam: NEGATIVE ng/mL (ref <50)
MDMA: NEGATIVE ng/mL (ref <500)
Marijuana Metabolite: NEGATIVE ng/mL (ref <20)
Nordiazepam: NEGATIVE ng/mL (ref <50)
Opiates: NEGATIVE ng/mL (ref <100)
Oxazepam: 65 ng/mL — ABNORMAL HIGH (ref <50)
Oxidant: NEGATIVE ug/mL (ref <200)
Oxycodone: NEGATIVE ng/mL (ref <100)
Specific Gravity: 1.006 (ref >=1.003)
Temazepam: 3225 ng/mL — ABNORMAL HIGH (ref <50)
pH: 6.9 (ref 4.5–9.0)

## 2023-08-17 LAB — DM TEMPLATE

## 2023-09-09 ENCOUNTER — Encounter: Admitting: Internal Medicine

## 2023-09-18 DIAGNOSIS — M1711 Unilateral primary osteoarthritis, right knee: Secondary | ICD-10-CM | POA: Diagnosis not present

## 2023-09-18 DIAGNOSIS — M1712 Unilateral primary osteoarthritis, left knee: Secondary | ICD-10-CM | POA: Diagnosis not present

## 2023-09-18 DIAGNOSIS — M25561 Pain in right knee: Secondary | ICD-10-CM | POA: Diagnosis not present

## 2023-09-18 DIAGNOSIS — M170 Bilateral primary osteoarthritis of knee: Secondary | ICD-10-CM | POA: Diagnosis not present

## 2023-09-22 ENCOUNTER — Encounter: Payer: Self-pay | Admitting: Internal Medicine

## 2023-09-30 ENCOUNTER — Ambulatory Visit: Admitting: Internal Medicine

## 2023-09-30 ENCOUNTER — Encounter: Payer: Self-pay | Admitting: Internal Medicine

## 2023-09-30 VITALS — BP 112/20 | HR 96 | Temp 97.5°F | Resp 15 | Ht 67.5 in | Wt 280.0 lb

## 2023-09-30 DIAGNOSIS — K219 Gastro-esophageal reflux disease without esophagitis: Secondary | ICD-10-CM

## 2023-09-30 DIAGNOSIS — Z1211 Encounter for screening for malignant neoplasm of colon: Secondary | ICD-10-CM | POA: Diagnosis not present

## 2023-09-30 DIAGNOSIS — Z860101 Personal history of adenomatous and serrated colon polyps: Secondary | ICD-10-CM

## 2023-09-30 DIAGNOSIS — K5730 Diverticulosis of large intestine without perforation or abscess without bleeding: Secondary | ICD-10-CM | POA: Diagnosis not present

## 2023-09-30 DIAGNOSIS — D124 Benign neoplasm of descending colon: Secondary | ICD-10-CM

## 2023-09-30 DIAGNOSIS — R1310 Dysphagia, unspecified: Secondary | ICD-10-CM | POA: Diagnosis not present

## 2023-09-30 DIAGNOSIS — N189 Chronic kidney disease, unspecified: Secondary | ICD-10-CM

## 2023-09-30 DIAGNOSIS — D125 Benign neoplasm of sigmoid colon: Secondary | ICD-10-CM | POA: Diagnosis not present

## 2023-09-30 DIAGNOSIS — K635 Polyp of colon: Secondary | ICD-10-CM | POA: Diagnosis not present

## 2023-09-30 DIAGNOSIS — K449 Diaphragmatic hernia without obstruction or gangrene: Secondary | ICD-10-CM

## 2023-09-30 DIAGNOSIS — D128 Benign neoplasm of rectum: Secondary | ICD-10-CM

## 2023-09-30 DIAGNOSIS — Z860100 Personal history of colon polyps, unspecified: Secondary | ICD-10-CM

## 2023-09-30 DIAGNOSIS — J449 Chronic obstructive pulmonary disease, unspecified: Secondary | ICD-10-CM | POA: Diagnosis not present

## 2023-09-30 DIAGNOSIS — I2510 Atherosclerotic heart disease of native coronary artery without angina pectoris: Secondary | ICD-10-CM | POA: Diagnosis not present

## 2023-09-30 DIAGNOSIS — K621 Rectal polyp: Secondary | ICD-10-CM | POA: Diagnosis not present

## 2023-09-30 HISTORY — DX: Chronic kidney disease, unspecified: N18.9

## 2023-09-30 MED ORDER — SODIUM CHLORIDE 0.9 % IV SOLN: 500.0000 mL | Freq: Once | INTRAVENOUS | Status: DC

## 2023-09-30 NOTE — Op Note (Signed)
 [facility] Patient Name: Nancy [name] Procedure Date: 09/30/2023 9:17 AM MRN: [id number] Endoscopist: Norleen SAILOR. Abran , MD, [id number] Age: 68 Referring MD:  Date of Birth: April 03, 1955 Gender: Female Account #: [id number] Procedure:                Upper GI endoscopy Indications:              Dysphagia (describes coughing or choking spells                            while eating), Esophageal reflux Medicines:                Monitored Anesthesia Care Procedure:                Pre-Anesthesia Assessment:                           - Prior to the procedure, a History and Physical                            was performed, and patient medications and                            allergies were reviewed. The patient's tolerance of                            previous anesthesia was also reviewed. The risks                            and benefits of the procedure and the sedation                            options and risks were discussed with the patient.                            All questions were answered, and informed consent                            was obtained. Prior Anticoagulants: The patient has                            taken no anticoagulant or antiplatelet agents. ASA                            Grade Assessment: III - A patient with severe                            systemic disease. After reviewing the risks and                            benefits, the patient was deemed in satisfactory                            condition to undergo the procedure.  After obtaining informed consent, the endoscope was                            passed under direct vision. Throughout the                            procedure, the patient's blood pressure, pulse, and                            oxygen  saturations were monitored continuously. The                            GIF [id number] #[id number] was introduced through the                            mouth, and advanced to  the second part of duodenum.                            The upper GI endoscopy was accomplished without                            difficulty. The patient tolerated the procedure                            well. Scope In: Scope Out: Findings:                 The esophagus was normal. No inflammation or                            stricture.                           The stomach small hiatal hernia and mild antral                            driving. Mucosa intact.                           The examined duodenum was normal.                           The cardia and gastric fundus were normal on                            retroflexion. Complications:            No immediate complications. Estimated Blood Loss:     Estimated blood loss: none. Impression:               1. Essentially normal EGD                           2. GERD                           3. Cough and choking spells while eating. Rule out  oropharyngeal dysphagia. Recommendation:           - Patient has a contact number available for                            emergencies. The signs and symptoms of potential                            delayed complications were discussed with the                            patient. Return to normal activities tomorrow.                            Written discharge instructions were provided to the                            patient.                           - Resume previous diet. Chew food well. Eat smaller                            bites                           - Continue present medications.                           - Reflux precautions with attention to weight loss                           - Schedule modified barium swallow with speech                            pathology rule out oropharyngeal dysphagia. Norleen SAILOR. Abran, MD 09/30/2023 10:12:57 AM This report has been signed electronically.

## 2023-09-30 NOTE — Progress Notes (Signed)
 Called to room to assist during endoscopic procedure.  Patient ID and intended procedure confirmed with present staff. Received instructions for my participation in the procedure from the performing physician.

## 2023-09-30 NOTE — Progress Notes (Signed)
 Sedate, gd SR, tolerated procedure well, VSS, report to RN

## 2023-09-30 NOTE — Patient Instructions (Signed)
 Handouts on polyps & diverticulosis given to you today.   Await pathology results on polyps removed   HANDOUT on  gerd ( reflux precautions) given to you  Barium Swallow with speech pathology will be schedules by Dr Nancyann office   Chew well ,eat smaller bites   YOU HAD AN ENDOSCOPIC PROCEDURE TODAY AT THE [facility]:   Refer to the procedure report that was given to you for any specific questions about what was found during the examination.  If the procedure report does not answer your questions, please call your gastroenterologist to clarify.  If you requested that your care partner not be given the details of your procedure findings, then the procedure report has been included in a sealed envelope for you to review at your convenience later.  YOU SHOULD EXPECT: Some feelings of bloating in the abdomen. Passage of more gas than usual.  Walking can help get rid of the air that was put into your GI tract during the procedure and reduce the bloating. If you had a lower endoscopy (such as a colonoscopy or flexible sigmoidoscopy) you may notice spotting of blood in your stool or on the toilet paper. If you underwent a bowel prep for your procedure, you may not have a normal bowel movement for a few days.  Please Note:  You might notice some irritation and congestion in your nose or some drainage.  This is from the oxygen  used during your procedure.  There is no need for concern and it should clear up in a day or so.  SYMPTOMS TO REPORT IMMEDIATELY:  Following lower endoscopy (colonoscopy or flexible sigmoidoscopy):  Excessive amounts of blood in the stool  Significant tenderness or worsening of abdominal pains  Swelling of the abdomen that is new, acute  Fever of 100F or higher  Following upper endoscopy (EGD)  Vomiting of blood or coffee ground material  New chest pain or pain under the shoulder blades  Painful or persistently difficult swallowing  New shortness of  breath  Fever of 100F or higher  Black, tarry-looking stools  For urgent or emergent issues, a gastroenterologist can be reached at any hour by calling [phone]. Do not use MyChart messaging for urgent concerns.    DIET:  We do recommend a small meal at first, but then you may proceed to your regular diet.  Drink plenty of fluids but you should avoid alcoholic beverages for 24 hours.  ACTIVITY:  You should plan to take it easy for the rest of today and you should NOT DRIVE or use heavy machinery until tomorrow (because of the sedation medicines used during the test).    FOLLOW UP: Our staff will call the number listed on your records the next business day following your procedure.  We will call around 7:15- 8:00 am to check on you and address any questions or concerns that you may have regarding the information given to you following your procedure. If we do not reach you, we will leave a message.     If any biopsies were taken you will be contacted by phone or by letter within the next 1-3 weeks.  Please call us  at [phone] if you have not heard about the biopsies in 3 weeks.    SIGNATURES/CONFIDENTIALITY: You and/or your care partner have signed paperwork which will be entered into your electronic medical record.  These signatures attest to the fact that that the information above on your After Visit Summary has  been reviewed and is understood.  Full responsibility of the confidentiality of this discharge information lies with you and/or your care-partner.

## 2023-09-30 NOTE — Op Note (Signed)
 [facility] Patient Name: Nancy [name] Procedure Date: 09/30/2023 9:22 AM MRN: [id number] Endoscopist: Norleen SAILOR. Abran , MD, [id number] Age: 68 Referring MD:  Date of Birth: 11/16/1955 Gender: Female Account #: [id number] Procedure:                Colonoscopy with cold snare polypectomy x 2; biopsy                            polypectomy x 1 Indications:              High risk colon cancer surveillance: Personal                            history of multiple (3 or more) adenomas, High risk                            colon cancer surveillance: Personal history of                            sessile serrated colon polyp (less than 10 mm in                            size) with no dysplasia. Previous examinations                            2010, 2022 Medicines:                Monitored Anesthesia Care Procedure:                Pre-Anesthesia Assessment:                           - Prior to the procedure, a History and Physical                            was performed, and patient medications and                            allergies were reviewed. The patient's tolerance of                            previous anesthesia was also reviewed. The risks                            and benefits of the procedure and the sedation                            options and risks were discussed with the patient.                            All questions were answered, and informed consent                            was obtained. Prior Anticoagulants: The patient has  taken no anticoagulant or antiplatelet agents. ASA                            Grade Assessment: III - A patient with severe                            systemic disease. After reviewing the risks and                            benefits, the patient was deemed in satisfactory                            condition to undergo the procedure.                           After obtaining informed consent, the  colonoscope                            was passed under direct vision. Throughout the                            procedure, the patient's blood pressure, pulse, and                            oxygen  saturations were monitored continuously. The                            Olympus Scope [id number] was introduced through the                            anus and advanced to the the cecum, identified by                            appendiceal orifice and ileocecal valve. The                            ileocecal valve, appendiceal orifice, and rectum                            were photographed. The quality of the bowel                            preparation was good. The colonoscopy was performed                            without difficulty. The patient tolerated the                            procedure well. The bowel preparation used was                            SUPREP via split dose instruction. Scope In: 9:41:00 AM Scope Out: 9:56:31 AM Scope Withdrawal Time: 0 hours 13 minutes 16 seconds  Total Procedure Duration:  0 hours 15 minutes 31 seconds  Findings:                 Two polyps were found in the sigmoid colon and                            descending colon. The polyps were 2 to 4 mm in                            size. These polyps were removed with a cold snare.                            Resection and retrieval were complete.                           A 1 mm polyp was found in the rectum. The polyp was                            removed with a jumbo cold forceps. Resection and                            retrieval were complete.                           Many diverticula were found in the left colon and                            right colon.                           The exam was otherwise without abnormality on                            direct and retroflexion views. Complications:            No immediate complications. Estimated blood loss:                             None. Estimated Blood Loss:     Estimated blood loss: none. Impression:               - Two 2 to 4 mm polyps in the sigmoid colon and in                            the descending colon, removed with a cold snare.                            Resected and retrieved.                           - One 1 mm polyp in the rectum, removed with a                            jumbo cold forceps. Resected and retrieved.                           -  Diverticulosis in the left colon and in the right                            colon.                           - The examination was otherwise normal on direct                            and retroflexion views. Recommendation:           - Repeat colonoscopy in 5 years for surveillance.                           - Patient has a contact number available for                            emergencies. The signs and symptoms of potential                            delayed complications were discussed with the                            patient. Return to normal activities tomorrow.                            Written discharge instructions were provided to the                            patient.                           - Resume previous diet.                           - Continue present medications.                           - Await pathology results. Norleen SAILOR. Abran, MD 09/30/2023 10:04:07 AM This report has been signed electronically.

## 2023-09-30 NOTE — Progress Notes (Signed)
Updated medical record with pt

## 2023-09-30 NOTE — Progress Notes (Signed)
 Expand All Collapse All        08/14/2023 Nancy [name] [id number] [birth year]     CHIEF COMPLAINT: Discuss scheduling a colonoscopy   HISTORY OF PRESENT ILLNESS: Nancy [name] is a 68 year old female with a past medical history of hypertension, hyperlipidemia, coronary artery disease status post MI 2001, diastolic CHF, asthma, minimal pulmonary hypertension per cardiac cath 11/2021, CKD, hypothyroidism, anemia, GERD and colon polyps. She is known by Dr. Abran. She presents today to discuss scheduling an upper endoscopy and colonoscopy. She describes having dysphagia, gets choked on food and coughs which occurs on daily basis for the past 7 to 12 months. She describes having food which gets stuck in the throat or upper esophagus which triggers coughing and sometimes she wheezes.  She is able to speak and breathe during these episodes.  She starts to cough which results in coughing up the stuck food and sometimes she gargles with water to bring up the stuck food. No specific food triggers. She has a prior history of GERD for which she takes Omeprazole  40 daily for at least the past 5 years, initially took Omeprazole  20 mg then 40 mg for the past 2 to 3 years. She has acid reflux if she eats late at night especially if she eats peanut butter products she is relieved by taking Tums. No upper or lower abdominal pain. Her bowel pattern varies. She sometimes has constipation for day one or 2 then passes loose stool then the next day passes a good formed brown stool. She sometimes has rockhard stools. No bloody or black stools. She underwent an EGD due to having atypical chest pain 05/29/2017 which showed a normal esophagus, possible early GAVE otherwise was normal. She underwent a colonoscopy 03/01/2020 which identified 9 polyps removed from the colon, path report consistent with tubular adenomas, hyperplastic and sessile serrated polyps. She was advised to repeat a colonoscopy in 3 years. Paternal grandfather  with history of colon cancer. She has a history of CAD with past MI in 2001. No angina or shortness of breath. On ASA 81 mg daily.  History of CHF on Furosemide  80 mg daily and Spironolactone  50 mg daily.  ECHO 05/2021 showed LV EF 55 to 60% with grade 1 diastolic dysfunction.  History of asthma with COPD overlap syndrome. She reported presenting to urgent care in 2022 with respiratory symptoms and her oxygen  saturations were low therefore she was prescribed home oxygen  for a few months which was discontinued more than one year ago.  He stated asthma is well-controlled at this time, no cough, wheezing or shortness of breath.       Latest Ref Rng & Units 05/17/2022    9:31 AM 12/03/2021    9:25 AM 12/03/2021    9:16 AM  CBC  WBC 4.0 - 10.5 K/uL 6.6       Hemoglobin 12.0 - 15.0 g/dL 83.5  86.3    85.3  87.3   Hematocrit 36.0 - 46.0 % 49.2  40.0    43.0  37.0   Platelets 150.0 - 400.0 K/uL 192.0             Latest Ref Rng & Units 05/17/2022    9:31 AM 12/03/2021    9:25 AM 12/03/2021    9:16 AM  CMP  Glucose 70 - 99 mg/dL 98       BUN 6 - 23 mg/dL 17       Creatinine 9.59 - 1.20 mg/dL 8.56  Sodium 135 - 145 mEq/L 143  144    141  146   Potassium 3.5 - 5.1 mEq/L 3.9  3.2    3.7  3.0   Chloride 96 - 112 mEq/L 97       CO2 19 - 32 mEq/L 35       Calcium  8.4 - 10.5 mg/dL 9.2       Total Protein 6.0 - 8.3 g/dL 7.1       Total Bilirubin 0.2 - 1.2 mg/dL 0.6       Alkaline Phos 39 - 117 U/L 76       AST 0 - 37 U/L 20       ALT 0 - 35 U/L 20           Cardiac cath 12/03/2021: 1. Minimal CAD 2. Normal EF 3. Mild pulmonary HTN with normal PVR   ECHO 06/05/2021:  Left ventricular ejection fraction, by estimation, is 55 to 60%. The left ventricle has normal function. The left ventricle has no regional wall motion abnormalities. Left ventricular diastolic parameters are consistent with Grade I diastolic dysfunction (impaired relaxation). 1. Right ventricular systolic function is normal. The  right ventricular size is normal. Tricuspid regurgitation signal is inadequate for assessing PA pressure. 2. The mitral valve is grossly normal. No evidence of mitral valve regurgitation. No evidence of mitral stenosis. 3. The aortic valve is tricuspid. Aortic valve regurgitation is not visualized. No aortic stenosis is present.    PAST GI PROCEDURES:   Colonoscopy 03/01/2020: - Nine 1 to 6 mm polyps in the sigmoid colon, in the descending colon, in the ascending colon and in the cecum, removed with a cold snare. Resected and retrieved. - Diverticulosis in the entire examined colon. - The examination was otherwise normal on direct and retroflexion views.  - 3 year recall colonoscopy  1. Surgical [P], colon, cecum, polyp (2) - TUBULAR ADENOMA(S). - NO HIGH GRADE DYSPLASIA OR CARCINOMA. 2. Surgical [P], colon, ascending, polyp (5) - TUBULAR ADENOMA(S). - NO HIGH GRADE DYSPLASIA OR CARCINOMA. 3. Surgical [P], colon, descending, polyp - HYPERPLASTIC POLYP. - NO ADENOMATOUS CHANGE OR CARCINOMA. 4. Surgical [P], colon, sigmoid, polyp - SESSILE SERRATED POLYP WITHOUT CYTOLOGIC DYSPLASIA   EGD 05/29/2017: - Normal esophagus. - Possibly early GAVE.  - Otherwise normal exam.  - No specimens collected.       Past Medical History:  Diagnosis Date   Allergic rhinitis      gets shots per Dr. Fleeta Smock    Anemia 2001   Anginal pain (HCC)     Asthma     COPD (chronic obstructive pulmonary disease) (HCC)      CXR just showed mild COPD (10/24/2011)   Coronary artery disease      had MI in 2001, seees Dr. Darron at [facility]   GERD (gastroesophageal reflux disease)     Gynecological examination      sees Dr. Norval    History of cardiac catheterization 07-24-07    showed nonconclusive disease   Hyperlipidemia     Hypertension     Hypothyroidism     Migraines      have a history of migraines; haven't had one for years (10/24/2011)   Myocardial infarction [facility]) 2001?    in her  89's             Past Surgical History:  Procedure Laterality Date   CARDIAC CATHETERIZATION   2009   CESAREAN SECTION   1984  COLONOSCOPY   03/01/2020    per Dr. Abran, adenomatous  polyps, repeat in 3 yrs   ESOPHAGOGASTRODUODENOSCOPY   05/29/2017    per Dr. Abran, normal except slight gastritis    HERNIA REPAIR   ~ 2007    ventral hernia repair   RIGHT/LEFT HEART CATH AND CORONARY ANGIOGRAPHY N/A 12/03/2021    Procedure: RIGHT/LEFT HEART CATH AND CORONARY ANGIOGRAPHY;  Surgeon: Cherrie Toribio SAUNDERS, MD;  Location: [facility] INVASIVE CV [facility];  Service: Cardiovascular;  Laterality: N/A;   THYROIDECTOMY   1990's   TONSILLECTOMY        when I was a kid        Social History: She is divorced.  She is retired.  She has 1 daughter.  Past smoker, quit smoking cigarettes 16 years ago.  No alcohol use.  No drug use.   Family History: Paternal grandfather with history of colon cancer.  Father with heart disease and diabetes.  Mother with pulmonary fibrosis.     Allergies      Allergies  Allergen Reactions   Sulfa Antibiotics Hives              Outpatient Encounter Medications as of 08/14/2023  Medication Sig   albuterol  (PROVENTIL ) (2.5 MG/3ML) 0.083% nebulizer solution INHALE 3 MLS (ONE VIAL) BY NEBULIZATION EVERY 4 HOURS AS NEEDED FOR WHEEZING OR SHORTNESS OF BREATH.   albuterol  (VENTOLIN  HFA) 108 (90 Base) MCG/ACT inhaler Inhale 2 puffs into the lungs every 4 (four) hours as needed for wheezing or shortness of breath.   allopurinol  (ZYLOPRIM ) 300 MG tablet TAKE 1 TABLET TWICE DAILY   aspirin  81 MG tablet Take 81 mg by mouth at bedtime.    atorvastatin  (LIPITOR) 80 MG tablet TAKE 1 TABLET EVERY DAY   benzonatate  (TESSALON ) 100 MG capsule Take 1 capsule (100 mg total) by mouth 3 (three) times daily as needed for cough.   calcitRIOL  (ROCALTROL ) 0.25 MCG capsule TAKE 1 CAPSULE TWICE DAILY   calcium  citrate (CALCITRATE) 950 (200 Ca) MG tablet Take 1 tablet (200 mg of elemental calcium   total) by mouth 3 (three) times daily. (Patient taking differently: Take 200-400 mg of elemental calcium  by mouth See admin instructions. 200 mg in the morning, 400 mg in the evening)   cetirizine (ZYRTEC) 10 MG tablet Take 10 mg by mouth daily.   colchicine  0.6 MG tablet TAKE 1 TABLET BY MOUTH EVERY 6 (SIX) HOURS AS NEEDED FOR GOUT   escitalopram  (LEXAPRO ) 20 MG tablet TAKE 1 TABLET EVERY DAY   fluticasone  (FLONASE ) 50 MCG/ACT nasal spray Place 2 sprays into both nostrils daily.   furosemide  (LASIX ) 80 MG tablet TAKE 1 TABLET EVERY DAY   gabapentin  (NEURONTIN ) 300 MG capsule Take 1 capsule (300 mg total) by mouth 2 (two) times daily.   HYDROcodone -acetaminophen  (NORCO) 10-325 MG tablet Take 1 tablet by mouth 2 (two) times daily as needed for moderate pain (pain score 4-6).   JARDIANCE  10 MG TABS tablet TAKE 1 TABLET EVERY DAY BEFORE BREAKFAST   levothyroxine  (SYNTHROID ) 200 MCG tablet TAKE 1 TABLET EVERY DAY   magnesium  oxide (MAG-OX) 400 (240 Mg) MG tablet Take 400 mg by mouth 2 (two) times daily.   metoprolol  tartrate (LOPRESSOR ) 25 MG tablet TAKE 1 TABLET TWICE DAILY   montelukast  (SINGULAIR ) 10 MG tablet TAKE 1 TABLET AT BEDTIME   Multiple Vitamin (MULTIVITAMIN) tablet Take 1 tablet by mouth at bedtime.   Na Sulfate-K Sulfate-Mg Sulfate concentrate (SUPREP) 17.5-3.13-1.6 GM/177ML SOLN Take 1 kit (354 mLs  total) by mouth once for 1 dose.   omeprazole  (PRILOSEC) 40 MG capsule TAKE 1 CAPSULE TWICE DAILY   spironolactone  (ALDACTONE ) 25 MG tablet TAKE 1 TABLET EVERY DAY   temazepam  (RESTORIL ) 30 MG capsule Take 1 capsule (30 mg total) by mouth at bedtime as needed for sleep.   OXYGEN  Inhale 3-4 L into the lungs continuous. (Patient not taking: Reported on 08/14/2023)   [DISCONTINUED] temazepam  (RESTORIL ) 30 MG capsule Take 1 capsule (30 mg total) by mouth at bedtime as needed for sleep.      No facility-administered encounter medications on file as of 08/14/2023.        REVIEW OF SYSTEMS:   Gen: Denies fever, sweats or chills. No weight loss.  CV: Denies chest pain, palpitations or edema. Resp: Denies cough, shortness of breath of hemoptysis.  GI: See HPI. GU: Denies urinary burning, blood in urine, increased urinary frequency or incontinence. MS: Denies joint pain, muscles aches or weakness. Derm: Denies rash, itchiness, skin lesions or unhealing ulcers. Psych: Denies depression, anxiety, memory loss or confusion. Heme: Denies bruising, easy bleeding. Neuro:  Denies headaches, dizziness or paresthesias. Endo:  Denies any problems with DM, thyroid  or adrenal function.   PHYSICAL EXAM: BP 132/80   Pulse 63   Ht 5' 7.5 (1.715 m)   Wt 280 lb (127 kg)   BMI 43.21 kg/m  General: in no acute distress. Head: Normocephalic and atraumatic. Eyes:  Sclerae non-icteric, conjunctive pink. Ears: Normal auditory acuity. Mouth: Dentition intact. No ulcers or lesions.  Neck: Supple, no lymphadenopathy or thyromegaly.  Lungs: Clear bilaterally to auscultation without wheezes, crackles or rhonchi. Heart: Regular rate and rhythm. No murmur, rub or gallop appreciated.  Abdomen: Soft, nontender, nondistended. No masses. No hepatosplenomegaly. Normoactive bowel sounds x 4 quadrants.  Rectal: Deferred.  Musculoskeletal: Symmetrical with no gross deformities. Skin: Warm and dry. No rash or lesions on visible extremities. Extremities: Mild bilateral ankle edema.  Neurological: Alert oriented x 4, no focal deficits.  Psychological: Alert and cooperative. Normal mood and affect.   ASSESSMENT AND PLAN:   68 year old female with a history of colon polyps.  Paternal grandfather with history of colon cancer. -Colonoscopy benefits and risks discussed including risk with sedation, risk of bleeding, perforation and infection    History of GERD with new onset dysphagia for the past 7 to 12 months.  On Omeprazole  40 mg daily.  EGD 05/2017 showed a normal esophagus with possible GAVE. -EGD with  possible esophageal dilatation benefits and risks discussed including risk with sedation, risk of bleeding, perforation and infection  -I discussed scheduling a barium swallow study with tablet if EGD unrevealing -Patient instructed to avoid eating large pieces of meat, bread or rice   Coronary artery disease s/p MI 2001. No angina.    CHF.  LVEF 55 to 60% per echo 05/2021.  On furosemide  and spironolactone .   Asthma, mild COPD.  Asymptomatic.   CC:  Johnny Garnette LABOR, MD            Recent H&P as above.  No interval change.  Now for colonoscopy and upper endoscopy with possible esophageal dilation.

## 2023-10-01 ENCOUNTER — Telehealth: Payer: Self-pay | Admitting: *Deleted

## 2023-10-01 DIAGNOSIS — M25561 Pain in right knee: Secondary | ICD-10-CM | POA: Diagnosis not present

## 2023-10-01 NOTE — Telephone Encounter (Signed)
  Follow up Call-     09/30/2023    8:21 AM  Call back number  Post procedure Call Back phone  # [phone]  Permission to leave phone message Yes    Post procedure follow up phone call. No answer at number given.  Left message on voicemail.

## 2023-10-02 ENCOUNTER — Other Ambulatory Visit: Payer: Self-pay

## 2023-10-02 ENCOUNTER — Other Ambulatory Visit (HOSPITAL_COMMUNITY): Payer: Self-pay | Admitting: Internal Medicine

## 2023-10-02 DIAGNOSIS — R1310 Dysphagia, unspecified: Secondary | ICD-10-CM

## 2023-10-02 DIAGNOSIS — R059 Cough, unspecified: Secondary | ICD-10-CM

## 2023-10-02 LAB — SURGICAL PATHOLOGY

## 2023-10-06 ENCOUNTER — Encounter (HOSPITAL_COMMUNITY)

## 2023-10-07 ENCOUNTER — Ambulatory Visit: Payer: Self-pay | Admitting: Internal Medicine

## 2023-10-08 DIAGNOSIS — S83411A Sprain of medial collateral ligament of right knee, initial encounter: Secondary | ICD-10-CM | POA: Diagnosis not present

## 2023-10-08 DIAGNOSIS — S83241A Other tear of medial meniscus, current injury, right knee, initial encounter: Secondary | ICD-10-CM | POA: Diagnosis not present

## 2023-10-10 DIAGNOSIS — J019 Acute sinusitis, unspecified: Secondary | ICD-10-CM | POA: Diagnosis not present

## 2023-10-10 DIAGNOSIS — J209 Acute bronchitis, unspecified: Secondary | ICD-10-CM | POA: Diagnosis not present

## 2023-10-10 DIAGNOSIS — J029 Acute pharyngitis, unspecified: Secondary | ICD-10-CM | POA: Diagnosis not present

## 2023-10-10 DIAGNOSIS — Z03818 Encounter for observation for suspected exposure to other biological agents ruled out: Secondary | ICD-10-CM | POA: Diagnosis not present

## 2023-10-16 DIAGNOSIS — M25561 Pain in right knee: Secondary | ICD-10-CM | POA: Diagnosis not present

## 2023-10-29 ENCOUNTER — Other Ambulatory Visit: Payer: Self-pay | Admitting: Family Medicine

## 2023-10-29 DIAGNOSIS — Z1231 Encounter for screening mammogram for malignant neoplasm of breast: Secondary | ICD-10-CM

## 2023-11-03 DIAGNOSIS — M1711 Unilateral primary osteoarthritis, right knee: Secondary | ICD-10-CM | POA: Diagnosis not present

## 2023-11-03 DIAGNOSIS — S83241A Other tear of medial meniscus, current injury, right knee, initial encounter: Secondary | ICD-10-CM | POA: Diagnosis not present

## 2023-11-04 ENCOUNTER — Other Ambulatory Visit: Payer: Self-pay | Admitting: Family Medicine

## 2023-11-12 DIAGNOSIS — M25561 Pain in right knee: Secondary | ICD-10-CM | POA: Diagnosis not present

## 2023-11-12 DIAGNOSIS — M6281 Muscle weakness (generalized): Secondary | ICD-10-CM | POA: Diagnosis not present

## 2023-11-19 ENCOUNTER — Ambulatory Visit
Admission: RE | Admit: 2023-11-19 | Discharge: 2023-11-19 | Disposition: A | Source: Ambulatory Visit | Attending: Family Medicine | Admitting: Family Medicine

## 2023-11-19 DIAGNOSIS — Z1231 Encounter for screening mammogram for malignant neoplasm of breast: Secondary | ICD-10-CM

## 2023-11-19 DIAGNOSIS — M6281 Muscle weakness (generalized): Secondary | ICD-10-CM | POA: Diagnosis not present

## 2023-11-19 DIAGNOSIS — M25561 Pain in right knee: Secondary | ICD-10-CM | POA: Diagnosis not present

## 2023-11-20 ENCOUNTER — Other Ambulatory Visit: Payer: Self-pay | Admitting: Family Medicine

## 2023-11-20 DIAGNOSIS — M1711 Unilateral primary osteoarthritis, right knee: Secondary | ICD-10-CM | POA: Diagnosis not present

## 2023-11-20 DIAGNOSIS — M25561 Pain in right knee: Secondary | ICD-10-CM | POA: Diagnosis not present

## 2023-11-21 DIAGNOSIS — M6281 Muscle weakness (generalized): Secondary | ICD-10-CM | POA: Diagnosis not present

## 2023-11-21 DIAGNOSIS — M25561 Pain in right knee: Secondary | ICD-10-CM | POA: Diagnosis not present

## 2023-11-26 ENCOUNTER — Other Ambulatory Visit: Payer: Self-pay | Admitting: Family Medicine

## 2023-11-26 DIAGNOSIS — M6281 Muscle weakness (generalized): Secondary | ICD-10-CM | POA: Diagnosis not present

## 2023-11-26 DIAGNOSIS — M25561 Pain in right knee: Secondary | ICD-10-CM | POA: Diagnosis not present

## 2023-11-27 ENCOUNTER — Encounter (HOSPITAL_COMMUNITY): Payer: Self-pay

## 2023-11-27 ENCOUNTER — Other Ambulatory Visit (HOSPITAL_COMMUNITY): Payer: Self-pay

## 2023-11-27 ENCOUNTER — Other Ambulatory Visit: Payer: Self-pay

## 2023-11-27 DIAGNOSIS — M25561 Pain in right knee: Secondary | ICD-10-CM | POA: Diagnosis not present

## 2023-11-27 DIAGNOSIS — M1711 Unilateral primary osteoarthritis, right knee: Secondary | ICD-10-CM | POA: Diagnosis not present

## 2023-11-27 DIAGNOSIS — Z23 Encounter for immunization: Secondary | ICD-10-CM | POA: Diagnosis not present

## 2023-11-27 NOTE — Telephone Encounter (Signed)
 Pt is scheduled for VV for PMV on 11/28/23

## 2023-11-28 ENCOUNTER — Other Ambulatory Visit (HOSPITAL_COMMUNITY): Payer: Self-pay

## 2023-11-28 ENCOUNTER — Encounter: Payer: Self-pay | Admitting: Family Medicine

## 2023-11-28 ENCOUNTER — Telehealth (INDEPENDENT_AMBULATORY_CARE_PROVIDER_SITE_OTHER): Admitting: Family Medicine

## 2023-11-28 ENCOUNTER — Other Ambulatory Visit: Payer: Self-pay

## 2023-11-28 DIAGNOSIS — G8929 Other chronic pain: Secondary | ICD-10-CM

## 2023-11-28 DIAGNOSIS — M545 Low back pain, unspecified: Secondary | ICD-10-CM

## 2023-11-28 MED ORDER — HYDROCODONE-ACETAMINOPHEN 10-325 MG PO TABS
1.0000 | ORAL_TABLET | Freq: Three times a day (TID) | ORAL | 0 refills | Status: AC | PRN
  Filled 2023-11-28 (×2): qty 90, 30d supply, fill #0

## 2023-11-28 MED ORDER — HYDROCODONE-ACETAMINOPHEN 10-325 MG PO TABS
1.0000 | ORAL_TABLET | Freq: Three times a day (TID) | ORAL | 0 refills | Status: AC | PRN
  Filled 2023-11-28: qty 90, 30d supply, fill #0

## 2023-11-28 MED ORDER — GABAPENTIN 300 MG PO CAPS: ORAL_CAPSULE | ORAL | 3 refills | Status: AC

## 2023-11-28 MED ORDER — HYDROCODONE-ACETAMINOPHEN 10-325 MG PO TABS
1.0000 | ORAL_TABLET | Freq: Three times a day (TID) | ORAL | 0 refills | Status: AC | PRN
  Filled 2023-11-28 – 2024-03-04 (×3): qty 90, 30d supply, fill #0

## 2023-11-28 NOTE — Progress Notes (Signed)
 Subjective:    Patient ID: Nancy [name], female    DOB: [birth year], 68 y.o.   MRN: [id number]  HPI Virtual Visit via Video Note  I connected with the patient on 11/28/23 at  1:45 PM EDT by a video enabled telemedicine application and verified that I am speaking with the correct person using two identifiers.  Location patient: home Location provider:work or home office Persons participating in the virtual visit: patient, provider  I discussed the limitations of evaluation and management by telemedicine and the availability of in person appointments. The patient expressed understanding and agreed to proceed.   HPI: Here for pain management. She says her low back pain has gotten a little worse, and now she deals with right knee pain as well. She fell and suffered a torn MCL and a torn medial meniscus in the knee, and the combination of these two makes it hard to sleep at night.    ROS: See pertinent positives and negatives per HPI.  Past Medical History:  Diagnosis Date   Allergic rhinitis    gets shots per Dr. Fleeta Smock    Allergy    Anemia 2001   Anginal pain    Arthritis    Asthma    Cataract    bilaterally, surgically removed   Chronic kidney disease 09/30/2023   stage 3   COPD (chronic obstructive pulmonary disease) (HCC)    CXR just showed mild COPD (10/24/2011)   Coronary artery disease    had MI in 2001, seees Dr. Darron at [facility]   GERD (gastroesophageal reflux disease)    Gynecological examination    sees Dr. Norval    History of cardiac catheterization 07/24/2007   showed nonconclusive disease   Hyperlipidemia    Hypertension    Hypothyroidism    Migraines    have a history of migraines; haven't had one for years (10/24/2011)   Myocardial infarction [facility]) 2001?   in her 65's    Past Surgical History:  Procedure Laterality Date   CARDIAC CATHETERIZATION  2009   CESAREAN SECTION  1984   COLONOSCOPY  03/01/2020   per Dr. Abran,  adenomatous  polyps, repeat in 3 yrs   ESOPHAGOGASTRODUODENOSCOPY  05/29/2017   per Dr. Abran, normal except slight gastritis    HERNIA REPAIR  ~ 2007   ventral hernia repair   RIGHT/LEFT HEART CATH AND CORONARY ANGIOGRAPHY N/A 12/03/2021   Procedure: RIGHT/LEFT HEART CATH AND CORONARY ANGIOGRAPHY;  Surgeon: Cherrie Toribio SAUNDERS, MD;  Location: [facility] IN[facility];  Service: Cardiovascular;  Laterality: N/A;   THYROIDECTOMY  1990's   TONSILLECTOMY     when I was a kid   UPPER GASTROINTESTINAL ENDOSCOPY      Family History  Problem Relation Age of Onset   Pulmonary fibrosis Mother    COPD Mother    Breast cancer Mother        70's   Coronary artery disease Father    Hypertension Father    Colon polyps Paternal Grandfather    Colon cancer Paternal Grandfather    Cancer Other        breast,colon,prostate   Alcohol abuse Other    Hyperlipidemia Other    Hypertension Other    Stroke Other    Heart disease Other    COPD Other    Diabetes Other    Stomach cancer Other    Esophageal cancer Neg Hx    Rectal cancer Neg Hx      Current Outpatient  Medications:    albuterol  (PROVENTIL ) (2.5 MG/3ML) 0.083% nebulizer solution, INHALE 3 MLS (ONE VIAL) BY NEBULIZATION EVERY 4 HOURS AS NEEDED FOR WHEEZING OR SHORTNESS OF BREATH., Disp: 75 mL, Rfl: 11   albuterol  (VENTOLIN  HFA) 108 (90 Base) MCG/ACT inhaler, INHALE 2 PUFFS EVERY 4 HOURS AS NEEDED FOR WHEEZING OR SHORTNESS OF BREATH., Disp: 1 each, Rfl: 0   allopurinol  (ZYLOPRIM ) 300 MG tablet, TAKE 1 TABLET TWICE DAILY, Disp: 180 tablet, Rfl: 3   aspirin  81 MG tablet, Take 81 mg by mouth at bedtime. , Disp: , Rfl:    atorvastatin  (LIPITOR) 80 MG tablet, TAKE 1 TABLET EVERY DAY, Disp: 90 tablet, Rfl: 3   calcitRIOL  (ROCALTROL ) 0.25 MCG capsule, TAKE 1 CAPSULE TWICE DAILY, Disp: 180 capsule, Rfl: 3   calcium  citrate (CALCITRATE) 950 (200 Ca) MG tablet, Take 1 tablet (200 mg of elemental calcium  total) by mouth 3 (three) times daily. (Patient  taking differently: Take 200-400 mg of elemental calcium  by mouth See admin instructions. 200 mg in the morning, 400 mg in the evening), Disp: 3 tablet, Rfl: 0   cetirizine (ZYRTEC) 10 MG tablet, Take 10 mg by mouth daily., Disp: , Rfl:    colchicine  0.6 MG tablet, TAKE 1 TABLET BY MOUTH EVERY 6 (SIX) HOURS AS NEEDED FOR GOUT, Disp: 360 tablet, Rfl: 1   escitalopram  (LEXAPRO ) 20 MG tablet, TAKE 1 TABLET EVERY DAY, Disp: 90 tablet, Rfl: 3   fluticasone  (FLONASE ) 50 MCG/ACT nasal spray, Place 2 sprays into both nostrils daily., Disp: 16 g, Rfl: 0   furosemide  (LASIX ) 80 MG tablet, TAKE 1 TABLET EVERY DAY, Disp: 90 tablet, Rfl: 3   gabapentin  (NEURONTIN ) 300 MG capsule, TAKE 1 CAPSULE TWICE DAILY (NEED MD APPOINTMENT), Disp: 180 capsule, Rfl: 3   HYDROcodone -acetaminophen  (NORCO) 10-325 MG tablet, Take 1 tablet by mouth 2 (two) times daily as needed for moderate pain (pain score 4-6)., Disp: 60 tablet, Rfl: 0   JARDIANCE  10 MG TABS tablet, TAKE 1 TABLET EVERY DAY BEFORE BREAKFAST, Disp: 90 tablet, Rfl: 3   levothyroxine  (SYNTHROID ) 200 MCG tablet, TAKE 1 TABLET EVERY DAY, Disp: 90 tablet, Rfl: 3   magnesium  oxide (MAG-OX) 400 (240 Mg) MG tablet, Take 400 mg by mouth 2 (two) times daily., Disp: , Rfl:    metoprolol  tartrate (LOPRESSOR ) 25 MG tablet, TAKE 1 TABLET TWICE DAILY, Disp: 180 tablet, Rfl: 3   montelukast  (SINGULAIR ) 10 MG tablet, TAKE 1 TABLET AT BEDTIME, Disp: 90 tablet, Rfl: 3   Multiple Vitamin (MULTIVITAMIN) tablet, Take 1 tablet by mouth at bedtime., Disp: , Rfl:    omeprazole  (PRILOSEC) 40 MG capsule, TAKE 1 CAPSULE TWICE DAILY, Disp: 180 capsule, Rfl: 3   spironolactone  (ALDACTONE ) 25 MG tablet, TAKE 1 TABLET EVERY DAY, Disp: 90 tablet, Rfl: 3   temazepam  (RESTORIL ) 30 MG capsule, Take 1 capsule (30 mg total) by mouth at bedtime as needed for sleep., Disp: 90 capsule, Rfl: 1  EXAM:  VITALS per patient if applicable:  GENERAL: alert, oriented, appears well and in no acute  distress  HEENT: atraumatic, conjunttiva clear, no obvious abnormalities on inspection of external nose and ears  NECK: normal movements of the head and neck  LUNGS: on inspection no signs of respiratory distress, breathing rate appears normal, no obvious gross SOB, gasping or wheezing  CV: no obvious cyanosis  MS: moves all visible extremities without noticeable abnormality  PSYCH/NEURO: pleasant and cooperative, no obvious depression or anxiety, speech and thought processing grossly intact  ASSESSMENT AND PLAN:  Pain management. Indication for chronic opioid: low back pain Medication and dose: Norco 10-325 # pills per month: 90 Last UDS date: 08-15-23 Opioid Treatment Agreement signed (Y/N): 01-21-19 Opioid Treatment Agreement last reviewed with patient:  11-28-23 [facility] reviewed this encounter (include red flags): Yes We will increase the Norco to take 2 pills at bedtime and increase the Gabapentin  to take 2 pills at bedtime as well.  Garnette Olmsted, MD  Discussed the following assessment and plan:  No diagnosis found.     I discussed the assessment and treatment plan with the patient. The patient was provided an opportunity to ask questions and all were answered. The patient agreed with the plan and demonstrated an understanding of the instructions.   The patient was advised to call back or seek an in-person evaluation if the symptoms worsen or if the condition fails to improve as anticipated.      Review of Systems     Objective:   Physical Exam        Assessment & Plan:

## 2024-01-03 ENCOUNTER — Other Ambulatory Visit: Payer: Self-pay | Admitting: Family Medicine

## 2024-01-03 DIAGNOSIS — I10 Essential (primary) hypertension: Secondary | ICD-10-CM

## 2024-01-06 ENCOUNTER — Other Ambulatory Visit: Payer: Self-pay | Admitting: Family Medicine

## 2024-01-06 DIAGNOSIS — I10 Essential (primary) hypertension: Secondary | ICD-10-CM

## 2024-01-06 NOTE — Telephone Encounter (Unsigned)
 Copied from CRM [id number]. Topic: Clinical - Medication Refill >> Jan 06, 2024  1:43 PM Shardie S wrote: Medication: spironolactone  (ALDACTONE ) 25 MG tablet allopurinol  (ZYLOPRIM ) 300 MG tablet furosemide  (LASIX ) 80 MG tablet  Has the patient contacted their pharmacy? Yes (Agent: If no, request that the patient contact the pharmacy for the refill. If patient does not wish to contact the pharmacy document the reason why and proceed with request.) (Agent: If yes, when and what did the pharmacy advise?)  This is the patient's preferred pharmacy:   [facility] - [place], [place] - [address] [place] [address] Phone: [phone] Fax: [phone]  Is this the correct pharmacy for this prescription? Yes If no, delete pharmacy and type the correct one.   Has the prescription been filled recently? No  Is the patient out of the medication? No  Has the patient been seen for an appointment in the last year OR does the patient have an upcoming appointment? Yes  Can we respond through MyChart? No  Agent: Please be advised that Rx refills may take up to 3 business days. We ask that you follow-up with your pharmacy.

## 2024-01-07 MED ORDER — SPIRONOLACTONE 25 MG PO TABS: 25.0000 mg | ORAL_TABLET | Freq: Every day | ORAL | 3 refills | Status: AC

## 2024-01-07 MED ORDER — ALLOPURINOL 300 MG PO TABS: 300.0000 mg | ORAL_TABLET | Freq: Two times a day (BID) | ORAL | 3 refills | Status: AC

## 2024-01-07 MED ORDER — FUROSEMIDE 80 MG PO TABS: 80.0000 mg | ORAL_TABLET | Freq: Every day | ORAL | 3 refills | Status: AC

## 2024-02-01 ENCOUNTER — Other Ambulatory Visit: Payer: Self-pay | Admitting: Family Medicine

## 2024-02-11 LAB — LAB REPORT - SCANNED
A1c: 5.8
Creatinine, POC: 18.4 mg/dL
EGFR: 41

## 2024-03-02 ENCOUNTER — Other Ambulatory Visit: Payer: Self-pay | Admitting: Family Medicine

## 2024-03-03 ENCOUNTER — Other Ambulatory Visit (HOSPITAL_COMMUNITY): Payer: Self-pay

## 2024-03-04 ENCOUNTER — Other Ambulatory Visit (HOSPITAL_COMMUNITY): Payer: Self-pay

## 2024-03-04 MED ORDER — TEMAZEPAM 30 MG PO CAPS
30.0000 mg | ORAL_CAPSULE | Freq: Every evening | ORAL | 1 refills | Status: AC | PRN
  Filled 2024-03-04: qty 90, 90d supply, fill #0

## 2024-03-04 NOTE — Telephone Encounter (Signed)
 Patient called. Following up on med refill.

## 2024-04-19 ENCOUNTER — Ambulatory Visit
